# Patient Record
Sex: Female | Born: 1935
Health system: Southern US, Community
[De-identification: ages and names within clinical notes are randomized; demographics above are authoritative.]

## PROBLEM LIST (undated history)

## (undated) DIAGNOSIS — E039 Hypothyroidism, unspecified: Secondary | ICD-10-CM

## (undated) DIAGNOSIS — M1990 Unspecified osteoarthritis, unspecified site: Secondary | ICD-10-CM

## (undated) DIAGNOSIS — Z9889 Other specified postprocedural states: Secondary | ICD-10-CM

## (undated) DIAGNOSIS — I4891 Unspecified atrial fibrillation: Secondary | ICD-10-CM

## (undated) DIAGNOSIS — Z87442 Personal history of urinary calculi: Secondary | ICD-10-CM

## (undated) DIAGNOSIS — R112 Nausea with vomiting, unspecified: Secondary | ICD-10-CM

## (undated) DIAGNOSIS — I219 Acute myocardial infarction, unspecified: Secondary | ICD-10-CM

## (undated) HISTORY — PX: EYE SURGERY: SHX253

## (undated) HISTORY — PX: CATARACT EXTRACTION, BILATERAL: SHX1313

## (undated) HISTORY — PX: CHOLECYSTECTOMY: SHX55

## (undated) HISTORY — PX: TONSILLECTOMY: SUR1361

---

## 1982-10-26 HISTORY — PX: BREAST EXCISIONAL BIOPSY: SUR124

## 2005-04-02 ENCOUNTER — Ambulatory Visit: Payer: Self-pay | Admitting: Family Medicine

## 2005-12-02 ENCOUNTER — Ambulatory Visit: Payer: Self-pay | Admitting: Ophthalmology

## 2006-01-06 ENCOUNTER — Ambulatory Visit: Payer: Self-pay | Admitting: Ophthalmology

## 2006-07-19 ENCOUNTER — Ambulatory Visit: Payer: Self-pay | Admitting: Family Medicine

## 2007-09-05 ENCOUNTER — Ambulatory Visit: Payer: Self-pay | Admitting: Family Medicine

## 2008-09-14 ENCOUNTER — Ambulatory Visit: Payer: Self-pay

## 2008-09-17 ENCOUNTER — Ambulatory Visit: Payer: Self-pay | Admitting: Family Medicine

## 2008-10-26 HISTORY — PX: ROTATOR CUFF REPAIR: SHX139

## 2008-11-09 ENCOUNTER — Ambulatory Visit: Payer: Self-pay | Admitting: General Practice

## 2008-11-16 ENCOUNTER — Ambulatory Visit: Payer: Self-pay | Admitting: General Practice

## 2009-08-13 ENCOUNTER — Ambulatory Visit: Payer: Self-pay | Admitting: Ophthalmology

## 2009-08-26 ENCOUNTER — Ambulatory Visit: Payer: Self-pay | Admitting: Internal Medicine

## 2009-09-10 ENCOUNTER — Ambulatory Visit: Payer: Self-pay | Admitting: Internal Medicine

## 2009-09-23 ENCOUNTER — Ambulatory Visit: Payer: Self-pay | Admitting: Family Medicine

## 2009-09-25 ENCOUNTER — Ambulatory Visit: Payer: Self-pay | Admitting: Internal Medicine

## 2010-03-11 ENCOUNTER — Ambulatory Visit: Payer: Self-pay | Admitting: Family Medicine

## 2010-10-09 ENCOUNTER — Ambulatory Visit: Payer: Self-pay | Admitting: Family Medicine

## 2011-10-29 ENCOUNTER — Ambulatory Visit: Payer: Self-pay | Admitting: Family Medicine

## 2011-10-30 ENCOUNTER — Ambulatory Visit: Payer: Self-pay | Admitting: Family Medicine

## 2012-11-09 ENCOUNTER — Ambulatory Visit: Payer: Self-pay | Admitting: Family Medicine

## 2012-12-12 ENCOUNTER — Ambulatory Visit: Payer: Self-pay | Admitting: General Practice

## 2012-12-12 ENCOUNTER — Other Ambulatory Visit: Payer: Self-pay | Admitting: Anesthesiology

## 2012-12-12 LAB — CBC
HCT: 36 % (ref 35.0–47.0)
MCV: 90 fL (ref 80–100)
RBC: 3.98 10*6/uL (ref 3.80–5.20)
RDW: 13 % (ref 11.5–14.5)
WBC: 5.6 10*3/uL (ref 3.6–11.0)

## 2012-12-12 LAB — BASIC METABOLIC PANEL
Creatinine: 0.5 mg/dL — ABNORMAL LOW (ref 0.60–1.30)
EGFR (Non-African Amer.): >60
Glucose: 90 mg/dL (ref 65–99)

## 2012-12-12 LAB — SEDIMENTATION RATE: Erythrocyte Sed Rate: 16 mm/hr (ref 0–30)

## 2012-12-12 LAB — URINALYSIS, COMPLETE
Bacteria: NONE SEEN
Bilirubin,UR: NEGATIVE
Glucose,UR: NEGATIVE mg/dL (ref 0–75)
Ketone: NEGATIVE
Protein: NEGATIVE
Squamous Epithelial: <1
WBC UR: 2 /HPF (ref 0–5)

## 2012-12-12 LAB — APTT: Activated PTT: 32.3 secs (ref 23.6–35.9)

## 2012-12-12 LAB — PROTIME-INR: Prothrombin Time: 13.3 secs (ref 11.5–14.7)

## 2012-12-14 LAB — URINE CULTURE

## 2012-12-24 HISTORY — PX: JOINT REPLACEMENT: SHX530

## 2012-12-28 ENCOUNTER — Inpatient Hospital Stay: Payer: Self-pay | Admitting: General Practice

## 2012-12-29 LAB — BASIC METABOLIC PANEL
BUN: 10 mg/dL (ref 7–18)
Co2: 29 mmol/L (ref 21–32)
Creatinine: 0.67 mg/dL (ref 0.60–1.30)
EGFR (African American): >60
EGFR (Non-African Amer.): >60
Glucose: 115 mg/dL — ABNORMAL HIGH (ref 65–99)
Osmolality: 281 (ref 275–301)
Potassium: 3.9 mmol/L (ref 3.5–5.1)

## 2012-12-30 LAB — BASIC METABOLIC PANEL
Calcium, Total: 7.9 mg/dL — ABNORMAL LOW (ref 8.5–10.1)
Chloride: 105 mmol/L (ref 98–107)
Co2: 22 mmol/L (ref 21–32)
EGFR (African American): >60
Glucose: 84 mg/dL (ref 65–99)
Potassium: 3.1 mmol/L — ABNORMAL LOW (ref 3.5–5.1)

## 2012-12-30 LAB — PLATELET COUNT: Platelet: 106 10*3/uL — ABNORMAL LOW (ref 150–440)

## 2012-12-31 LAB — BASIC METABOLIC PANEL
BUN: 7 mg/dL (ref 7–18)
EGFR (African American): >60
EGFR (Non-African Amer.): >60
Glucose: 97 mg/dL (ref 65–99)
Osmolality: 275 (ref 275–301)
Potassium: 3.9 mmol/L (ref 3.5–5.1)
Sodium: 139 mmol/L (ref 136–145)

## 2013-01-02 ENCOUNTER — Encounter: Payer: Self-pay | Admitting: Internal Medicine

## 2013-11-10 ENCOUNTER — Ambulatory Visit: Payer: Self-pay | Admitting: Family Medicine

## 2014-03-06 DIAGNOSIS — E785 Hyperlipidemia, unspecified: Secondary | ICD-10-CM | POA: Insufficient documentation

## 2014-03-06 DIAGNOSIS — G2581 Restless legs syndrome: Secondary | ICD-10-CM | POA: Insufficient documentation

## 2014-11-19 ENCOUNTER — Ambulatory Visit: Payer: Self-pay | Admitting: Family Medicine

## 2014-12-11 ENCOUNTER — Emergency Department: Payer: Self-pay | Admitting: Emergency Medicine

## 2015-02-15 NOTE — Op Note (Signed)
PATIENT NAME:  Maria Trujillo, Maria Trujillo MR#:  [id number] DATE OF BIRTH:  1934/05/01  DATE OF PROCEDURE:  12/28/2012  PREOPERATIVE DIAGNOSIS: Degenerative arthrosis of the left knee.   POSTOPERATIVE DIAGNOSIS: Degenerative arthrosis of the left knee.   PROCEDURE PERFORMED: Left total knee arthroplasty using computer-assisted navigation.   SURGEON: Laurice Record. Hooten, MD.   ASSISTANTVance Peper, PA.   ANESTHESIA: Femoral nerve block and spinal.   ESTIMATED BLOOD LOSS: 100 mL.   FLUIDS REPLACED: 2200 mL of crystalloid.   DRAINS: Two medium drains to reinfusion system.   TOURNIQUET TIME: 97 minutes.   SOFT TISSUE RELEASES: Anterior cruciate ligament, posterior cruciate ligament, deep medial collateral ligament, patellofemoral ligament.   IMPLANTS UTILIZED: DePuy PFC Sigma size 4N (narrow) posterior stabilized femoral component (cemented), size 3 MBT tibial component (cemented), 35 mm 3-peg oval dome patella (cemented), and a 10 mm stabilized rotating platform polyethylene insert.   INDICATIONS FOR SURGERY: The patient is a 81 year old female who has been seen for complaints of progressive left knee pain. X-rays demonstrated severe degenerative changes in tricompartmental fashion with relative varus deformity. After discussion of the risks and benefits of surgical intervention, the patient expressed her understanding of the risks and benefits and agreed with plans for surgical intervention.   PROCEDURE IN DETAIL: The patient was brought into the Operating Room and after adequate femoral nerve block and spinal anesthesia was achieved, a tourniquet was placed on the patient's upper left thigh. The patient's left knee and leg were cleaned and prepped with alcohol and DuraPrep and draped in the usual sterile fashion. A "timeout" was performed as per usual protocol. The left lower extremity was exsanguinated using an Esmarch and the tourniquet was inflated to 300 mmHg. An anterior longitudinal incision was made  followed by a standard mid vastus approach. A moderate effusion was evacuated. The deep fibers of the medial collateral ligament were elevated in a subperiosteal fashion off the medial flare of the tibia so as to maintain a continuous soft tissue sleeve. The patella was subluxed laterally and the patellofemoral ligament was incised. Inspection of the knee demonstrated severe degenerative changes with full-thickness loss of articular cartilage, most notably to the medial compartment. Prominent osteophytes were debrided using a rongeur. Anterior and posterior cruciate ligaments were excised. Two 4.0 mm Schanz pins were inserted into the femur and into the tibia for attachment of the array of trackers used for computer-assisted navigation. Hip center was identified using a circumduction technique. Distal landmarks were mapped using the computer. The distal femur and proximal tibia were mapped using the computer. Distal femoral cutting guide was positioned using computer-assisted navigation so as to achieve a 5 degree distal valgus cut. Cut was performed and verified using the computer. The distal femur was sized and it was felt that a size 4N (narrow) femoral component was appropriate. A size 4 cutting guide was positioned and anterior cut was performed and verified using the computer. This was followed by completion of the posterior and chamfer cuts. Femoral cutting guide for central box was then positioned and the central box cut was performed.   Attention was then directed to the proximal tibia. Medial and lateral menisci were excised. The extramedullary tibial cutting guide was positioned using computer-assisted navigation so as to achieve 0 degree varus-valgus alignment and 0 degree posterior slope. Cut was performed and verified using the computer. The proximal tibia was sized and it was felt that a size 3 tibial tray was appropriate. Tibial and femoral trials were inserted  followed by insertion of a 10 mm  polyethylene insert. Excellent medial and lateral soft tissue balancing was appreciated both in extension and in flexion. Finally, the patella was cut and prepared so as to accommodate a 35 mm 3-peg oval dome patella. Patellar trial was placed and the knee was placed through a range of motion with excellent patellar tracking appreciated.   The femoral trial was removed after removal of posterior osteophytes. Central post hole for the tibial component was reamed followed by insertion of a keel punch. Tibial trial was then removed. Cut surfaces of bone were irrigated with copious amounts of normal saline with antibiotic solution using pulsatile lavage and then suctioned dry. Polymethylmethacrylate cement was prepared in the usual fashion using a vacuum mixer. Cement was applied to the cut surface of the proximal tibia as well as along the undersurface of a size 3 MBT tibial component. The tibial component was positioned and impacted into place. Excess cement was removed using [employer] fast streamer. Cement was then applied to the cut surface of the femur as well as on the posterior flanges of a size 4N (narrow) posterior stabilized femoral component. Femoral component was positioned and impacted into place. Excess cement was removed using [employer] fast streamer. A 10 mm polyethylene trial was inserted and the knee was brought into full extension with steady axial compression applied. Finally, cement was applied to the backside of a 35 mm 3- peg oval dome patella and the patellar component was positioned and patellar clamp applied. Excess cement was removed using [employer] fast streamer.   After adequate curing of cement, the tourniquet was deflated after a total tourniquet time of 97 minutes. Hemostasis was achieved using electrocautery. The knee was irrigated with copious amounts of normal saline with antibiotic solution using pulsatile lavage and then suctioned dry. The knee was inspected for any residual cement debris. Then, 30 mL  of 0.25% Marcaine with epinephrine was injected along the posterior capsule. A 10 mm stabilized rotating platform polyethylene insert was inserted and the knee was placed through a range of motion. Excellent patellar tracking was appreciated. Excellent medial and lateral soft tissue balance was appreciated. Two medium drains were placed in the wound bed and brought out through a separate stab incision to be attached to a reinfusion system. The medial parapatellar portion of the incision was reapproximated using interrupted sutures of #1 Vicryl. The subcutaneous tissue was reapproximated in layers using first #0 Vicryl followed by 2-0 Vicryl. The skin was closed with skin staples. A sterile dressing was applied.   The patient tolerated the procedure well. She was transported to the recovery room in stable condition.  ____________________________ Laurice Record. Holley Bouche., MD [email or web address] D: 12/28/2012 16:27:00 ET T: 12/28/2012 19:11:11 ET JOB#: [id number]  cc: Jeneen Rinks P. Holley Bouche., MD, <Dictator> Laurice Record Holley Bouche MD ELECTRONICALLY SIGNED 01/03/2013 18:22

## 2015-02-15 NOTE — Discharge Summary (Signed)
PATIENT NAME:  Maria Trujillo, Maria Trujillo MR#:  [id number] DATE OF BIRTH:  Feb 11, 1934  Dictated for Skip Estimable, MD  DATE OF ADMISSION:  12/28/2012 DATE OF DISCHARGE:  01/02/2013  ADMITTING DIAGNOSIS: Degenerative arthrosis of the left knee.   DISCHARGE DIAGNOSIS: Degenerative arthrosis of the left knee.   HISTORY: The patient is a very pleasant 81 year old female who has been followed at the [facility] for progression of left knee pain. The patient reported a relatively long history of progressive left knee and leg pain. She did not recall any specific trauma or aggravating event. She states the pain had become worse over the course of the last year. Her pain was noted to be aggravated with weightbearing activities, specifically with walking on uneven ground. The patient states that she saw some temporary improvement with her symptoms with the use of the a 6-day steroid Dosepak, but the pain returned after completion of the steroids. She had not seen any significant improvement in her condition despite nonsteroidal anti-inflammatory medications. The patient also reports swelling of the knee, as well as some near giving-way. She was having difficulty with ascending and descending stairs as well as getting up from a sitting position. At the time of surgery, she was not using any ambulatory aids. The patient states that the pain had increased to the point that it was significantly interfering with her activities of daily living. X-rays taken in [facility] showed narrowing in the medial cartilage space, with associated varus alignment. She was noted to have subchondral sclerosis, as well as osteophyte formation. After discussion of the risks and benefits of surgical intervention the patient expressed her understanding of the risks and benefits and agreed for plans for surgical intervention.   HOSPITAL COURSE:   PROCEDURE: Left total knee arthroplasty using computer-assisted navigation.    ANESTHESIA: Femoral nerve block, with spinal.   Soft tissue release: Anterior cruciate ligament, posterior cruciate ligament, deep medial collateral ligaments, as well as the patellofemoral ligament.   IMPLANTS UTILIZED: DePuy PFC Sigma size 4 narrow, posterior-stabilized femoral component (cemented), size 3 MBT tibial component (cemented), 35-mm 3-pegged oval dome patella (cemented), and a 10-mm  stabilized rotating platform polyethylene insert.   The patient tolerated the procedure very well. She had no complications. She was then taken to the PACU where she was stabilized, and then transferred to the orthopedic floor. The patient began receiving anticoagulation therapy of Lovenox 30 mg subQ q. 12 hours per anesthesia and pharmacy protocol. She was fitted with TED stockings bilaterally. These were allowed to be removed 1 hour per 8 hour shift. The left one was applied on day two following removal of the Hemovac and dressing change. The patient was also fitted with the AVI compression foot pumps to the bilateral feet, with pressure set at 80 mmHg. Her calves have been nontender. There has been no evidence of any DVTs. Negative Homan sign. Her heels were elevated off the bed using rolled towels.   The patient has denied any chest pain or any shortness of breath. Vital signs have been stable. She has been afebrile. Hemodynamically she was stable. No transfusions were given other than the Autovac transfusions given the first six hours postoperatively.   Physical therapy was initiated on day one for gait training and transfers. She has done extremely well. She is being discharged to a skilled nursing facility. Occupational therapy was also initiated on day one for ADLs and assistive devices.   The patient's IV, Foley and Hemovac were discontinued on  day 2. The wound was free of any drainage or signs of infection. Polar Care was reapplied to the surgical leg, maintaining a temperature of 40 to 50 degrees  Fahrenheit.   DRUG ALLERGIES: ALPHAGAN, AUGMENTIN, CELEBREX, PROPINE, SULFA DRUGS, TIMOPTIC, TRUSOPT, SHELLFISH.   MEDICATIONS:  Lumigan 0.01% optic solution, 1 drop, both eyes at bedtime, vitamin D 200 units, one tablet daily, Refresh optic drops 1 drop both eyes daily, vitamin D3 1000 units daily, Senokot-S  1 tablet twice daily, Synthroid 0.1 mg daily, multivitamin capsule one per day, omega-3 fatty acids 1 gram capsule daily, potassium chloride ER 40 mEq twice daily,  Zantac 150 mg twice daily, vitamin B complex with iron and intrinsic factor, 1 capsule twice daily with meals, Lovenox 30 mg subQ q. 12 hours for 14 days, then discontinue and begin taking one 81 mg enteric-coated aspirin, Tylenol ES 500 to 1000 mg q. 4  hours p.r.n. for pain and temperatures greater than 100.4, Norco 5/325, 1 to 2 tablets q. 4 to 6 hours as needed, for pain, Mylanta DS  30 mL q. 6 h., Dulcolax suppositories 10 mg rectally daily, as needed,  Zyrtec 10 mg daily,  Enema soapsuds if no results with Milk of Magnesia or Dulcolax.   PAST MEDICAL HISTORY: Hyperthyroidism, glaucoma, cataracts bilateral eyes, osteoporosis, anemia, fibrocystic breast disease.     ____________________________ Vance Peper, PA [email or web address] D: 01/02/2013 07:28:00 ET T: 01/02/2013 08:41:03 ET JOB#: [id number]  cc: Vance Peper, PA, <Dictator> JON WOLFE PA ELECTRONICALLY SIGNED 01/10/2013 20:11

## 2015-12-31 DIAGNOSIS — Z96652 Presence of left artificial knee joint: Secondary | ICD-10-CM | POA: Diagnosis not present

## 2016-02-14 DIAGNOSIS — H401132 Primary open-angle glaucoma, bilateral, moderate stage: Secondary | ICD-10-CM | POA: Diagnosis not present

## 2016-03-13 DIAGNOSIS — H401132 Primary open-angle glaucoma, bilateral, moderate stage: Secondary | ICD-10-CM | POA: Diagnosis not present

## 2016-04-17 ENCOUNTER — Other Ambulatory Visit: Payer: Self-pay | Admitting: Family Medicine

## 2016-04-17 DIAGNOSIS — G2581 Restless legs syndrome: Secondary | ICD-10-CM | POA: Diagnosis not present

## 2016-04-17 DIAGNOSIS — K219 Gastro-esophageal reflux disease without esophagitis: Secondary | ICD-10-CM | POA: Diagnosis not present

## 2016-04-17 DIAGNOSIS — Z1231 Encounter for screening mammogram for malignant neoplasm of breast: Secondary | ICD-10-CM

## 2016-04-17 DIAGNOSIS — E039 Hypothyroidism, unspecified: Secondary | ICD-10-CM | POA: Diagnosis not present

## 2016-04-17 DIAGNOSIS — J301 Allergic rhinitis due to pollen: Secondary | ICD-10-CM | POA: Diagnosis not present

## 2016-04-17 DIAGNOSIS — E7800 Pure hypercholesterolemia, unspecified: Secondary | ICD-10-CM | POA: Diagnosis not present

## 2016-05-11 ENCOUNTER — Ambulatory Visit: Payer: Self-pay

## 2016-05-26 ENCOUNTER — Ambulatory Visit
Admission: RE | Admit: 2016-05-26 | Discharge: 2016-05-26 | Disposition: A | Discharge status: Home / Self Care | Payer: PPO | Source: Ambulatory Visit | Attending: Family Medicine | Admitting: Family Medicine

## 2016-05-26 DIAGNOSIS — R928 Other abnormal and inconclusive findings on diagnostic imaging of breast: Secondary | ICD-10-CM | POA: Diagnosis not present

## 2016-05-26 DIAGNOSIS — Z1231 Encounter for screening mammogram for malignant neoplasm of breast: Secondary | ICD-10-CM | POA: Diagnosis not present

## 2016-05-29 ENCOUNTER — Other Ambulatory Visit: Payer: Self-pay | Admitting: Family Medicine

## 2016-05-29 DIAGNOSIS — N6320 Unspecified lump in the left breast, unspecified quadrant: Secondary | ICD-10-CM

## 2016-06-02 ENCOUNTER — Ambulatory Visit
Admission: RE | Admit: 2016-06-02 | Discharge: 2016-06-02 | Disposition: A | Discharge status: Home / Self Care | Payer: PPO | Source: Ambulatory Visit | Attending: Family Medicine | Admitting: Family Medicine

## 2016-06-02 DIAGNOSIS — N6320 Unspecified lump in the left breast, unspecified quadrant: Secondary | ICD-10-CM

## 2016-06-02 DIAGNOSIS — N6002 Solitary cyst of left breast: Secondary | ICD-10-CM | POA: Insufficient documentation

## 2016-06-02 DIAGNOSIS — R928 Other abnormal and inconclusive findings on diagnostic imaging of breast: Secondary | ICD-10-CM | POA: Diagnosis not present

## 2016-06-02 DIAGNOSIS — N63 Unspecified lump in breast: Secondary | ICD-10-CM | POA: Diagnosis not present

## 2016-06-24 DIAGNOSIS — Z08 Encounter for follow-up examination after completed treatment for malignant neoplasm: Secondary | ICD-10-CM | POA: Diagnosis not present

## 2016-06-24 DIAGNOSIS — L821 Other seborrheic keratosis: Secondary | ICD-10-CM | POA: Diagnosis not present

## 2016-06-24 DIAGNOSIS — Z85828 Personal history of other malignant neoplasm of skin: Secondary | ICD-10-CM | POA: Diagnosis not present

## 2016-07-14 DIAGNOSIS — H353131 Nonexudative age-related macular degeneration, bilateral, early dry stage: Secondary | ICD-10-CM | POA: Diagnosis not present

## 2016-07-17 DIAGNOSIS — Z23 Encounter for immunization: Secondary | ICD-10-CM | POA: Diagnosis not present

## 2016-10-27 DIAGNOSIS — G2581 Restless legs syndrome: Secondary | ICD-10-CM | POA: Diagnosis not present

## 2016-10-27 DIAGNOSIS — K219 Gastro-esophageal reflux disease without esophagitis: Secondary | ICD-10-CM | POA: Diagnosis not present

## 2016-10-27 DIAGNOSIS — E039 Hypothyroidism, unspecified: Secondary | ICD-10-CM | POA: Diagnosis not present

## 2016-10-27 DIAGNOSIS — E7800 Pure hypercholesterolemia, unspecified: Secondary | ICD-10-CM | POA: Diagnosis not present

## 2016-10-27 DIAGNOSIS — J301 Allergic rhinitis due to pollen: Secondary | ICD-10-CM | POA: Diagnosis not present

## 2016-10-27 DIAGNOSIS — M1711 Unilateral primary osteoarthritis, right knee: Secondary | ICD-10-CM | POA: Diagnosis not present

## 2017-01-05 DIAGNOSIS — H401132 Primary open-angle glaucoma, bilateral, moderate stage: Secondary | ICD-10-CM | POA: Diagnosis not present

## 2017-01-07 DIAGNOSIS — Z96652 Presence of left artificial knee joint: Secondary | ICD-10-CM | POA: Diagnosis not present

## 2017-01-07 DIAGNOSIS — M1711 Unilateral primary osteoarthritis, right knee: Secondary | ICD-10-CM | POA: Diagnosis not present

## 2017-01-12 DIAGNOSIS — H401132 Primary open-angle glaucoma, bilateral, moderate stage: Secondary | ICD-10-CM | POA: Diagnosis not present

## 2017-02-02 ENCOUNTER — Encounter
Admission: RE | Admit: 2017-02-02 | Discharge: 2017-02-02 | Disposition: A | Discharge status: Home / Self Care | Payer: PPO | Source: Ambulatory Visit | Attending: Orthopedic Surgery | Admitting: Orthopedic Surgery

## 2017-02-02 DIAGNOSIS — E039 Hypothyroidism, unspecified: Secondary | ICD-10-CM | POA: Diagnosis not present

## 2017-02-02 DIAGNOSIS — Z880 Allergy status to penicillin: Secondary | ICD-10-CM | POA: Insufficient documentation

## 2017-02-02 DIAGNOSIS — Z01810 Encounter for preprocedural cardiovascular examination: Secondary | ICD-10-CM | POA: Diagnosis not present

## 2017-02-02 DIAGNOSIS — R001 Bradycardia, unspecified: Secondary | ICD-10-CM | POA: Diagnosis not present

## 2017-02-02 DIAGNOSIS — Z882 Allergy status to sulfonamides status: Secondary | ICD-10-CM | POA: Insufficient documentation

## 2017-02-02 DIAGNOSIS — M1990 Unspecified osteoarthritis, unspecified site: Secondary | ICD-10-CM | POA: Diagnosis not present

## 2017-02-02 DIAGNOSIS — Z01812 Encounter for preprocedural laboratory examination: Secondary | ICD-10-CM | POA: Diagnosis not present

## 2017-02-02 HISTORY — DX: Hypothyroidism, unspecified: E03.9

## 2017-02-02 HISTORY — DX: Unspecified osteoarthritis, unspecified site: M19.90

## 2017-02-02 HISTORY — DX: Personal history of urinary calculi: Z87.442

## 2017-02-02 LAB — PROTIME-INR
INR: 1.06
Prothrombin Time: 13.8 seconds (ref 11.4–15.2)

## 2017-02-02 LAB — CBC
HCT: 38 % (ref 35.0–47.0)
Hemoglobin: 12.7 g/dL (ref 12.0–16.0)
MCH: 29.8 pg (ref 26.0–34.0)
MCHC: 33.4 g/dL (ref 32.0–36.0)
MCV: 89.4 fL (ref 80.0–100.0)
PLATELETS: 171 10*3/uL (ref 150–440)
RBC: 4.25 MIL/uL (ref 3.80–5.20)
RDW: 13 % (ref 11.5–14.5)
WBC: 5.3 10*3/uL (ref 3.6–11.0)

## 2017-02-02 LAB — COMPREHENSIVE METABOLIC PANEL
ALT: 19 U/L (ref 14–54)
ANION GAP: 5 (ref 5–15)
AST: 25 U/L (ref 15–41)
Albumin: 4.1 g/dL (ref 3.5–5.0)
Alkaline Phosphatase: 71 U/L (ref 38–126)
BILIRUBIN TOTAL: 0.7 mg/dL (ref 0.3–1.2)
BUN: 12 mg/dL (ref 6–20)
CHLORIDE: 106 mmol/L (ref 101–111)
CO2: 30 mmol/L (ref 22–32)
Calcium: 10 mg/dL (ref 8.9–10.3)
Creatinine, Ser: 0.51 mg/dL (ref 0.44–1.00)
GFR calc Af Amer: >60 mL/min (ref >60)
GFR calc non Af Amer: >60 mL/min (ref >60)
GLUCOSE: 90 mg/dL (ref 65–99)
POTASSIUM: 4 mmol/L (ref 3.5–5.1)
SODIUM: 141 mmol/L (ref 135–145)
TOTAL PROTEIN: 7.4 g/dL (ref 6.5–8.1)

## 2017-02-02 LAB — TYPE AND SCREEN
ABO/RH(D): O POS
ANTIBODY SCREEN: NEGATIVE

## 2017-02-02 LAB — URINALYSIS, ROUTINE W REFLEX MICROSCOPIC
BILIRUBIN URINE: NEGATIVE
Glucose, UA: NEGATIVE mg/dL
HGB URINE DIPSTICK: NEGATIVE
KETONES UR: NEGATIVE mg/dL
Leukocytes, UA: NEGATIVE
Nitrite: NEGATIVE
PH: 6 (ref 5.0–8.0)
Protein, ur: NEGATIVE mg/dL
SPECIFIC GRAVITY, URINE: 1.008 (ref 1.005–1.030)

## 2017-02-02 LAB — SEDIMENTATION RATE: SED RATE: 29 mm/h (ref 0–30)

## 2017-02-02 LAB — C-REACTIVE PROTEIN: CRP: <0.8 mg/dL (ref <1.0)

## 2017-02-02 LAB — SURGICAL PCR SCREEN
MRSA, PCR: NEGATIVE
Staphylococcus aureus: NEGATIVE

## 2017-02-02 LAB — APTT: APTT: 33 s (ref 24–36)

## 2017-02-02 NOTE — Patient Instructions (Signed)
Your procedure is scheduled on: February 15, 2017 [facility] Su procedimiento est programado para: Report to [place] a: To find out your arrival time please call  [phone] BETWEEN 1PM AND 3PM ON Friday February 12, 2017 Remember: Instructions that are not followed completely may result in serious medical risk, up to and including death, or upon the discretion of your surgeon and anesthesiologist your surgery may need to be rescheduled.  Recuerde: Las instrucciones que no se siguen completamente Heritage manager en un riesgo de salud grave, incluyendo hasta la [place] o a discrecin de su cirujano y Environmental health practitioner, su ciruga se puede posponer.   ___X_ 1. Do not eat food or drink liquids after midnight. No gum chewing or hard candies.  No coma alimentos ni tome lquidos despus de la medianoche.  No mastique chicle ni caramelos  duros.     __X__ 2. No alcohol for 24 hours before or after surgery.    No tome alcohol durante las 24 horas antes ni despus de la [place].   __X__ 3. Bring all medications with you on the day of surgery if instructed.  BRING ANY NEW MEDICATIONS    Lleve todos los medicamentos con usted el da de su ciruga si se le ha indicado as.   _X___ 4. Notify your doctor if there is any change in your medical condition (cold, fever,                             infections).    Informe a su mdico si hay algn cambio en su condicin mdica (resfriado, fiebre, infecciones).   Do not wear jewelry, make-up, hairpins, clips or nail polish.  No use joyas, maquillajes, pinzas/ganchos para el cabello ni esmalte de uas.  Do not wear lotions, powders, or perfumes. You may NOT wear deodorant.  No use lociones, polvos o perfumes.  Puede usar desodorante.    Do not shave 48 hours prior to surgery. Men may shave face and neck.  No se afeite 48 horas antes de la [place].  Los hombres pueden [employer] cara y el cuello.   Do not bring valuables  to the hospital.   No lleve objetos [facility] is not responsible for any belongings or valuables.   no se hace responsable de ningn tipo de pertenencias u objetos de Geographical information systems officer.               Contacts, dentures or bridgework may not be worn into surgery.  Los lentes de [place], las dentaduras postizas o puentes no se pueden usar en la [place].  Leave your suitcase in the car. After surgery it may be brought to your room.  Deje su maleta en el auto.  Despus de la ciruga podr traerla a su habitacin.  For patients admitted to the hospital, discharge time is determined by your treatment team.  Para los pacientes que sean ingresados al hospital, el tiempo en el cual se le dar de alta es determinado por su                equipo de [place].   Patients discharged the day of surgery will not be allowed to drive home. A los pacientes que se les da de alta el mismo da de la ciruga no se les permitir conducir a Holiday representative.   Please read over the following fact sheets that you were  given: Por favor lea las siguientes hojas de informacin que le dieron:   MRSA EDUCATION AND CHG INSTRUCTIONS   _X___ Take these medicines the morning of surgery with A SIP OF WATER:          [employer] estas medicinas la maana de la ciruga con UN SORBO DE AGUA:  1. LEVOTHYROXINE  2. CLARITIN  3.   4.       5.  6.  ____ Fleet Enema (as directed)          Enema de Fleet (segn lo indicado)    _X___ Use CHG Soap as directed          Utilice el jabn de CHG segn lo indicado  ____ Use inhalers on the day of surgery          Use los inhaladores el da de la ciruga  ____ Stop metformin 2 days prior to surgery          Deje de tomar el metformin 2 das antes de la ciruga    ____ Take 1/2 of usual insulin dose the night before surgery and none on the morning of surgery           Tome la mitad de la dosis habitual de insulina la noche antes de la [place] y no tome nada en la maana  de la             ciruga  __X__ Stop Coumadin/Plavix/aspirin on February 10, 2017          Palomas de tomar el Coumadin/Plavix/aspirina el da:  __X__ Stop Anti-inflammatories on February 10, 2017 ALEVE, ADVIL, MOTRIN, IBUPROFEN, GOODY POWDER USE  TYLENOL ONLY           Deje de tomar antiinflamatorios el da:   __X__ Stop supplements until after surgery  February 10, 2017 - FISH OIL          Deje de tomar suplementos hasta despus de la ciruga  ____ Bring C-Pap to the hospital          [facility]

## 2017-02-03 LAB — URINE CULTURE
Culture: <10000 — AB
Special Requests: NORMAL

## 2017-02-12 NOTE — Pre-Procedure Instructions (Signed)
Faxed request to Dr. Clydell Hakim office for H&P. DOS 02/15/17.

## 2017-02-14 MED ORDER — TRANEXAMIC ACID 1000 MG/10ML IV SOLN
1000.0000 mg | INTRAVENOUS | Status: DC
  Filled 2017-02-14: qty 10

## 2017-02-14 MED ORDER — CLINDAMYCIN PHOSPHATE 900 MG/50ML IV SOLN: 900.0000 mg | INTRAVENOUS | Status: DC

## 2017-02-15 ENCOUNTER — Inpatient Hospital Stay: Payer: PPO | Admitting: Anesthesiology

## 2017-02-15 ENCOUNTER — Inpatient Hospital Stay
Admission: RE | Admit: 2017-02-15 | Discharge: 2017-02-17 | DRG: 470 | Disposition: A | Discharge status: Skilled Nursing Facility | Payer: PPO | Source: Ambulatory Visit | Attending: Orthopedic Surgery | Admitting: Orthopedic Surgery

## 2017-02-15 ENCOUNTER — Encounter: Payer: Self-pay | Admitting: Orthopedic Surgery

## 2017-02-15 ENCOUNTER — Inpatient Hospital Stay: Payer: PPO

## 2017-02-15 ENCOUNTER — Encounter
Admission: RE | Disposition: A | Discharge status: Skilled Nursing Facility | Payer: Self-pay | Source: Ambulatory Visit | Attending: Orthopedic Surgery

## 2017-02-15 DIAGNOSIS — E785 Hyperlipidemia, unspecified: Secondary | ICD-10-CM | POA: Diagnosis not present

## 2017-02-15 DIAGNOSIS — I10 Essential (primary) hypertension: Secondary | ICD-10-CM | POA: Insufficient documentation

## 2017-02-15 DIAGNOSIS — M6281 Muscle weakness (generalized): Secondary | ICD-10-CM | POA: Diagnosis not present

## 2017-02-15 DIAGNOSIS — Z87442 Personal history of urinary calculi: Secondary | ICD-10-CM

## 2017-02-15 DIAGNOSIS — H409 Unspecified glaucoma: Secondary | ICD-10-CM | POA: Diagnosis not present

## 2017-02-15 DIAGNOSIS — M25561 Pain in right knee: Secondary | ICD-10-CM | POA: Diagnosis not present

## 2017-02-15 DIAGNOSIS — M1711 Unilateral primary osteoarthritis, right knee: Principal | ICD-10-CM | POA: Diagnosis present

## 2017-02-15 DIAGNOSIS — Z7401 Bed confinement status: Secondary | ICD-10-CM | POA: Diagnosis not present

## 2017-02-15 DIAGNOSIS — R262 Difficulty in walking, not elsewhere classified: Secondary | ICD-10-CM | POA: Diagnosis not present

## 2017-02-15 DIAGNOSIS — E039 Hypothyroidism, unspecified: Secondary | ICD-10-CM | POA: Diagnosis present

## 2017-02-15 DIAGNOSIS — K219 Gastro-esophageal reflux disease without esophagitis: Secondary | ICD-10-CM | POA: Diagnosis present

## 2017-02-15 DIAGNOSIS — Z96651 Presence of right artificial knee joint: Secondary | ICD-10-CM

## 2017-02-15 DIAGNOSIS — Z96659 Presence of unspecified artificial knee joint: Secondary | ICD-10-CM

## 2017-02-15 DIAGNOSIS — J309 Allergic rhinitis, unspecified: Secondary | ICD-10-CM | POA: Diagnosis not present

## 2017-02-15 DIAGNOSIS — Z471 Aftercare following joint replacement surgery: Secondary | ICD-10-CM | POA: Diagnosis not present

## 2017-02-15 HISTORY — PX: KNEE ARTHROPLASTY: SHX992

## 2017-02-15 LAB — ABO/RH: ABO/RH(D): O POS

## 2017-02-15 SURGERY — ARTHROPLASTY, KNEE, TOTAL, USING IMAGELESS COMPUTER-ASSISTED NAVIGATION
Anesthesia: Spinal | Site: Knee | Laterality: Right | Wound class: Clean

## 2017-02-15 MED ORDER — PHENYLEPHRINE HCL 10 MG PO TABS
10.0000 mg | ORAL_TABLET | ORAL | Status: DC | PRN
  Filled 2017-02-15: qty 1

## 2017-02-15 MED ORDER — OMEGA-3-ACID ETHYL ESTERS 1 G PO CAPS
1000.0000 mg | ORAL_CAPSULE | Freq: Every day | ORAL | Status: DC
  Administered 2017-02-16: 1000 mg via ORAL
  Filled 2017-02-15 (×2): qty 1

## 2017-02-15 MED ORDER — BUPIVACAINE HCL (PF) 0.25 % IJ SOLN
INTRAMUSCULAR | Status: DC | PRN
  Administered 2017-02-15: 60 mL

## 2017-02-15 MED ORDER — PANTOPRAZOLE SODIUM 40 MG PO TBEC
40.0000 mg | DELAYED_RELEASE_TABLET | Freq: Two times a day (BID) | ORAL | Status: DC
  Administered 2017-02-15 – 2017-02-17 (×4): 40 mg via ORAL
  Filled 2017-02-15 (×4): qty 1

## 2017-02-15 MED ORDER — LIDOCAINE HCL (PF) 2 % IJ SOLN
INTRAMUSCULAR | Status: AC
  Filled 2017-02-15: qty 2

## 2017-02-15 MED ORDER — ACETAMINOPHEN 325 MG PO TABS
650.0000 mg | ORAL_TABLET | Freq: Four times a day (QID) | ORAL | Status: DC | PRN
  Administered 2017-02-17: 650 mg via ORAL
  Filled 2017-02-15: qty 2

## 2017-02-15 MED ORDER — BUPIVACAINE HCL (PF) 0.5 % IJ SOLN
INTRAMUSCULAR | Status: DC | PRN
  Administered 2017-02-15: 2 mL

## 2017-02-15 MED ORDER — ONDANSETRON HCL 4 MG/2ML IJ SOLN
INTRAMUSCULAR | Status: AC
  Filled 2017-02-15: qty 2

## 2017-02-15 MED ORDER — LEVOTHYROXINE SODIUM 100 MCG PO TABS
100.0000 ug | ORAL_TABLET | Freq: Every day | ORAL | Status: DC
  Administered 2017-02-16 – 2017-02-17 (×2): 100 ug via ORAL
  Filled 2017-02-15 (×2): qty 1

## 2017-02-15 MED ORDER — ACETAMINOPHEN 650 MG RE SUPP: 650.0000 mg | Freq: Four times a day (QID) | RECTAL | Status: DC | PRN

## 2017-02-15 MED ORDER — LORATADINE 10 MG PO TABS
10.0000 mg | ORAL_TABLET | Freq: Every day | ORAL | Status: DC
  Administered 2017-02-16 – 2017-02-17 (×2): 10 mg via ORAL
  Filled 2017-02-15 (×2): qty 1

## 2017-02-15 MED ORDER — PHENOL 1.4 % MT LIQD
1.0000 | OROMUCOSAL | Status: DC | PRN
  Filled 2017-02-15: qty 177

## 2017-02-15 MED ORDER — BISACODYL 10 MG RE SUPP: 10.0000 mg | Freq: Every day | RECTAL | Status: DC | PRN

## 2017-02-15 MED ORDER — BUPIVACAINE HCL (PF) 0.25 % IJ SOLN
INTRAMUSCULAR | Status: AC
  Filled 2017-02-15: qty 30

## 2017-02-15 MED ORDER — BUPIVACAINE LIPOSOME 1.3 % IJ SUSP
INTRAMUSCULAR | Status: AC
  Filled 2017-02-15: qty 20

## 2017-02-15 MED ORDER — CLINDAMYCIN PHOSPHATE 600 MG/50ML IV SOLN
600.0000 mg | Freq: Four times a day (QID) | INTRAVENOUS | Status: AC
  Administered 2017-02-15 – 2017-02-16 (×4): 600 mg via INTRAVENOUS
  Filled 2017-02-15 (×4): qty 50

## 2017-02-15 MED ORDER — SODIUM CHLORIDE 0.9 % IV SOLN
INTRAVENOUS | Status: DC | PRN
  Administered 2017-02-15: 60 mL

## 2017-02-15 MED ORDER — PROPOFOL 500 MG/50ML IV EMUL
INTRAVENOUS | Status: AC
  Filled 2017-02-15: qty 50

## 2017-02-15 MED ORDER — SENNOSIDES-DOCUSATE SODIUM 8.6-50 MG PO TABS
1.0000 | ORAL_TABLET | Freq: Two times a day (BID) | ORAL | Status: DC
  Administered 2017-02-15 – 2017-02-17 (×4): 1 via ORAL
  Filled 2017-02-15 (×4): qty 1

## 2017-02-15 MED ORDER — FAMOTIDINE 20 MG PO TABS
20.0000 mg | ORAL_TABLET | Freq: Once | ORAL | Status: AC
  Administered 2017-02-15: 20 mg via ORAL

## 2017-02-15 MED ORDER — ENOXAPARIN SODIUM 30 MG/0.3ML ~~LOC~~ SOLN
30.0000 mg | Freq: Two times a day (BID) | SUBCUTANEOUS | Status: DC
  Administered 2017-02-16 – 2017-02-17 (×3): 30 mg via SUBCUTANEOUS
  Filled 2017-02-15 (×3): qty 0.3

## 2017-02-15 MED ORDER — ONDANSETRON HCL 4 MG PO TABS: 4.0000 mg | ORAL_TABLET | Freq: Four times a day (QID) | ORAL | Status: DC | PRN

## 2017-02-15 MED ORDER — TRAMADOL HCL 50 MG PO TABS
50.0000 mg | ORAL_TABLET | ORAL | Status: DC | PRN
  Administered 2017-02-16 – 2017-02-17 (×6): 50 mg via ORAL
  Filled 2017-02-15 (×6): qty 1

## 2017-02-15 MED ORDER — MORPHINE SULFATE (PF) 2 MG/ML IV SOLN: 2.0000 mg | INTRAVENOUS | Status: DC | PRN

## 2017-02-15 MED ORDER — MAGNESIUM HYDROXIDE 400 MG/5ML PO SUSP
30.0000 mL | Freq: Every day | ORAL | Status: DC | PRN
  Administered 2017-02-16: 30 mL via ORAL
  Filled 2017-02-15: qty 30

## 2017-02-15 MED ORDER — TRANEXAMIC ACID 1000 MG/10ML IV SOLN
INTRAVENOUS | Status: DC | PRN
  Administered 2017-02-15: 1000 mg via INTRAVENOUS

## 2017-02-15 MED ORDER — NEOMYCIN-POLYMYXIN B GU 40-200000 IR SOLN
Status: DC | PRN
  Administered 2017-02-15: 14 mL

## 2017-02-15 MED ORDER — OCUVITE-LUTEIN PO CAPS
2.0000 | ORAL_CAPSULE | Freq: Two times a day (BID) | ORAL | Status: DC
  Administered 2017-02-15 – 2017-02-17 (×4): 2 via ORAL
  Filled 2017-02-15 (×5): qty 2

## 2017-02-15 MED ORDER — LATANOPROST 0.005 % OP SOLN
1.0000 [drp] | Freq: Every day | OPHTHALMIC | Status: DC
  Administered 2017-02-15 – 2017-02-16 (×2): 1 [drp] via OPHTHALMIC
  Filled 2017-02-15: qty 2.5

## 2017-02-15 MED ORDER — TETRACAINE HCL 1 % IJ SOLN
INTRAMUSCULAR | Status: DC | PRN
  Administered 2017-02-15: 10 mg via INTRASPINAL

## 2017-02-15 MED ORDER — MENTHOL 3 MG MT LOZG
1.0000 | LOZENGE | OROMUCOSAL | Status: DC | PRN
  Filled 2017-02-15: qty 9

## 2017-02-15 MED ORDER — CHLORHEXIDINE GLUCONATE 4 % EX LIQD: 60.0000 mL | Freq: Once | CUTANEOUS | Status: DC

## 2017-02-15 MED ORDER — NEOMYCIN-POLYMYXIN B GU 40-200000 IR SOLN
Status: AC
  Filled 2017-02-15: qty 20

## 2017-02-15 MED ORDER — ACETAMINOPHEN 10 MG/ML IV SOLN
INTRAVENOUS | Status: DC | PRN
  Administered 2017-02-15: 1000 mg via INTRAVENOUS

## 2017-02-15 MED ORDER — FENTANYL CITRATE (PF) 100 MCG/2ML IJ SOLN: 25.0000 ug | INTRAMUSCULAR | Status: DC | PRN

## 2017-02-15 MED ORDER — SODIUM CHLORIDE 0.9 % IJ SOLN
INTRAMUSCULAR | Status: AC
  Administered 2017-02-15: 17:00:00
  Filled 2017-02-15: qty 20

## 2017-02-15 MED ORDER — OXYCODONE HCL 5 MG PO TABS
5.0000 mg | ORAL_TABLET | ORAL | Status: DC | PRN
  Administered 2017-02-15: 5 mg via ORAL
  Filled 2017-02-15: qty 1

## 2017-02-15 MED ORDER — PHENYLEPHRINE HCL 10 MG/ML IJ SOLN
INTRAMUSCULAR | Status: DC | PRN
  Administered 2017-02-15: 30 ug/min via INTRAVENOUS

## 2017-02-15 MED ORDER — PROPOFOL 10 MG/ML IV BOLUS
INTRAVENOUS | Status: DC | PRN
  Administered 2017-02-15: 8 mg via INTRAVENOUS
  Administered 2017-02-15: 8.3 mg via INTRAVENOUS
  Administered 2017-02-15 (×3): 8 mg via INTRAVENOUS
  Administered 2017-02-15 (×2): 20 mg via INTRAVENOUS

## 2017-02-15 MED ORDER — FERROUS SULFATE 325 (65 FE) MG PO TABS
325.0000 mg | ORAL_TABLET | Freq: Two times a day (BID) | ORAL | Status: DC
  Administered 2017-02-16 – 2017-02-17 (×3): 325 mg via ORAL
  Filled 2017-02-15 (×3): qty 1

## 2017-02-15 MED ORDER — FENTANYL CITRATE (PF) 100 MCG/2ML IJ SOLN
INTRAMUSCULAR | Status: DC | PRN
  Administered 2017-02-15: 100 ug via INTRAVENOUS

## 2017-02-15 MED ORDER — CLINDAMYCIN PHOSPHATE 900 MG/50ML IV SOLN
INTRAVENOUS | Status: AC
  Filled 2017-02-15: qty 50

## 2017-02-15 MED ORDER — PROMETHAZINE HCL 25 MG/ML IJ SOLN
INTRAMUSCULAR | Status: AC
  Administered 2017-02-15: 6.25 mg via INTRAVENOUS
  Filled 2017-02-15: qty 1

## 2017-02-15 MED ORDER — DIPHENHYDRAMINE HCL 12.5 MG/5ML PO ELIX
12.5000 mg | ORAL_SOLUTION | ORAL | Status: DC | PRN
Start: 2017-02-15 — End: 2017-02-17

## 2017-02-15 MED ORDER — FAMOTIDINE 20 MG PO TABS
ORAL_TABLET | ORAL | Status: AC
  Administered 2017-02-15: 20 mg via ORAL
  Filled 2017-02-15: qty 1

## 2017-02-15 MED ORDER — ALUM & MAG HYDROXIDE-SIMETH 200-200-20 MG/5ML PO SUSP: 30.0000 mL | ORAL | Status: DC | PRN

## 2017-02-15 MED ORDER — ACETAMINOPHEN 10 MG/ML IV SOLN
1000.0000 mg | Freq: Four times a day (QID) | INTRAVENOUS | Status: AC
  Administered 2017-02-15 – 2017-02-16 (×4): 1000 mg via INTRAVENOUS
  Filled 2017-02-15 (×4): qty 100

## 2017-02-15 MED ORDER — TRANEXAMIC ACID 1000 MG/10ML IV SOLN
1000.0000 mg | Freq: Once | INTRAVENOUS | Status: AC
  Administered 2017-02-15: 1000 mg via INTRAVENOUS
  Filled 2017-02-15: qty 10

## 2017-02-15 MED ORDER — VITAMIN D 1000 UNITS PO TABS
1000.0000 [IU] | ORAL_TABLET | Freq: Every day | ORAL | Status: DC
  Administered 2017-02-16 – 2017-02-17 (×2): 1000 [IU] via ORAL
  Filled 2017-02-15 (×2): qty 1

## 2017-02-15 MED ORDER — ACETAMINOPHEN 10 MG/ML IV SOLN
INTRAVENOUS | Status: AC
  Filled 2017-02-15: qty 100

## 2017-02-15 MED ORDER — LACTATED RINGERS IV SOLN
INTRAVENOUS | Status: DC
  Administered 2017-02-15 (×2): via INTRAVENOUS

## 2017-02-15 MED ORDER — SODIUM CHLORIDE 0.9 % IV BOLUS (SEPSIS)
250.0000 mL | Freq: Once | INTRAVENOUS | Status: AC
  Administered 2017-02-15: 250 mL via INTRAVENOUS

## 2017-02-15 MED ORDER — FLEET ENEMA 7-19 GM/118ML RE ENEM: 1.0000 | ENEMA | Freq: Once | RECTAL | Status: DC | PRN

## 2017-02-15 MED ORDER — POLYVINYL ALCOHOL 1.4 % OP SOLN
1.0000 [drp] | Freq: Two times a day (BID) | OPHTHALMIC | Status: DC | PRN
  Filled 2017-02-15: qty 15

## 2017-02-15 MED ORDER — FENTANYL CITRATE (PF) 100 MCG/2ML IJ SOLN
INTRAMUSCULAR | Status: AC
  Filled 2017-02-15: qty 2

## 2017-02-15 MED ORDER — SODIUM CHLORIDE 0.9 % IV SOLN
INTRAVENOUS | Status: DC
  Administered 2017-02-16: 03:00:00 via INTRAVENOUS

## 2017-02-15 MED ORDER — ADULT MULTIVITAMIN W/MINERALS CH
1.0000 | ORAL_TABLET | Freq: Every day | ORAL | Status: DC
  Administered 2017-02-16 – 2017-02-17 (×2): 1 via ORAL
  Filled 2017-02-15 (×2): qty 1

## 2017-02-15 MED ORDER — CALCIUM CARBONATE-VITAMIN D 500-200 MG-UNIT PO TABS
1.0000 | ORAL_TABLET | Freq: Every day | ORAL | Status: DC
  Administered 2017-02-16 – 2017-02-17 (×2): 1 via ORAL
  Filled 2017-02-15 (×2): qty 1

## 2017-02-15 MED ORDER — CLINDAMYCIN PHOSPHATE 900 MG/50ML IV SOLN
INTRAVENOUS | Status: DC | PRN
  Administered 2017-02-15: 900 mg via INTRAVENOUS

## 2017-02-15 MED ORDER — KETOTIFEN FUMARATE 0.025 % OP SOLN
1.0000 [drp] | Freq: Two times a day (BID) | OPHTHALMIC | Status: DC | PRN
  Filled 2017-02-15: qty 5

## 2017-02-15 MED ORDER — ONDANSETRON HCL 4 MG/2ML IJ SOLN: 4.0000 mg | Freq: Four times a day (QID) | INTRAMUSCULAR | Status: DC | PRN

## 2017-02-15 MED ORDER — ONDANSETRON HCL 4 MG/2ML IJ SOLN
4.0000 mg | Freq: Once | INTRAMUSCULAR | Status: AC | PRN
  Administered 2017-02-15: 4 mg via INTRAVENOUS

## 2017-02-15 MED ORDER — SALINE SPRAY 0.65 % NA SOLN
1.0000 | NASAL | Status: DC | PRN
  Filled 2017-02-15: qty 44

## 2017-02-15 MED ORDER — PROPOFOL 500 MG/50ML IV EMUL
INTRAVENOUS | Status: DC | PRN
  Administered 2017-02-15: 75 ug/kg/min via INTRAVENOUS

## 2017-02-15 MED ORDER — METOCLOPRAMIDE HCL 10 MG PO TABS
10.0000 mg | ORAL_TABLET | Freq: Three times a day (TID) | ORAL | Status: DC
  Administered 2017-02-15 – 2017-02-17 (×6): 10 mg via ORAL
  Filled 2017-02-15 (×6): qty 1

## 2017-02-15 MED ORDER — PROMETHAZINE HCL 25 MG/ML IJ SOLN
6.2500 mg | Freq: Once | INTRAMUSCULAR | Status: AC
Start: 2017-02-15 — End: 2017-02-15
  Administered 2017-02-15: 6.25 mg via INTRAVENOUS

## 2017-02-15 MED ORDER — SODIUM CHLORIDE 0.9 % IJ SOLN
INTRAMUSCULAR | Status: AC
  Filled 2017-02-15: qty 50

## 2017-02-15 SURGICAL SUPPLY — 70 items
BATTERY INSTRU NAVIGATION (MISCELLANEOUS) ×12 IMPLANT
BLADE CLIPPER SURG (BLADE) ×3 IMPLANT
BLADE SAW 1 (BLADE) ×3 IMPLANT
BLADE SAW 1/2 (BLADE) ×3 IMPLANT
BLADE SAW 70X12.5 (BLADE) IMPLANT
BONE CEMENT GENTAMICIN (Cement) ×6 IMPLANT
CANISTER SUCT 1200ML W/VALVE (MISCELLANEOUS) ×3 IMPLANT
CANISTER SUCT 3000ML (MISCELLANEOUS) ×6 IMPLANT
CAP KNEE TOTAL 3 SIGMA ×3 IMPLANT
CATH TRAY METER 16FR LF (MISCELLANEOUS) ×3 IMPLANT
CEMENT BONE GENTAMICIN 40 (Cement) ×2 IMPLANT
CHLORAPREP W/TINT 26ML (MISCELLANEOUS) ×6 IMPLANT
COOLER POLAR GLACIER W/PUMP (MISCELLANEOUS) ×3 IMPLANT
CUFF TOURN 24 STER (MISCELLANEOUS) IMPLANT
CUFF TOURN 30 STER DUAL PORT (MISCELLANEOUS) ×3 IMPLANT
DECANTER SPIKE VIAL GLASS SM (MISCELLANEOUS) ×9 IMPLANT
DRAPE INCISE 23X17 IOBAN STRL (DRAPES) ×2
DRAPE INCISE IOBAN 23X17 STRL (DRAPES) ×1 IMPLANT
DRAPE SHEET LG 3/4 BI-LAMINATE (DRAPES) ×3 IMPLANT
DRSG DERMACEA 8X12 NADH (GAUZE/BANDAGES/DRESSINGS) ×3 IMPLANT
DRSG OPSITE POSTOP 4X14 (GAUZE/BANDAGES/DRESSINGS) ×3 IMPLANT
DRSG TEGADERM 4X4.75 (GAUZE/BANDAGES/DRESSINGS) ×3 IMPLANT
DURAPREP 26ML APPLICATOR (WOUND CARE) IMPLANT
ELECT CAUTERY BLADE 6.4 (BLADE) ×3 IMPLANT
ELECT REM PT RETURN 9FT ADLT (ELECTROSURGICAL) ×3
ELECTRODE REM PT RTRN 9FT ADLT (ELECTROSURGICAL) ×1 IMPLANT
EX-PIN ORTHOLOCK NAV 4X150 (PIN) ×6 IMPLANT
GLOVE BIOGEL M STRL SZ7.5 (GLOVE) ×12 IMPLANT
GLOVE BIOGEL PI IND STRL 9 (GLOVE) ×1 IMPLANT
GLOVE BIOGEL PI INDICATOR 9 (GLOVE) ×2
GLOVE INDICATOR 8.0 STRL GRN (GLOVE) ×9 IMPLANT
GLOVE SURG SYN 9.0  PF PI (GLOVE) ×6
GLOVE SURG SYN 9.0 PF PI (GLOVE) ×3 IMPLANT
GOWN STRL REUS W/ TWL LRG LVL3 (GOWN DISPOSABLE) ×3 IMPLANT
GOWN STRL REUS W/TWL 2XL LVL3 (GOWN DISPOSABLE) ×3 IMPLANT
GOWN STRL REUS W/TWL LRG LVL3 (GOWN DISPOSABLE) ×6
HEMOVAC 400CC 10FR (MISCELLANEOUS) ×3 IMPLANT
HOLDER FOLEY CATH W/STRAP (MISCELLANEOUS) ×3 IMPLANT
HOOD PEEL AWAY FLYTE STAYCOOL (MISCELLANEOUS) ×6 IMPLANT
KIT RM TURNOVER STRD PROC AR (KITS) ×3 IMPLANT
KNIFE SCULPS 14X20 (INSTRUMENTS) ×3 IMPLANT
LABEL OR SOLS (LABEL) ×3 IMPLANT
NDL SAFETY 18GX1.5 (NEEDLE) ×3 IMPLANT
NEEDLE SPNL 20GX3.5 QUINCKE YW (NEEDLE) ×3 IMPLANT
NS IRRIG 500ML POUR BTL (IV SOLUTION) ×3 IMPLANT
PACK TOTAL KNEE (MISCELLANEOUS) ×3 IMPLANT
PAD WRAPON POLAR KNEE (MISCELLANEOUS) ×1 IMPLANT
PIN DRILL QUICK PACK ×3 IMPLANT
PIN FIXATION 1/8DIA X 3INL (PIN) ×3 IMPLANT
PULSAVAC PLUS IRRIG FAN TIP (DISPOSABLE) ×3
SCRUB PCMX 4 OZ (MISCELLANEOUS) ×3 IMPLANT
SOL .9 NS 3000ML IRR  AL (IV SOLUTION) ×2
SOL .9 NS 3000ML IRR UROMATIC (IV SOLUTION) ×1 IMPLANT
SOL PREP PVP 2OZ (MISCELLANEOUS)
SOLUTION PREP PVP 2OZ (MISCELLANEOUS) IMPLANT
SPONGE DRAIN TRACH 4X4 STRL 2S (GAUZE/BANDAGES/DRESSINGS) ×3 IMPLANT
SPONGE LAP 18X18 5 PK (GAUZE/BANDAGES/DRESSINGS) ×3 IMPLANT
STAPLER SKIN PROX 35W (STAPLE) ×3 IMPLANT
STRAP TIBIA SHORT (MISCELLANEOUS) ×2 IMPLANT
SUCTION FRAZIER HANDLE 10FR (MISCELLANEOUS) ×2
SUCTION TUBE FRAZIER 10FR DISP (MISCELLANEOUS) ×1 IMPLANT
SUT VIC AB 0 CT1 36 (SUTURE) ×3 IMPLANT
SUT VIC AB 1 CT1 36 (SUTURE) ×6 IMPLANT
SUT VIC AB 2-0 CT2 27 (SUTURE) ×6 IMPLANT
SYR 20CC LL (SYRINGE) ×3 IMPLANT
SYR 30ML LL (SYRINGE) ×6 IMPLANT
TIP FAN IRRIG PULSAVAC PLUS (DISPOSABLE) ×1 IMPLANT
TOWEL OR 17X26 4PK STRL BLUE (TOWEL DISPOSABLE) ×3 IMPLANT
TOWER CARTRIDGE SMART MIX (DISPOSABLE) ×3 IMPLANT
WRAPON POLAR PAD KNEE (MISCELLANEOUS) ×3

## 2017-02-15 NOTE — Transfer of Care (Signed)
Immediate Anesthesia Transfer of Care Note  Patient: Maria Trujillo  Procedure(s) Performed: Procedure(s): COMPUTER ASSISTED TOTAL KNEE ARTHROPLASTY (Right)  Patient Location: PACU  Anesthesia Type:Spinal  Level of Consciousness: awake, alert  and oriented  Airway & Oxygen Therapy: Patient Spontanous Breathing and Patient connected to nasal cannula oxygen  Post-op Assessment: Report given to RN and Post -op Vital signs reviewed and stable  Post vital signs: Reviewed and stable  Last Vitals:  Vitals:   02/15/17 0926  BP: (!) 165/65  Pulse: 70  Resp: 20  Temp: 37 C    Last Pain:  Vitals:   02/15/17 0926  TempSrc: Tympanic  PainSc: 0-No pain         Complications: No apparent anesthesia complications

## 2017-02-15 NOTE — Anesthesia Preprocedure Evaluation (Addendum)
Anesthesia Evaluation  Patient identified by MRN, date of birth, ID band Patient awake    Reviewed: Allergy & Precautions, NPO status , Patient's Chart, lab work & pertinent test results, reviewed documented beta blocker date and time   Airway Mallampati: II  TM Distance: >3 FB Neck ROM: limited    Dental  (+) Chipped   Pulmonary           Cardiovascular hypertension,      Neuro/Psych    GI/Hepatic GERD  Controlled,  Endo/Other  Hypothyroidism   Renal/GU      Musculoskeletal  (+) Arthritis ,   Abdominal   Peds  Hematology   Anesthesia Other Findings Past Medical History: No date: Arthritis No date: History of kidney stones No date: Hypothyroidism  BMI    Body Mass Index:  31.41 kg/m  Severe overbite.     Reproductive/Obstetrics                           Anesthesia Physical Anesthesia Plan  ASA: III  Anesthesia Plan: Spinal   Post-op Pain Management:    Induction:   Airway Management Planned:   Additional Equipment:   Intra-op Plan:   Post-operative Plan:   Informed Consent: I have reviewed the patients History and Physical, chart, labs and discussed the procedure including the risks, benefits and alternatives for the proposed anesthesia with the patient or authorized representative who has indicated his/her understanding and acceptance.     Plan Discussed with: CRNA  Anesthesia Plan Comments:        Anesthesia Quick Evaluation

## 2017-02-15 NOTE — Anesthesia Procedure Notes (Signed)
Spinal  Patient location during procedure: OR Start time: 02/15/2017 11:35 AM End time: 02/15/2017 11:39 AM Staffing Anesthesiologist: Katy Fitch K Performed: anesthesiologist  Preanesthetic Checklist Completed: patient identified, site marked, surgical consent, pre-op evaluation, timeout performed, IV checked, risks and benefits discussed and monitors and equipment checked Spinal Block Patient position: sitting Prep: ChloraPrep Patient monitoring: heart rate, continuous pulse ox, blood pressure and cardiac monitor Approach: midline Location: L4-5 Injection technique: single-shot Needle Needle type: Whitacre and Introducer  Needle gauge: 24 G Needle length: 9 cm Assessment Sensory level: T10 Additional Notes Negative paresthesia. Negative blood return. Positive free-flowing CSF. Expiration date of kit checked and confirmed. Patient tolerated procedure well, without complications.

## 2017-02-15 NOTE — Op Note (Signed)
OPERATIVE NOTE  DATE OF SURGERY:  02/15/2017  PATIENT NAME:  Maria Trujillo   DOB: 24-Apr-1934  MRN: [id number]  PRE-OPERATIVE DIAGNOSIS: Degenerative arthrosis of the right knee, primary  POST-OPERATIVE DIAGNOSIS:  Same  PROCEDURE:  Right total knee arthroplasty using computer-assisted navigation  SURGEON:  Marciano Sequin. M.D.  ASSISTANT:  Vance Peper, PA (present and scrubbed throughout the case, critical for assistance with exposure, retraction, instrumentation, and closure)  ANESTHESIA: spinal  ESTIMATED BLOOD LOSS: 100 mL  FLUIDS REPLACED: 1500 mL of crystalloid  TOURNIQUET TIME: 119 minutes  DRAINS: 2 medium drains to a reinfusion system  SOFT TISSUE RELEASES: Anterior cruciate ligament, posterior cruciate ligament, deep medial collateral ligament, patellofemoral ligament  IMPLANTS UTILIZED: DePuy PFC Sigma size 4N posterior stabilized femoral component (cemented), size 3 MBT tibial component (cemented), 35 mm 3 peg oval dome patella (cemented), and a 10 mm stabilized rotating platform polyethylene insert.  INDICATIONS FOR SURGERY: Maria Trujillo is a 83 y.o. year old female with a long history of progressive knee pain. X-rays demonstrated severe degenerative changes in tricompartmental fashion. The patient had not seen any significant improvement despite conservative nonsurgical intervention. After discussion of the risks and benefits of surgical intervention, the patient expressed understanding of the risks benefits and agree with plans for total knee arthroplasty.   The risks, benefits, and alternatives were discussed at length including but not limited to the risks of infection, bleeding, nerve injury, stiffness, blood clots, the need for revision surgery, cardiopulmonary complications, among others, and they were willing to proceed.  PROCEDURE IN DETAIL: The patient was brought into the operating room and, after adequate spinal anesthesia was achieved, a tourniquet was  placed on the patient's upper thigh. The patient's knee and leg were cleaned and prepped with alcohol and ChloraPrep and draped in the usual sterile fashion. A "timeout" was performed as per usual protocol. The lower extremity was exsanguinated using an Esmarch, and the tourniquet was inflated to 300 mmHg. An anterior longitudinal incision was made followed by a standard mid vastus approach. The deep fibers of the medial collateral ligament were elevated in a subperiosteal fashion off of the medial flare of the tibia so as to maintain a continuous soft tissue sleeve. The patella was subluxed laterally and the patellofemoral ligament was incised. Inspection of the knee demonstrated severe degenerative changes with full-thickness loss of articular cartilage. Osteophytes were debrided using a rongeur. Anterior and posterior cruciate ligaments were excised. Two 4.0 mm Schanz pins were inserted in the femur and into the tibia for attachment of the array of trackers used for computer-assisted navigation. Hip center was identified using a circumduction technique. Distal landmarks were mapped using the computer. The distal femur and proximal tibia were mapped using the computer. The distal femoral cutting guide was positioned using computer-assisted navigation so as to achieve a 5 distal valgus cut. The femur was sized and it was felt that a size 4N femoral component was appropriate. A size 4 femoral cutting guide was positioned and the anterior cut was performed and verified using the computer. This was followed by completion of the posterior and chamfer cuts. Femoral cutting guide for the central box was then positioned in the center box cut was performed.  Attention was then directed to the proximal tibia. Medial and lateral menisci were excised. The extramedullary tibial cutting guide was positioned using computer-assisted navigation so as to achieve a 0 varus-valgus alignment and 0 posterior slope. The cut was  performed and verified using  the computer. The proximal tibia was sized and it was felt that a size 3 tibial tray was appropriate. Tibial and femoral trials were inserted followed by insertion of a 10 mm polyethylene insert. This allowed for excellent mediolateral soft tissue balancing both in flexion and in full extension. Finally, the patella was cut and prepared so as to accommodate a 35 mm 3 peg oval dome patella. A patella trial was placed and the knee was placed through a range of motion with excellent patellar tracking appreciated. The femoral trial was removed after debridement of posterior osteophytes. The central post-hole for the tibial component was reamed followed by insertion of a keel punch. Tibial trials were then removed. Cut surfaces of bone were irrigated with copious amounts of normal saline with antibiotic solution using pulsatile lavage and then suctioned dry. Polymethylmethacrylate cement was prepared in the usual fashion using a vacuum mixer. Cement was applied to the cut surface of the proximal tibia as well as along the undersurface of a size 3 MBT tibial component. Tibial component was positioned and impacted into place. Excess cement was removed using [employer] fast streamer. Cement was then applied to the cut surfaces of the femur as well as along the posterior flanges of the size 4N femoral component. The femoral component was positioned and impacted into place. Excess cement was removed using [employer] fast streamer. A 10 mm polyethylene trial was inserted and the knee was brought into full extension with steady axial compression applied. Finally, cement was applied to the backside of a 35 mm 3 peg oval dome patella and the patellar component was positioned and patellar clamp applied. Excess cement was removed using [employer] fast streamer. After adequate curing of the cement, the tourniquet was deflated after a total tourniquet time of 119 minutes. Hemostasis was achieved using electrocautery. The knee was  irrigated with copious amounts of normal saline with antibiotic solution using pulsatile lavage and then suctioned dry. 20 mL of 1.3% Exparel and 60 mL of 0.25% Marcaine in 40 mL of normal saline was injected along the posterior capsule, medial and lateral gutters, and along the arthrotomy site. A 10 mm stabilized rotating platform polyethylene insert was inserted and the knee was placed through a range of motion with excellent mediolateral soft tissue balancing appreciated and excellent patellar tracking noted. 2 medium drains were placed in the wound bed and brought out through separate stab incisions to be attached to a reinfusion system. The medial parapatellar portion of the incision was reapproximated using interrupted sutures of #1 Vicryl. Subcutaneous tissue was approximated in layers using first #0 Vicryl followed #2-0 Vicryl. The skin was approximated with skin staples. A sterile dressing was applied.  The patient tolerated the procedure well and was transported to the recovery room in stable condition.    Tija Biss P. Maria Trujillo., M.D.

## 2017-02-15 NOTE — Progress Notes (Signed)
Spoke with Dr. Roland Rack. Pt with a blood pressure of 72/31. 29ml bolus

## 2017-02-15 NOTE — NC FL2 (Signed)
[place] LEVEL OF CARE SCREENING TOOL     IDENTIFICATION  Patient Name: Maria Trujillo Birthdate: Mar 27, 1934 Sex: female Admission Date (Current Location): 02/15/2017  [place] and [place] Number:  Engineering geologist and Address:  [facility], [address] [place], [place] [address]      Provider Number: [id number]  Attending Physician Name and Address:  Dereck Leep, MD  Relative Name and Phone Number:       Current Level of Care: Hospital Recommended Level of Care: [place] Prior Approval Number:    Date Approved/Denied:   PASRR Number:  [id number])  Discharge Plan: SNF    Current Diagnoses: Patient Active Problem List   Diagnosis Date Noted  . Esophageal reflux 02/15/2017  . HTN (hypertension) 02/15/2017  . S/P total knee arthroplasty 02/15/2017  . Hyperlipidemia, unspecified 03/06/2014  . Restless leg syndrome 03/06/2014    Orientation RESPIRATION BLADDER Height & Weight     Self, Time, Situation, Place  Normal Continent Weight: 183 lb (83 kg) Height:  5\' 4"  (162.6 cm)  BEHAVIORAL SYMPTOMS/MOOD NEUROLOGICAL BOWEL NUTRITION STATUS   (none)  (none) Continent Diet (Diet: Clear Liquid to be advanced. )  AMBULATORY STATUS COMMUNICATION OF NEEDS Skin   Extensive Assist Verbally Surgical wounds (Incision: Right Knee )                       Personal Care Assistance Level of Assistance  Bathing, Feeding, Dressing Bathing Assistance: Limited assistance Feeding assistance: Independent Dressing Assistance: Limited assistance     Functional Limitations Info  Sight, Hearing, Speech Sight Info: Adequate Hearing Info: Adequate Speech Info: Adequate    SPECIAL CARE FACTORS FREQUENCY  PT (By licensed PT), OT (By licensed OT)     PT Frequency:  (5) OT Frequency:  (5)            Contractures      Additional Factors Info  Code Status, Allergies Code Status Info:  (Full Code. ) Allergies  Info:  (Dorzolamide, Celecoxib, Penicillins, Shellfish Allergy, Sulfa Antibiotics, Amoxicillin, Amoxicillin-pot Clavulanate, Brimonidine, Cefuroxime Axetil, Dipivefrin, Timolol Maleate)           Current Medications (02/15/2017):  This is the current hospital active medication list Current Facility-Administered Medications  Medication Dose Route Frequency Provider Last Rate Last Dose  . 0.9 %  sodium chloride infusion   Intravenous Continuous Dereck Leep, MD      . acetaminophen (OFIRMEV) IV 1,000 mg  1,000 mg Intravenous Q6H Dereck Leep, MD      . acetaminophen (TYLENOL) tablet 650 mg  650 mg Oral Q6H PRN Dereck Leep, MD       Or  . acetaminophen (TYLENOL) suppository 650 mg  650 mg Rectal Q6H PRN Dereck Leep, MD      . alum & mag hydroxide-simeth (MAALOX/MYLANTA) 200-200-20 MG/5ML suspension 30 mL  30 mL Oral Q4H PRN Dereck Leep, MD      . bisacodyl (DULCOLAX) suppository 10 mg  10 mg Rectal Daily PRN Dereck Leep, MD      . calcium-vitamin D (OSCAL WITH D) 500-200 MG-UNIT per tablet 1 tablet  1 tablet Oral Daily Dereck Leep, MD      . cholecalciferol (VITAMIN D) tablet 1,000 Units  1,000 Units Oral Daily Dereck Leep, MD      . clindamycin (CLEOCIN) IVPB 600 mg  600 mg Intravenous Q6H Dereck Leep, MD      .  diphenhydrAMINE (BENADRYL) 12.5 MG/5ML elixir 12.5-25 mg  12.5-25 mg Oral Q4H PRN Dereck Leep, MD      . Derrill Memo ON 02/16/2017] enoxaparin (LOVENOX) injection 30 mg  30 mg Subcutaneous Q12H Dereck Leep, MD      . ferrous sulfate tablet 325 mg  325 mg Oral BID WC Dereck Leep, MD      . ketotifen (ZADITOR) 0.025 % ophthalmic solution 1 drop  1 drop Both Eyes Q12H PRN Dereck Leep, MD      . latanoprost (XALATAN) 0.005 % ophthalmic solution 1 drop  1 drop Both Eyes QHS Dereck Leep, MD      . Derrill Memo ON 02/16/2017] levothyroxine (SYNTHROID, LEVOTHROID) tablet 100 mcg  100 mcg Oral QAC breakfast Dereck Leep, MD      . Derrill Memo ON 02/16/2017] loratadine  (CLARITIN) tablet 10 mg  10 mg Oral Daily Dereck Leep, MD      . magnesium hydroxide (MILK OF MAGNESIA) suspension 30 mL  30 mL Oral Daily PRN Dereck Leep, MD      . menthol-cetylpyridinium (CEPACOL) lozenge 3 mg  1 lozenge Oral PRN Dereck Leep, MD       Or  . phenol (CHLORASEPTIC) mouth spray 1 spray  1 spray Mouth/Throat PRN Dereck Leep, MD      . metoCLOPramide (REGLAN) tablet 10 mg  10 mg Oral TID AC & HS Dereck Leep, MD      . morphine 2 MG/ML injection 2 mg  2 mg Intravenous Q2H PRN Dereck Leep, MD      . multivitamin with minerals tablet 1 tablet  1 tablet Oral Daily Dereck Leep, MD      . multivitamin-lutein (OCUVITE-LUTEIN) capsule 2 capsule  2 capsule Oral BID Dereck Leep, MD      . omega-3 acid ethyl esters (LOVAZA) capsule 1,000 mg  1,000 mg Oral Daily Dereck Leep, MD      . ondansetron (ZOFRAN) 4 MG/2ML injection           . ondansetron (ZOFRAN) tablet 4 mg  4 mg Oral Q6H PRN Dereck Leep, MD       Or  . ondansetron (ZOFRAN) injection 4 mg  4 mg Intravenous Q6H PRN Dereck Leep, MD      . oxyCODONE (Oxy IR/ROXICODONE) immediate release tablet 5-10 mg  5-10 mg Oral Q4H PRN Dereck Leep, MD      . pantoprazole (PROTONIX) EC tablet 40 mg  40 mg Oral BID Dereck Leep, MD      . phenylephrine (SUDAFED PE) tablet 10 mg  10 mg Oral Q4H PRN Dereck Leep, MD      . polyvinyl alcohol (LIQUIFILM TEARS) 1.4 % ophthalmic solution 1 drop  1 drop Both Eyes BID PRN Dereck Leep, MD      . senna-docusate (Senokot-S) tablet 1 tablet  1 tablet Oral BID Dereck Leep, MD      . sodium chloride (OCEAN) 0.65 % nasal spray 1 spray  1 spray Each Nare PRN Dereck Leep, MD      . sodium chloride 0.9 % injection           . sodium phosphate (FLEET) 7-19 GM/118ML enema 1 enema  1 enema Rectal Once PRN Dereck Leep, MD      . traMADol Veatrice Bourbon) tablet 50-100 mg  50-100 mg Oral Q4H PRN Dereck Leep, MD  Discharge Medications: Please see discharge summary  for a list of discharge medications.  Relevant Imaging Results:  Relevant Lab Results:   Additional Information  (SSN: [id number])  Sample, Veronia Beets, LCSW

## 2017-02-15 NOTE — H&P (Signed)
The patient has been re-examined, and the chart reviewed, and there have been no interval changes to the documented history and physical.    The risks, benefits, and alternatives have been discussed at length. The patient expressed understanding of the risks benefits and agreed with plans for surgical intervention.  James P. Hooten, Jr. M.D.    

## 2017-02-15 NOTE — Anesthesia Post-op Follow-up Note (Cosign Needed)
Anesthesia QCDR form completed.        

## 2017-02-16 ENCOUNTER — Encounter
Admission: RE | Admit: 2017-02-16 | Discharge: 2017-02-16 | Disposition: A | Discharge status: Home / Self Care | Payer: PPO | Source: Ambulatory Visit | Attending: Internal Medicine | Admitting: Internal Medicine

## 2017-02-16 ENCOUNTER — Encounter: Payer: Self-pay | Admitting: Orthopedic Surgery

## 2017-02-16 LAB — CBC
HEMATOCRIT: 29 % — AB (ref 35.0–47.0)
HEMOGLOBIN: 10 g/dL — AB (ref 12.0–16.0)
MCH: 31 pg (ref 26.0–34.0)
MCHC: 34.5 g/dL (ref 32.0–36.0)
MCV: 89.8 fL (ref 80.0–100.0)
Platelets: 121 10*3/uL — ABNORMAL LOW (ref 150–440)
RBC: 3.23 MIL/uL — AB (ref 3.80–5.20)
RDW: 13 % (ref 11.5–14.5)
WBC: 7.1 10*3/uL (ref 3.6–11.0)

## 2017-02-16 LAB — BASIC METABOLIC PANEL
ANION GAP: 6 (ref 5–15)
BUN: 14 mg/dL (ref 6–20)
CO2: 25 mmol/L (ref 22–32)
Calcium: 8.2 mg/dL — ABNORMAL LOW (ref 8.9–10.3)
Chloride: 107 mmol/L (ref 101–111)
Creatinine, Ser: 0.6 mg/dL (ref 0.44–1.00)
GFR calc Af Amer: >60 mL/min (ref >60)
GLUCOSE: 124 mg/dL — AB (ref 65–99)
POTASSIUM: 3.4 mmol/L — AB (ref 3.5–5.1)
Sodium: 138 mmol/L (ref 135–145)

## 2017-02-16 MED ORDER — POTASSIUM CHLORIDE CRYS ER 20 MEQ PO TBCR
20.0000 meq | EXTENDED_RELEASE_TABLET | Freq: Three times a day (TID) | ORAL | Status: AC
  Administered 2017-02-16 (×3): 20 meq via ORAL
  Filled 2017-02-16 (×3): qty 1

## 2017-02-16 NOTE — Evaluation (Signed)
Physical Therapy Evaluation Patient Details Name: Maria Trujillo MRN: [id number] DOB: November 11, 1933 Today's Date: 02/16/2017   History of Present Illness  83 y/o female s/p R TKA 4/23, she had R knee replaced 4 years ago.  Clinical Impression  Pt showed great effort t/o the PT exam and though she had some pain and fatigue with activities she continued to work hard with PT.  She was able to do ~15 minutes of exercises apart from the exam including AROM SLRs after some warm up.  She had expected post-op pain, but was only ever >5/10 during PROM knee flexion (achieved 67 deg). Pt pleasant and motivated t/o the session.     Follow Up Recommendations SNF    Equipment Recommendations       Recommendations for Other Services       Precautions / Restrictions Precautions Precautions: Fall Restrictions RLE Weight Bearing: Weight bearing as tolerated      Mobility  Bed Mobility Overal bed mobility: Needs Assistance Bed Mobility: Supine to Sit     Supine to sit: Min guard     General bed mobility comments: Pt needed hand rails to pull up to sitting, but ultimately did well getting to EOB  Transfers Overall transfer level: Needs assistance Equipment used: Rolling walker (2 wheeled) Transfers: Sit to/from Stand Sit to Stand: Min assist         General transfer comment: Pt needed cuing for set-up, positioning and sequencing - she did not need heavy assist to get weight forward, she showed good relative confidence using walker to maintain balance.  Ambulation/Gait Ambulation/Gait assistance: Min guard;Min assist Ambulation Distance (Feet): 30 Feet Assistive device: Rolling walker (2 wheeled)       General Gait Details: Pt only minimally hesitant to use R LE initially but with cuing and encouragement she showed increased confidence, speed and generally did quite well for first time WBing/walking post surgery  Stairs            Wheelchair Mobility    Modified Rankin  (Stroke Patients Only)       Balance Overall balance assessment: Modified Independent                                           Pertinent Vitals/Pain Pain Assessment: 0-10 Pain Score: 3  (5/10 during ambulation) Pain Location: R knee    Home Living Family/patient expects to be discharged to:: Skilled nursing facility Living Arrangements: Alone               Additional Comments: Pt's sister-in-law is going to stay with her for 1 week post STR.    Prior Function Level of Independence: Independent               Hand Dominance        Extremity/Trunk Assessment   Upper Extremity Assessment Upper Extremity Assessment: Defer to OT evaluation    Lower Extremity Assessment Lower Extremity Assessment: RLE deficits/detail RLE Deficits / Details: Pt with expected post-op LE weakness, but was able to do 10 SLRs, and do most exercises with at least some manual resitance       Communication   Communication: No difficulties  Cognition Arousal/Alertness: Awake/alert Behavior During Therapy: WFL for tasks assessed/performed Overall Cognitive Status: Within Functional Limits for tasks assessed  General Comments      Exercises Total Joint Exercises Ankle Circles/Pumps: AROM;10 reps Quad Sets: Strengthening;10 reps Gluteal Sets: Strengthening;10 reps Short Arc Quad: AROM;10 reps Heel Slides: AAROM;10 reps Hip ABduction/ADduction: Strengthening;10 reps Straight Leg Raises: AROM;10 reps Knee Flexion: PROM;5 reps Goniometric ROM: 2-67   Assessment/Plan    PT Assessment Patient needs continued PT services  PT Problem List Decreased strength;Decreased range of motion;Decreased activity tolerance;Decreased mobility;Decreased balance;Decreased safety awareness;Decreased knowledge of use of DME;Pain       PT Treatment Interventions DME instruction;Gait training;Stair training;Functional  mobility training;Therapeutic activities;Therapeutic exercise;Balance training;Neuromuscular re-education;Cognitive remediation;Patient/family education    PT Goals (Current goals can be found in the Care Plan section)  Acute Rehab PT Goals Patient Stated Goal: "Get stronger at rehab so I can go home" PT Goal Formulation: With patient Time For Goal Achievement: 03/02/17 Potential to Achieve Goals: Good    Frequency BID   Barriers to discharge        Co-evaluation               End of Session   Activity Tolerance: Patient tolerated treatment well Patient left: with chair alarm set;with call bell/phone within reach Nurse Communication: Mobility status PT Visit Diagnosis: Muscle weakness (generalized) (M62.81);Difficulty in walking, not elsewhere classified (R26.2)    Time: 1219-7588 PT Time Calculation (min) (ACUTE ONLY): 33 min   Charges:   PT Evaluation $PT Eval Low Complexity: 1 Procedure PT Treatments $Therapeutic Exercise: 8-22 mins   PT G Codes:        Kreg Shropshire, DPT 02/16/2017, 11:25 AM

## 2017-02-16 NOTE — Progress Notes (Signed)
Pt tolerated bedside dangling.

## 2017-02-16 NOTE — Clinical Social Work Note (Signed)
Clinical Social Work Assessment  Patient Details  Name: Maria Trujillo MRN: [id number] Date of Birth: May 01, 1934  Date of referral:  02/16/17               Reason for consult:  Facility Placement, Discharge Planning                Permission sought to share information with:  Chartered certified accountant granted to share information::  Yes, Verbal Permission Granted  Name::      Maria Trujillo::   Maria Trujillo   Relationship::     Contact Information:     Housing/Transportation Living arrangements for the past 2 months:  Maria Trujillo of Information:  Patient Patient Interpreter Needed:  None Criminal Activity/Legal Involvement Pertinent to Current Situation/Hospitalization:  No - Comment as needed Significant Relationships:  Adult Children Lives with:  Self Do you feel safe going back to the place where you live?  Yes Need for family participation in patient care:  Yes (Comment)  Care giving concerns:  Patient lives at home alone in [name].    Social Worker assessment / plan:  Social work Theatre manager received verbal consult that PT is recommending SNF. Social work Theatre manager met with patient alone at bedside. Patient was sitting up in the bedside chair and was alert and oriented x4. Social work Theatre manager introduced self and explained role of social work department. Per patient, she lives at home in [name]. Patient has two children, Maria Trujillo and Maria Trujillo that live in the area. Daughter Maria Trujillo is patient's HPOA. Social work Theatre manager explained that PT is recommending SNF and presented bed offers. Patient chose Maria Trujillo. Maria Trujillo, admissions coordinator at [name] is aware of above. [employer] case manager is also aware of above and is starting authorization. Social work Theatre manager will continue to assist and follow as needed.   FL2 completed and faxed out.    Employment status:  Unemployed Nurse, adult PT  Recommendations:  [place] / Referral to community resources:  [name]  Patient/Family's Response to care:  Patient chose a bed at [name].   Patient/Family's Understanding of and Emotional Response to Diagnosis, Current Treatment, and Prognosis:  Patient was pleasant and thanked social work Theatre manager for visiting.   Emotional Assessment Appearance:  Appears stated age Attitude/Demeanor/Rapport:    Affect (typically observed):  Accepting, Adaptable, Appropriate Orientation:  Oriented to Self, Oriented to Place, Oriented to  Time, Oriented to Situation Alcohol / Substance use:  Not Applicable Psych involvement (Current and /or in the community):  No (Comment)  Discharge Needs  Concerns to be addressed:  Basic Needs Readmission within the last 30 days:  No Current discharge risk:  Dependent with Mobility Barriers to Discharge:  Continued Medical Work up   [employer], [place] Work 02/16/2017, 10:39 AM

## 2017-02-16 NOTE — Clinical Social Work Placement (Signed)
   CLINICAL SOCIAL WORK PLACEMENT  NOTE  Date:  02/16/2017  Patient Details  Name: SAHASRA BELUE MRN: [id number] Date of Birth: 07-27-1934  Clinical Social Work is seeking post-discharge placement for this patient at the [place] level of care (*CSW will initial, date and re-position this form in  chart as items are completed):  Yes   Patient/family provided with [place] Work Department's list of facilities offering this level of care within the geographic area requested by the patient (or if unable, by the patient's family).  Yes   Patient/family informed of their freedom to choose among providers that offer the needed level of care, that participate in Medicare, Medicaid or managed care program needed by the patient, have an available bed and are willing to accept the patient.  Yes   Patient/family informed of [facility]'s ownership interest in [facility] and [facility], as well as of the fact that they are under no obligation to receive care at these facilities.  PASRR submitted to EDS on       PASRR number received on       Existing PASRR number confirmed on 02/15/17     FL2 transmitted to all facilities in geographic area requested by pt/family on 02/15/17     FL2 transmitted to all facilities within larger geographic area on       Patient informed that his/her managed care company has contracts with or will negotiate with certain facilities, including the following:        Yes   Patient/family informed of bed offers received.  Patient chooses bed at  [facility])     Physician recommends and patient chooses bed at      Patient to be transferred to   on  .  Patient to be transferred to facility by       Patient family notified on   of transfer.  Name of family member notified:        PHYSICIAN       Additional Comment:    _______________________________________________ Danie Chandler, [place]  Work 02/16/2017, 10:45 AM

## 2017-02-16 NOTE — Progress Notes (Signed)
Physical Therapy Treatment Patient Details Name: Maria Trujillo MRN: [id number] DOB: Mar 22, 1934 Today's Date: 02/16/2017    History of Present Illness Pt. is an 83 y.o. female who was admitted to [facility] for an elective Right TKR. Pt. PMHX includes: Esophageal Reflux, HTN, Hyperlipidemia, Restless Leg Syndrome, Hypothyroidism, Kidney Stones, Athritis, Previous Left TKR.    PT Comments    Pt did well with this afternoon's PT session.  She showed great effort, did not have significant pain apart from PROM knee flexion.  She continued to show increased mobility and general strength/function with all tasks with good ambulation showing confidence and consistent speed/cadence.  Pt continued to be pleasant and motivated with PT session.   Follow Up Recommendations  SNF     Equipment Recommendations       Recommendations for Other Services       Precautions / Restrictions Precautions Precautions: Fall Restrictions Weight Bearing Restrictions: Yes RLE Weight Bearing: Weight bearing as tolerated    Mobility  Bed Mobility Overal bed mobility: Modified Independent Bed Mobility: Sit to Supine       Sit to supine: Min guard   General bed mobility comments: Pt was able to get b/l LEs back into bed with a lot of effort, but no direct assist  Transfers Overall transfer level: Modified independent Equipment used: Rolling walker (2 wheeled) Transfers: Sit to/from Stand Sit to Stand: Min guard         General transfer comment: Pt needed cuing for set up, sequencing and motivation, but was able to get up/down from sitting w/o direct assist  Ambulation/Gait Ambulation/Gait assistance: Min guard Ambulation Distance (Feet): 60 Feet Assistive device: Rolling walker (2 wheeled)       General Gait Details: Pt was able to maintain consistent forward momentum with walker and showed good confidence and no buckling with the effort.  She had some fatigue afterward, but ultimately did well and  showed good safety.    Stairs            Wheelchair Mobility    Modified Rankin (Stroke Patients Only)       Balance Overall balance assessment: Modified Independent                                          Cognition Arousal/Alertness: Awake/alert Behavior During Therapy: WFL for tasks assessed/performed Overall Cognitive Status: Within Functional Limits for tasks assessed                                        Exercises Total Joint Exercises Ankle Circles/Pumps: AROM;10 reps Quad Sets: Strengthening;10 reps Gluteal Sets: Strengthening;10 reps Short Arc Quad: AROM;10 reps Heel Slides: 5 reps;AAROM Hip ABduction/ADduction: Strengthening;10 reps Straight Leg Raises: AROM;10 reps Knee Flexion: PROM;5 reps    General Comments        Pertinent Vitals/Pain Pain Assessment: 0-10 Pain Score: 0-No pain (pain 2-3/10 with activity) Pain Location: Right Knee Pain Intervention(s): Limited activity within patient's tolerance;Monitored during session    Home Living Family/patient expects to be discharged to:: Skilled nursing facility Living Arrangements: Alone                  Prior Function Level of Independence: Independent          PT Goals (current goals can now  be found in the care plan section) Acute Rehab PT Goals Patient Stated Goal: To get stronger Progress towards PT goals: Progressing toward goals    Frequency    BID      PT Plan Current plan remains appropriate    Co-evaluation             End of Session Equipment Utilized During Treatment: Gait belt Activity Tolerance: Patient tolerated treatment well Patient left: with bed alarm set;with call bell/phone within reach Nurse Communication: Mobility status PT Visit Diagnosis: Muscle weakness (generalized) (M62.81);Difficulty in walking, not elsewhere classified (R26.2)     Time: 9532-0233 PT Time Calculation (min) (ACUTE ONLY): 28  min  Charges:  $Gait Training: 8-22 mins $Therapeutic Exercise: 8-22 mins                    G Codes:       Kreg Shropshire, DPT 02/16/2017, 5:04 PM

## 2017-02-16 NOTE — Progress Notes (Addendum)
Health Team authorization has been received, auth # [id number]. Plan is for patient to D/C to [facility] when medically stable. [facility] admissions coordinator at [facility] is aware of above. Patient is aware of above. Patient is agreeable to a semi-private room at [facility]. Clinical Social Worker (CSW) will continue to follow and assist as needed.   [employer], LCSW [phone]

## 2017-02-16 NOTE — Anesthesia Postprocedure Evaluation (Signed)
Anesthesia Post Note  Patient: Maria Trujillo  Procedure(s) Performed: Procedure(s) (LRB): COMPUTER ASSISTED TOTAL KNEE ARTHROPLASTY (Right)  Patient location during evaluation: Nursing Unit Anesthesia Type: Spinal Level of consciousness: awake, awake and alert and oriented Pain management: pain level controlled Vital Signs Assessment: post-procedure vital signs reviewed and stable Respiratory status: spontaneous breathing, nonlabored ventilation and respiratory function stable Cardiovascular status: stable Anesthetic complications: no     Last Vitals:  Vitals:   02/16/17 0213 02/16/17 0417  BP: (!) 101/50 (!) 102/52  Pulse: 60 61  Resp: 18 18  Temp:  37 C    Last Pain:  Vitals:   02/16/17 0545  TempSrc:   PainSc: Asleep                 Lance Muss

## 2017-02-16 NOTE — Progress Notes (Signed)
   Subjective: 1 Day Post-Op Procedure(s) (LRB): COMPUTER ASSISTED TOTAL KNEE ARTHROPLASTY (Right) Patient reports pain as 2 on 0-10 scale.   Patient is well, and has had no acute complaints or problems We will start therapy today.  Plan is to go Rehab after hospital stay. no nausea and no vomiting Patient denies any chest pains or shortness of breath. Objective: Vital signs in last 24 hours: Temp:  [97.8 F (36.6 C)-98.6 F (37 C)] 98.6 F (37 C) (04/24 0417) Pulse Rate:  [55-70] 61 (04/24 0417) Resp:  [11-20] 18 (04/24 0417) BP: (72-165)/(39-68) 102/52 (04/24 0417) SpO2:  [96 %-100 %] 97 % (04/24 0417) Weight:  [83 kg (183 lb)] 83 kg (183 lb) (04/23 0926) Heels are non tender and elevated off the bed using rolled towels along with bone foam under the operative leg. Intake/Output from previous day: 04/23 0701 - 04/24 0700 In: 2765 [P.O.:480; I.V.:1725; IV Piggyback:560] Out: 2410 [Urine:2050; Drains:260; Blood:100] Intake/Output this shift: No intake/output data recorded.   Recent Labs  02/16/17 0454  HGB 10.0*    Recent Labs  02/16/17 0454  WBC 7.1  RBC 3.23*  HCT 29.0*  PLT 121*    Recent Labs  02/16/17 0454  NA 138  K 3.4*  CL 107  CO2 25  BUN 14  CREATININE 0.60  GLUCOSE 124*  CALCIUM 8.2*   No results for input(s): LABPT, INR in the last 72 hours.  EXAM General - Patient is Alert, Appropriate and Oriented Extremity - Neurologically intact Neurovascular intact Sensation intact distally Intact pulses distally Dorsiflexion/Plantar flexion intact No cellulitis present Dressing - dressing C/D/I Motor Function - intact, moving foot and toes well on exam.    Past Medical History:  Diagnosis Date  . Arthritis   . History of kidney stones   . Hypothyroidism     Assessment/Plan: 1 Day Post-Op Procedure(s) (LRB): COMPUTER ASSISTED TOTAL KNEE ARTHROPLASTY (Right) Active Problems:   S/P total knee arthroplasty  Estimated body mass index is  31.41 kg/m as calculated from the following:   Height as of this encounter: 5\' 4"  (1.626 m).   Weight as of this encounter: 83 kg (183 lb). Advance diet Up with therapy D/C IV fluids Plan for discharge tomorrow Discharge to SNF  Labs: Were reviewed. Potassium 3.4 will supplement with Klor-Con  DVT Prophylaxis - Lovenox, Foot Pumps and TED hose Weight-Bearing as tolerated to right leg D/C O2 and Pulse OX and try on Room [employer] tomorrow morning Begin working on bowel movement  Antaeus Karel R. [place] 02/16/2017, 7:25 AM

## 2017-02-16 NOTE — Discharge Summary (Signed)
Physician Discharge Summary  Patient ID: Maria Trujillo MRN: [id number] DOB/AGE: 1933-12-19 83 y.o.  Admit date: 02/15/2017 Discharge date: 02/17/2017  Admission Diagnoses:  PRIMARY OSTEOARTHRITIS OF RIGHT KNEE   Discharge Diagnoses: Patient Active Problem List   Diagnosis Date Noted  . Esophageal reflux 02/15/2017  . HTN (hypertension) 02/15/2017  . S/P total knee arthroplasty 02/15/2017  . Hyperlipidemia, unspecified 03/06/2014  . Restless leg syndrome 03/06/2014    Past Medical History:  Diagnosis Date  . Arthritis   . History of kidney stones   . Hypothyroidism      Transfusion: No transfusions during this admission   Consultants (if any):   Discharged Condition: Improved  Hospital Course: Maria Trujillo is an 83 y.o. female who was admitted 02/15/2017 with a diagnosis of degenerative arthrosis right knee and went to the operating room on 02/15/2017 and underwent the above named procedures.    Surgeries:Procedure(s): COMPUTER ASSISTED TOTAL KNEE ARTHROPLASTY on 02/15/2017  PRE-OPERATIVE DIAGNOSIS: Degenerative arthrosis of the right knee, primary  POST-OPERATIVE DIAGNOSIS:  Same  PROCEDURE:  Right total knee arthroplasty using computer-assisted navigation  SURGEON:  Marciano Sequin. M.D.  ASSISTANT:  Vance Peper, PA (present and scrubbed throughout the case, critical for assistance with exposure, retraction, instrumentation, and closure)  ANESTHESIA: spinal  ESTIMATED BLOOD LOSS: 100 mL  FLUIDS REPLACED: 1500 mL of crystalloid  TOURNIQUET TIME: 119 minutes  DRAINS: 2 medium drains to a reinfusion system  SOFT TISSUE RELEASES: Anterior cruciate ligament, posterior cruciate ligament, deep medial collateral ligament, patellofemoral ligament  IMPLANTS UTILIZED: DePuy PFC Sigma size 4N posterior stabilized femoral component (cemented), size 3 MBT tibial component (cemented), 35 mm 3 peg oval dome patella (cemented), and a 10 mm stabilized rotating  platform polyethylene insert.  INDICATIONS FOR SURGERY: Maria Trujillo is a 83 y.o. year old female with a long history of progressive knee pain. X-rays demonstrated severe degenerative changes in tricompartmental fashion. The patient had not seen any significant improvement despite conservative nonsurgical intervention. After discussion of the risks and benefits of surgical intervention, the patient expressed understanding of the risks benefits and agree with plans for total knee arthroplasty.   The risks, benefits, and alternatives were discussed at length including but not limited to the risks of infection, bleeding, nerve injury, stiffness, blood clots, the need for revision surgery, cardiopulmonary complications, among others, and they were willing to proceed.  Patient tolerated the surgery well. No complications .Patient was taken to PACU where she was stabilized and then transferred to the orthopedic floor.  Patient started on Lovenox 30 mg q 12 hrs. Foot pumps applied bilaterally at 80 mm hgb. Heels elevated off bed with rolled towels. No evidence of DVT. Calves non tender. Negative Homan. Physical therapy started on day #1 for gait training and transfer with OT starting on  day #1 for ADL and assisted devices. Patient has done well with therapy. Ambulated 60 feet upon being discharged.  Patient's IV and Foley were discontinued on day #1 with Hemovac being discontinued on day #2. Dressing change on day 2 prior to patient being discharged   She was given perioperative antibiotics:  Anti-infectives    Start     Dose/Rate Route Frequency Ordered Stop   02/15/17 1800  clindamycin (CLEOCIN) IVPB 600 mg     600 mg 100 mL/hr over 30 Minutes Intravenous Every 6 hours 02/15/17 1705 02/16/17 1759   02/15/17 0753  clindamycin (CLEOCIN) 900 MG/50ML IVPB    Comments:  Ronnell Freshwater: cabinet override  02/15/17 0753 02/15/17 1155   02/15/17 0600  clindamycin (CLEOCIN) IVPB 900 mg  Status:   Discontinued     900 mg 100 mL/hr over 30 Minutes Intravenous On call to O.R. 02/14/17 2159 02/15/17 9563    .  She was fitted with AV 1 compression foot pump devices, instructed on heel pumps, early ambulation, and TED stockings bilaterally for DVT prophylaxis.  She benefited maximally from the hospital stay and there were no complications.    Recent vital signs:  Vitals:   02/16/17 0213 02/16/17 0417  BP: (!) 101/50 (!) 102/52  Pulse: 60 61  Resp: 18 18  Temp:  98.6 F (37 C)    Recent laboratory studies:  Lab Results  Component Value Date   HGB 10.0 (L) 02/16/2017   HGB 12.7 02/02/2017   HGB 9.0 (L) 12/30/2012   Lab Results  Component Value Date   WBC 7.1 02/16/2017   PLT 121 (L) 02/16/2017   Lab Results  Component Value Date   INR 1.06 02/02/2017   Lab Results  Component Value Date   NA 138 02/16/2017   K 3.4 (L) 02/16/2017   CL 107 02/16/2017   CO2 25 02/16/2017   BUN 14 02/16/2017   CREATININE 0.60 02/16/2017   GLUCOSE 124 (H) 02/16/2017    Discharge Medications:   Allergies as of 02/17/2017      Reactions   Dorzolamide Shortness Of Breath   Celecoxib Hives   Penicillins Other (See Comments)   Redness (sunburn type rash with peeling) Has patient had a PCN reaction causing immediate rash, facial/tongue/throat swelling, SOB or lightheadedness with hypotension: No Has patient had a PCN reaction causing severe rash involving mucus membranes or skin necrosis: No Has patient had a PCN reaction that required hospitalization No Has patient had a PCN reaction occurring within the last 10 years: Yes If all of the above answers are "NO", then may proceed with Cephalosporin use.   Shellfish Allergy Hives, Itching   Sulfa Antibiotics Other (See Comments)   Redness in eyes   Amoxicillin Rash   Amoxicillin-pot Clavulanate Other (See Comments)   Redness and flushing   Brimonidine Other (See Comments)   blisters   Cefuroxime Axetil Other (See Comments)    redness   Dipivefrin Other (See Comments)   sleepiness   Timolol Maleate Other (See Comments)   Redness      Medication List    STOP taking these medications   aspirin EC 81 MG tablet   naproxen sodium 220 MG tablet Commonly known as:  ANAPROX     TAKE these medications   acetaminophen 650 MG CR tablet Commonly known as:  TYLENOL Take 650 mg by mouth daily as needed for pain.   acetaminophen 500 MG tablet Commonly known as:  TYLENOL Take 500 mg by mouth every 8 (eight) hours as needed for mild pain.   bimatoprost 0.03 % ophthalmic solution Commonly known as:  LUMIGAN Place 1 drop into both eyes at bedtime.   CALCIUM 600-D 600-400 MG-UNIT Tabs Generic drug:  Calcium Carbonate-Vitamin D3 Take 1 tablet by mouth daily.   cholecalciferol 1000 units tablet Commonly known as:  VITAMIN D Take 1,000 Units by mouth daily.   docusate sodium 100 MG capsule Commonly known as:  COLACE Take 100 mg by mouth daily as needed for mild constipation.   enoxaparin 30 MG/0.3ML injection Commonly known as:  LOVENOX Inject 0.3 mLs (30 mg total) into the skin every 12 (twelve) hours.   FISH  OIL OMEGA-3 1000 MG Caps Take 1,000 mg by mouth daily.   KP KETOTIFEN FUMARATE OP Apply 1 drop to eye every 12 (twelve) hours as needed.   levothyroxine 100 MCG tablet Commonly known as:  SYNTHROID, LEVOTHROID Take 100 mcg by mouth daily before breakfast.   loratadine 10 MG tablet Commonly known as:  CLARITIN Take 10 mg by mouth daily.   multivitamin with minerals Tabs tablet Take 1 tablet by mouth daily.   oxyCODONE 5 MG immediate release tablet Commonly known as:  Oxy IR/ROXICODONE Take 1-2 tablets (5-10 mg total) by mouth every 4 (four) hours as needed for severe pain or breakthrough pain.   phenylephrine 10 MG Tabs tablet Commonly known as:  SUDAFED PE Take 10 mg by mouth every 4 (four) hours as needed.   PRESERVISION AREDS 2 PO Take 2 tablets by mouth 2 (two) times daily.    sodium chloride 0.65 % Soln nasal spray Commonly known as:  OCEAN Place 1 spray into both nostrils as needed for congestion.   SOOTHE 0.6-0.6 % Soln Generic drug:  Propylene Glycol-Glycerin Apply 1 drop to eye 2 (two) times daily as needed (irritation).   traMADol 50 MG tablet Commonly known as:  ULTRAM Take 1-2 tablets (50-100 mg total) by mouth every 4 (four) hours as needed for moderate pain.            Durable Medical Equipment        Start     Ordered   02/15/17 1706  DME Walker rolling  Once    Question:  Patient needs a walker to treat with the following condition  Answer:  Total knee replacement status   02/15/17 1705   02/15/17 1706  DME Bedside commode  Once    Question:  Patient needs a bedside commode to treat with the following condition  Answer:  Total knee replacement status   02/15/17 1705      Diagnostic Studies: Dg Knee Right Port  Result Date: 02/15/2017 CLINICAL DATA:  Post RIGHT total knee replacement EXAM: PORTABLE RIGHT KNEE - 1-2 VIEW COMPARISON:  Portable exam 1533 hours without priors for comparison FINDINGS: Components of RIGHT knee prosthesis identified without fracture dislocation. Overlying skin clips and anterior surgical drains noted. No periprosthetic lucency. Expected postsurgical soft tissue changes. IMPRESSION: RIGHT knee prosthesis without acute complication. Electronically Signed   By: Lavonia Dana M.D.   On: 02/15/2017 16:17    Disposition:     Follow-up Information    Tamiyah Moulin R., PA On 03/02/2017.   Specialty:  Physician Assistant Why:  at 1:15pm Contact information: [place] [address] [phone]        Dereck Leep, MD On 03/30/2017.   Specialty:  Orthopedic Surgery Why:  at 1:30pm Contact information: [place] [address] [phone]            Signed: Watt Climes. 02/16/2017, 7:30 AM

## 2017-02-16 NOTE — Discharge Instructions (Signed)

## 2017-02-16 NOTE — Progress Notes (Signed)
Pt remaining alert and oriented. Minimal pain during the night. Pt would like to stick with Tramadol for pain control. Pt with a blood pressure of 72/39 at the beginning of the shift. Dr. Roland Rack notified of blood pressure and bolus was given. Blood pressure better after iv bolus. The pt was able to dangle at the bedside for several minutes and tolerated well. Surgical dressing dry and intact. Foley patent and draining urine. Iv infusing without difficulty. Pt able to sleep in between care.

## 2017-02-16 NOTE — Evaluation (Addendum)
Occupational Therapy Evaluation Patient Details Name: Maria Trujillo MRN: [id number] DOB: 05-12-1934 Today's Date: 02/16/2017    History of Present Illness Pt. is an 83 y.o. female who was admitted to [facility] for an elective Right TKR. Pt. PMHX includes: Esophageal Reflux, HTN, Hyperlipidemia, Restless Leg Syndrome, Hypothyroidism, Kidney Stones, Athritis, Previous Left TKR.   Clinical Impression   Pt. is a n 83 y.o. female who was admitted to [facility] for an elective right TKR. Pt. presents with weakness, 5/10 pain, and impaired functional mobility which hinder her ability to complete ADL, and IADL functioning. Pt. could benefit from skilled OT services for ADL training, A/E training, work simplification techniques, and pt. education about home modification, and DME in order to regain functioning and return to independent living. Pt. could benefit from SNF level of care upon discharge with follow-up OT services.    Follow Up Recommendations  SNF    Equipment Recommendations  3 in 1 bedside commode;Tub/shower seat    Recommendations for Other Services       Precautions / Restrictions Precautions Precautions: Fall Restrictions Weight Bearing Restrictions: Yes RLE Weight Bearing: Weight bearing as tolerated                                                    ADL either performed or assessed with clinical judgement   ADL Overall ADL's : Needs assistance/impaired Eating/Feeding: Set up   Grooming: Set up       Lower Body Bathing: Moderate assistance   Upper Body Dressing : Set up   Lower Body Dressing: Moderate assistance               Functional mobility during ADLs: Minimal assistance General ADL Comments: Pt. education was provided about A/E use for LE ADLs.     Vision         Perception     Praxis      Pertinent Vitals/Pain Pain Assessment: 0-10 Pain Score: 5  Pain Location: Right Knee Pain Intervention(s): Limited activity within  patient's tolerance;Monitored during session, nursing in to provide pt. with pain medicine.     Hand Dominance     Extremity/Trunk Assessment Upper Extremity Assessment Upper Extremity Assessment: Overall WFL for tasks assessed           Communication Communication Communication: No difficulties   Cognition Arousal/Alertness: Awake/alert Behavior During Therapy: WFL for tasks assessed/performed Overall Cognitive Status: Within Functional Limits for tasks assessed                                     General Comments       Exercises     Shoulder Instructions      Home Living Family/patient expects to be discharged to:: Skilled nursing facility Living Arrangements: Alone                                      Prior Functioning/Environment Level of Independence: Independent , Independent with IADLs.                OT Problem List: Decreased strength      OT Treatment/Interventions: Therapeutic exercise;Self-care/ADL training;Therapeutic activities;DME and/or AE instruction;Patient/family education  OT Goals(Current goals can be found in the care plan section) Acute Rehab OT Goals Patient Stated Goal: To get stronger OT Goal Formulation: With patient Potential to Achieve Goals: Good  OT Frequency: Min 1X/week   Barriers to D/C: Decreased caregiver support          Co-evaluation              End of Session Equipment Utilized During Treatment: Gait belt  Activity Tolerance: Patient tolerated treatment well Patient left: in bed;with call bell/phone within reach;with bed alarm set  OT Visit Diagnosis: Muscle weakness (generalized) (M62.81)                Time: 7092-9574 OT Time Calculation (min): 23 min Charges:  OT General Charges $OT Visit: 1 Procedure OT Evaluation $OT Eval Moderate Complexity: 1 Procedure G-Codes:    Harrel Carina, MS, OTR/L Harrel Carina, MS, OTR/L 02/16/2017, 4:37 PM

## 2017-02-17 DIAGNOSIS — H409 Unspecified glaucoma: Secondary | ICD-10-CM | POA: Diagnosis not present

## 2017-02-17 DIAGNOSIS — M25561 Pain in right knee: Secondary | ICD-10-CM | POA: Diagnosis not present

## 2017-02-17 DIAGNOSIS — Z96651 Presence of right artificial knee joint: Secondary | ICD-10-CM | POA: Diagnosis not present

## 2017-02-17 DIAGNOSIS — M6281 Muscle weakness (generalized): Secondary | ICD-10-CM | POA: Diagnosis not present

## 2017-02-17 DIAGNOSIS — R262 Difficulty in walking, not elsewhere classified: Secondary | ICD-10-CM | POA: Diagnosis not present

## 2017-02-17 DIAGNOSIS — Z471 Aftercare following joint replacement surgery: Secondary | ICD-10-CM | POA: Diagnosis not present

## 2017-02-17 DIAGNOSIS — Z7401 Bed confinement status: Secondary | ICD-10-CM | POA: Diagnosis not present

## 2017-02-17 DIAGNOSIS — E039 Hypothyroidism, unspecified: Secondary | ICD-10-CM | POA: Diagnosis not present

## 2017-02-17 DIAGNOSIS — J309 Allergic rhinitis, unspecified: Secondary | ICD-10-CM | POA: Diagnosis not present

## 2017-02-17 DIAGNOSIS — E785 Hyperlipidemia, unspecified: Secondary | ICD-10-CM | POA: Diagnosis not present

## 2017-02-17 LAB — CBC
HEMATOCRIT: 29.9 % — AB (ref 35.0–47.0)
Hemoglobin: 9.9 g/dL — ABNORMAL LOW (ref 12.0–16.0)
MCH: 29.8 pg (ref 26.0–34.0)
MCHC: 33.3 g/dL (ref 32.0–36.0)
MCV: 89.6 fL (ref 80.0–100.0)
PLATELETS: 123 10*3/uL — AB (ref 150–440)
RBC: 3.33 MIL/uL — ABNORMAL LOW (ref 3.80–5.20)
RDW: 13.2 % (ref 11.5–14.5)
WBC: 7.9 10*3/uL (ref 3.6–11.0)

## 2017-02-17 LAB — BASIC METABOLIC PANEL
ANION GAP: 2 — AB (ref 5–15)
BUN: 10 mg/dL (ref 6–20)
CO2: 27 mmol/L (ref 22–32)
CREATININE: 0.64 mg/dL (ref 0.44–1.00)
Calcium: 8.5 mg/dL — ABNORMAL LOW (ref 8.9–10.3)
Chloride: 106 mmol/L (ref 101–111)
GFR calc Af Amer: >60 mL/min (ref >60)
GFR calc non Af Amer: >60 mL/min (ref >60)
GLUCOSE: 108 mg/dL — AB (ref 65–99)
Potassium: 4.1 mmol/L (ref 3.5–5.1)
Sodium: 135 mmol/L (ref 135–145)

## 2017-02-17 MED ORDER — OXYCODONE HCL 5 MG PO TABS: 5.0000 mg | ORAL_TABLET | ORAL | 0 refills | Status: DC | PRN

## 2017-02-17 MED ORDER — TRAMADOL HCL 50 MG PO TABS: 50.0000 mg | ORAL_TABLET | ORAL | 0 refills | Status: DC | PRN

## 2017-02-17 MED ORDER — LACTULOSE 10 GM/15ML PO SOLN
10.0000 g | Freq: Two times a day (BID) | ORAL | Status: DC | PRN
  Administered 2017-02-17: 10 g via ORAL
  Filled 2017-02-17: qty 30

## 2017-02-17 MED ORDER — ENOXAPARIN SODIUM 30 MG/0.3ML ~~LOC~~ SOLN: 30.0000 mg | Freq: Two times a day (BID) | SUBCUTANEOUS | 0 refills | Status: DC

## 2017-02-17 NOTE — Progress Notes (Signed)
   Subjective: 2 Days Post-Op Procedure(s) (LRB): COMPUTER ASSISTED TOTAL KNEE ARTHROPLASTY (Right) Patient reports pain as 5 on 0-10 scale.   Patient is well, and has had no acute complaints or problems Plan is to go Rehab after hospital stay. no nausea and no vomiting Patient denies any chest pains or shortness of breath. Objective: Vital signs in last 24 hours: Temp:  [98 F (36.7 C)-98.8 F (37.1 C)] 98 F (36.7 C) (04/24 2305) Pulse Rate:  [56-63] 63 (04/24 2305) Resp:  [16-18] 18 (04/24 2305) BP: (112-137)/(44-54) 137/54 (04/24 2305) SpO2:  [94 %-98 %] 94 % (04/24 2305) well approximated incision Heels are non tender and elevated off the bed using rolled towels Intake/Output from previous day: 04/24 0701 - 04/25 0700 In: 1170 [P.O.:480; I.V.:560] Out: 50 [Drains:50] Intake/Output this shift: Total I/O In: -  Out: 50 [Drains:50]   Recent Labs  02/16/17 0454  HGB 10.0*    Recent Labs  02/16/17 0454  WBC 7.1  RBC 3.23*  HCT 29.0*  PLT 121*    Recent Labs  02/16/17 0454  NA 138  K 3.4*  CL 107  CO2 25  BUN 14  CREATININE 0.60  GLUCOSE 124*  CALCIUM 8.2*   No results for input(s): LABPT, INR in the last 72 hours.  EXAM General - Patient is Alert, Appropriate and Oriented Extremity - Neurologically intact Neurovascular intact Sensation intact distally Intact pulses distally Dorsiflexion/Plantar flexion intact No cellulitis present Compartment soft Dressing - dressing C/D/I Motor Function - intact, moving foot and toes well on exam.    Past Medical History:  Diagnosis Date  . Arthritis   . History of kidney stones   . Hypothyroidism     Assessment/Plan: 2 Days Post-Op Procedure(s) (LRB): COMPUTER ASSISTED TOTAL KNEE ARTHROPLASTY (Right) Active Problems:   S/P total knee arthroplasty  Estimated body mass index is 31.41 kg/m as calculated from the following:   Height as of this encounter: 5\' 4"  (1.626 m).   Weight as of this  encounter: 83 kg (183 lb). Up with therapy Discharge to SNF  Labs: Pending DVT Prophylaxis - Lovenox, Foot Pumps and TED hose Weight-Bearing as tolerated to right leg Hemovac discontinued on today's visit. Patient needs a bowel movement. Please change dressing prior to patient being discharged Patient may be discharged to rehabilitation once she has a bowel movement Please wash operative leg and apply TED stockings prior to discharge  Jaanai Salemi R. [place] 02/17/2017, 6:49 AM

## 2017-02-17 NOTE — Clinical Social Work Placement (Signed)
   CLINICAL SOCIAL WORK PLACEMENT  NOTE  Date:  02/17/2017  Patient Details  Name: Maria Trujillo MRN: [id number] Date of Birth: Apr 06, 1934  Clinical Social Work is seeking post-discharge placement for this patient at the [place] level of care (*CSW will initial, date and re-position this form in  chart as items are completed):  Yes   Patient/family provided with [place] Work Department's list of facilities offering this level of care within the geographic area requested by the patient (or if unable, by the patient's family).  Yes   Patient/family informed of their freedom to choose among providers that offer the needed level of care, that participate in Medicare, Medicaid or managed care program needed by the patient, have an available bed and are willing to accept the patient.  Yes   Patient/family informed of 's ownership interest in [facility] and [facility], as well as of the fact that they are under no obligation to receive care at these facilities.  PASRR submitted to EDS on       PASRR number received on       Existing PASRR number confirmed on 02/15/17     FL2 transmitted to all facilities in geographic area requested by pt/family on 02/15/17     FL2 transmitted to all facilities within larger geographic area on       Patient informed that his/her managed care company has contracts with or will negotiate with certain facilities, including the following:        Yes   Patient/family informed of bed offers received.  Patient chooses bed at  [facility])     Physician recommends and patient chooses bed at      Patient to be transferred to  [facility]) on 02/17/17.  Patient to be transferred to facility by  [facility] )     Patient family notified on 02/17/17 of transfer.  Name of family member notified:   (Patient's daughter Leana Roe is at bedside and aware of D/C today. )     PHYSICIAN        Additional Comment:    _______________________________________________ Beatriz Quintela, Veronia Beets, LCSW 02/17/2017, 10:43 AM

## 2017-02-17 NOTE — Plan of Care (Signed)
Problem: Skin Integrity: Goal: Risk for impaired skin integrity will decrease Outcome: Progressing Surgical dressing remaining dry and intact.  Problem: Activity: Goal: Risk for activity intolerance will decrease Outcome: Progressing Ambulating to bathroom with 1 person assist.  Problem: Nutrition: Goal: Adequate nutrition will be maintained Outcome: Progressing Eating and drinking without difficulty

## 2017-02-17 NOTE — Progress Notes (Signed)
Patient is medically stable for D/C to [facility] today. Per [facility] admissions coordinator at [facility] patient can come today to room 203-B. Health Team authorization has been received. RN will call report at [phone] and arrange EMS for transport after 12. Clinical Education officer, museum (CSW) sent D/C orders to [employer] via [employer]. Patient is aware of above. Patient's daughter Leana Roe is at bedside and aware of above. Please reconsult if future social work needs arise. CSW signing off.   [employer], LCSW [phone]

## 2017-02-17 NOTE — Progress Notes (Signed)
Patient is A&Ox4 with forgetfulness at times. Verbalizes needs. 1 assist to [facility]. Intaking fluids ok. Discharging to rehab this day.

## 2017-02-17 NOTE — Progress Notes (Signed)
Called report to [place], LPN at [facility]. Answered all questions. Belongings packed. EMS will be called at 12.00pm

## 2017-02-17 NOTE — Progress Notes (Signed)
Called EMS to set up transport for after 12.

## 2017-02-17 NOTE — Progress Notes (Signed)
Physical Therapy Treatment Patient Details Name: Maria Trujillo MRN: [id number] DOB: [birth year] Today's Date: 02/17/2017    History of Present Illness Pt. is an 83 y.o. female who was admitted to [facility] for an elective Right TKR. Pt. PMHX includes: Esophageal Reflux, HTN, Hyperlipidemia, Restless Leg Syndrome, Hypothyroidism, Kidney Stones, Athritis, Previous Left TKR.    PT Comments    Progressive increase in gait distance with improving tolerance for R LE WBing; min cuing for postural extension and increased cadence. Good effort with all activities; very motivated to progress/participate and regain independent as able.    Follow Up Recommendations  SNF     Equipment Recommendations       Recommendations for Other Services       Precautions / Restrictions Precautions Precautions: Fall Restrictions Weight Bearing Restrictions: Yes RLE Weight Bearing: Weight bearing as tolerated    Mobility  Bed Mobility Overal bed mobility: Needs Assistance Bed Mobility: Supine to Sit     Supine to sit: Min assist     General bed mobility comments: assist for R LE management over edge of bed  Transfers Overall transfer level: Needs assistance Equipment used: Rolling walker (2 wheeled) Transfers: Sit to/from Stand Sit to Stand: Min guard;Min assist         General transfer comment: maintains R LE anterior to BOS with movement transition; limited active use of surgical extremity, heavy use of UEs to assist with lift off  Ambulation/Gait Ambulation/Gait assistance: Min guard Ambulation Distance (Feet): 140 Feet Assistive device: Rolling walker (2 wheeled)       General Gait Details: step to progressing to step through gait pattern with increasing R LE stance time throughout distance.   Min cuing for postural extension and increased cadence.   Stairs            Wheelchair Mobility    Modified Rankin (Stroke Patients Only)       Balance Overall balance assessment:  Needs assistance Sitting-balance support: No upper extremity supported;Feet supported Sitting balance-Leahy Scale: Good     Standing balance support: Bilateral upper extremity supported Standing balance-Leahy Scale: Fair                              Cognition Arousal/Alertness: Awake/alert Behavior During Therapy: WFL for tasks assessed/performed Overall Cognitive Status: Within Functional Limits for tasks assessed                                        Exercises Total Joint Exercises Goniometric ROM: 3-81 degrees, act assist in short-sitting Other Exercises Other Exercises: Seated LE therex, 1x10, AROM for muscular strength/endurance with functional activities:  ankle pumps, LAQs, marching, repeated flex/ext on towel. Other Exercises: Sit/stand with RW, cga/min assist-heavy use of bilat UEs, limited use of R LE with movement transition Other Exercises: Toilet transfer, ambulatory with RW, cga/close sup; sit/stand from [facility] with RW, cga; standing balance for hand hygiene, close sup.    General Comments        Pertinent Vitals/Pain Pain Assessment: 0-10 Pain Score: 5  Pain Location: Right Knee Pain Descriptors / Indicators: Aching;Grimacing Pain Intervention(s): Limited activity within patient's tolerance;Monitored during session;Repositioned;RN gave pain meds during session    Home Living                      Prior Function  PT Goals (current goals can now be found in the care plan section) Acute Rehab PT Goals Patient Stated Goal: To get stronger PT Goal Formulation: With patient Time For Goal Achievement: 03/02/17 Potential to Achieve Goals: Good Progress towards PT goals: Progressing toward goals    Frequency    BID      PT Plan Current plan remains appropriate    Co-evaluation             End of Session Equipment Utilized During Treatment: Gait belt Activity Tolerance: Patient tolerated treatment  well Patient left: in chair;with call bell/phone within reach;with chair alarm set;with family/visitor present Nurse Communication: Mobility status PT Visit Diagnosis: Muscle weakness (generalized) (M62.81);Difficulty in walking, not elsewhere classified (R26.2)     Time: 6770-3403 PT Time Calculation (min) (ACUTE ONLY): 36 min  Charges:  $Gait Training: 8-22 mins $Therapeutic Activity: 8-22 mins                    G Codes:       Anona Giovannini H. Owens Shark, PT, DPT, NCS 02/17/17, 10:01 AM [phone]

## 2017-02-18 ENCOUNTER — Encounter: Payer: Self-pay | Admitting: Orthopedic Surgery

## 2017-02-18 DIAGNOSIS — M159 Polyosteoarthritis, unspecified: Secondary | ICD-10-CM | POA: Insufficient documentation

## 2017-02-18 DIAGNOSIS — M8949 Other hypertrophic osteoarthropathy, multiple sites: Secondary | ICD-10-CM | POA: Insufficient documentation

## 2017-02-18 DIAGNOSIS — I10 Essential (primary) hypertension: Secondary | ICD-10-CM | POA: Diagnosis not present

## 2017-02-18 DIAGNOSIS — E039 Hypothyroidism, unspecified: Secondary | ICD-10-CM | POA: Diagnosis not present

## 2017-02-18 DIAGNOSIS — M150 Primary generalized (osteo)arthritis: Secondary | ICD-10-CM | POA: Diagnosis not present

## 2017-02-23 ENCOUNTER — Encounter
Admission: RE | Admit: 2017-02-23 | Discharge: 2017-02-23 | Disposition: A | Discharge status: Home / Self Care | Payer: PPO | Source: Ambulatory Visit | Attending: Internal Medicine | Admitting: Internal Medicine

## 2017-02-23 DIAGNOSIS — Z471 Aftercare following joint replacement surgery: Secondary | ICD-10-CM | POA: Diagnosis not present

## 2017-02-23 DIAGNOSIS — J309 Allergic rhinitis, unspecified: Secondary | ICD-10-CM | POA: Diagnosis not present

## 2017-02-23 DIAGNOSIS — M6281 Muscle weakness (generalized): Secondary | ICD-10-CM | POA: Diagnosis not present

## 2017-02-23 DIAGNOSIS — E039 Hypothyroidism, unspecified: Secondary | ICD-10-CM | POA: Diagnosis not present

## 2017-02-23 DIAGNOSIS — H409 Unspecified glaucoma: Secondary | ICD-10-CM | POA: Diagnosis not present

## 2017-02-23 DIAGNOSIS — E785 Hyperlipidemia, unspecified: Secondary | ICD-10-CM | POA: Diagnosis not present

## 2017-02-23 DIAGNOSIS — R262 Difficulty in walking, not elsewhere classified: Secondary | ICD-10-CM | POA: Diagnosis not present

## 2017-02-23 DIAGNOSIS — Z96651 Presence of right artificial knee joint: Secondary | ICD-10-CM | POA: Diagnosis not present

## 2017-03-03 DIAGNOSIS — M25661 Stiffness of right knee, not elsewhere classified: Secondary | ICD-10-CM | POA: Diagnosis not present

## 2017-03-03 DIAGNOSIS — M25561 Pain in right knee: Secondary | ICD-10-CM | POA: Diagnosis not present

## 2017-03-03 DIAGNOSIS — Z96651 Presence of right artificial knee joint: Secondary | ICD-10-CM | POA: Diagnosis not present

## 2017-03-03 DIAGNOSIS — M6281 Muscle weakness (generalized): Secondary | ICD-10-CM | POA: Diagnosis not present

## 2017-03-03 DIAGNOSIS — Z96652 Presence of left artificial knee joint: Secondary | ICD-10-CM | POA: Diagnosis not present

## 2017-03-05 DIAGNOSIS — M6281 Muscle weakness (generalized): Secondary | ICD-10-CM | POA: Diagnosis not present

## 2017-03-05 DIAGNOSIS — Z96651 Presence of right artificial knee joint: Secondary | ICD-10-CM | POA: Diagnosis not present

## 2017-03-05 DIAGNOSIS — M25661 Stiffness of right knee, not elsewhere classified: Secondary | ICD-10-CM | POA: Diagnosis not present

## 2017-03-05 DIAGNOSIS — M25561 Pain in right knee: Secondary | ICD-10-CM | POA: Diagnosis not present

## 2017-03-08 DIAGNOSIS — M6281 Muscle weakness (generalized): Secondary | ICD-10-CM | POA: Diagnosis not present

## 2017-03-08 DIAGNOSIS — M25561 Pain in right knee: Secondary | ICD-10-CM | POA: Diagnosis not present

## 2017-03-08 DIAGNOSIS — M25661 Stiffness of right knee, not elsewhere classified: Secondary | ICD-10-CM | POA: Diagnosis not present

## 2017-03-08 DIAGNOSIS — Z96651 Presence of right artificial knee joint: Secondary | ICD-10-CM | POA: Diagnosis not present

## 2017-03-10 DIAGNOSIS — Z96651 Presence of right artificial knee joint: Secondary | ICD-10-CM | POA: Diagnosis not present

## 2017-03-15 DIAGNOSIS — Z96651 Presence of right artificial knee joint: Secondary | ICD-10-CM | POA: Diagnosis not present

## 2017-03-17 DIAGNOSIS — M25661 Stiffness of right knee, not elsewhere classified: Secondary | ICD-10-CM | POA: Diagnosis not present

## 2017-03-17 DIAGNOSIS — M6281 Muscle weakness (generalized): Secondary | ICD-10-CM | POA: Diagnosis not present

## 2017-03-17 DIAGNOSIS — Z96651 Presence of right artificial knee joint: Secondary | ICD-10-CM | POA: Diagnosis not present

## 2017-03-17 DIAGNOSIS — M25561 Pain in right knee: Secondary | ICD-10-CM | POA: Diagnosis not present

## 2017-03-23 DIAGNOSIS — Z96651 Presence of right artificial knee joint: Secondary | ICD-10-CM | POA: Diagnosis not present

## 2017-03-25 DIAGNOSIS — Z96651 Presence of right artificial knee joint: Secondary | ICD-10-CM | POA: Diagnosis not present

## 2017-03-29 DIAGNOSIS — M6281 Muscle weakness (generalized): Secondary | ICD-10-CM | POA: Diagnosis not present

## 2017-03-29 DIAGNOSIS — M25561 Pain in right knee: Secondary | ICD-10-CM | POA: Diagnosis not present

## 2017-03-29 DIAGNOSIS — Z96651 Presence of right artificial knee joint: Secondary | ICD-10-CM | POA: Diagnosis not present

## 2017-03-29 DIAGNOSIS — M25661 Stiffness of right knee, not elsewhere classified: Secondary | ICD-10-CM | POA: Diagnosis not present

## 2017-03-30 DIAGNOSIS — Z96651 Presence of right artificial knee joint: Secondary | ICD-10-CM | POA: Diagnosis not present

## 2017-04-26 DIAGNOSIS — K219 Gastro-esophageal reflux disease without esophagitis: Secondary | ICD-10-CM | POA: Diagnosis not present

## 2017-04-26 DIAGNOSIS — E7800 Pure hypercholesterolemia, unspecified: Secondary | ICD-10-CM | POA: Diagnosis not present

## 2017-04-26 DIAGNOSIS — E039 Hypothyroidism, unspecified: Secondary | ICD-10-CM | POA: Diagnosis not present

## 2017-04-26 DIAGNOSIS — G2581 Restless legs syndrome: Secondary | ICD-10-CM | POA: Diagnosis not present

## 2017-04-26 DIAGNOSIS — J301 Allergic rhinitis due to pollen: Secondary | ICD-10-CM | POA: Diagnosis not present

## 2017-04-26 DIAGNOSIS — W57XXXA Bitten or stung by nonvenomous insect and other nonvenomous arthropods, initial encounter: Secondary | ICD-10-CM | POA: Diagnosis not present

## 2017-04-26 DIAGNOSIS — S80862A Insect bite (nonvenomous), left lower leg, initial encounter: Secondary | ICD-10-CM | POA: Diagnosis not present

## 2017-05-24 DIAGNOSIS — J0110 Acute frontal sinusitis, unspecified: Secondary | ICD-10-CM | POA: Diagnosis not present

## 2017-05-28 ENCOUNTER — Other Ambulatory Visit: Payer: Self-pay | Admitting: Family Medicine

## 2017-05-28 DIAGNOSIS — Z1231 Encounter for screening mammogram for malignant neoplasm of breast: Secondary | ICD-10-CM

## 2017-06-10 ENCOUNTER — Ambulatory Visit
Admission: RE | Admit: 2017-06-10 | Discharge: 2017-06-10 | Disposition: A | Discharge status: Home / Self Care | Payer: PPO | Source: Ambulatory Visit | Attending: Family Medicine | Admitting: Family Medicine

## 2017-06-10 DIAGNOSIS — Z1231 Encounter for screening mammogram for malignant neoplasm of breast: Secondary | ICD-10-CM | POA: Insufficient documentation

## 2017-06-24 DIAGNOSIS — L708 Other acne: Secondary | ICD-10-CM | POA: Diagnosis not present

## 2017-06-24 DIAGNOSIS — D225 Melanocytic nevi of trunk: Secondary | ICD-10-CM | POA: Diagnosis not present

## 2017-06-24 DIAGNOSIS — Z85828 Personal history of other malignant neoplasm of skin: Secondary | ICD-10-CM | POA: Diagnosis not present

## 2017-06-24 DIAGNOSIS — L821 Other seborrheic keratosis: Secondary | ICD-10-CM | POA: Diagnosis not present

## 2017-07-12 DIAGNOSIS — H353132 Nonexudative age-related macular degeneration, bilateral, intermediate dry stage: Secondary | ICD-10-CM | POA: Diagnosis not present

## 2017-07-16 DIAGNOSIS — Z23 Encounter for immunization: Secondary | ICD-10-CM | POA: Diagnosis not present

## 2017-08-23 DIAGNOSIS — H401132 Primary open-angle glaucoma, bilateral, moderate stage: Secondary | ICD-10-CM | POA: Diagnosis not present

## 2017-08-31 IMAGING — MG DIGITAL SCREENING BILATERAL MAMMOGRAM WITH CAD
4 series · 4 of 4 positions shown · non-contrast
Comparison: Previous exam(s).

CLINICAL DATA: Screening.

EXAM:
DIGITAL SCREENING BILATERAL MAMMOGRAM WITH CAD

[R CC]
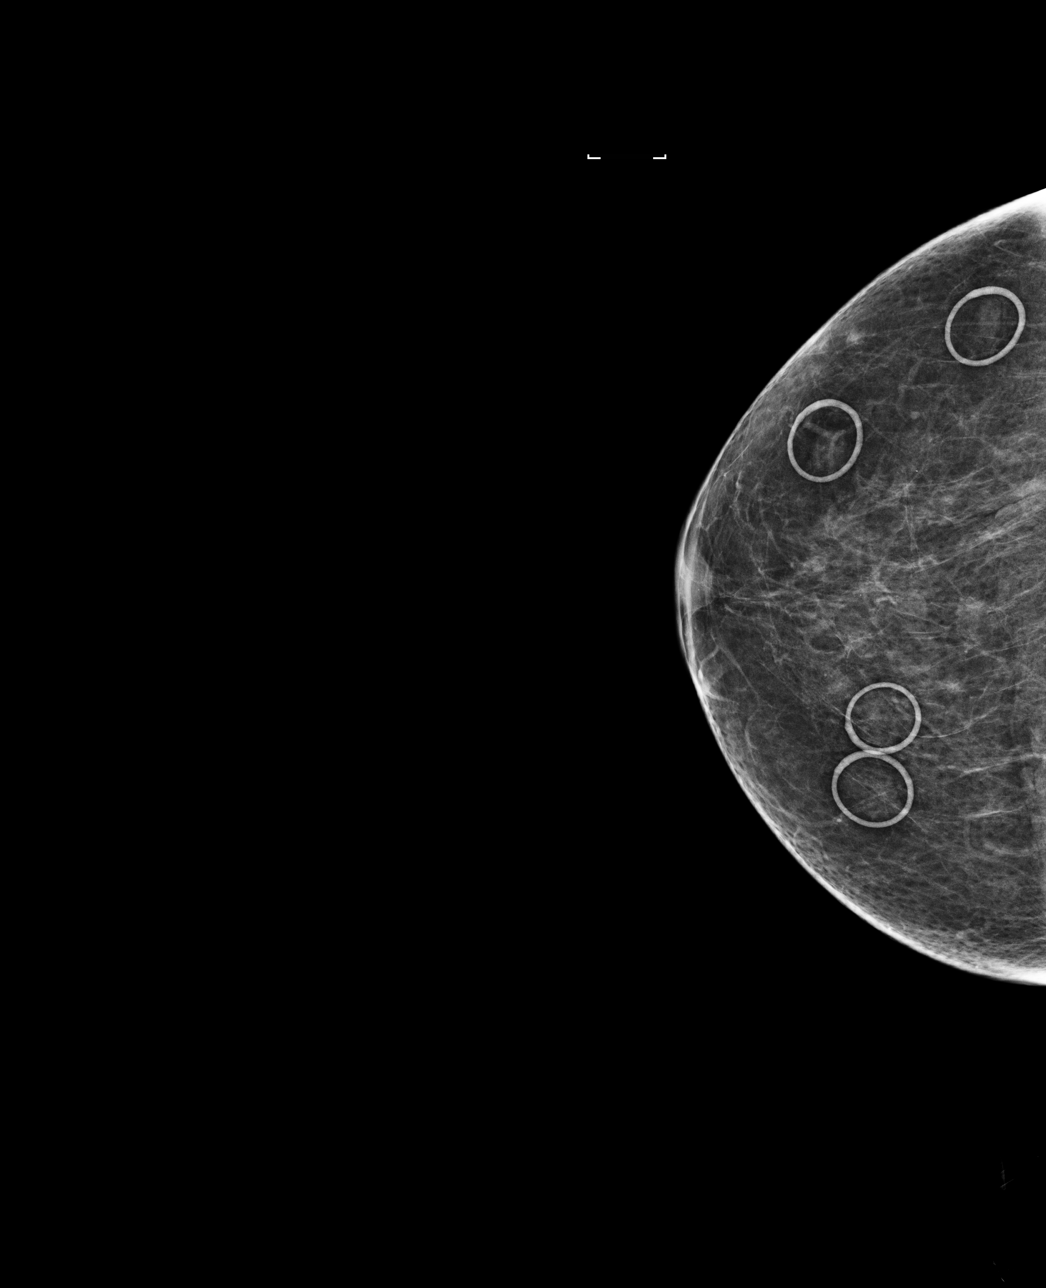

[L MLO]
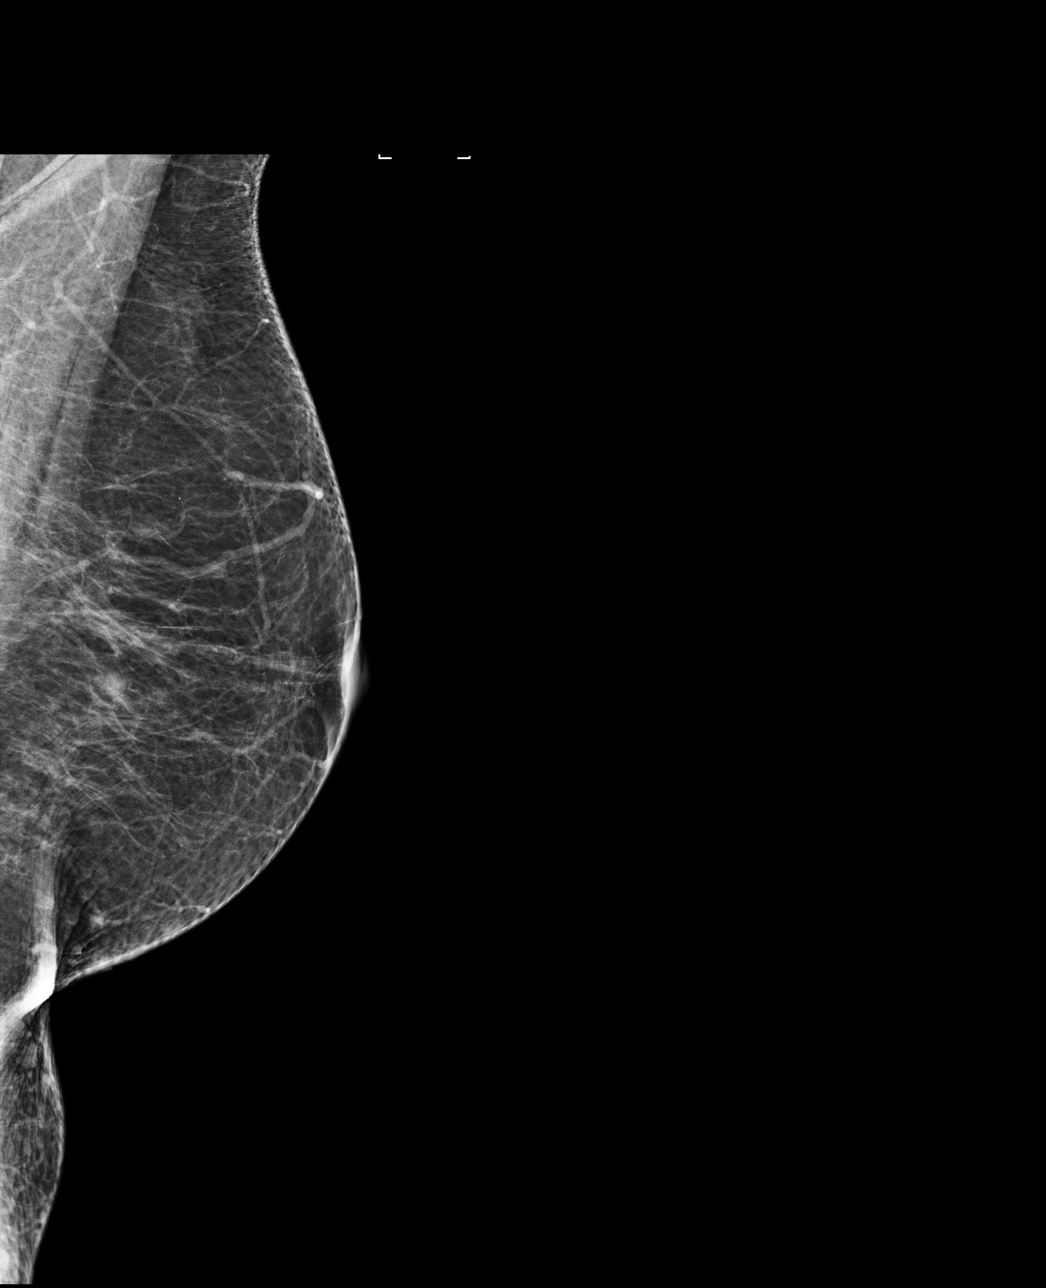

[L CC]
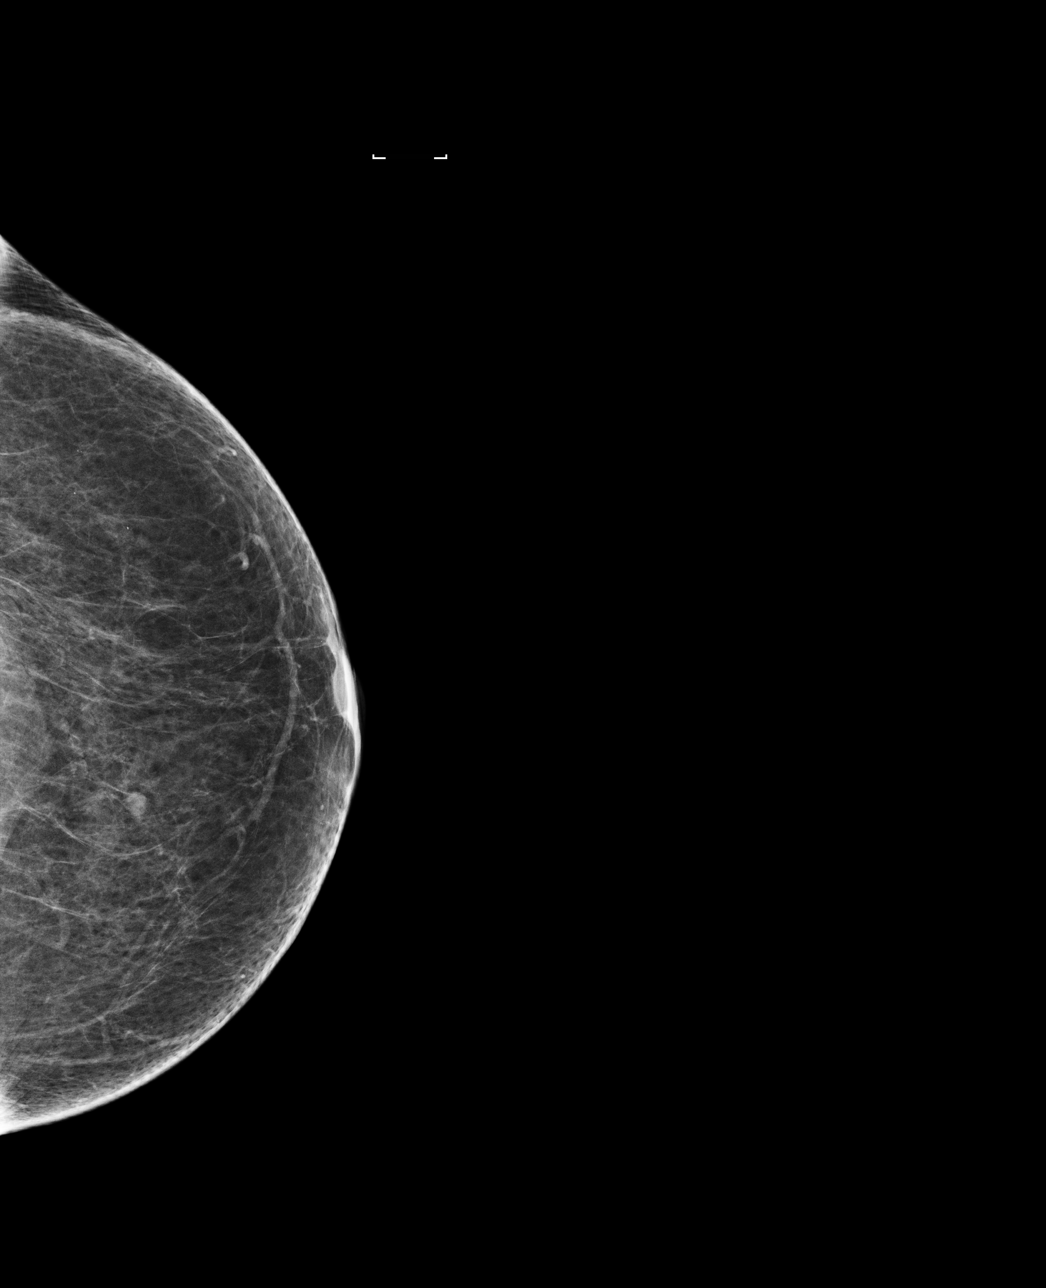

[R MLO]
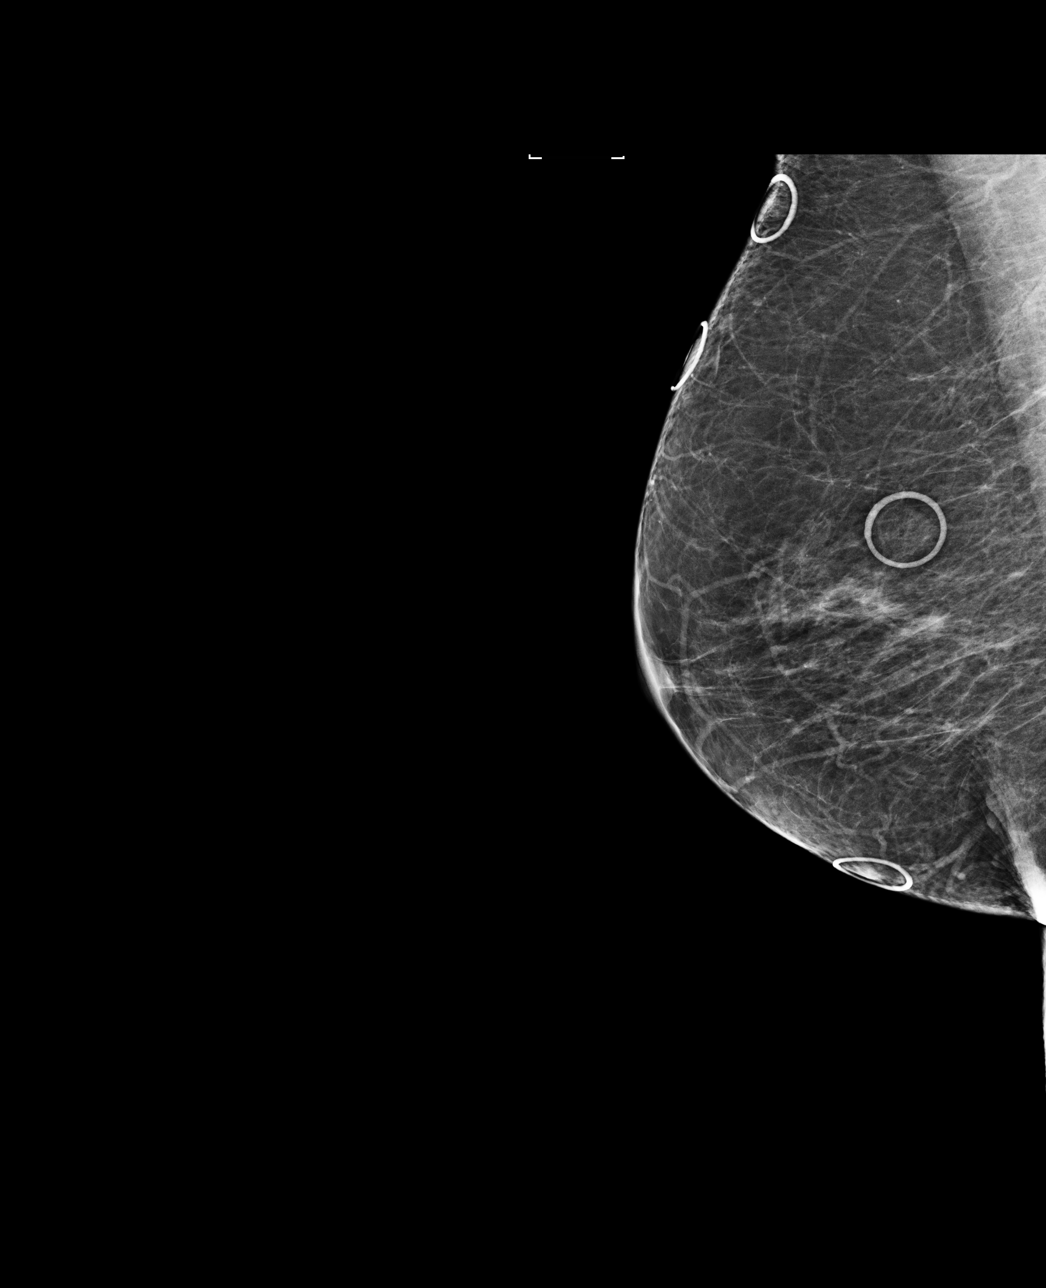

[4 of 4 positions shown; findings below may reference images not displayed]

ACR Breast Density Category b: There are scattered areas of
fibroglandular density.
FINDINGS: In the left breast, a possible mass warrants further evaluation. In
the right breast, no findings suspicious for malignancy.

Images were processed with CAD.
IMPRESSION: Further evaluation is suggested for possible mass in the left
breast.

RECOMMENDATION:
Diagnostic mammogram and possibly ultrasound of the left breast.
(Code:5M-3-PPT)

The patient will be contacted regarding the findings, and additional
imaging will be scheduled.

BI-RADS CATEGORY  0: Incomplete. Need additional imaging evaluation
and/or prior mammograms for comparison.

## 2017-09-23 DIAGNOSIS — H401132 Primary open-angle glaucoma, bilateral, moderate stage: Secondary | ICD-10-CM | POA: Diagnosis not present

## 2017-11-12 DIAGNOSIS — G2581 Restless legs syndrome: Secondary | ICD-10-CM | POA: Diagnosis not present

## 2017-11-12 DIAGNOSIS — E039 Hypothyroidism, unspecified: Secondary | ICD-10-CM | POA: Diagnosis not present

## 2017-11-12 DIAGNOSIS — K219 Gastro-esophageal reflux disease without esophagitis: Secondary | ICD-10-CM | POA: Diagnosis not present

## 2017-11-12 DIAGNOSIS — J301 Allergic rhinitis due to pollen: Secondary | ICD-10-CM | POA: Diagnosis not present

## 2017-11-12 DIAGNOSIS — Z0000 Encounter for general adult medical examination without abnormal findings: Secondary | ICD-10-CM | POA: Diagnosis not present

## 2017-11-12 DIAGNOSIS — E7800 Pure hypercholesterolemia, unspecified: Secondary | ICD-10-CM | POA: Diagnosis not present

## 2017-12-27 DIAGNOSIS — H401132 Primary open-angle glaucoma, bilateral, moderate stage: Secondary | ICD-10-CM | POA: Diagnosis not present

## 2018-01-03 DIAGNOSIS — H401133 Primary open-angle glaucoma, bilateral, severe stage: Secondary | ICD-10-CM | POA: Diagnosis not present

## 2018-02-03 DIAGNOSIS — Z96653 Presence of artificial knee joint, bilateral: Secondary | ICD-10-CM | POA: Diagnosis not present

## 2018-02-17 DIAGNOSIS — H401133 Primary open-angle glaucoma, bilateral, severe stage: Secondary | ICD-10-CM | POA: Diagnosis not present

## 2018-04-13 ENCOUNTER — Encounter: Payer: Self-pay | Admitting: *Deleted

## 2018-04-13 ENCOUNTER — Other Ambulatory Visit: Payer: Self-pay

## 2018-04-13 NOTE — Discharge Instructions (Signed)
General Anesthesia, Adult, Care After °These instructions provide you with information about caring for yourself after your procedure. Your health care provider may also give you more specific instructions. Your treatment has been planned according to current medical practices, but problems sometimes occur. Call your health care provider if you have any problems or questions after your procedure. °What can I expect after the procedure? °After the procedure, it is common to have: °· Vomiting. °· A sore throat. °· Mental slowness. ° °It is common to feel: °· Nauseous. °· Cold or shivery. °· Sleepy. °· Tired. °· Sore or achy, even in parts of your body where you did not have surgery. ° °Follow these instructions at home: °For at least 24 hours after the procedure: °· Do not: °? Participate in activities where you could fall or become injured. °? Drive. °? Use heavy machinery. °? Drink alcohol. °? Take sleeping pills or medicines that cause drowsiness. °? Make important decisions or sign legal documents. °? Take care of children on your own. °· Rest. °Eating and drinking °· If you vomit, drink water, juice, or soup when you can drink without vomiting. °· Drink enough fluid to keep your urine clear or pale yellow. °· Make sure you have little or no nausea before eating solid foods. °· Follow the diet recommended by your health care provider. °General instructions °· Have a responsible adult stay with you until you are awake and alert. °· Return to your normal activities as told by your health care provider. Ask your health care provider what activities are safe for you. °· Take over-the-counter and prescription medicines only as told by your health care provider. °· If you smoke, do not smoke without supervision. °· Keep all follow-up visits as told by your health care provider. This is important. °Contact a health care provider if: °· You continue to have nausea or vomiting at home, and medicines are not helpful. °· You  cannot drink fluids or start eating again. °· You cannot urinate after 8-12 hours. °· You develop a skin rash. °· You have fever. °· You have increasing redness at the site of your procedure. °Get help right away if: °· You have difficulty breathing. °· You have chest pain. °· You have unexpected bleeding. °· You feel that you are having a life-threatening or urgent problem. °This information is not intended to replace advice given to you by your health care provider. Make sure you discuss any questions you have with your health care provider. °Document Released: 01/18/2001 Document Revised: 03/16/2016 Document Reviewed: 09/26/2015 °Elsevier Interactive Patient Education © 2018 [employer] ° °

## 2018-04-20 ENCOUNTER — Ambulatory Visit: Payer: PPO | Admitting: Anesthesiology

## 2018-04-20 ENCOUNTER — Encounter
Admission: RE | Disposition: A | Discharge status: Home / Self Care | Payer: Self-pay | Source: Ambulatory Visit | Attending: Ophthalmology

## 2018-04-20 ENCOUNTER — Ambulatory Visit
Admission: RE | Admit: 2018-04-20 | Discharge: 2018-04-20 | Disposition: A | Discharge status: Home / Self Care | Payer: PPO | Source: Ambulatory Visit | Attending: Ophthalmology | Admitting: Ophthalmology

## 2018-04-20 DIAGNOSIS — H401133 Primary open-angle glaucoma, bilateral, severe stage: Secondary | ICD-10-CM | POA: Diagnosis not present

## 2018-04-20 DIAGNOSIS — Z79899 Other long term (current) drug therapy: Secondary | ICD-10-CM | POA: Insufficient documentation

## 2018-04-20 DIAGNOSIS — Z7982 Long term (current) use of aspirin: Secondary | ICD-10-CM | POA: Insufficient documentation

## 2018-04-20 DIAGNOSIS — E785 Hyperlipidemia, unspecified: Secondary | ICD-10-CM | POA: Diagnosis not present

## 2018-04-20 DIAGNOSIS — E039 Hypothyroidism, unspecified: Secondary | ICD-10-CM | POA: Insufficient documentation

## 2018-04-20 DIAGNOSIS — K219 Gastro-esophageal reflux disease without esophagitis: Secondary | ICD-10-CM | POA: Insufficient documentation

## 2018-04-20 DIAGNOSIS — H401113 Primary open-angle glaucoma, right eye, severe stage: Secondary | ICD-10-CM | POA: Diagnosis not present

## 2018-04-20 DIAGNOSIS — I10 Essential (primary) hypertension: Secondary | ICD-10-CM | POA: Insufficient documentation

## 2018-04-20 DIAGNOSIS — G2581 Restless legs syndrome: Secondary | ICD-10-CM | POA: Diagnosis not present

## 2018-04-20 HISTORY — PX: PHOTOCOAGULATION WITH LASER: SHX6027

## 2018-04-20 HISTORY — DX: Nausea with vomiting, unspecified: R11.2

## 2018-04-20 HISTORY — DX: Other specified postprocedural states: Z98.890

## 2018-04-20 SURGERY — PHOTOCOAGULATION, EYE, USING LASER
Anesthesia: Monitor Anesthesia Care | Site: Eye | Laterality: Right | Wound class: "Clean "

## 2018-04-20 MED ORDER — CYCLOPENTOLATE HCL 2 % OP SOLN
OPHTHALMIC | Status: DC | PRN
  Administered 2018-04-20: 1 [drp] via OPHTHALMIC

## 2018-04-20 MED ORDER — ONDANSETRON HCL 4 MG/2ML IJ SOLN
INTRAMUSCULAR | Status: DC | PRN
  Administered 2018-04-20: 4 mg via INTRAVENOUS

## 2018-04-20 MED ORDER — LACTATED RINGERS IV SOLN: 10.0000 mL/h | INTRAVENOUS | Status: DC

## 2018-04-20 MED ORDER — LIDOCAINE HCL 2 % IJ SOLN
INTRAMUSCULAR | Status: DC | PRN
  Administered 2018-04-20: 3.5 mL via OPHTHALMIC

## 2018-04-20 MED ORDER — MIDAZOLAM HCL 2 MG/2ML IJ SOLN
INTRAMUSCULAR | Status: DC | PRN
  Administered 2018-04-20: 0.5 mg via INTRAVENOUS

## 2018-04-20 MED ORDER — NEOMYCIN-POLYMYXIN-DEXAMETH 3.5-10000-0.1 OP OINT
TOPICAL_OINTMENT | OPHTHALMIC | Status: DC | PRN
  Administered 2018-04-20: 1 via OPHTHALMIC

## 2018-04-20 MED ORDER — ONDANSETRON HCL 4 MG/2ML IJ SOLN
4.0000 mg | Freq: Once | INTRAMUSCULAR | Status: DC | PRN
Start: 2018-04-20 — End: 2018-04-20

## 2018-04-20 MED ORDER — ALFENTANIL 500 MCG/ML IJ INJ
INJECTION | INTRAVENOUS | Status: DC | PRN
  Administered 2018-04-20: 200 ug via INTRAVENOUS
  Administered 2018-04-20: 500 ug via INTRAVENOUS

## 2018-04-20 SURGICAL SUPPLY — 10 items
BANDAGE EYE OVAL (MISCELLANEOUS) ×6 IMPLANT
DEVICE MICRO PULS P3 SGL USE (Laser) ×3 IMPLANT
GAUZE SPONGE 4X4 12PLY STRL (GAUZE/BANDAGES/DRESSINGS) ×3 IMPLANT
NDL FILTER BLUNT 18X1 1/2 (NEEDLE) ×1 IMPLANT
NDL RETROBULBAR .5 NSTRL (NEEDLE) ×3 IMPLANT
NEEDLE FILTER BLUNT 18X 1/2SAF (NEEDLE) ×2
NEEDLE FILTER BLUNT 18X1 1/2 (NEEDLE) ×1 IMPLANT
PACK EYE AFTER SURG (MISCELLANEOUS) ×2 IMPLANT
SYR 5ML LL (SYRINGE) ×3 IMPLANT
WATER STERILE IRR 250ML POUR (IV SOLUTION) ×3 IMPLANT

## 2018-04-20 NOTE — Anesthesia Preprocedure Evaluation (Addendum)
Anesthesia Evaluation  Patient identified by MRN, date of birth, ID band Patient awake    Reviewed: Allergy & Precautions, NPO status , Patient's Chart, lab work & pertinent test results  History of Anesthesia Complications (+) PONV  Airway Mallampati: II  TM Distance: >3 FB     Dental   Pulmonary neg pulmonary ROS, neg recent URI,    breath sounds clear to auscultation       Cardiovascular hypertension,  Rhythm:Regular Rate:Normal  HLD   Neuro/Psych Restless leg syndrome    GI/Hepatic GERD  ,  Endo/Other  Hypothyroidism BMI 31  Renal/GU      Musculoskeletal  (+) Arthritis ,   Abdominal   Peds  Hematology   Anesthesia Other Findings   Reproductive/Obstetrics                            Anesthesia Physical Anesthesia Plan  ASA: II  Anesthesia Plan: MAC   Post-op Pain Management:    Induction: Intravenous  PONV Risk Score and Plan:   Airway Management Planned: Nasal Cannula  Additional Equipment:   Intra-op Plan:   Post-operative Plan:   Informed Consent: I have reviewed the patients History and Physical, chart, labs and discussed the procedure including the risks, benefits and alternatives for the proposed anesthesia with the patient or authorized representative who has indicated his/her understanding and acceptance.     Plan Discussed with: CRNA  Anesthesia Plan Comments:         Anesthesia Quick Evaluation

## 2018-04-20 NOTE — H&P (Signed)
The History and Physical notes are on paper, have been signed, and are to be scanned. The patient remains stable and unchanged from the H&P.   Previous H&P reviewed, patient examined, and there are no changes.  Maria Trujillo 04/20/2018 9:40 AM

## 2018-04-20 NOTE — Anesthesia Postprocedure Evaluation (Signed)
Anesthesia Post Note  Patient: Maria Trujillo  Procedure(s) Performed: TRANSCLERAL DIODE CYCLOPHOTOCOAGULATION  RIGHT per Hope block (Right Eye)  Patient location during evaluation: PACU Anesthesia Type: MAC Level of consciousness: awake and alert Pain management: pain level controlled Vital Signs Assessment: post-procedure vital signs reviewed and stable Respiratory status: spontaneous breathing, nonlabored ventilation, respiratory function stable and patient connected to nasal cannula oxygen Cardiovascular status: stable and blood pressure returned to baseline Postop Assessment: no apparent nausea or vomiting Anesthetic complications: no    Veda Canning

## 2018-04-20 NOTE — Op Note (Signed)
DATE OF SURGERY: 04/20/2018  PREOPERATIVE DIAGNOSES: Severe stage primary open angle glaucoma, right eye.      H40.11x3  POSTOPERATIVE DIAGNOSES: Same  PROCEDURES PERFORMED: Transscleral diode cyclophotocoagulation, right eye  SURGEON: Mali Meekah Math, M.D.  ANESTHESIA: Retrobulbar block of Xylocaine and Bupivacaine and Hyaluronidase  COMPLICATIONS: None.  INDICATIONS FOR PROCEDURE: Maria Trujillo is a 84 y.o. year-old female with uncontrolled primary open angle glaucoma. The risks and benefits of glaucoma surgery were discussed with the patient, and she consented for a diode laser surgery.  PROCEDURE IN DETAIL: The eye for surgery was verified during the time-out procedure in the operating room. A retrobulbar block of lidocaine, Marcaine, and hyaluronidase was done for anesthesia. A micropulse probe was applied to each hemilimbus with the following settings: 2079mW, 31.3% duty cycle, 120 seconds. Cyclogyl 25 drops and Maxitrol ointment were applied. The eye was pressure patched closed. The patient tolerated the procedure well and was transferred to the Post-operative Care Unit in stable condition.

## 2018-04-20 NOTE — Anesthesia Procedure Notes (Signed)
Procedure Name: MAC Performed by: Sharnette Kitamura, CRNA Pre-anesthesia Checklist: Patient identified, Emergency Drugs available, Suction available, Timeout performed and Patient being monitored Patient Re-evaluated:Patient Re-evaluated prior to induction Oxygen Delivery Method: Nasal cannula Placement Confirmation: positive ETCO2       

## 2018-04-20 NOTE — Transfer of Care (Signed)
Immediate Anesthesia Transfer of Care Note  Patient: Maria Trujillo  Procedure(s) Performed: TRANSCLERAL DIODE CYCLOPHOTOCOAGULATION  RIGHT per Hope block (Right Eye)  Patient Location: PACU  Anesthesia Type: MAC  Level of Consciousness: awake, alert  and patient cooperative  Airway and Oxygen Therapy: Patient Spontanous Breathing and Patient connected to supplemental oxygen  Post-op Assessment: Post-op Vital signs reviewed, Patient's Cardiovascular Status Stable, Respiratory Function Stable, Patent Airway and No signs of Nausea or vomiting  Post-op Vital Signs: Reviewed and stable  Complications: No apparent anesthesia complications

## 2018-04-21 ENCOUNTER — Encounter: Payer: Self-pay | Admitting: Ophthalmology

## 2018-04-27 DIAGNOSIS — H31401 Unspecified choroidal detachment, right eye: Secondary | ICD-10-CM | POA: Diagnosis not present

## 2018-05-02 DIAGNOSIS — H31401 Unspecified choroidal detachment, right eye: Secondary | ICD-10-CM | POA: Diagnosis not present

## 2018-05-16 DIAGNOSIS — H31401 Unspecified choroidal detachment, right eye: Secondary | ICD-10-CM | POA: Diagnosis not present

## 2018-05-19 DIAGNOSIS — E039 Hypothyroidism, unspecified: Secondary | ICD-10-CM | POA: Diagnosis not present

## 2018-05-19 DIAGNOSIS — J301 Allergic rhinitis due to pollen: Secondary | ICD-10-CM | POA: Diagnosis not present

## 2018-05-19 DIAGNOSIS — K219 Gastro-esophageal reflux disease without esophagitis: Secondary | ICD-10-CM | POA: Diagnosis not present

## 2018-05-19 DIAGNOSIS — G2581 Restless legs syndrome: Secondary | ICD-10-CM | POA: Diagnosis not present

## 2018-05-19 DIAGNOSIS — E7800 Pure hypercholesterolemia, unspecified: Secondary | ICD-10-CM | POA: Diagnosis not present

## 2018-06-13 DIAGNOSIS — H31401 Unspecified choroidal detachment, right eye: Secondary | ICD-10-CM | POA: Diagnosis not present

## 2018-06-23 DIAGNOSIS — Z85828 Personal history of other malignant neoplasm of skin: Secondary | ICD-10-CM | POA: Diagnosis not present

## 2018-06-23 DIAGNOSIS — X32XXXA Exposure to sunlight, initial encounter: Secondary | ICD-10-CM | POA: Diagnosis not present

## 2018-06-23 DIAGNOSIS — Z08 Encounter for follow-up examination after completed treatment for malignant neoplasm: Secondary | ICD-10-CM | POA: Diagnosis not present

## 2018-06-23 DIAGNOSIS — L821 Other seborrheic keratosis: Secondary | ICD-10-CM | POA: Diagnosis not present

## 2018-06-23 DIAGNOSIS — L570 Actinic keratosis: Secondary | ICD-10-CM | POA: Diagnosis not present

## 2018-06-23 DIAGNOSIS — L304 Erythema intertrigo: Secondary | ICD-10-CM | POA: Diagnosis not present

## 2018-07-19 DIAGNOSIS — H4311 Vitreous hemorrhage, right eye: Secondary | ICD-10-CM | POA: Diagnosis not present

## 2018-07-27 DIAGNOSIS — E039 Hypothyroidism, unspecified: Secondary | ICD-10-CM | POA: Diagnosis not present

## 2018-07-27 DIAGNOSIS — Z96653 Presence of artificial knee joint, bilateral: Secondary | ICD-10-CM | POA: Diagnosis not present

## 2018-07-27 DIAGNOSIS — Z888 Allergy status to other drugs, medicaments and biological substances status: Secondary | ICD-10-CM | POA: Diagnosis not present

## 2018-07-27 DIAGNOSIS — Z880 Allergy status to penicillin: Secondary | ICD-10-CM | POA: Diagnosis not present

## 2018-07-27 DIAGNOSIS — H409 Unspecified glaucoma: Secondary | ICD-10-CM | POA: Diagnosis not present

## 2018-07-27 DIAGNOSIS — Z882 Allergy status to sulfonamides status: Secondary | ICD-10-CM | POA: Diagnosis not present

## 2018-07-27 DIAGNOSIS — Z885 Allergy status to narcotic agent status: Secondary | ICD-10-CM | POA: Diagnosis not present

## 2018-07-27 DIAGNOSIS — H4311 Vitreous hemorrhage, right eye: Secondary | ICD-10-CM | POA: Diagnosis not present

## 2018-07-27 DIAGNOSIS — Z79899 Other long term (current) drug therapy: Secondary | ICD-10-CM | POA: Diagnosis not present

## 2018-07-28 DIAGNOSIS — H4311 Vitreous hemorrhage, right eye: Secondary | ICD-10-CM | POA: Diagnosis not present

## 2018-08-01 ENCOUNTER — Other Ambulatory Visit: Payer: Self-pay | Admitting: Family Medicine

## 2018-08-01 DIAGNOSIS — Z1231 Encounter for screening mammogram for malignant neoplasm of breast: Secondary | ICD-10-CM

## 2018-08-05 DIAGNOSIS — Z23 Encounter for immunization: Secondary | ICD-10-CM | POA: Diagnosis not present

## 2018-08-19 ENCOUNTER — Ambulatory Visit
Admission: RE | Admit: 2018-08-19 | Discharge: 2018-08-19 | Disposition: A | Discharge status: Home / Self Care | Payer: PPO | Source: Ambulatory Visit | Attending: Family Medicine | Admitting: Family Medicine

## 2018-08-19 DIAGNOSIS — Z1231 Encounter for screening mammogram for malignant neoplasm of breast: Secondary | ICD-10-CM | POA: Diagnosis not present

## 2018-08-30 DIAGNOSIS — M25511 Pain in right shoulder: Secondary | ICD-10-CM | POA: Diagnosis not present

## 2018-08-30 DIAGNOSIS — M19011 Primary osteoarthritis, right shoulder: Secondary | ICD-10-CM | POA: Diagnosis not present

## 2018-09-30 DIAGNOSIS — H401133 Primary open-angle glaucoma, bilateral, severe stage: Secondary | ICD-10-CM | POA: Diagnosis not present

## 2018-09-30 DIAGNOSIS — H3530 Unspecified macular degeneration: Secondary | ICD-10-CM | POA: Diagnosis not present

## 2018-09-30 DIAGNOSIS — Z961 Presence of intraocular lens: Secondary | ICD-10-CM | POA: Diagnosis not present

## 2018-11-01 DIAGNOSIS — Z961 Presence of intraocular lens: Secondary | ICD-10-CM | POA: Diagnosis not present

## 2018-11-01 DIAGNOSIS — H3530 Unspecified macular degeneration: Secondary | ICD-10-CM | POA: Diagnosis not present

## 2018-11-01 DIAGNOSIS — H401133 Primary open-angle glaucoma, bilateral, severe stage: Secondary | ICD-10-CM | POA: Insufficient documentation

## 2018-11-03 DIAGNOSIS — Z880 Allergy status to penicillin: Secondary | ICD-10-CM | POA: Diagnosis not present

## 2018-11-03 DIAGNOSIS — Z885 Allergy status to narcotic agent status: Secondary | ICD-10-CM | POA: Diagnosis not present

## 2018-11-03 DIAGNOSIS — Z9841 Cataract extraction status, right eye: Secondary | ICD-10-CM | POA: Diagnosis not present

## 2018-11-03 DIAGNOSIS — Z96659 Presence of unspecified artificial knee joint: Secondary | ICD-10-CM | POA: Diagnosis not present

## 2018-11-03 DIAGNOSIS — Z888 Allergy status to other drugs, medicaments and biological substances status: Secondary | ICD-10-CM | POA: Diagnosis not present

## 2018-11-03 DIAGNOSIS — H401133 Primary open-angle glaucoma, bilateral, severe stage: Secondary | ICD-10-CM | POA: Diagnosis not present

## 2018-11-03 DIAGNOSIS — E039 Hypothyroidism, unspecified: Secondary | ICD-10-CM | POA: Diagnosis not present

## 2018-11-03 DIAGNOSIS — H401113 Primary open-angle glaucoma, right eye, severe stage: Secondary | ICD-10-CM | POA: Diagnosis not present

## 2018-11-03 DIAGNOSIS — Z9842 Cataract extraction status, left eye: Secondary | ICD-10-CM | POA: Diagnosis not present

## 2018-11-03 DIAGNOSIS — Z79899 Other long term (current) drug therapy: Secondary | ICD-10-CM | POA: Diagnosis not present

## 2018-11-24 DIAGNOSIS — M25511 Pain in right shoulder: Secondary | ICD-10-CM | POA: Diagnosis not present

## 2018-11-24 DIAGNOSIS — E7800 Pure hypercholesterolemia, unspecified: Secondary | ICD-10-CM | POA: Diagnosis not present

## 2018-11-24 DIAGNOSIS — K219 Gastro-esophageal reflux disease without esophagitis: Secondary | ICD-10-CM | POA: Diagnosis not present

## 2018-11-24 DIAGNOSIS — E039 Hypothyroidism, unspecified: Secondary | ICD-10-CM | POA: Diagnosis not present

## 2018-11-24 DIAGNOSIS — J301 Allergic rhinitis due to pollen: Secondary | ICD-10-CM | POA: Diagnosis not present

## 2018-11-24 DIAGNOSIS — G2581 Restless legs syndrome: Secondary | ICD-10-CM | POA: Diagnosis not present

## 2018-12-05 DIAGNOSIS — M75121 Complete rotator cuff tear or rupture of right shoulder, not specified as traumatic: Secondary | ICD-10-CM | POA: Diagnosis not present

## 2018-12-05 DIAGNOSIS — M12811 Other specific arthropathies, not elsewhere classified, right shoulder: Secondary | ICD-10-CM | POA: Diagnosis not present

## 2019-02-09 DIAGNOSIS — Z96653 Presence of artificial knee joint, bilateral: Secondary | ICD-10-CM | POA: Diagnosis not present

## 2019-02-09 DIAGNOSIS — M170 Bilateral primary osteoarthritis of knee: Secondary | ICD-10-CM | POA: Diagnosis not present

## 2019-02-09 DIAGNOSIS — M7631 Iliotibial band syndrome, right leg: Secondary | ICD-10-CM | POA: Diagnosis not present

## 2019-02-20 DIAGNOSIS — Z961 Presence of intraocular lens: Secondary | ICD-10-CM | POA: Diagnosis not present

## 2019-02-20 DIAGNOSIS — H401133 Primary open-angle glaucoma, bilateral, severe stage: Secondary | ICD-10-CM | POA: Diagnosis not present

## 2019-02-20 DIAGNOSIS — H3530 Unspecified macular degeneration: Secondary | ICD-10-CM | POA: Diagnosis not present

## 2019-03-27 DIAGNOSIS — R5381 Other malaise: Secondary | ICD-10-CM | POA: Diagnosis not present

## 2019-03-27 DIAGNOSIS — J302 Other seasonal allergic rhinitis: Secondary | ICD-10-CM | POA: Diagnosis not present

## 2019-05-24 DIAGNOSIS — H3530 Unspecified macular degeneration: Secondary | ICD-10-CM | POA: Diagnosis not present

## 2019-05-24 DIAGNOSIS — H401133 Primary open-angle glaucoma, bilateral, severe stage: Secondary | ICD-10-CM | POA: Diagnosis not present

## 2019-05-24 DIAGNOSIS — Z961 Presence of intraocular lens: Secondary | ICD-10-CM | POA: Diagnosis not present

## 2019-05-25 DIAGNOSIS — Z0000 Encounter for general adult medical examination without abnormal findings: Secondary | ICD-10-CM | POA: Diagnosis not present

## 2019-05-25 DIAGNOSIS — J301 Allergic rhinitis due to pollen: Secondary | ICD-10-CM | POA: Diagnosis not present

## 2019-05-25 DIAGNOSIS — E7800 Pure hypercholesterolemia, unspecified: Secondary | ICD-10-CM | POA: Diagnosis not present

## 2019-05-25 DIAGNOSIS — E039 Hypothyroidism, unspecified: Secondary | ICD-10-CM | POA: Diagnosis not present

## 2019-05-25 DIAGNOSIS — K219 Gastro-esophageal reflux disease without esophagitis: Secondary | ICD-10-CM | POA: Diagnosis not present

## 2019-05-25 DIAGNOSIS — G2581 Restless legs syndrome: Secondary | ICD-10-CM | POA: Diagnosis not present

## 2019-06-29 DIAGNOSIS — L821 Other seborrheic keratosis: Secondary | ICD-10-CM | POA: Diagnosis not present

## 2019-06-29 DIAGNOSIS — Z85828 Personal history of other malignant neoplasm of skin: Secondary | ICD-10-CM | POA: Diagnosis not present

## 2019-06-29 DIAGNOSIS — Z08 Encounter for follow-up examination after completed treatment for malignant neoplasm: Secondary | ICD-10-CM | POA: Diagnosis not present

## 2019-06-29 DIAGNOSIS — D485 Neoplasm of uncertain behavior of skin: Secondary | ICD-10-CM | POA: Diagnosis not present

## 2019-06-29 DIAGNOSIS — D1801 Hemangioma of skin and subcutaneous tissue: Secondary | ICD-10-CM | POA: Diagnosis not present

## 2019-07-14 DIAGNOSIS — Z23 Encounter for immunization: Secondary | ICD-10-CM | POA: Diagnosis not present

## 2019-08-14 ENCOUNTER — Other Ambulatory Visit: Payer: Self-pay | Admitting: Family Medicine

## 2019-08-14 DIAGNOSIS — Z1231 Encounter for screening mammogram for malignant neoplasm of breast: Secondary | ICD-10-CM

## 2019-09-27 DIAGNOSIS — H3530 Unspecified macular degeneration: Secondary | ICD-10-CM | POA: Diagnosis not present

## 2019-09-27 DIAGNOSIS — Z961 Presence of intraocular lens: Secondary | ICD-10-CM | POA: Diagnosis not present

## 2019-09-27 DIAGNOSIS — H401133 Primary open-angle glaucoma, bilateral, severe stage: Secondary | ICD-10-CM | POA: Diagnosis not present

## 2019-11-24 ENCOUNTER — Ambulatory Visit
Admission: RE | Admit: 2019-11-24 | Discharge: 2019-11-24 | Disposition: A | Discharge status: Home / Self Care | Payer: PPO | Source: Ambulatory Visit | Attending: Family Medicine | Admitting: Family Medicine

## 2019-11-24 DIAGNOSIS — Z1231 Encounter for screening mammogram for malignant neoplasm of breast: Secondary | ICD-10-CM

## 2019-11-30 DIAGNOSIS — Z0000 Encounter for general adult medical examination without abnormal findings: Secondary | ICD-10-CM | POA: Diagnosis not present

## 2019-11-30 DIAGNOSIS — E7800 Pure hypercholesterolemia, unspecified: Secondary | ICD-10-CM | POA: Diagnosis not present

## 2019-11-30 DIAGNOSIS — K219 Gastro-esophageal reflux disease without esophagitis: Secondary | ICD-10-CM | POA: Diagnosis not present

## 2019-11-30 DIAGNOSIS — E039 Hypothyroidism, unspecified: Secondary | ICD-10-CM | POA: Diagnosis not present

## 2019-11-30 DIAGNOSIS — J301 Allergic rhinitis due to pollen: Secondary | ICD-10-CM | POA: Diagnosis not present

## 2019-11-30 DIAGNOSIS — G2581 Restless legs syndrome: Secondary | ICD-10-CM | POA: Diagnosis not present

## 2020-01-31 DIAGNOSIS — H401133 Primary open-angle glaucoma, bilateral, severe stage: Secondary | ICD-10-CM | POA: Diagnosis not present

## 2020-01-31 DIAGNOSIS — Z961 Presence of intraocular lens: Secondary | ICD-10-CM | POA: Diagnosis not present

## 2020-01-31 DIAGNOSIS — H3530 Unspecified macular degeneration: Secondary | ICD-10-CM | POA: Diagnosis not present

## 2020-02-13 DIAGNOSIS — Z96653 Presence of artificial knee joint, bilateral: Secondary | ICD-10-CM | POA: Diagnosis not present

## 2020-02-13 DIAGNOSIS — M170 Bilateral primary osteoarthritis of knee: Secondary | ICD-10-CM | POA: Diagnosis not present

## 2020-05-27 DIAGNOSIS — H401133 Primary open-angle glaucoma, bilateral, severe stage: Secondary | ICD-10-CM | POA: Diagnosis not present

## 2020-05-27 DIAGNOSIS — Z961 Presence of intraocular lens: Secondary | ICD-10-CM | POA: Diagnosis not present

## 2020-05-27 DIAGNOSIS — H3530 Unspecified macular degeneration: Secondary | ICD-10-CM | POA: Diagnosis not present

## 2020-05-30 DIAGNOSIS — E039 Hypothyroidism, unspecified: Secondary | ICD-10-CM | POA: Diagnosis not present

## 2020-05-30 DIAGNOSIS — E7800 Pure hypercholesterolemia, unspecified: Secondary | ICD-10-CM | POA: Diagnosis not present

## 2020-05-30 DIAGNOSIS — K219 Gastro-esophageal reflux disease without esophagitis: Secondary | ICD-10-CM | POA: Diagnosis not present

## 2020-05-30 DIAGNOSIS — J301 Allergic rhinitis due to pollen: Secondary | ICD-10-CM | POA: Diagnosis not present

## 2020-05-30 DIAGNOSIS — G2581 Restless legs syndrome: Secondary | ICD-10-CM | POA: Diagnosis not present

## 2020-06-12 DIAGNOSIS — Z85828 Personal history of other malignant neoplasm of skin: Secondary | ICD-10-CM | POA: Diagnosis not present

## 2020-06-12 DIAGNOSIS — D485 Neoplasm of uncertain behavior of skin: Secondary | ICD-10-CM | POA: Diagnosis not present

## 2020-06-12 DIAGNOSIS — Z08 Encounter for follow-up examination after completed treatment for malignant neoplasm: Secondary | ICD-10-CM | POA: Diagnosis not present

## 2020-06-12 DIAGNOSIS — C44612 Basal cell carcinoma of skin of right upper limb, including shoulder: Secondary | ICD-10-CM | POA: Diagnosis not present

## 2020-06-12 DIAGNOSIS — L304 Erythema intertrigo: Secondary | ICD-10-CM | POA: Diagnosis not present

## 2020-06-20 DIAGNOSIS — C44612 Basal cell carcinoma of skin of right upper limb, including shoulder: Secondary | ICD-10-CM | POA: Diagnosis not present

## 2020-07-30 DIAGNOSIS — H401133 Primary open-angle glaucoma, bilateral, severe stage: Secondary | ICD-10-CM | POA: Diagnosis not present

## 2020-07-30 DIAGNOSIS — Z961 Presence of intraocular lens: Secondary | ICD-10-CM | POA: Diagnosis not present

## 2020-07-30 DIAGNOSIS — H3530 Unspecified macular degeneration: Secondary | ICD-10-CM | POA: Diagnosis not present

## 2020-08-06 DIAGNOSIS — Z23 Encounter for immunization: Secondary | ICD-10-CM | POA: Diagnosis not present

## 2020-11-03 ENCOUNTER — Other Ambulatory Visit: Payer: Self-pay

## 2020-11-03 ENCOUNTER — Encounter
Admission: EM | Disposition: A | Discharge status: Home / Self Care | Payer: Self-pay | Source: Home / Self Care | Attending: Hospitalist

## 2020-11-03 ENCOUNTER — Emergency Department: Payer: PPO

## 2020-11-03 ENCOUNTER — Inpatient Hospital Stay
Admission: EM | Admit: 2020-11-03 | Discharge: 2020-11-05 | DRG: 282 | Disposition: A | Discharge status: Home / Self Care | Payer: PPO | Attending: Hospitalist | Admitting: Hospitalist

## 2020-11-03 ENCOUNTER — Encounter: Payer: Self-pay | Admitting: Intensive Care

## 2020-11-03 ENCOUNTER — Inpatient Hospital Stay
Admit: 2020-11-03 | Discharge: 2020-11-03 | Disposition: A | Discharge status: Home / Self Care | Payer: PPO | Attending: Cardiology | Admitting: Cardiology

## 2020-11-03 DIAGNOSIS — Z880 Allergy status to penicillin: Secondary | ICD-10-CM

## 2020-11-03 DIAGNOSIS — E785 Hyperlipidemia, unspecified: Secondary | ICD-10-CM | POA: Diagnosis not present

## 2020-11-03 DIAGNOSIS — I214 Non-ST elevation (NSTEMI) myocardial infarction: Principal | ICD-10-CM | POA: Diagnosis present

## 2020-11-03 DIAGNOSIS — I081 Rheumatic disorders of both mitral and tricuspid valves: Secondary | ICD-10-CM | POA: Diagnosis not present

## 2020-11-03 DIAGNOSIS — Z881 Allergy status to other antibiotic agents status: Secondary | ICD-10-CM | POA: Diagnosis not present

## 2020-11-03 DIAGNOSIS — E039 Hypothyroidism, unspecified: Secondary | ICD-10-CM | POA: Diagnosis not present

## 2020-11-03 DIAGNOSIS — Z79899 Other long term (current) drug therapy: Secondary | ICD-10-CM | POA: Diagnosis not present

## 2020-11-03 DIAGNOSIS — M1990 Unspecified osteoarthritis, unspecified site: Secondary | ICD-10-CM | POA: Diagnosis not present

## 2020-11-03 DIAGNOSIS — Z7982 Long term (current) use of aspirin: Secondary | ICD-10-CM

## 2020-11-03 DIAGNOSIS — I219 Acute myocardial infarction, unspecified: Secondary | ICD-10-CM | POA: Diagnosis not present

## 2020-11-03 DIAGNOSIS — G2581 Restless legs syndrome: Secondary | ICD-10-CM | POA: Diagnosis not present

## 2020-11-03 DIAGNOSIS — Z888 Allergy status to other drugs, medicaments and biological substances status: Secondary | ICD-10-CM

## 2020-11-03 DIAGNOSIS — I517 Cardiomegaly: Secondary | ICD-10-CM | POA: Diagnosis not present

## 2020-11-03 DIAGNOSIS — K219 Gastro-esophageal reflux disease without esophagitis: Secondary | ICD-10-CM | POA: Diagnosis present

## 2020-11-03 DIAGNOSIS — Z79890 Hormone replacement therapy (postmenopausal): Secondary | ICD-10-CM

## 2020-11-03 DIAGNOSIS — Z91013 Allergy to seafood: Secondary | ICD-10-CM

## 2020-11-03 DIAGNOSIS — Z20822 Contact with and (suspected) exposure to covid-19: Secondary | ICD-10-CM | POA: Diagnosis present

## 2020-11-03 DIAGNOSIS — I10 Essential (primary) hypertension: Secondary | ICD-10-CM | POA: Diagnosis not present

## 2020-11-03 DIAGNOSIS — Z9049 Acquired absence of other specified parts of digestive tract: Secondary | ICD-10-CM | POA: Diagnosis not present

## 2020-11-03 DIAGNOSIS — I499 Cardiac arrhythmia, unspecified: Secondary | ICD-10-CM | POA: Diagnosis not present

## 2020-11-03 DIAGNOSIS — Z96653 Presence of artificial knee joint, bilateral: Secondary | ICD-10-CM | POA: Diagnosis not present

## 2020-11-03 DIAGNOSIS — I213 ST elevation (STEMI) myocardial infarction of unspecified site: Secondary | ICD-10-CM | POA: Diagnosis not present

## 2020-11-03 DIAGNOSIS — Z885 Allergy status to narcotic agent status: Secondary | ICD-10-CM

## 2020-11-03 DIAGNOSIS — R0789 Other chest pain: Secondary | ICD-10-CM | POA: Diagnosis not present

## 2020-11-03 DIAGNOSIS — R0902 Hypoxemia: Secondary | ICD-10-CM | POA: Diagnosis not present

## 2020-11-03 DIAGNOSIS — R079 Chest pain, unspecified: Secondary | ICD-10-CM | POA: Diagnosis not present

## 2020-11-03 LAB — CBC
HCT: 33.5 % — ABNORMAL LOW (ref 36.0–46.0)
Hemoglobin: 11 g/dL — ABNORMAL LOW (ref 12.0–15.0)
MCH: 30.3 pg (ref 26.0–34.0)
MCHC: 32.8 g/dL (ref 30.0–36.0)
MCV: 92.3 fL (ref 80.0–100.0)
Platelets: 162 10*3/uL (ref 150–400)
RBC: 3.63 MIL/uL — ABNORMAL LOW (ref 3.87–5.11)
RDW: 12.9 % (ref 11.5–15.5)
WBC: 8 10*3/uL (ref 4.0–10.5)
nRBC: 0 % (ref 0.0–0.2)

## 2020-11-03 LAB — TSH: TSH: 1.846 u[IU]/mL (ref 0.350–4.500)

## 2020-11-03 LAB — BASIC METABOLIC PANEL
Anion gap: 10 (ref 5–15)
BUN: 16 mg/dL (ref 8–23)
CO2: 25 mmol/L (ref 22–32)
Calcium: 9.6 mg/dL (ref 8.9–10.3)
Chloride: 106 mmol/L (ref 98–111)
Creatinine, Ser: 0.49 mg/dL (ref 0.44–1.00)
GFR, Estimated: >60 mL/min (ref >60)
Glucose, Bld: 103 mg/dL — ABNORMAL HIGH (ref 70–99)
Potassium: 4.1 mmol/L (ref 3.5–5.1)
Sodium: 141 mmol/L (ref 135–145)

## 2020-11-03 LAB — TROPONIN I (HIGH SENSITIVITY)
Troponin I (High Sensitivity): 99 ng/L — ABNORMAL HIGH (ref <18)
Troponin I (High Sensitivity): 2718 ng/L (ref <18)
Troponin I (High Sensitivity): 9473 ng/L (ref <18)

## 2020-11-03 LAB — HEPARIN LEVEL (UNFRACTIONATED): Heparin Unfractionated: 0.34 IU/mL (ref 0.30–0.70)

## 2020-11-03 LAB — PROTIME-INR
INR: 1 (ref 0.8–1.2)
Prothrombin Time: 12.7 seconds (ref 11.4–15.2)

## 2020-11-03 LAB — APTT: aPTT: 30 seconds (ref 24–36)

## 2020-11-03 SURGERY — CORONARY/GRAFT ACUTE MI REVASCULARIZATION
Anesthesia: Moderate Sedation

## 2020-11-03 MED ORDER — SODIUM CHLORIDE 0.9% FLUSH
3.0000 mL | Freq: Two times a day (BID) | INTRAVENOUS | Status: DC
  Administered 2020-11-03 – 2020-11-05 (×2): 3 mL via INTRAVENOUS

## 2020-11-03 MED ORDER — SODIUM CHLORIDE 0.9 % IV SOLN: INTRAVENOUS | Status: DC

## 2020-11-03 MED ORDER — NITROGLYCERIN 0.4 MG SL SUBL: 0.4000 mg | SUBLINGUAL_TABLET | SUBLINGUAL | Status: DC | PRN

## 2020-11-03 MED ORDER — ACETAMINOPHEN 325 MG PO TABS
650.0000 mg | ORAL_TABLET | ORAL | Status: DC | PRN
  Administered 2020-11-03 (×2): 650 mg via ORAL
  Filled 2020-11-03 (×2): qty 2

## 2020-11-03 MED ORDER — ATORVASTATIN CALCIUM 80 MG PO TABS
80.0000 mg | ORAL_TABLET | Freq: Every day | ORAL | Status: DC
  Administered 2020-11-03 – 2020-11-04 (×2): 80 mg via ORAL
  Filled 2020-11-03: qty 4
  Filled 2020-11-03: qty 1

## 2020-11-03 MED ORDER — ONDANSETRON HCL 4 MG/2ML IJ SOLN: 4.0000 mg | Freq: Four times a day (QID) | INTRAMUSCULAR | Status: DC | PRN

## 2020-11-03 MED ORDER — ALPRAZOLAM 0.25 MG PO TABS: 0.2500 mg | ORAL_TABLET | Freq: Two times a day (BID) | ORAL | Status: DC | PRN

## 2020-11-03 MED ORDER — NITROGLYCERIN 2 % TD OINT
1.0000 [in_us] | TOPICAL_OINTMENT | Freq: Three times a day (TID) | TRANSDERMAL | Status: DC
  Administered 2020-11-03 – 2020-11-04 (×3): 1 [in_us] via TOPICAL
  Filled 2020-11-03 (×3): qty 1

## 2020-11-03 MED ORDER — HEPARIN BOLUS VIA INFUSION
4000.0000 [IU] | Freq: Once | INTRAVENOUS | Status: AC
  Administered 2020-11-03: 4000 [IU] via INTRAVENOUS
  Filled 2020-11-03: qty 4000

## 2020-11-03 MED ORDER — HEPARIN (PORCINE) 25000 UT/250ML-% IV SOLN
900.0000 [IU]/h | INTRAVENOUS | Status: DC
  Administered 2020-11-03: 900 [IU]/h via INTRAVENOUS
  Filled 2020-11-03: qty 250

## 2020-11-03 MED ORDER — ASPIRIN 300 MG RE SUPP: 300.0000 mg | RECTAL | Status: AC

## 2020-11-03 MED ORDER — METOPROLOL TARTRATE 25 MG PO TABS
12.5000 mg | ORAL_TABLET | Freq: Two times a day (BID) | ORAL | Status: DC
  Administered 2020-11-03 – 2020-11-05 (×4): 12.5 mg via ORAL
  Filled 2020-11-03 (×4): qty 1

## 2020-11-03 MED ORDER — ACETAMINOPHEN 325 MG PO TABS: 650.0000 mg | ORAL_TABLET | Freq: Every day | ORAL | Status: DC | PRN

## 2020-11-03 MED ORDER — LEVOTHYROXINE SODIUM 100 MCG PO TABS
100.0000 ug | ORAL_TABLET | Freq: Every day | ORAL | Status: DC
  Administered 2020-11-04 – 2020-11-05 (×2): 100 ug via ORAL
  Filled 2020-11-03: qty 1
  Filled 2020-11-03: qty 2
  Filled 2020-11-03: qty 1

## 2020-11-03 MED ORDER — ASPIRIN 81 MG PO CHEW
324.0000 mg | CHEWABLE_TABLET | ORAL | Status: AC
  Filled 2020-11-03: qty 4

## 2020-11-03 NOTE — ED Notes (Signed)
paraschos at bedside

## 2020-11-03 NOTE — Progress Notes (Addendum)
   11/03/20 0948  Clinical Encounter Type  Visited With Patient and family together  Visit Type Initial  Referral From Nurse  Consult/Referral To Chaplain  Chaplain responded to a CODE STEMI pg. Chaplain stood by quietly while staff worked with Pt and while doctors explained what was going. Pt's daughter arrived at 10:07 and chaplain told them of her availability and left. Pt scheduled to go to cath lab tomorrow. Chaplain will try to follow up later.

## 2020-11-03 NOTE — ED Notes (Signed)
[name], MD at bedside. 

## 2020-11-03 NOTE — ED Triage Notes (Addendum)
Pt presents with left chest tightness that radiates to left arm. Ems administered 2 nitro sprays. Pain 2-3 out of 10. Reports "not feeling great" yesterday. A&O x4 in triage. Patient took 324 aspirin before EMS arrived

## 2020-11-03 NOTE — ED Provider Notes (Signed)
[facility] Emergency Department Provider Note  ____________________________________________   Event Date/Time   First MD Initiated Contact with Patient 11/03/20 [phone]     (approximate)  I have reviewed the triage vital signs and the nursing notes.   HISTORY  Chief Complaint Chest Pain    HPI Maria Trujillo is a 87 y.o. female with history of kidney stones here with chest pain.  Patient states that starting this morning, she developed aching, throbbing, substernal chest pressure.  She felt like she had a blood pressure cuff on her left arm with a squeezing sensation as well.  She had associated shortness of breath.  She states that symptoms persisted throughout the morning so she called EMS.  On EMS arrival, the patient was in moderate distress.  She had possible ST elevations on EKG and she was activated as a code STEMI.  Since then, she was given aspirin as well as nitroglycerin with improvement in her chest pain.  States her pain is now a 1 out of 10.  Dr. Sharen Counter shows present on arrival.  Denies any current shortness of breath, nausea, or vomiting.  No diaphoresis.  No known history of coronary disease.        Past Medical History:  Diagnosis Date  . Arthritis   . History of kidney stones   . Hypothyroidism   . PONV (postoperative nausea and vomiting)     Patient Active Problem List   Diagnosis Date Noted  . NSTEMI (non-ST elevated myocardial infarction) ([place]) 11/03/2020  . Esophageal reflux 02/15/2017  . HTN (hypertension) 02/15/2017  . S/P total knee arthroplasty 02/15/2017  . Hyperlipidemia, unspecified 03/06/2014  . Restless leg syndrome 03/06/2014    Past Surgical History:  Procedure Laterality Date  . BREAST EXCISIONAL BIOPSY Bilateral 1984   benign  . CATARACT EXTRACTION, BILATERAL    . CHOLECYSTECTOMY    . JOINT REPLACEMENT Left 12/2012   knee  . KNEE ARTHROPLASTY Right 02/15/2017   Procedure: COMPUTER ASSISTED TOTAL KNEE  ARTHROPLASTY;  Surgeon: Dereck Leep, MD;  Location: [facility] ORS;  Service: Orthopedics;  Laterality: Right;  . PHOTOCOAGULATION WITH LASER Right 04/20/2018   Procedure: TRANSCLERAL DIODE CYCLOPHOTOCOAGULATION  RIGHT per [place] block;  Surgeon: Leandrew Koyanagi, MD;  Location: [place];  Service: Ophthalmology;  Laterality: Right;  . ROTATOR CUFF REPAIR Left 2010  . TONSILLECTOMY      Prior to Admission medications   Medication Sig Start Date End Date Taking? Authorizing Provider  acetaminophen (TYLENOL) 650 MG CR tablet Take 650 mg by mouth daily as needed for pain.   Yes [provider]  aspirin 81 MG tablet Take 81 mg by mouth at bedtime.   Yes [provider]  bimatoprost (LUMIGAN) 0.03 % ophthalmic solution Place 1 drop into both eyes at bedtime.   Yes [provider]  Calcium Carb-Cholecalciferol (CALCIUM CARBONATE-VITAMIN D3) 600-400 MG-UNIT TABS Take 1 tablet by mouth daily.   Yes [provider]  cholecalciferol (VITAMIN D) 1000 units tablet Take 1,000 Units by mouth daily.   Yes [provider]  fexofenadine (ALLEGRA) 180 MG tablet Take 180 mg by mouth daily as needed for allergies or rhinitis.   Yes [provider]  levothyroxine (SYNTHROID, LEVOTHROID) 100 MCG tablet Take 100 mcg by mouth daily before breakfast.   Yes [provider]  Multiple Vitamin (MULTIVITAMIN WITH MINERALS) TABS tablet Take 1 tablet by mouth daily.   Yes [provider]  Multiple Vitamins-Minerals (PRESERVISION AREDS 2  PO) Take 2 tablets by mouth 2 (two) times daily.   Yes [provider]  Omega-3 Fatty Acids (FISH OIL OMEGA-3) 1000 MG CAPS Take 1,000 mg by mouth daily.   Yes [provider]  timolol (TIMOPTIC) 0.5 % ophthalmic solution Place 1 drop into both eyes daily. 10/28/20  Yes [provider]    Allergies Dorzolamide, Celecoxib, Penicillins, Oxycodone, Shellfish allergy, Sulfa antibiotics,  Amoxicillin, Amoxicillin-pot clavulanate, Brimonidine, Cefuroxime axetil, Dipivefrin, and Timolol maleate  Family History  Problem Relation Age of Onset  . Breast cancer Sister 66    Social History Social History   Tobacco Use  . Smoking status: Never Smoker  . Smokeless tobacco: Never Used  Vaping Use  . Vaping Use: Never used  Substance Use Topics  . Alcohol use: No  . Drug use: No    Review of Systems  Review of Systems  Constitutional: Negative for fatigue and fever.  HENT: Negative for congestion and sore throat.   Eyes: Negative for visual disturbance.  Respiratory: Positive for chest tightness. Negative for cough and shortness of breath.   Cardiovascular: Positive for chest pain.  Gastrointestinal: Negative for abdominal pain, diarrhea, nausea and vomiting.  Genitourinary: Negative for flank pain.  Musculoskeletal: Negative for back pain and neck pain.  Skin: Negative for rash and wound.  Neurological: Negative for weakness.  All other systems reviewed and are negative.    ____________________________________________  PHYSICAL EXAM:      VITAL SIGNS: ED Triage Vitals  Enc Vitals Group     BP 11/03/20 1003 (!) 148/77     Pulse Rate 11/03/20 1003 (!) 58     Resp 11/03/20 1003 14     Temp 11/03/20 1003 97.7 F (36.5 C)     Temp Source 11/03/20 1003 Oral     SpO2 11/03/20 1003 97 %     Weight 11/03/20 1001 185 lb (83.9 kg)     Height 11/03/20 1001 5\' 5"  (1.651 m)     Head Circumference --      Peak Flow --      Pain Score 11/03/20 1001 0     Pain Loc --      Pain Edu? --      Excl. in [place]? --      Physical Exam Vitals and nursing note reviewed.  Constitutional:      General: She is not in acute distress.    Appearance: She is well-developed.  HENT:     Head: Normocephalic and atraumatic.  Eyes:     Conjunctiva/sclera: Conjunctivae normal.  Cardiovascular:     Rate and Rhythm: Normal rate and regular rhythm.     Heart sounds: Normal heart  sounds. No murmur heard. No friction rub.  Pulmonary:     Effort: Pulmonary effort is normal. No respiratory distress.     Breath sounds: Normal breath sounds. No wheezing or rales.  Abdominal:     General: There is no distension.     Palpations: Abdomen is soft.     Tenderness: There is no abdominal tenderness.  Musculoskeletal:     Cervical back: Neck supple.  Skin:    General: Skin is warm.     Capillary Refill: Capillary refill takes less than 2 seconds.  Neurological:     Mental Status: She is alert and oriented to person, place, and time.     Motor: No abnormal muscle tone.       ____________________________________________   LABS (all labs ordered are listed, but  only abnormal results are displayed)  Labs Reviewed  BASIC METABOLIC PANEL - Abnormal; Notable for the following components:      Result Value   Glucose, Bld 103 (*)    All other components within normal limits  CBC - Abnormal; Notable for the following components:   RBC 3.63 (*)    Hemoglobin 11.0 (*)    HCT 33.5 (*)    All other components within normal limits  TROPONIN I (HIGH SENSITIVITY) - Abnormal; Notable for the following components:   Troponin I (High Sensitivity) 99 (*)    All other components within normal limits  SARS CORONAVIRUS 2 (TAT 6-24 HRS)  PROTIME-INR  APTT  HEPARIN LEVEL (UNFRACTIONATED)  TSH  TROPONIN I (HIGH SENSITIVITY)  TROPONIN I (HIGH SENSITIVITY)    ____________________________________________  EKG: Normal sinus rhythm, ventricular rate 61.  QRS 92, QTc 422.  ST elevation noted in lead III but no persistent or contiguous changes. ________________________________________  RADIOLOGY All imaging, including plain films, CT scans, and ultrasounds, independently reviewed by me, and interpretations confirmed via formal radiology reads.  ED MD interpretation:   CxR: Clear  Official radiology report(s): DG Chest 2 View  Result Date: 11/03/2020 CLINICAL DATA:  Chest pain  EXAM: CHEST - 2 VIEW COMPARISON:  None. FINDINGS: Borderline cardiomegaly. Coarse/interstitial lung markings bilaterally suggesting chronic interstitial lung disease. No confluent opacity to suggest consolidating pneumonia. No pleural effusion or pneumothorax is seen. Mild degenerative spondylosis of the kyphotic thoracic spine. No acute appearing osseous abnormality. Atherosclerosis noted at the aortic arch. IMPRESSION: 1. No active cardiopulmonary disease. No evidence of pneumonia or pulmonary edema. 2. Probable chronic interstitial lung disease. 3. Borderline cardiomegaly. 4. Aortic atherosclerosis. Electronically Signed   By: Franki Cabot M.D.   On: 11/03/2020 10:37    ____________________________________________  PROCEDURES   Procedure(s) performed (including Critical Care):  .Critical Care Performed by: Duffy Bruce, MD Authorized by: Duffy Bruce, MD   Critical care provider statement:    Critical care time (minutes):  35   Critical care time was exclusive of:  Separately billable procedures and treating other patients and teaching time   Critical care was necessary to treat or prevent imminent or life-threatening deterioration of the following conditions:  Cardiac failure, respiratory failure and circulatory failure   Critical care was time spent personally by me on the following activities:  Development of treatment plan with patient or surrogate, discussions with consultants, evaluation of patient's response to treatment, examination of patient, obtaining history from patient or surrogate, ordering and performing treatments and interventions, ordering and review of laboratory studies, ordering and review of radiographic studies, pulse oximetry, re-evaluation of patient's condition and review of old charts   I assumed direction of critical care for this patient from another provider in my specialty: no   .1-3 Lead EKG Interpretation Performed by: Duffy Bruce, MD Authorized  by: Duffy Bruce, MD     Interpretation: normal     ECG rate:  60-80   ECG rate assessment: normal     Rhythm: sinus rhythm     Ectopy: none     Conduction: normal   Comments:     Indication: NSTEMI    ____________________________________________  INITIAL IMPRESSION / MDM / ASSESSMENT AND PLAN / ED COURSE As part of my medical decision making, I reviewed the following data within the [place] notes reviewed and incorporated, Old chart reviewed, Notes from prior ED visits, and [place] Controlled Substance Database       *  Maria Trujillo was evaluated in Emergency Department on 11/03/2020 for the symptoms described in the history of present illness. She was evaluated in the context of the global COVID-19 pandemic, which necessitated consideration that the patient might be at risk for infection with the SARS-CoV-2 virus that causes COVID-19. Institutional protocols and algorithms that pertain to the evaluation of patients at risk for COVID-19 are in a state of rapid change based on information released by regulatory bodies including the CDC and federal and state organizations. These policies and algorithms were followed during the patient's care in the ED.  Some ED evaluations and interventions may be delayed as a result of limited staffing during the pandemic.*     Medical Decision Making: 87 year old female here with chest pain, shortness of breath.  The patient arrives via EMS as a code STEMI.  On arrival, her pain is improved and EKG, but does show some subtle ST elevation in lead III in particular with some possible reciprocal changes in 1 and aVL, shows no contiguous ST elevations.  Dr. Saralyn Pilar at bedside and would like to hold on STEMI activation at this time.  He recommends trending troponins and admission.  Patient does have an elevated troponin but has had no recurrence of chest pain or shortness of breath while here.  She has been given aspirin.  Will start her  on heparin and admit.  Pain is not concerning for dissection or PE.  She is afebrile without signs of infection.  ____________________________________________  FINAL CLINICAL IMPRESSION(S) / ED DIAGNOSES  Final diagnoses:  NSTEMI (non-ST elevated myocardial infarction) ([place])     MEDICATIONS GIVEN DURING THIS VISIT:  Medications  nitroGLYCERIN (NITROSTAT) SL tablet 0.4 mg (has no administration in time range)  heparin bolus via infusion 4,000 Units (4,000 Units Intravenous Bolus from Bag 11/03/20 1123)    Followed by  heparin ADULT infusion 100 units/mL (25000 units/249mL) (900 Units/hr Intravenous New Bag/Given 11/03/20 1128)  nitroGLYCERIN (NITROGLYN) 2 % ointment 1 inch (1 inch Topical Given 11/03/20 1138)  atorvastatin (LIPITOR) tablet 80 mg (has no administration in time range)  sodium chloride flush (NS) 0.9 % injection 3 mL (3 mLs Intravenous Given 11/03/20 1129)  levothyroxine (SYNTHROID) tablet 100 mcg (has no administration in time range)  aspirin chewable tablet 324 mg (has no administration in time range)    Or  aspirin suppository 300 mg (has no administration in time range)  acetaminophen (TYLENOL) tablet 650 mg (has no administration in time range)  ondansetron (ZOFRAN) injection 4 mg (has no administration in time range)  0.9 %  sodium chloride infusion (has no administration in time range)  metoprolol tartrate (LOPRESSOR) tablet 12.5 mg (has no administration in time range)  ALPRAZolam (XANAX) tablet 0.25 mg (has no administration in time range)     ED Discharge Orders    None       Note:  This document was prepared using Dragon voice recognition software and may include unintentional dictation errors.   Duffy Bruce, MD 11/03/20 [phone]

## 2020-11-03 NOTE — Progress Notes (Signed)
ANTICOAGULATION CONSULT NOTE - Initial Consult  Pharmacy Consult for Heparin Infusion  Indication: chest pain/ACS  Allergies  Allergen Reactions  . Dorzolamide Shortness Of Breath  . Celecoxib Hives  . Penicillins Other (See Comments)    Redness (sunburn type rash with peeling) Has patient had a PCN reaction causing immediate rash, facial/tongue/throat swelling, SOB or lightheadedness with hypotension: No Has patient had a PCN reaction causing severe rash involving mucus membranes or skin necrosis: No Has patient had a PCN reaction that required hospitalization No Has patient had a PCN reaction occurring within the last 10 years: Yes If all of the above answers are "NO", then may proceed with Cephalosporin use.   Marland Kitchen Oxycodone     BP dropped  . Shellfish Allergy Hives and Itching  . Sulfa Antibiotics Other (See Comments)    Redness in eyes  . Amoxicillin Rash  . Amoxicillin-Pot Clavulanate Other (See Comments)    Redness and flushing  . Brimonidine Other (See Comments)    blisters  . Cefuroxime Axetil Other (See Comments)    redness  . Dipivefrin Other (See Comments)    sleepiness  . Timolol Maleate Other (See Comments)    Redness    Patient Measurements: Height: 5\' 5"  (165.1 cm) Weight: 83.9 kg (185 lb) IBW/kg (Calculated) : 57 Heparin Dosing Weight: 75kg   Vital Signs: Temp: 97.7 F (36.5 C) (01/09 1003) Temp Source: Oral (01/09 1003) BP: 148/77 (01/09 1003) Pulse Rate: 58 (01/09 1003)  Labs: Recent Labs    11/03/20 1002  HGB 11.0*  HCT 33.5*  PLT 162  LABPROT 12.7  INR 1.0  CREATININE 0.49  TROPONINIHS 99*    Estimated Creatinine Clearance: 56 mL/min (by C-G formula based on SCr of 0.49 mg/dL).   Medical History: Past Medical History:  Diagnosis Date  . Arthritis   . History of kidney stones   . Hypothyroidism   . PONV (postoperative nausea and vomiting)     Assessment: 87 yo female admitted with chest pain relieved by nitro sprays. Per  Cardiology, nondiagnostic ECG, with borderline ST elevation in inferior leads. Pharmacy consulted for heparin infusion dosing and monitoring.   Troponin HS: 99  Goal of Therapy:  Heparin level 0.3-0.7 units/ml Monitor platelets by anticoagulation protocol: Yes   Plan:  Baseline labs ordered Give 4000 units bolus x 1 Start heparin infusion at 900 units/hr Check anti-Xa level in 8 hours and daily while on heparin Continue to monitor H&H and platelets  Pernell Dupre, PharmD, BCPS Clinical Pharmacist 11/03/2020 10:51 AM

## 2020-11-03 NOTE — H&P (Signed)
History and Physical    Maria Trujillo PFX:[id number] DOB: 11-02-1933 DOA: 11/03/2020  PCP: Sofie Hartigan, MD (Confirm with patient/family/NH records and if not entered, this has to be entered at [facility] point of entry) Patient coming from: home  I have personally briefly reviewed patient's old medical records in [facility]  Chief Complaint: chest pain  HPI: Maria Trujillo is a 87 y.o. female with medical history significant of hypothyroidism, arthritis  was in her usual state of health until this morning when she developed chest tightness.  She described substernal chest tightness rated 7 out of 10.  She called EMS, initial ECG revealed borderline ST ovation in inferior leads.  She was brought to [facility] ED where she received 2 nitro sprays with relief of chest pain.  She currently denies chest pain.  Repeat ECG revealed sinus rhythm with less than 1 mm ST elevation in leads II, III and aVF, not diagnostic for inferior STEMI   ED Course: T 97.7  122/52  HR 563 RR 16  BMI 31. EDP exam unremarkable. Cmet nl, CBC nl. EKG in 1 mm ST elevation inferior leads. Patient seen by Dr. Josefa Half, cardiology, who felt this was not acute STEMI but NSTEMI. He ordered IV heparin with bolus and plans for cardiac cath 11/04/20. He requested the [facility] admit the patient.  Review of Systems: As per HPI otherwise 10 point review of systems negative.    Past Medical History:  Diagnosis Date  . Arthritis   . History of kidney stones   . Hypothyroidism   . PONV (postoperative nausea and vomiting)     Past Surgical History:  Procedure Laterality Date  . BREAST EXCISIONAL BIOPSY Bilateral 1984   benign  . CATARACT EXTRACTION, BILATERAL    . CHOLECYSTECTOMY    . JOINT REPLACEMENT Left 12/2012   knee  . KNEE ARTHROPLASTY Right 02/15/2017   Procedure: COMPUTER ASSISTED TOTAL KNEE ARTHROPLASTY;  Surgeon: Dereck Leep, MD;  Location: [facility] ORS;  Service: Orthopedics;  Laterality: Right;  . PHOTOCOAGULATION WITH  LASER Right 04/20/2018   Procedure: TRANSCLERAL DIODE CYCLOPHOTOCOAGULATION  RIGHT per Hope block;  Surgeon: Leandrew Koyanagi, MD;  Location: [place];  Service: Ophthalmology;  Laterality: Right;  . ROTATOR CUFF REPAIR Left 2010  . TONSILLECTOMY      Soc Hx -  Married 30 years widowed in 2014. She has one daughter, 1 son, no grandchildren. Lives alone with dog and cat. Retired from Dealer work. Daughter is attentive and a Marine scientist.    reports that she has never smoked. She has never used smokeless tobacco. She reports that she does not drink alcohol and does not use drugs.  Allergies  Allergen Reactions  . Dorzolamide Shortness Of Breath  . Celecoxib Hives  . Penicillins Other (See Comments)    Redness (sunburn type rash with peeling) Has patient had a PCN reaction causing immediate rash, facial/tongue/throat swelling, SOB or lightheadedness with hypotension: No Has patient had a PCN reaction causing severe rash involving mucus membranes or skin necrosis: No Has patient had a PCN reaction that required hospitalization No Has patient had a PCN reaction occurring within the last 10 years: Yes If all of the above answers are "NO", then may proceed with Cephalosporin use.   Marland Kitchen Oxycodone     BP dropped  . Shellfish Allergy Hives and Itching  . Sulfa Antibiotics Other (See Comments)    Redness in eyes  . Amoxicillin Rash  . Amoxicillin-Pot Clavulanate Other (  See Comments)    Redness and flushing  . Brimonidine Other (See Comments)    blisters  . Cefuroxime Axetil Other (See Comments)    redness  . Dipivefrin Other (See Comments)    sleepiness  . Timolol Maleate Other (See Comments)    Redness    Family History  Problem Relation Age of Onset  . Breast cancer Sister 43     Prior to Admission medications   Medication Sig Start Date End Date Taking? Authorizing Provider  acetaminophen (TYLENOL) 650 MG CR tablet Take 650 mg by mouth daily as needed for pain.    Yes [provider]  aspirin 81 MG tablet Take 81 mg by mouth at bedtime.   Yes [provider]  bimatoprost (LUMIGAN) 0.03 % ophthalmic solution Place 1 drop into both eyes at bedtime.   Yes [provider]  Calcium Carb-Cholecalciferol (CALCIUM CARBONATE-VITAMIN D3) 600-400 MG-UNIT TABS Take 1 tablet by mouth daily.   Yes [provider]  cholecalciferol (VITAMIN D) 1000 units tablet Take 1,000 Units by mouth daily.   Yes [provider]  fexofenadine (ALLEGRA) 180 MG tablet Take 180 mg by mouth daily as needed for allergies or rhinitis.   Yes [provider]  levothyroxine (SYNTHROID, LEVOTHROID) 100 MCG tablet Take 100 mcg by mouth daily before breakfast.   Yes [provider]  Multiple Vitamin (MULTIVITAMIN WITH MINERALS) TABS tablet Take 1 tablet by mouth daily.   Yes [provider]  Multiple Vitamins-Minerals (PRESERVISION AREDS 2 PO) Take 2 tablets by mouth 2 (two) times daily.   Yes [provider]  Omega-3 Fatty Acids (FISH OIL OMEGA-3) 1000 MG CAPS Take 1,000 mg by mouth daily.   Yes [provider]  timolol (TIMOPTIC) 0.5 % ophthalmic solution Place 1 drop into both eyes daily. 10/28/20  Yes [provider]    Physical Exam: Vitals:   11/03/20 1003 11/03/20 1100 11/03/20 1130 11/03/20 1200  BP: (!) 148/77 (!) 136/59 122/61 (!) 144/68  Pulse: (!) 58 (!) 55 63 61  Resp: 14 17 16 18   Temp: 97.7 F (36.5 C)     TempSrc: Oral     SpO2: 97% 99% 99% 100%  Weight:      Height:         Vitals:   11/03/20 1003 11/03/20 1100 11/03/20 1130 11/03/20 1200  BP: (!) 148/77 (!) 136/59 122/61 (!) 144/68  Pulse: (!) 58 (!) 55 63 61  Resp: 14 17 16 18   Temp: 97.7 F (36.5 C)     TempSrc: Oral     SpO2: 97% 99% 99% 100%  Weight:      Height:       General: WNWD woman in no distres Eyes: PERRL, lids and conjunctivae normal ENMT: Mucous membranes are moist. Posterior pharynx clear of any  exudate or lesions.Normal dentition.  Neck: normal, supple, no masses, no thyromegaly Respiratory: clear to auscultation bilaterally, no wheezing, no crackles. Normal respiratory effort. No accessory muscle use.  Cardiovascular: Regular rate and rhythm, no murmurs / rubs / gallops. No extremity edema. 2+ pedal pulses. No carotid bruits.  Abdomen: no tenderness, no masses palpated. No hepatosplenomegaly. Bowel sounds positive.  Musculoskeletal: no clubbing / cyanosis. Enlarged DIP and MCP joints both hands. No joint deformity lower extremities but has bilateral scarring from TKR. Good ROM, no contractures. Normal muscle tone.  Skin: no rashes, lesions, ulcers. No induration Neurologic: CN 2-12 grossly intact. Sensation intactS trength 5/5 in all 4.  Psychiatric:  Normal judgment and insight. Alert and oriented x 3. Normal mood.     Labs on Admission: I have personally reviewed following labs and imaging studies  CBC: Recent Labs  Lab 11/03/20 1002  WBC 8.0  HGB 11.0*  HCT 33.5*  MCV 92.3  PLT 735   Basic Metabolic Panel: Recent Labs  Lab 11/03/20 1002  NA 141  K 4.1  CL 106  CO2 25  GLUCOSE 103*  BUN 16  CREATININE 0.49  CALCIUM 9.6   GFR: Estimated Creatinine Clearance: 56 mL/min (by C-G formula based on SCr of 0.49 mg/dL). Liver Function Tests: No results for input(s): AST, ALT, ALKPHOS, BILITOT, PROT, ALBUMIN in the last 168 hours. No results for input(s): LIPASE, AMYLASE in the last 168 hours. No results for input(s): AMMONIA in the last 168 hours. Coagulation Profile: Recent Labs  Lab 11/03/20 1002  INR 1.0   Cardiac Enzymes: No results for input(s): CKTOTAL, CKMB, CKMBINDEX, TROPONINI in the last 168 hours. BNP (last 3 results) No results for input(s): PROBNP in the last 8760 hours. HbA1C: No results for input(s): HGBA1C in the last 72 hours. CBG: No results for input(s): GLUCAP in the last 168 hours. Lipid Profile: No results for input(s): CHOL, HDL,  LDLCALC, TRIG, CHOLHDL, LDLDIRECT in the last 72 hours. Thyroid Function Tests: No results for input(s): TSH, T4TOTAL, FREET4, T3FREE, THYROIDAB in the last 72 hours. Anemia Panel: No results for input(s): VITAMINB12, FOLATE, FERRITIN, TIBC, IRON, RETICCTPCT in the last 72 hours. Urine analysis:    Component Value Date/Time   COLORURINE YELLOW (A) 02/02/2017 0954   APPEARANCEUR CLEAR (A) 02/02/2017 0954   APPEARANCEUR Clear 12/12/2012 1557   LABSPEC 1.008 02/02/2017 0954   LABSPEC 1.013 12/12/2012 1557   PHURINE 6.0 02/02/2017 0954   GLUCOSEU NEGATIVE 02/02/2017 0954   GLUCOSEU Negative 12/12/2012 1557   HGBUR NEGATIVE 02/02/2017 0954   BILIRUBINUR NEGATIVE 02/02/2017 0954   BILIRUBINUR Negative 12/12/2012 [place] 02/02/2017 0954   PROTEINUR NEGATIVE 02/02/2017 0954   NITRITE NEGATIVE 02/02/2017 0954   LEUKOCYTESUR NEGATIVE 02/02/2017 0954   LEUKOCYTESUR Trace 12/12/2012 1557    Radiological Exams on Admission: DG Chest 2 View  Result Date: 11/03/2020 CLINICAL DATA:  Chest pain EXAM: CHEST - 2 VIEW COMPARISON:  None. FINDINGS: Borderline cardiomegaly. Coarse/interstitial lung markings bilaterally suggesting chronic interstitial lung disease. No confluent opacity to suggest consolidating pneumonia. No pleural effusion or pneumothorax is seen. Mild degenerative spondylosis of the kyphotic thoracic spine. No acute appearing osseous abnormality. Atherosclerosis noted at the aortic arch. IMPRESSION: 1. No active cardiopulmonary disease. No evidence of pneumonia or pulmonary edema. 2. Probable chronic interstitial lung disease. 3. Borderline cardiomegaly. 4. Aortic atherosclerosis. Electronically Signed   By: Franki Cabot M.D.   On: 11/03/2020 10:37    EKG: Independently reviewed. Sinus rthythm with 1 mm ST depression III, avf.  Assessment/Plan Active Problems:   NSTEMI (non-ST elevated myocardial infarction) (HCC)   HTN (hypertension)   Hyperlipidemia, unspecified    Restless leg syndrome    1. NSTEMI - patient with new onset chest pain w/ minimal ST elevation suggesting inferior injury. Seen by Dr. Josefa Half and on schedule for cath 11/04/20 Plan Admit cardiac progressive unit  Continue IV heparin  Increased ASA to 324 mg  Add BB - lopressor 12.5 mg bid  Statin therapy - crestor 80 mg daily  2. HTN - stable. On no meds as outpatinet Plan Monitor closely with addition of BB  3. Hypothyroidism - Plan TSH  Continue levothyroxine.   DVT prophylaxis: full dose heparin  Code Status: full code  Family Communication: daughter present during exam. Answered all questions.   Disposition Plan: HOme when stable Consults called: cardiology - Dr. Josefa Half  Admission status: inpatient - cardiac progressive.   Adella Hare MD [facility] Pager [phone]  If 7PM-7AM, please contact night-coverage [email or web address] Password [facility]  11/03/2020, 12:32 PM

## 2020-11-03 NOTE — Consult Note (Signed)
[facility] Cardiology  CARDIOLOGY CONSULT NOTE  Patient ID: Maria Trujillo MRN: [id number] DOB/AGE: 01-22-1934 87 y.o.  Admit date: 11/03/2020 Referring Physician Ellender Hose Primary Physician Adeline Primary Cardiologist  Reason for Consultation chest pain  HPI: 87 year old female referred for evaluation of chest pain.  Patient was in her usual state of health until this morning when she developed chest tightness.  She described substernal chest tightness rated 7 out of 10.  She called EMS, initial ECG revealed borderline ST ovation in inferior leads.  She was brought to [facility] ED where she received 2 nitro sprays with relief of chest pain.  She currently denies chest pain.  Repeat ECG revealed sinus rhythm with less than 1 mm ST elevation in leads II, III and aVF, not diagnostic for inferior STEMI.  Review of systems complete and found to be negative unless listed above     Past Medical History:  Diagnosis Date  . Arthritis   . History of kidney stones   . Hypothyroidism   . PONV (postoperative nausea and vomiting)     Past Surgical History:  Procedure Laterality Date  . BREAST EXCISIONAL BIOPSY Bilateral 1984   benign  . CATARACT EXTRACTION, BILATERAL    . CHOLECYSTECTOMY    . JOINT REPLACEMENT Left 12/2012   knee  . KNEE ARTHROPLASTY Right 02/15/2017   Procedure: COMPUTER ASSISTED TOTAL KNEE ARTHROPLASTY;  Surgeon: Dereck Leep, MD;  Location: [facility] ORS;  Service: Orthopedics;  Laterality: Right;  . PHOTOCOAGULATION WITH LASER Right 04/20/2018   Procedure: TRANSCLERAL DIODE CYCLOPHOTOCOAGULATION  RIGHT per Hope block;  Surgeon: Leandrew Koyanagi, MD;  Location: [place];  Service: Ophthalmology;  Laterality: Right;  . ROTATOR CUFF REPAIR Left 2010  . TONSILLECTOMY      (Not in a hospital admission)  Social History   Socioeconomic History  . Marital status: Widowed    Spouse name: Not on file  . Number of children: Not on file  . Years of education: Not on file  .  Highest education level: Not on file  Occupational History  . Not on file  Tobacco Use  . Smoking status: Never Smoker  . Smokeless tobacco: Never Used  Vaping Use  . Vaping Use: Never used  Substance and Sexual Activity  . Alcohol use: No  . Drug use: No  . Sexual activity: Not on file  Other Topics Concern  . Not on file  Social History Narrative  . Not on file   Social Determinants of Health   Financial Resource Strain: Not on file  Food Insecurity: Not on file  Transportation Needs: Not on file  Physical Activity: Not on file  Stress: Not on file  Social Connections: Not on file  Intimate Partner Violence: Not on file    Family History  Problem Relation Age of Onset  . Breast cancer Sister 63      Review of systems complete and found to be negative unless listed above      PHYSICAL EXAM  General: Well developed, well nourished, in no acute distress HEENT:  Normocephalic and atramatic Neck:  No JVD.  Lungs: Clear bilaterally to auscultation and percussion. Heart: HRRR . Normal S1 and S2 without gallops or murmurs.  Abdomen: Bowel sounds are positive, abdomen soft and non-tender  Msk:  Back normal, normal gait. Normal strength and tone for age. Extremities: No clubbing, cyanosis or edema.   Neuro: Alert and oriented X 3. Psych:  Good affect, responds appropriately  Labs:   Lab  Results  Component Value Date   WBC 8.0 11/03/2020   HGB 11.0 (L) 11/03/2020   HCT 33.5 (L) 11/03/2020   MCV 92.3 11/03/2020   PLT 162 11/03/2020    Recent Labs  Lab 11/03/20 1002  NA 141  K 4.1  CL 106  CO2 25  BUN 16  CREATININE 0.49  CALCIUM 9.6  GLUCOSE 103*   No results found for: CKTOTAL, CKMB, CKMBINDEX, TROPONINI No results found for: CHOL No results found for: HDL No results found for: LDLCALC No results found for: TRIG No results found for: CHOLHDL No results found for: LDLDIRECT    Radiology: DG Chest 2 View  Result Date: 11/03/2020 CLINICAL DATA:   Chest pain EXAM: CHEST - 2 VIEW COMPARISON:  None. FINDINGS: Borderline cardiomegaly. Coarse/interstitial lung markings bilaterally suggesting chronic interstitial lung disease. No confluent opacity to suggest consolidating pneumonia. No pleural effusion or pneumothorax is seen. Mild degenerative spondylosis of the kyphotic thoracic spine. No acute appearing osseous abnormality. Atherosclerosis noted at the aortic arch. IMPRESSION: 1. No active cardiopulmonary disease. No evidence of pneumonia or pulmonary edema. 2. Probable chronic interstitial lung disease. 3. Borderline cardiomegaly. 4. Aortic atherosclerosis. Electronically Signed   By: Franki Cabot M.D.   On: 11/03/2020 10:37    EKG: Sinus rhythm with nondiagnostic inferior ST abnormalities  ASSESSMENT AND PLAN:   1.  New onset chest pain, relieved after 2 nitro sprays, with nondiagnostic ECG, with borderline ST elevation in inferior leads 2.  Essential hypertension  Recommendations  1.  Cancel code STEMI 2.  Heparin drip if initial troponin elevated 3.  Start topical nitrates 4.  2D echocardiogram 5.  Start high intensity atorvastatin 6.  Cardiac catheterization in a.m.  The risk, benefits and alternatives of cardiac catheterization and possible PCI were explained to the patient and informed written consent was obtained.  Signed: Isaias Cowman MD,PhD, [facility] 11/03/2020, 10:57 AM

## 2020-11-03 NOTE — ED Notes (Signed)
Patient resting comfortably at this time.Will continue to monitor.

## 2020-11-03 NOTE — ED Notes (Signed)
Echo at bedside

## 2020-11-03 NOTE — Progress Notes (Signed)
ANTICOAGULATION CONSULT NOTE - Initial Consult  Pharmacy Consult for Heparin Infusion  Indication: chest pain/ACS  Allergies  Allergen Reactions  . Dorzolamide Shortness Of Breath  . Celecoxib Hives  . Penicillins Other (See Comments)    Redness (sunburn type rash with peeling) Has patient had a PCN reaction causing immediate rash, facial/tongue/throat swelling, SOB or lightheadedness with hypotension: No Has patient had a PCN reaction causing severe rash involving mucus membranes or skin necrosis: No Has patient had a PCN reaction that required hospitalization No Has patient had a PCN reaction occurring within the last 10 years: Yes If all of the above answers are "NO", then may proceed with Cephalosporin use.   Marland Kitchen Oxycodone     BP dropped  . Shellfish Allergy Hives and Itching  . Sulfa Antibiotics Other (See Comments)    Redness in eyes  . Amoxicillin Rash  . Amoxicillin-Pot Clavulanate Other (See Comments)    Redness and flushing  . Brimonidine Other (See Comments)    blisters  . Cefuroxime Axetil Other (See Comments)    redness  . Dipivefrin Other (See Comments)    sleepiness  . Timolol Maleate Other (See Comments)    Redness    Patient Measurements: Height: 5\' 5"  (165.1 cm) Weight: 83.9 kg (185 lb) IBW/kg (Calculated) : 57 Heparin Dosing Weight: 75kg   Vital Signs: BP: 130/60 (01/09 2200) Pulse Rate: 59 (01/09 2200)  Labs: Recent Labs    11/03/20 1002 11/03/20 1327 11/03/20 1428 11/03/20 1930  HGB 11.0*  --   --   --   HCT 33.5*  --   --   --   PLT 162  --   --   --   APTT 30  --   --   --   LABPROT 12.7  --   --   --   INR 1.0  --   --   --   HEPARINUNFRC  --   --   --  0.34  CREATININE 0.49  --   --   --   TROPONINIHS 99* 2,718* 9,473*  --     Estimated Creatinine Clearance: 56 mL/min (by C-G formula based on SCr of 0.49 mg/dL).   Medical History: Past Medical History:  Diagnosis Date  . Arthritis   . History of kidney stones   .  Hypothyroidism   . PONV (postoperative nausea and vomiting)     Assessment: 87 yo female admitted with chest pain relieved by nitro sprays. Per Cardiology, nondiagnostic ECG, with borderline ST elevation in inferior leads. Pharmacy consulted for heparin infusion dosing and monitoring.   Troponin HS: 99 INITIAL:  4000 units bolus x 1, Start heparin infusion at 900 units/hr 01/09 1930 HL 0.34, therapeutic x1  Goal of Therapy:  Heparin level 0.3-0.7 units/ml Monitor platelets by anticoagulation protocol: Yes   Plan:  Heparin therapeutic x1. Continue heparin at current rate Re-check anti-Xa level in 8 hours  and daily while on heparin Continue to monitor H&H and platelets  Dorothe Pea, PharmD, BCPS Clinical Pharmacist 11/03/2020 10:42 PM

## 2020-11-03 NOTE — ED Notes (Signed)
Date and time results received: 11/03/20 1418 (use smartphrase ".now" to insert current time)  Test: Troponin Critical Value: 2718  Name of Provider Notified: Norins, MD  Orders Received? Or Actions Taken?: Orders Received - See Orders for details

## 2020-11-03 NOTE — Progress Notes (Signed)
*  PRELIMINARY RESULTS* Echocardiogram 2D Echocardiogram has been performed.  Maria Trujillo 11/03/2020, 4:40 PM

## 2020-11-04 ENCOUNTER — Encounter: Payer: Self-pay | Admitting: Internal Medicine

## 2020-11-04 ENCOUNTER — Encounter
Admission: EM | Disposition: A | Discharge status: Home / Self Care | Payer: Self-pay | Source: Home / Self Care | Attending: Hospitalist

## 2020-11-04 HISTORY — PX: LEFT HEART CATH AND CORONARY ANGIOGRAPHY: CATH118249

## 2020-11-04 LAB — CBC
HCT: 33 % — ABNORMAL LOW (ref 36.0–46.0)
Hemoglobin: 11.1 g/dL — ABNORMAL LOW (ref 12.0–15.0)
MCH: 30.2 pg (ref 26.0–34.0)
MCHC: 33.6 g/dL (ref 30.0–36.0)
MCV: 89.7 fL (ref 80.0–100.0)
Platelets: 147 10*3/uL — ABNORMAL LOW (ref 150–400)
RBC: 3.68 MIL/uL — ABNORMAL LOW (ref 3.87–5.11)
RDW: 13 % (ref 11.5–15.5)
WBC: 6.6 10*3/uL (ref 4.0–10.5)
nRBC: 0 % (ref 0.0–0.2)

## 2020-11-04 LAB — BASIC METABOLIC PANEL
Anion gap: 12 (ref 5–15)
BUN: 15 mg/dL (ref 8–23)
CO2: 24 mmol/L (ref 22–32)
Calcium: 9.1 mg/dL (ref 8.9–10.3)
Chloride: 105 mmol/L (ref 98–111)
Creatinine, Ser: 0.5 mg/dL (ref 0.44–1.00)
GFR, Estimated: >60 mL/min (ref >60)
Glucose, Bld: 93 mg/dL (ref 70–99)
Potassium: 3.7 mmol/L (ref 3.5–5.1)
Sodium: 141 mmol/L (ref 135–145)

## 2020-11-04 LAB — ECHOCARDIOGRAM COMPLETE
AR max vel: 1.7 cm2
AV Peak grad: 10.8 mmHg
Ao pk vel: 1.64 m/s
Area-P 1/2: 2.3 cm2
Height: 65 in
S' Lateral: 3.8 cm
Weight: 2960 oz

## 2020-11-04 LAB — LIPID PANEL
Cholesterol: 150 mg/dL (ref 0–200)
HDL: 60 mg/dL (ref >40)
LDL Cholesterol: 81 mg/dL (ref 0–99)
Total CHOL/HDL Ratio: 2.5 RATIO
Triglycerides: 45 mg/dL (ref <150)
VLDL: 9 mg/dL (ref 0–40)

## 2020-11-04 LAB — HEPARIN LEVEL (UNFRACTIONATED): Heparin Unfractionated: 0.31 IU/mL (ref 0.30–0.70)

## 2020-11-04 LAB — SARS CORONAVIRUS 2 (TAT 6-24 HRS): SARS Coronavirus 2: NEGATIVE

## 2020-11-04 SURGERY — LEFT HEART CATH AND CORONARY ANGIOGRAPHY
Anesthesia: Moderate Sedation

## 2020-11-04 MED ORDER — ASPIRIN 81 MG PO CHEW
CHEWABLE_TABLET | ORAL | Status: AC
  Filled 2020-11-04: qty 1

## 2020-11-04 MED ORDER — HEPARIN (PORCINE) IN NACL 1000-0.9 UT/500ML-% IV SOLN
INTRAVENOUS | Status: DC | PRN
  Administered 2020-11-04: 500 mL

## 2020-11-04 MED ORDER — SODIUM CHLORIDE 0.9 % IV SOLN: 250.0000 mL | INTRAVENOUS | Status: DC | PRN

## 2020-11-04 MED ORDER — CLOPIDOGREL BISULFATE 75 MG PO TABS
75.0000 mg | ORAL_TABLET | Freq: Every day | ORAL | Status: DC
  Administered 2020-11-05: 75 mg via ORAL
  Filled 2020-11-04: qty 1

## 2020-11-04 MED ORDER — SODIUM CHLORIDE 0.9% FLUSH: 3.0000 mL | INTRAVENOUS | Status: DC | PRN

## 2020-11-04 MED ORDER — IOHEXOL 300 MG/ML  SOLN
INTRAMUSCULAR | Status: DC | PRN
  Administered 2020-11-04: 80 mL

## 2020-11-04 MED ORDER — FENTANYL CITRATE (PF) 100 MCG/2ML IJ SOLN
INTRAMUSCULAR | Status: AC
  Filled 2020-11-04: qty 2

## 2020-11-04 MED ORDER — ACETAMINOPHEN 325 MG PO TABS: 650.0000 mg | ORAL_TABLET | ORAL | Status: DC | PRN

## 2020-11-04 MED ORDER — MIDAZOLAM HCL 2 MG/2ML IJ SOLN
INTRAMUSCULAR | Status: AC
  Filled 2020-11-04: qty 2

## 2020-11-04 MED ORDER — LIDOCAINE HCL (PF) 1 % IJ SOLN
INTRAMUSCULAR | Status: AC
  Filled 2020-11-04: qty 30

## 2020-11-04 MED ORDER — HEPARIN (PORCINE) IN NACL 1000-0.9 UT/500ML-% IV SOLN
INTRAVENOUS | Status: AC
  Filled 2020-11-04: qty 1000

## 2020-11-04 MED ORDER — ISOSORBIDE MONONITRATE ER 30 MG PO TB24
30.0000 mg | ORAL_TABLET | Freq: Every day | ORAL | Status: DC
  Administered 2020-11-04 – 2020-11-05 (×2): 30 mg via ORAL
  Filled 2020-11-04 (×3): qty 1

## 2020-11-04 MED ORDER — ASPIRIN 81 MG PO CHEW
81.0000 mg | CHEWABLE_TABLET | ORAL | Status: AC
  Administered 2020-11-04: 81 mg via ORAL

## 2020-11-04 MED ORDER — ONDANSETRON HCL 4 MG/2ML IJ SOLN: 4.0000 mg | Freq: Four times a day (QID) | INTRAMUSCULAR | Status: DC | PRN

## 2020-11-04 MED ORDER — SODIUM CHLORIDE 0.9 % WEIGHT BASED INFUSION: 3.0000 mL/kg/h | INTRAVENOUS | Status: AC

## 2020-11-04 MED ORDER — SODIUM CHLORIDE 0.9 % WEIGHT BASED INFUSION
1.0000 mL/kg/h | INTRAVENOUS | Status: AC
  Administered 2020-11-04: 1 mL/kg/h via INTRAVENOUS

## 2020-11-04 MED ORDER — FENTANYL CITRATE (PF) 100 MCG/2ML IJ SOLN
INTRAMUSCULAR | Status: DC | PRN
  Administered 2020-11-04: 25 ug via INTRAVENOUS

## 2020-11-04 MED ORDER — MIDAZOLAM HCL 2 MG/2ML IJ SOLN
INTRAMUSCULAR | Status: DC | PRN
  Administered 2020-11-04: 0.5 mg via INTRAVENOUS

## 2020-11-04 MED ORDER — SODIUM CHLORIDE 0.9 % WEIGHT BASED INFUSION
1.0000 mL/kg/h | INTRAVENOUS | Status: DC
  Administered 2020-11-04: 1 mL/kg/h via INTRAVENOUS

## 2020-11-04 MED ORDER — LIDOCAINE HCL (PF) 1 % IJ SOLN
INTRAMUSCULAR | Status: DC | PRN
  Administered 2020-11-04: 20 mL

## 2020-11-04 MED ORDER — SODIUM CHLORIDE 0.9% FLUSH
3.0000 mL | Freq: Two times a day (BID) | INTRAVENOUS | Status: DC
  Administered 2020-11-05: 3 mL via INTRAVENOUS

## 2020-11-04 MED ORDER — HYDRALAZINE HCL 20 MG/ML IJ SOLN: 10.0000 mg | INTRAMUSCULAR | Status: AC | PRN

## 2020-11-04 SURGICAL SUPPLY — 9 items
CATH INFINITI 5FR ANG PIGTAIL (CATHETERS) ×2 IMPLANT
CATH INFINITI 5FR JL4 (CATHETERS) ×2 IMPLANT
CATH INFINITI JR4 5F (CATHETERS) ×2 IMPLANT
DEVICE CLOSURE MYNXGRIP 5F (Vascular Products) ×2 IMPLANT
KIT MANI 3VAL PERCEP (MISCELLANEOUS) ×2 IMPLANT
NEEDLE PERC 18GX7CM (NEEDLE) ×2 IMPLANT
PACK CARDIAC CATH (CUSTOM PROCEDURE TRAY) ×2 IMPLANT
SHEATH AVANTI 5FR X 11CM (SHEATH) ×2 IMPLANT
WIRE GUIDERIGHT .035X150 (WIRE) ×2 IMPLANT

## 2020-11-04 NOTE — Progress Notes (Signed)
Patient ambulated to the bathroom with just standby assistance.  No complaints of chest pain or shortness of breath with ambulation.

## 2020-11-04 NOTE — ED Notes (Signed)
Patient resting quietly at this time.

## 2020-11-04 NOTE — ED Notes (Signed)
Per RN Dawn pt to go to cath. Lea RN on way to get pt.

## 2020-11-04 NOTE — Progress Notes (Signed)
ANTICOAGULATION CONSULT NOTE - Initial Consult  Pharmacy Consult for Heparin Infusion  Indication: chest pain/ACS  Allergies  Allergen Reactions  . Dorzolamide Shortness Of Breath  . Celecoxib Hives  . Penicillins Other (See Comments)    Redness (sunburn type rash with peeling) Has patient had a PCN reaction causing immediate rash, facial/tongue/throat swelling, SOB or lightheadedness with hypotension: No Has patient had a PCN reaction causing severe rash involving mucus membranes or skin necrosis: No Has patient had a PCN reaction that required hospitalization No Has patient had a PCN reaction occurring within the last 10 years: Yes If all of the above answers are "NO", then may proceed with Cephalosporin use.   Marland Kitchen Oxycodone     BP dropped  . Shellfish Allergy Hives and Itching  . Sulfa Antibiotics Other (See Comments)    Redness in eyes  . Amoxicillin Rash  . Amoxicillin-Pot Clavulanate Other (See Comments)    Redness and flushing  . Brimonidine Other (See Comments)    blisters  . Cefuroxime Axetil Other (See Comments)    redness  . Dipivefrin Other (See Comments)    sleepiness  . Timolol Maleate Other (See Comments)    Redness    Patient Measurements: Height: 5\' 5"  (165.1 cm) Weight: 83.9 kg (185 lb) IBW/kg (Calculated) : 57 Heparin Dosing Weight: 75kg   Vital Signs: BP: 134/64 (01/10 0500) Pulse Rate: 63 (01/10 0500)  Labs: Recent Labs    11/03/20 1002 11/03/20 1327 11/03/20 1428 11/03/20 1930 11/04/20 0315  HGB 11.0*  --   --   --  11.1*  HCT 33.5*  --   --   --  33.0*  PLT 162  --   --   --  147*  APTT 30  --   --   --   --   LABPROT 12.7  --   --   --   --   INR 1.0  --   --   --   --   HEPARINUNFRC  --   --   --  0.34 0.31  CREATININE 0.49  --   --   --  0.50  TROPONINIHS 99* 2,718* 9,473*  --   --     Estimated Creatinine Clearance: 56 mL/min (by C-G formula based on SCr of 0.5 mg/dL).   Medical History: Past Medical History:  Diagnosis  Date  . Arthritis   . History of kidney stones   . Hypothyroidism   . PONV (postoperative nausea and vomiting)     Assessment: 87 yo female admitted with chest pain relieved by nitro sprays. Per Cardiology, nondiagnostic ECG, with borderline ST elevation in inferior leads. Pharmacy consulted for heparin infusion dosing and monitoring.   Troponin HS: 99 INITIAL:  4000 units bolus x 1, Start heparin infusion at 900 units/hr 01/09 1930 HL 0.34, therapeutic x1 01/10 0315 HL 0.31, therapeutic x2  Goal of Therapy:  Heparin level 0.3-0.7 units/ml Monitor platelets by anticoagulation protocol: Yes   Plan:  Heparin therapeutic x2. Continue heparin at current rate Re-check anti-Xa level daily while on heparin Continue to monitor H&H and platelets  Lonell Face, PharmD, BCPS Clinical Pharmacist 11/04/2020 5:29 AM

## 2020-11-04 NOTE — Progress Notes (Signed)
Patient tolerated meal tray without difficulty. Daughter to go home and return later this afternoon. Patient is without pain at this time and denies needs at this time. Will continue to monitor.

## 2020-11-04 NOTE — Progress Notes (Signed)
PROGRESS NOTE    Maria Trujillo  YCX:[id number] DOB: 1934-10-09 DOA: 11/03/2020 PCP: Sofie Hartigan, MD    Assessment & Plan:   Active Problems:   HTN (hypertension)   Hyperlipidemia, unspecified   Restless leg syndrome   NSTEMI (non-ST elevated myocardial infarction) ([facility])   Maria Trujillo is a 87 y.o. female with medical history significant of hypothyroidism, arthritis  was in her usual state of health until this morning when she developed chest tightness.    1. NSTEMI  - patient with new onset chest pain. Trop up to 9000.  Seen by Dr. Josefa Half.  Started on IV heparin. Plan    --Left heart cath today, found Occluded small caliber RPL 3, no stent. --cont metop (new) and statin (new) --cont ASA 81 --add plavix 75 mg daily --add Imdur  2. HTN - stable.  On no meds as outpatinet Plan      --cont metop and Imdur   3. Hypothyroidism - Plan    --Continue levothyroxine.    DVT prophylaxis: [id number] Code Status: Full code  Family Communication:  Status is: inpatient Dispo:   The patient is from: home Anticipated d/c is to: home Anticipated d/c date is: tomorrow Patient currently is medically ready to d/c.   Subjective and Interval History:  Pt went for heart cath today, not stent placed.  After the procedure, pt denied any chest pain or dyspnea.  Ate.     Objective: Vitals:   11/04/20 1547 11/04/20 2027 11/05/20 0337 11/05/20 0735  BP: (!) 125/59 (!) 109/57 (!) 115/52 133/61  Pulse: 69 64 61 64  Resp:  17 17 18   Temp: 98 F (36.7 C) 97.9 F (36.6 C) 97.8 F (36.6 C) 98 F (36.7 C)  TempSrc: Oral Oral Oral Oral  SpO2: 98% 95% 96% 94%  Weight:   84 kg   Height:       No intake or output data in the 24 hours ending 11/09/20 0032 Filed Weights   11/04/20 0752 11/04/20 1452 11/05/20 0337  Weight: 83.9 kg 84.1 kg 84 kg    Examination:   Constitutional: NAD, AAOx3 HEENT: conjunctivae and lids normal, EOMI CV: RRR.  No cyanosis.   RESP: normal  respiratory effort, on RA Extremities: No effusions, edema in BLE SKIN: warm, dry Neuro: II - XII grossly intact.   Psych: Normal mood and affect.  Appropriate judgement and reason   Data Reviewed: I have personally reviewed following labs and imaging studies  CBC: Recent Labs  Lab 11/03/20 1002 11/04/20 0315 11/05/20 0445  WBC 8.0 6.6 6.2  HGB 11.0* 11.1* 10.3*  HCT 33.5* 33.0* 31.0*  MCV 92.3 89.7 90.4  PLT 162 147* 588*   Basic Metabolic Panel: Recent Labs  Lab 11/03/20 1002 11/04/20 0315 11/05/20 0445  NA 141 141 142  K 4.1 3.7 3.4*  CL 106 105 107  CO2 25 24 24   GLUCOSE 103* 93 95  BUN 16 15 13   CREATININE 0.49 0.50 0.45  CALCIUM 9.6 9.1 8.7*  MG  --   --  1.6*   GFR: Estimated Creatinine Clearance: 56 mL/min (by C-G formula based on SCr of 0.45 mg/dL). Liver Function Tests: No results for input(s): AST, ALT, ALKPHOS, BILITOT, PROT, ALBUMIN in the last 168 hours. No results for input(s): LIPASE, AMYLASE in the last 168 hours. No results for input(s): AMMONIA in the last 168 hours. Coagulation Profile: Recent Labs  Lab 11/03/20 1002  INR 1.0   Cardiac Enzymes: No results  for input(s): CKTOTAL, CKMB, CKMBINDEX, TROPONINI in the last 168 hours. BNP (last 3 results) No results for input(s): PROBNP in the last 8760 hours. HbA1C: No results for input(s): HGBA1C in the last 72 hours. CBG: No results for input(s): GLUCAP in the last 168 hours. Lipid Profile: No results for input(s): CHOL, HDL, LDLCALC, TRIG, CHOLHDL, LDLDIRECT in the last 72 hours. Thyroid Function Tests: No results for input(s): TSH, T4TOTAL, FREET4, T3FREE, THYROIDAB in the last 72 hours. Anemia Panel: No results for input(s): VITAMINB12, FOLATE, FERRITIN, TIBC, IRON, RETICCTPCT in the last 72 hours. Sepsis Labs: No results for input(s): PROCALCITON, LATICACIDVEN in the last 168 hours.  Recent Results (from the past 240 hour(s))  SARS CORONAVIRUS 2 (TAT 6-24 HRS) Nasopharyngeal  Nasopharyngeal Swab     Status: None   Collection Time: 11/03/20 10:05 AM   Specimen: Nasopharyngeal Swab  Result Value Ref Range Status   SARS Coronavirus 2 NEGATIVE NEGATIVE Final    Comment: (NOTE) SARS-CoV-2 target nucleic acids are NOT DETECTED.  The SARS-CoV-2 RNA is generally detectable in upper and lower respiratory specimens during the acute phase of infection. Negative results do not preclude SARS-CoV-2 infection, do not rule out co-infections with other pathogens, and should not be used as the sole basis for treatment or other patient management decisions. Negative results must be combined with clinical observations, patient history, and epidemiological information. The expected result is Negative.  Fact Sheet for Patients: SugarRoll.be  Fact Sheet for Healthcare Providers: https://www.woods-mathews.com/  This test is not yet approved or cleared by the [place] FDA and  has been authorized for detection and/or diagnosis of SARS-CoV-2 by FDA under an Emergency Use Authorization (EUA). This EUA will remain  in effect (meaning this test can be used) for the duration of the COVID-19 declaration under Se ction 564(b)(1) of the Act, 21 U.S.C. section 360bbb-3(b)(1), unless the authorization is terminated or revoked sooner.  Performed at [facility], [place] [address] [place], [place] [address]       Radiology Studies: No results found.   Scheduled Meds: Continuous Infusions:   LOS: 2 days     Enzo Bi, MD [facility] If 7PM-7AM, please contact night-coverage 11/09/2020, 12:32 AM

## 2020-11-04 NOTE — ED Notes (Signed)
Pt taken to cath lab at this time by cath RN

## 2020-11-05 LAB — CBC
HCT: 31 % — ABNORMAL LOW (ref 36.0–46.0)
Hemoglobin: 10.3 g/dL — ABNORMAL LOW (ref 12.0–15.0)
MCH: 30 pg (ref 26.0–34.0)
MCHC: 33.2 g/dL (ref 30.0–36.0)
MCV: 90.4 fL (ref 80.0–100.0)
Platelets: 135 10*3/uL — ABNORMAL LOW (ref 150–400)
RBC: 3.43 MIL/uL — ABNORMAL LOW (ref 3.87–5.11)
RDW: 13.1 % (ref 11.5–15.5)
WBC: 6.2 10*3/uL (ref 4.0–10.5)
nRBC: 0 % (ref 0.0–0.2)

## 2020-11-05 LAB — BASIC METABOLIC PANEL
Anion gap: 11 (ref 5–15)
BUN: 13 mg/dL (ref 8–23)
CO2: 24 mmol/L (ref 22–32)
Calcium: 8.7 mg/dL — ABNORMAL LOW (ref 8.9–10.3)
Chloride: 107 mmol/L (ref 98–111)
Creatinine, Ser: 0.45 mg/dL (ref 0.44–1.00)
GFR, Estimated: >60 mL/min (ref >60)
Glucose, Bld: 95 mg/dL (ref 70–99)
Potassium: 3.4 mmol/L — ABNORMAL LOW (ref 3.5–5.1)
Sodium: 142 mmol/L (ref 135–145)

## 2020-11-05 LAB — MAGNESIUM: Magnesium: 1.6 mg/dL — ABNORMAL LOW (ref 1.7–2.4)

## 2020-11-05 MED ORDER — ATORVASTATIN CALCIUM 80 MG PO TABS: 80.0000 mg | ORAL_TABLET | Freq: Every day | ORAL | 2 refills | Status: DC

## 2020-11-05 MED ORDER — CLOPIDOGREL BISULFATE 75 MG PO TABS
75.0000 mg | ORAL_TABLET | Freq: Every day | ORAL | 2 refills | Status: AC
Start: 2020-11-06 — End: 2021-02-04

## 2020-11-05 MED ORDER — ISOSORBIDE MONONITRATE ER 30 MG PO TB24: 30.0000 mg | ORAL_TABLET | Freq: Every day | ORAL | 2 refills | Status: DC

## 2020-11-05 MED ORDER — LISINOPRIL 5 MG PO TABS: 5.0000 mg | ORAL_TABLET | Freq: Every day | ORAL | 2 refills | Status: DC

## 2020-11-05 MED ORDER — MAGNESIUM SULFATE 2 GM/50ML IV SOLN
2.0000 g | Freq: Once | INTRAVENOUS | Status: AC
  Administered 2020-11-05: 2 g via INTRAVENOUS
  Filled 2020-11-05: qty 50

## 2020-11-05 MED ORDER — METOPROLOL TARTRATE 25 MG PO TABS: 12.5000 mg | ORAL_TABLET | Freq: Two times a day (BID) | ORAL | 2 refills | Status: DC

## 2020-11-05 MED ORDER — POTASSIUM CHLORIDE CRYS ER 20 MEQ PO TBCR
40.0000 meq | EXTENDED_RELEASE_TABLET | Freq: Once | ORAL | Status: AC
  Administered 2020-11-05: 40 meq via ORAL
  Filled 2020-11-05: qty 2

## 2020-11-05 MED ORDER — LISINOPRIL 5 MG PO TABS
5.0000 mg | ORAL_TABLET | Freq: Every day | ORAL | Status: DC
  Administered 2020-11-05: 5 mg via ORAL
  Filled 2020-11-05: qty 1

## 2020-11-05 NOTE — Plan of Care (Signed)
All questions answered, discharge paper work reviewed with patient and daughter, pt ready for discharge home.

## 2020-11-05 NOTE — Discharge Summary (Signed)
Physician Discharge Summary   Maria Trujillo  female DOB: 1934-05-28  WEX:[id number]  PCP: Sofie Hartigan, MD  Admit date: 11/03/2020 Discharge date: 11/05/2020  Admitted From: home Disposition:  home CODE STATUS: Full code  Discharge Instructions    No wound care   Complete by: As directed        Hospital Course:  For full details, please see H&P, progress notes, consult notes and ancillary notes.  Briefly,  Maria Trujillo a 87 y.o.femalewith medical history significant ofhypothyroidism, arthritiswas in her usual state of health until this morning when she developed chest tightness.    1. NSTEMI patient with new onset chest pain. Trop up to 9000.  Seen by Dr. Josefa Half.  Started on IV heparin.  Left heart cath found Occluded small caliber RPL 3, no stent placed.  Pt was started on metop 12.5 mg BID, Lisinopril 5 mg daily, Imdur 30 mg daily, statin and plavix 75 mg daily.  Continued home ASA 81.  Pt will follow up with Dr. Saralyn Pilar in 1 week  2. HTN - stable.  On no meds as outpatinet.  Started on metop, Lisinopril and Imdur.  3. Hypothyroidism - Continued levothyroxine.    Discharge Diagnoses:  Active Problems:   HTN (hypertension)   Hyperlipidemia, unspecified   Restless leg syndrome   NSTEMI (non-ST elevated myocardial infarction) [facility])    Discharge Instructions:  Allergies as of 11/05/2020      Reactions   Dorzolamide Shortness Of Breath   Celecoxib Hives   Penicillins Other (See Comments)   Redness (sunburn type rash with peeling) Has patient had a PCN reaction causing immediate rash, facial/tongue/throat swelling, SOB or lightheadedness with hypotension: No Has patient had a PCN reaction causing severe rash involving mucus membranes or skin necrosis: No Has patient had a PCN reaction that required hospitalization No Has patient had a PCN reaction occurring within the last 10 years: Yes If all of the above answers are "NO", then may proceed with  Cephalosporin use.   Oxycodone    BP dropped   Shellfish Allergy Hives, Itching   Sulfa Antibiotics Other (See Comments)   Redness in eyes   Amoxicillin Rash   Amoxicillin-pot Clavulanate Other (See Comments)   Redness and flushing   Brimonidine Other (See Comments)   blisters   Cefuroxime Axetil Other (See Comments)   redness   Dipivefrin Other (See Comments)   sleepiness   Timolol Maleate Other (See Comments)   Redness      Medication List    TAKE these medications   acetaminophen 650 MG CR tablet Commonly known as: TYLENOL Take 650 mg by mouth daily as needed for pain.   aspirin 81 MG tablet Take 81 mg by mouth at bedtime.   atorvastatin 80 MG tablet Commonly known as: LIPITOR Take 1 tablet (80 mg total) by mouth at bedtime.   bimatoprost 0.03 % ophthalmic solution Commonly known as: LUMIGAN Place 1 drop into both eyes at bedtime.   Calcium Carbonate-Vitamin D3 600-400 MG-UNIT Tabs Take 1 tablet by mouth daily.   cholecalciferol 1000 units tablet Commonly known as: VITAMIN D Take 1,000 Units by mouth daily.   clopidogrel 75 MG tablet Commonly known as: PLAVIX Take 1 tablet (75 mg total) by mouth daily with breakfast. Start taking on: November 06, 2020   fexofenadine 180 MG tablet Commonly known as: ALLEGRA Take 180 mg by mouth daily as needed for allergies or rhinitis.   Fish Oil Omega-3 1000 MG Caps  Take 1,000 mg by mouth daily.   isosorbide mononitrate 30 MG 24 hr tablet Commonly known as: IMDUR Take 1 tablet (30 mg total) by mouth daily. Start taking on: November 06, 2020   levothyroxine 100 MCG tablet Commonly known as: SYNTHROID Take 100 mcg by mouth daily before breakfast.   lisinopril 5 MG tablet Commonly known as: ZESTRIL Take 1 tablet (5 mg total) by mouth daily. Start taking on: November 06, 2020   metoprolol tartrate 25 MG tablet Commonly known as: LOPRESSOR Take 0.5 tablets (12.5 mg total) by mouth 2 (two) times daily.    multivitamin with minerals Tabs tablet Take 1 tablet by mouth daily.   PRESERVISION AREDS 2 PO Take 2 tablets by mouth 2 (two) times daily.   timolol 0.5 % ophthalmic solution Commonly known as: TIMOPTIC Place 1 drop into both eyes daily.        Follow-up Information    [name], Alexander, MD. Schedule an appointment as soon as possible for a visit in 1 week(s).   Specialty: Cardiology Contact information: [facility] [place] [address] [phone]        Sofie Hartigan, MD. Schedule an appointment as soon as possible for a visit in 1 week(s).   Specialty: Family Medicine Contact information: [place] [address] [phone]               Allergies  Allergen Reactions  . Dorzolamide Shortness Of Breath  . Celecoxib Hives  . Penicillins Other (See Comments)    Redness (sunburn type rash with peeling) Has patient had a PCN reaction causing immediate rash, facial/tongue/throat swelling, SOB or lightheadedness with hypotension: No Has patient had a PCN reaction causing severe rash involving mucus membranes or skin necrosis: No Has patient had a PCN reaction that required hospitalization No Has patient had a PCN reaction occurring within the last 10 years: Yes If all of the above answers are "NO", then may proceed with Cephalosporin use.   Marland Kitchen Oxycodone     BP dropped  . Shellfish Allergy Hives and Itching  . Sulfa Antibiotics Other (See Comments)    Redness in eyes  . Amoxicillin Rash  . Amoxicillin-Pot Clavulanate Other (See Comments)    Redness and flushing  . Brimonidine Other (See Comments)    blisters  . Cefuroxime Axetil Other (See Comments)    redness  . Dipivefrin Other (See Comments)    sleepiness  . Timolol Maleate Other (See Comments)    Redness     The results of significant diagnostics from this hospitalization (including imaging,  microbiology, ancillary and laboratory) are listed below for reference.   Consultations:   Procedures/Studies: DG Chest 2 View  Result Date: 11/03/2020 CLINICAL DATA:  Chest pain EXAM: CHEST - 2 VIEW COMPARISON:  None. FINDINGS: Borderline cardiomegaly. Coarse/interstitial lung markings bilaterally suggesting chronic interstitial lung disease. No confluent opacity to suggest consolidating pneumonia. No pleural effusion or pneumothorax is seen. Mild degenerative spondylosis of the kyphotic thoracic spine. No acute appearing osseous abnormality. Atherosclerosis noted at the aortic arch. IMPRESSION: 1. No active cardiopulmonary disease. No evidence of pneumonia or pulmonary edema. 2. Probable chronic interstitial lung disease. 3. Borderline cardiomegaly. 4. Aortic atherosclerosis. Electronically Signed   By: Franki Cabot M.D.   On: 11/03/2020 10:37   CARDIAC CATHETERIZATION  Result Date: 11/04/2020  3rd RPL lesion is 100% stenosed.  1.  Non-ST elevation myocardial infarction 2.  Occluded small  caliber RPL 3 3.  Normal left ventricular function Recommendations 1.  Medical therapy 2.  Add Plavix 75 mg daily 3.  Aggressive risk factor modification 4.  May discharge home, after ambulation, either today after 5 PM or in a.m. 5.  Follow-up with me 1 week after discharge   ECHOCARDIOGRAM COMPLETE  Result Date: 11/04/2020    ECHOCARDIOGRAM REPORT   Patient Name:   Maria Trujillo Date of Exam: 11/03/2020 Medical Rec #:  [id number]     Height:       65.0 in Accession #:    [id number]    Weight:       185.0 lb Date of Birth:  12/21/1933      BSA:          1.914 m Patient Age:    4 years      BP:           144/58 mmHg Patient Gender: F             HR:           68 bpm. Exam Location:  [facility] Procedure: 2D Echo, Cardiac Doppler and Color Doppler Indications:     Acute myocardial infarction, unspecified I21.9  History:         Patient has no prior history of Echocardiogram examinations.                  Risk  Factors:Hypertension.  Sonographer:     Alyse Low Roar Referring Phys:  [place] Diagnosing Phys: Isaias Cowman MD IMPRESSIONS  1. Left ventricular ejection fraction, by estimation, is 55 to 60%. The left ventricle has normal function. The left ventricle has no regional wall motion abnormalities. Left ventricular diastolic parameters were normal.  2. Right ventricular systolic function is normal. The right ventricular size is normal.  3. The mitral valve is normal in structure. Mild to moderate mitral valve regurgitation. No evidence of mitral stenosis.  4. Tricuspid valve regurgitation is mild to moderate.  5. The aortic valve is normal in structure. Aortic valve regurgitation is not visualized. No aortic stenosis is present.  6. The inferior vena cava is normal in size with greater than 50% respiratory variability, suggesting right atrial pressure of 3 mmHg. FINDINGS  Left Ventricle: Left ventricular ejection fraction, by estimation, is 55 to 60%. The left ventricle has normal function. The left ventricle has no regional wall motion abnormalities. The left ventricular internal cavity size was normal in size. There is  no left ventricular hypertrophy. Left ventricular diastolic parameters were normal. Right Ventricle: The right ventricular size is normal. No increase in right ventricular wall thickness. Right ventricular systolic function is normal. Left Atrium: Left atrial size was normal in size. Right Atrium: Right atrial size was normal in size. Pericardium: There is no evidence of pericardial effusion. Mitral Valve: The mitral valve is normal in structure. Mild to moderate mitral valve regurgitation. No evidence of mitral valve stenosis. Tricuspid Valve: The tricuspid valve is normal in structure. Tricuspid valve regurgitation is mild to moderate. No evidence of tricuspid stenosis. Aortic Valve: The aortic valve is normal in structure. Aortic valve regurgitation is not visualized. No  aortic stenosis is present. Aortic valve peak gradient measures 10.8 mmHg. Pulmonic Valve: The pulmonic valve was normal in structure. Pulmonic valve regurgitation is not visualized. No evidence of pulmonic stenosis. Aorta: The aortic root is normal in size and structure. Venous: The inferior vena cava is normal in size with greater than 50% respiratory  variability, suggesting right atrial pressure of 3 mmHg. IAS/Shunts: No atrial level shunt detected by color flow Doppler.  LEFT VENTRICLE PLAX 2D LVIDd:         5.20 cm  Diastology LVIDs:         3.80 cm  LV e' medial:    7.07 cm/s LV PW:         1.10 cm  LV E/e' medial:  9.9 LV IVS:        1.00 cm  LV e' lateral:   11.00 cm/s LVOT diam:     2.00 cm  LV E/e' lateral: 6.4 LVOT Area:     3.14 cm  RIGHT VENTRICLE RV Mid diam:    3.40 cm RV S prime:     13.90 cm/s TAPSE (M-mode): 2.1 cm LEFT ATRIUM             Index       RIGHT ATRIUM           Index LA diam:        4.25 cm 2.22 cm/m  RA Area:     20.60 cm LA Vol (A2C):   69.3 ml 36.21 ml/m RA Volume:   53.60 ml  28.01 ml/m LA Vol (A4C):   76.4 ml 39.92 ml/m LA Biplane Vol: 73.9 ml 38.62 ml/m  AORTIC VALVE                PULMONIC VALVE AV Area (Vmax): 1.70 cm    PV Vmax:        0.87 m/s AV Vmax:        164.00 cm/s PV Peak grad:   3.0 mmHg AV Peak Grad:   10.8 mmHg   RVOT Peak grad: 1 mmHg LVOT Vmax:      88.70 cm/s  AORTA Ao Root diam: 3.10 cm MITRAL VALVE               TRICUSPID VALVE MV Area (PHT): 2.30 cm    TR Peak grad:   32.3 mmHg MV Decel Time: 330 msec    TR Vmax:        284.00 cm/s MV E velocity: 70.30 cm/s MV A velocity: 48.00 cm/s  SHUNTS MV E/A ratio:  1.46        Systemic Diam: 2.00 cm MV A Prime:    5.3 cm/s Isaias Cowman MD Electronically signed by Isaias Cowman MD Signature Date/Time: 11/04/2020/9:32:27 AM    Final       Labs: BNP (last 3 results) No results for input(s): BNP in the last 8760 hours. Basic Metabolic Panel: Recent Labs  Lab 11/03/20 1002 11/04/20 0315  11/05/20 0445  NA 141 141 142  K 4.1 3.7 3.4*  CL 106 105 107  CO2 25 24 24   GLUCOSE 103* 93 95  BUN 16 15 13   CREATININE 0.49 0.50 0.45  CALCIUM 9.6 9.1 8.7*  MG  --   --  1.6*   Liver Function Tests: No results for input(s): AST, ALT, ALKPHOS, BILITOT, PROT, ALBUMIN in the last 168 hours. No results for input(s): LIPASE, AMYLASE in the last 168 hours. No results for input(s): AMMONIA in the last 168 hours. CBC: Recent Labs  Lab 11/03/20 1002 11/04/20 0315 11/05/20 0445  WBC 8.0 6.6 6.2  HGB 11.0* 11.1* 10.3*  HCT 33.5* 33.0* 31.0*  MCV 92.3 89.7 90.4  PLT 162 147* 135*   Cardiac Enzymes: No results for input(s): CKTOTAL, CKMB, CKMBINDEX, TROPONINI in the last 168 hours. BNP: Invalid  input(s): POCBNP CBG: No results for input(s): GLUCAP in the last 168 hours. D-Dimer No results for input(s): DDIMER in the last 72 hours. Hgb A1c No results for input(s): HGBA1C in the last 72 hours. Lipid Profile Recent Labs    11/04/20 0315  CHOL 150  HDL 60  LDLCALC 81  TRIG 45  CHOLHDL 2.5   Thyroid function studies Recent Labs    11/03/20 1327  TSH 1.846   Anemia work up No results for input(s): VITAMINB12, FOLATE, FERRITIN, TIBC, IRON, RETICCTPCT in the last 72 hours. Urinalysis    Component Value Date/Time   COLORURINE YELLOW (A) 02/02/2017 0954   APPEARANCEUR CLEAR (A) 02/02/2017 0954   APPEARANCEUR Clear 12/12/2012 1557   LABSPEC 1.008 02/02/2017 0954   LABSPEC 1.013 12/12/2012 1557   PHURINE 6.0 02/02/2017 0954   GLUCOSEU NEGATIVE 02/02/2017 0954   GLUCOSEU Negative 12/12/2012 1557   HGBUR NEGATIVE 02/02/2017 0954   BILIRUBINUR NEGATIVE 02/02/2017 0954   BILIRUBINUR Negative 12/12/2012 1557   KETONESUR NEGATIVE 02/02/2017 0954   PROTEINUR NEGATIVE 02/02/2017 0954   NITRITE NEGATIVE 02/02/2017 0954   LEUKOCYTESUR NEGATIVE 02/02/2017 0954   LEUKOCYTESUR Trace 12/12/2012 1557   Sepsis Labs Invalid input(s): PROCALCITONIN,  WBC,   LACTICIDVEN Microbiology Recent Results (from the past 240 hour(s))  SARS CORONAVIRUS 2 (TAT 6-24 HRS) Nasopharyngeal Nasopharyngeal Swab     Status: None   Collection Time: 11/03/20 10:05 AM   Specimen: Nasopharyngeal Swab  Result Value Ref Range Status   SARS Coronavirus 2 NEGATIVE NEGATIVE Final    Comment: (NOTE) SARS-CoV-2 target nucleic acids are NOT DETECTED.  The SARS-CoV-2 RNA is generally detectable in upper and lower respiratory specimens during the acute phase of infection. Negative results do not preclude SARS-CoV-2 infection, do not rule out co-infections with other pathogens, and should not be used as the sole basis for treatment or other patient management decisions. Negative results must be combined with clinical observations, patient history, and epidemiological information. The expected result is Negative.  Fact Sheet for Patients: SugarRoll.be  Fact Sheet for Healthcare Providers: https://www.woods-mathews.com/  This test is not yet approved or cleared by the [place] FDA and  has been authorized for detection and/or diagnosis of SARS-CoV-2 by FDA under an Emergency Use Authorization (EUA). This EUA will remain  in effect (meaning this test can be used) for the duration of the COVID-19 declaration under Se ction 564(b)(1) of the Act, 21 U.S.C. section 360bbb-3(b)(1), unless the authorization is terminated or revoked sooner.  Performed at [facility], [place] [address], [place], [place] [address]      Total time spend on discharging this patient, including the last patient exam, discussing the hospital stay, instructions for ongoing care as it relates to all pertinent caregivers, as well as preparing the medical discharge records, prescriptions, and/or referrals as applicable, is 30 minutes.    Enzo Bi, MD  [facility] 11/05/2020, 9:21 AM

## 2020-11-05 NOTE — Progress Notes (Signed)
Mobility Specialist - Progress Note   11/05/20 1110  Mobility  Activity Ambulated in room  Level of Assistance Modified independent, requires aide device or extra time  Assistive Device Other (Comment) (pushed IV pole)  Distance Ambulated (ft) 40 ft  Mobility Response Tolerated well  Mobility performed by Mobility specialist  $Mobility charge 1 Mobility    Pre-mobility: 49 HR, 96% SpO2 Post-mobility: 55 HR, 98% SpO2   Pt laying in bed upon arrival. Pt agreed to session. Pt ambulated 40' total in room mod. Independently. Pt states she ambulates w/o AD at home, but utilizes a RW outside of home at baseline. Pt pushed her IV pole while ambulating during this session. No LOB noted. No c/o pain or SOB. Overall, pt tolerated session very well. Pt left sitting EOB w/ nurse present in room.     Cordarrel Stiefel Mobility Specialist  11/05/20, 11:13 AM

## 2020-11-05 NOTE — Progress Notes (Signed)
[facility]    SUBJECTIVE: The patient denies chest pain or shortness of breath. She has ambulated to the bathroom without difficulty.   Vitals:   11/04/20 1547 11/04/20 2027 11/05/20 0337 11/05/20 0735  BP: (!) 125/59 (!) 109/57 (!) 115/52 133/61  Pulse: 69 64 61 64  Resp:  17 17 18   Temp: 98 F (36.7 C) 97.9 F (36.6 C) 97.8 F (36.6 C) 98 F (36.7 C)  TempSrc: Oral Oral Oral Oral  SpO2: 98% 95% 96% 94%  Weight:   84 kg   Height:         Intake/Output Summary (Last 24 hours) at 11/05/2020 0819 Last data filed at 11/05/2020 0441 Gross per 24 hour  Intake 101.38 ml  Output 0 ml  Net 101.38 ml      PHYSICAL EXAM  General: Well developed, well nourished, in no acute distress, sitting up in bed, appears comfortable HEENT:  Normocephalic and atramatic Neck:  No JVD.  Lungs: Clear bilaterally to auscultation, normal effort of breathing on RA. Heart: HRRR . Normal S1 and S2 without gallops or murmurs.  Abdomen: nondistended, soft Extremities: No clubbing, cyanosis or edema.   Right groin: gauze with tegaderm in place without surrounding erythema, edema or drainage Neuro: Alert and oriented X 3. Psych:  Good affect, responds appropriately   LABS: Basic Metabolic Panel: Recent Labs    11/04/20 0315 11/05/20 0445  NA 141 142  K 3.7 3.4*  CL 105 107  CO2 24 24  GLUCOSE 93 95  BUN 15 13  CREATININE 0.50 0.45  CALCIUM 9.1 8.7*  MG  --  1.6*   Liver Function Tests: No results for input(s): AST, ALT, ALKPHOS, BILITOT, PROT, ALBUMIN in the last 72 hours. No results for input(s): LIPASE, AMYLASE in the last 72 hours. CBC: Recent Labs    11/04/20 0315 11/05/20 0445  WBC 6.6 6.2  HGB 11.1* 10.3*  HCT 33.0* 31.0*  MCV 89.7 90.4  PLT 147* 135*   Cardiac Enzymes: No results for input(s): CKTOTAL, CKMB, CKMBINDEX, TROPONINI in the last 72 hours. BNP: Invalid input(s): POCBNP D-Dimer: No results for input(s): DDIMER in the last 72 hours. Hemoglobin  A1C: No results for input(s): HGBA1C in the last 72 hours. Fasting Lipid Panel: Recent Labs    11/04/20 0315  CHOL 150  HDL 60  LDLCALC 81  TRIG 45  CHOLHDL 2.5   Thyroid Function Tests: Recent Labs    11/03/20 1327  TSH 1.846   Anemia Panel: No results for input(s): VITAMINB12, FOLATE, FERRITIN, TIBC, IRON, RETICCTPCT in the last 72 hours.  DG Chest 2 View  Result Date: 11/03/2020 CLINICAL DATA:  Chest pain EXAM: CHEST - 2 VIEW COMPARISON:  None. FINDINGS: Borderline cardiomegaly. Coarse/interstitial lung markings bilaterally suggesting chronic interstitial lung disease. No confluent opacity to suggest consolidating pneumonia. No pleural effusion or pneumothorax is seen. Mild degenerative spondylosis of the kyphotic thoracic spine. No acute appearing osseous abnormality. Atherosclerosis noted at the aortic arch. IMPRESSION: 1. No active cardiopulmonary disease. No evidence of pneumonia or pulmonary edema. 2. Probable chronic interstitial lung disease. 3. Borderline cardiomegaly. 4. Aortic atherosclerosis. Electronically Signed   By: Franki Cabot M.D.   On: 11/03/2020 10:37   CARDIAC CATHETERIZATION  Result Date: 11/04/2020  3rd RPL lesion is 100% stenosed.  1.  Non-ST elevation myocardial infarction 2.  Occluded small caliber RPL 3 3.  Normal left ventricular function Recommendations 1.  Medical therapy 2.  Add Plavix 75 mg daily 3.  Aggressive  risk factor modification 4.  May discharge home, after ambulation, either today after 5 PM or in a.m. 5.  Follow-up with me 1 week after discharge   ECHOCARDIOGRAM COMPLETE  Result Date: 11/04/2020    ECHOCARDIOGRAM REPORT   Patient Name:   Maria Trujillo Date of Exam: 11/03/2020 Medical Rec #:  [id number]     Height:       65.0 in Accession #:    [id number]    Weight:       185.0 lb Date of Birth:  10-03-1934      BSA:          1.914 m Patient Age:    87 years      BP:           144/58 mmHg Patient Gender: F             HR:           68 bpm. Exam  Location:  [facility] Procedure: 2D Echo, Cardiac Doppler and Color Doppler Indications:     Acute myocardial infarction, unspecified I21.9  History:         Patient has no prior history of Echocardiogram examinations.                  Risk Factors:Hypertension.  Sonographer:     Alyse Low Roar Referring Phys:  [place] Diagnosing Phys: Isaias Cowman MD IMPRESSIONS  1. Left ventricular ejection fraction, by estimation, is 55 to 60%. The left ventricle has normal function. The left ventricle has no regional wall motion abnormalities. Left ventricular diastolic parameters were normal.  2. Right ventricular systolic function is normal. The right ventricular size is normal.  3. The mitral valve is normal in structure. Mild to moderate mitral valve regurgitation. No evidence of mitral stenosis.  4. Tricuspid valve regurgitation is mild to moderate.  5. The aortic valve is normal in structure. Aortic valve regurgitation is not visualized. No aortic stenosis is present.  6. The inferior vena cava is normal in size with greater than 50% respiratory variability, suggesting right atrial pressure of 3 mmHg. FINDINGS  Left Ventricle: Left ventricular ejection fraction, by estimation, is 55 to 60%. The left ventricle has normal function. The left ventricle has no regional wall motion abnormalities. The left ventricular internal cavity size was normal in size. There is  no left ventricular hypertrophy. Left ventricular diastolic parameters were normal. Right Ventricle: The right ventricular size is normal. No increase in right ventricular wall thickness. Right ventricular systolic function is normal. Left Atrium: Left atrial size was normal in size. Right Atrium: Right atrial size was normal in size. Pericardium: There is no evidence of pericardial effusion. Mitral Valve: The mitral valve is normal in structure. Mild to moderate mitral valve regurgitation. No evidence of mitral valve stenosis. Tricuspid Valve: The  tricuspid valve is normal in structure. Tricuspid valve regurgitation is mild to moderate. No evidence of tricuspid stenosis. Aortic Valve: The aortic valve is normal in structure. Aortic valve regurgitation is not visualized. No aortic stenosis is present. Aortic valve peak gradient measures 10.8 mmHg. Pulmonic Valve: The pulmonic valve was normal in structure. Pulmonic valve regurgitation is not visualized. No evidence of pulmonic stenosis. Aorta: The aortic root is normal in size and structure. Venous: The inferior vena cava is normal in size with greater than 50% respiratory variability, suggesting right atrial pressure of 3 mmHg. IAS/Shunts: No atrial level shunt detected by color flow Doppler.  LEFT VENTRICLE PLAX 2D LVIDd:  5.20 cm  Diastology LVIDs:         3.80 cm  LV e' medial:    7.07 cm/s LV PW:         1.10 cm  LV E/e' medial:  9.9 LV IVS:        1.00 cm  LV e' lateral:   11.00 cm/s LVOT diam:     2.00 cm  LV E/e' lateral: 6.4 LVOT Area:     3.14 cm  RIGHT VENTRICLE RV Mid diam:    3.40 cm RV S prime:     13.90 cm/s TAPSE (M-mode): 2.1 cm LEFT ATRIUM             Index       RIGHT ATRIUM           Index LA diam:        4.25 cm 2.22 cm/m  RA Area:     20.60 cm LA Vol (A2C):   69.3 ml 36.21 ml/m RA Volume:   53.60 ml  28.01 ml/m LA Vol (A4C):   76.4 ml 39.92 ml/m LA Biplane Vol: 73.9 ml 38.62 ml/m  AORTIC VALVE                PULMONIC VALVE AV Area (Vmax): 1.70 cm    PV Vmax:        0.87 m/s AV Vmax:        164.00 cm/s PV Peak grad:   3.0 mmHg AV Peak Grad:   10.8 mmHg   RVOT Peak grad: 1 mmHg LVOT Vmax:      88.70 cm/s  AORTA Ao Root diam: 3.10 cm MITRAL VALVE               TRICUSPID VALVE MV Area (PHT): 2.30 cm    TR Peak grad:   32.3 mmHg MV Decel Time: 330 msec    TR Vmax:        284.00 cm/s MV E velocity: 70.30 cm/s MV A velocity: 48.00 cm/s  SHUNTS MV E/A ratio:  1.46        Systemic Diam: 2.00 cm MV A Prime:    5.3 cm/s Isaias Cowman MD Electronically signed by Isaias Cowman MD Signature Date/Time: 11/04/2020/9:32:27 AM    Final      Echo LVEF 55-60%  TELEMETRY: sinus bradycardia, 53 bpm  ASSESSMENT AND PLAN:  Active Problems:   HTN (hypertension)   Hyperlipidemia, unspecified   Restless leg syndrome   NSTEMI (non-ST elevated myocardial infarction) ([place])     1. NSTEMI, status post cardiac catheterization, which revealed preserved left ventricular function with 100% RPL 3 occlusion, best treated medically. 2D echocardiogram reveals normal left ventricular function with LVEF 55-60% with mild to moderate mitral and tricuspid regurgitation. 2. Essential hypertension, well controlled this morning   Recommendations: 1. Continue aspirin 81 mg and Plavix 2. Continue high intensity statin 3. Continue metoprolol tartrate 12.5 mg BID and isosorbide mononitrate 30 mg daily 4. Add lisinopril 5 mg daily 5. Cardiac rehab 6. Recommend discharge today and follow-up with Dr. Saralyn Pilar in 1 week   Sharolyn Douglas 11/05/2020 8:19 AM   [facility] Discharge   Aspirin prescribed at discharge:  Yes  High Intensity Statin Prescribed? (Lipitor 40-80mg  or Crestor 20-40mg ): Yes  Beta Blocker Prescribed: Yes  ADP Receptor Inhibitor Prescribed? (i.e. Plavix etc.-Includes Medically Managed Patients): Yes  ACEI/ARB Prescribed? (If NO document contraindications)  Yes  Aldosterone Inhibitor Prescribed? No: not indicated  Was EF assessed during THIS hospitalization? Yes  (  YES = Measured in current episode of care or document plan to evaluate after discharge.)  Was EF < 40% ? No: 55-60%  Was Cardiac Rehab II ordered? (Includes Medically managed Patients): Yes, per hospitalist  Was Smoking Cessation Advice provided?  No: not indicated

## 2020-11-28 DIAGNOSIS — E7800 Pure hypercholesterolemia, unspecified: Secondary | ICD-10-CM | POA: Diagnosis not present

## 2020-11-28 DIAGNOSIS — I2510 Atherosclerotic heart disease of native coronary artery without angina pectoris: Secondary | ICD-10-CM | POA: Diagnosis not present

## 2020-11-28 DIAGNOSIS — I214 Non-ST elevation (NSTEMI) myocardial infarction: Secondary | ICD-10-CM | POA: Diagnosis not present

## 2020-11-28 DIAGNOSIS — I10 Essential (primary) hypertension: Secondary | ICD-10-CM | POA: Diagnosis not present

## 2020-11-28 DIAGNOSIS — Z79899 Other long term (current) drug therapy: Secondary | ICD-10-CM | POA: Diagnosis not present

## 2020-12-03 DIAGNOSIS — I2510 Atherosclerotic heart disease of native coronary artery without angina pectoris: Secondary | ICD-10-CM | POA: Diagnosis not present

## 2020-12-03 DIAGNOSIS — E7800 Pure hypercholesterolemia, unspecified: Secondary | ICD-10-CM | POA: Diagnosis not present

## 2020-12-03 DIAGNOSIS — E039 Hypothyroidism, unspecified: Secondary | ICD-10-CM | POA: Diagnosis not present

## 2020-12-03 DIAGNOSIS — G2581 Restless legs syndrome: Secondary | ICD-10-CM | POA: Diagnosis not present

## 2020-12-03 DIAGNOSIS — Z0000 Encounter for general adult medical examination without abnormal findings: Secondary | ICD-10-CM | POA: Diagnosis not present

## 2020-12-03 DIAGNOSIS — I10 Essential (primary) hypertension: Secondary | ICD-10-CM | POA: Diagnosis not present

## 2020-12-03 DIAGNOSIS — K219 Gastro-esophageal reflux disease without esophagitis: Secondary | ICD-10-CM | POA: Diagnosis not present

## 2020-12-03 DIAGNOSIS — J301 Allergic rhinitis due to pollen: Secondary | ICD-10-CM | POA: Diagnosis not present

## 2020-12-25 ENCOUNTER — Encounter: Payer: PPO | Attending: Cardiology | Admitting: *Deleted

## 2020-12-25 ENCOUNTER — Other Ambulatory Visit: Payer: Self-pay

## 2020-12-25 DIAGNOSIS — I214 Non-ST elevation (NSTEMI) myocardial infarction: Secondary | ICD-10-CM

## 2020-12-25 DIAGNOSIS — E039 Hypothyroidism, unspecified: Secondary | ICD-10-CM | POA: Insufficient documentation

## 2020-12-25 DIAGNOSIS — M75121 Complete rotator cuff tear or rupture of right shoulder, not specified as traumatic: Secondary | ICD-10-CM | POA: Insufficient documentation

## 2020-12-25 DIAGNOSIS — I241 Dressler's syndrome: Secondary | ICD-10-CM | POA: Insufficient documentation

## 2020-12-25 NOTE — Progress Notes (Signed)
Initial telephone orientation completed. Diagnosis can be found in [facility] 1/9. EP orientation scheduled for Thursday 3/10 at 2pm.

## 2020-12-27 DIAGNOSIS — R002 Palpitations: Secondary | ICD-10-CM | POA: Diagnosis not present

## 2020-12-27 DIAGNOSIS — I2510 Atherosclerotic heart disease of native coronary artery without angina pectoris: Secondary | ICD-10-CM | POA: Diagnosis not present

## 2020-12-27 DIAGNOSIS — I10 Essential (primary) hypertension: Secondary | ICD-10-CM | POA: Diagnosis not present

## 2020-12-27 DIAGNOSIS — I214 Non-ST elevation (NSTEMI) myocardial infarction: Secondary | ICD-10-CM | POA: Diagnosis not present

## 2020-12-31 DIAGNOSIS — H510 Palsy (spasm) of conjugate gaze: Secondary | ICD-10-CM | POA: Diagnosis not present

## 2020-12-31 DIAGNOSIS — H3530 Unspecified macular degeneration: Secondary | ICD-10-CM | POA: Diagnosis not present

## 2020-12-31 DIAGNOSIS — Z961 Presence of intraocular lens: Secondary | ICD-10-CM | POA: Diagnosis not present

## 2020-12-31 DIAGNOSIS — H401133 Primary open-angle glaucoma, bilateral, severe stage: Secondary | ICD-10-CM | POA: Diagnosis not present

## 2021-01-02 ENCOUNTER — Other Ambulatory Visit: Payer: Self-pay

## 2021-01-02 VITALS — Ht 63.25 in | Wt 188.1 lb

## 2021-01-02 DIAGNOSIS — I241 Dressler's syndrome: Secondary | ICD-10-CM | POA: Diagnosis not present

## 2021-01-02 DIAGNOSIS — I214 Non-ST elevation (NSTEMI) myocardial infarction: Secondary | ICD-10-CM

## 2021-01-02 NOTE — Patient Instructions (Signed)
Patient Instructions  Patient Details  Name: Maria Trujillo MRN: [id number] Date of Birth: 25-Dec-1933 Referring Provider:  Isaias Cowman, MD  Below are your personal goals for exercise, nutrition, and risk factors. Our goal is to help you stay on track towards obtaining and maintaining these goals. We will be discussing your progress on these goals with you throughout the program.  Initial Exercise Prescription:  Initial Exercise Prescription - 01/02/21 1500      Date of Initial Exercise RX and Referring Provider   Date 01/02/21    Referring Provider [name]      Treadmill   MPH 1    Grade 0    Minutes 15    METs 1.77      Recumbant Bike   Level 1    RPM 60    Minutes 15    METs 1      NuStep   Level 1    SPM 80    Minutes 15    METs 1      REL-XR   Level 1    Speed 50    Minutes 15    METs 1      Prescription Details   Frequency (times per week) 2    Duration Progress to 30 minutes of continuous aerobic without signs/symptoms of physical distress      Intensity   THRR 40-80% of Max Heartrate 87-119    Ratings of Perceived Exertion 11-13    Perceived Dyspnea 0-4      Resistance Training   Training Prescription Yes    Weight 2 lb    Reps 10-15           Exercise Goals: Frequency: Be able to perform aerobic exercise two to three times per week in program working toward 2-5 days per week of home exercise.  Intensity: Work with a perceived exertion of 11 (fairly light) - 15 (hard) while following your exercise prescription.  We will make changes to your prescription with you as you progress through the program.   Duration: Be able to do 30 to 45 minutes of continuous aerobic exercise in addition to a 5 minute warm-up and a 5 minute cool-down routine.   Nutrition Goals: Your personal nutrition goals will be established when you do your nutrition analysis with the dietician.  The following are general nutrition guidelines to follow: Cholesterol <  200mg /day Sodium < 1500mg /day Fiber: Women over 50 yrs - 21 grams per day  Personal Goals:  Personal Goals and Risk Factors at Admission - 01/02/21 1549      Core Components/Risk Factors/Patient Goals on Admission    Weight Management Yes    Intervention Weight Management: Develop a combined nutrition and exercise program designed to reach desired caloric intake, while maintaining appropriate intake of nutrient and fiber, sodium and fats, and appropriate energy expenditure required for the weight goal.;Weight Management: Provide education and appropriate resources to help participant work on and attain dietary goals.    Admit Weight 188 lb 1.6 oz (85.3 kg)    Goal Weight: Short Term 185 lb (83.9 kg)    Goal Weight: Long Term 185 lb (83.9 kg)    Expected Outcomes Weight Maintenance: Understanding of the daily nutrition guidelines, which includes 25-35% calories from fat, 7% or less cal from saturated fats, less than 200mg  cholesterol, less than 1.5gm of sodium, & 5 or more servings of fruits and vegetables daily;Understanding recommendations for meals to include 15-35% energy as protein, 25-35% energy from fat,  35-60% energy from carbohydrates, less than 200mg  of dietary cholesterol, 20-35 gm of total fiber daily;Understanding of distribution of calorie intake throughout the day with the consumption of 4-5 meals/snacks    Hypertension Yes    Intervention Provide education on lifestyle modifcations including regular physical activity/exercise, weight management, moderate sodium restriction and increased consumption of fresh fruit, vegetables, and low fat dairy, alcohol moderation, and smoking cessation.;Monitor prescription use compliance.    Expected Outcomes Short Term: Continued assessment and intervention until BP is < 140/58mm HG in hypertensive participants. < 130/5mm HG in hypertensive participants with diabetes, heart failure or chronic kidney disease.;Long Term: Maintenance of blood pressure  at goal levels.    Lipids Yes    Intervention Provide education and support for participant on nutrition & aerobic/resistive exercise along with prescribed medications to achieve LDL 70mg , HDL >40mg .           Tobacco Use Initial Evaluation: Social History   Tobacco Use  Smoking Status Never Smoker  Smokeless Tobacco Never Used    Exercise Goals and Review:  Exercise Goals    Row Name 01/02/21 1548             Exercise Goals   Increase Physical Activity Yes       Intervention Provide advice, education, support and counseling about physical activity/exercise needs.;Develop an individualized exercise prescription for aerobic and resistive training based on initial evaluation findings, risk stratification, comorbidities and participant's personal goals.       Expected Outcomes Short Term: Attend rehab on a regular basis to increase amount of physical activity.;Long Term: Add in home exercise to make exercise part of routine and to increase amount of physical activity.;Long Term: Exercising regularly at least 3-5 days a week.       Increase Strength and Stamina Yes       Intervention Provide advice, education, support and counseling about physical activity/exercise needs.;Develop an individualized exercise prescription for aerobic and resistive training based on initial evaluation findings, risk stratification, comorbidities and participant's personal goals.       Expected Outcomes Short Term: Increase workloads from initial exercise prescription for resistance, speed, and METs.;Long Term: Improve cardiorespiratory fitness, muscular endurance and strength as measured by increased METs and functional capacity (6MWT);Short Term: Perform resistance training exercises routinely during rehab and add in resistance training at home       Able to understand and use rate of perceived exertion (RPE) scale Yes       Intervention Provide education and explanation on how to use RPE scale        Expected Outcomes Short Term: Able to use RPE daily in rehab to express subjective intensity level;Long Term:  Able to use RPE to guide intensity level when exercising independently       Able to understand and use Dyspnea scale Yes       Intervention Provide education and explanation on how to use Dyspnea scale       Expected Outcomes Short Term: Able to use Dyspnea scale daily in rehab to express subjective sense of shortness of breath during exertion;Long Term: Able to use Dyspnea scale to guide intensity level when exercising independently       Knowledge and understanding of Target Heart Rate Range (THRR) Yes       Intervention Provide education and explanation of THRR including how the numbers were predicted and where they are located for reference       Expected Outcomes Short Term: Able to state/look up  THRR;Short Term: Able to use daily as guideline for intensity in rehab;Long Term: Able to use THRR to govern intensity when exercising independently       Able to check pulse independently Yes       Intervention Provide education and demonstration on how to check pulse in carotid and radial arteries.;Review the importance of being able to check your own pulse for safety during independent exercise       Expected Outcomes Short Term: Able to explain why pulse checking is important during independent exercise;Long Term: Able to check pulse independently and accurately       Understanding of Exercise Prescription Yes       Intervention Provide education, explanation, and written materials on patient's individual exercise prescription       Expected Outcomes Short Term: Able to explain program exercise prescription;Long Term: Able to explain home exercise prescription to exercise independently              Copy of goals given to participant.

## 2021-01-02 NOTE — Progress Notes (Signed)
Cardiac Individual Treatment Plan  Patient Details  Name: Maria Trujillo MRN: [id number] Date of Birth: [birth year] Referring Provider:   Flowsheet Row Cardiac Rehab from 01/02/2021 in [facility] Cardiac and Pulmonary Rehab  Referring Provider [name]      Initial Encounter Date:  Flowsheet Row Cardiac Rehab from 01/02/2021 in [facility] Cardiac and Pulmonary Rehab  Date 01/02/21      Visit Diagnosis: NSTEMI (non-ST elevated myocardial infarction) [facility])  Patient's Home Medications on Admission:  Current Outpatient Medications:  .  acetaminophen (TYLENOL) 650 MG CR tablet, Take 650 mg by mouth daily as needed for pain., Disp: , Rfl:  .  aspirin 81 MG tablet, Take 81 mg by mouth at bedtime., Disp: , Rfl:  .  atorvastatin (LIPITOR) 80 MG tablet, Take 1 tablet (80 mg total) by mouth at bedtime., Disp: 30 tablet, Rfl: 2 .  bimatoprost (LUMIGAN) 0.03 % ophthalmic solution, Place 1 drop into both eyes at bedtime., Disp: , Rfl:  .  Calcium Carb-Cholecalciferol (CALCIUM CARBONATE-VITAMIN D3) 600-400 MG-UNIT TABS, Take 1 tablet by mouth daily., Disp: , Rfl:  .  cholecalciferol (VITAMIN D) 1000 units tablet, Take 1,000 Units by mouth daily., Disp: , Rfl:  .  clopidogrel (PLAVIX) 75 MG tablet, Take 1 tablet (75 mg total) by mouth daily with breakfast., Disp: 30 tablet, Rfl: 2 .  fexofenadine (ALLEGRA) 180 MG tablet, Take 180 mg by mouth daily as needed for allergies or rhinitis., Disp: , Rfl:  .  isosorbide mononitrate (IMDUR) 30 MG 24 hr tablet, Take 1 tablet (30 mg total) by mouth daily., Disp: 30 tablet, Rfl: 2 .  levothyroxine (SYNTHROID, LEVOTHROID) 100 MCG tablet, Take 100 mcg by mouth daily before breakfast., Disp: , Rfl:  .  lisinopril (ZESTRIL) 5 MG tablet, Take 1 tablet (5 mg total) by mouth daily., Disp: 30 tablet, Rfl: 2 .  metoprolol tartrate (LOPRESSOR) 25 MG tablet, Take 0.5 tablets (12.5 mg total) by mouth 2 (two) times daily., Disp: 30 tablet, Rfl: 2 .  Multiple Vitamin (MULTIVITAMIN WITH  MINERALS) TABS tablet, Take 1 tablet by mouth daily., Disp: , Rfl:  .  Multiple Vitamins-Minerals (PRESERVISION AREDS 2 PO), Take 2 tablets by mouth 2 (two) times daily., Disp: , Rfl:  .  Omega-3 Fatty Acids (FISH OIL OMEGA-3) 1000 MG CAPS, Take 1,000 mg by mouth daily., Disp: , Rfl:  .  timolol (TIMOPTIC) 0.5 % ophthalmic solution, Place 1 drop into both eyes daily., Disp: , Rfl:   Past Medical History: Past Medical History:  Diagnosis Date  . Arthritis   . History of kidney stones   . Hypothyroidism   . PONV (postoperative nausea and vomiting)     Tobacco Use: Social History   Tobacco Use  Smoking Status Never Smoker  Smokeless Tobacco Never Used    Labs: Recent Review Scientist, physiological    Labs for ITP Cardiac and Pulmonary Rehab Latest Ref Rng & Units 11/04/2020   Cholestrol 0 - 200 mg/dL 150   LDLCALC 0 - 99 mg/dL 81   HDL >40 mg/dL 60   Trlycerides <150 mg/dL 45       Exercise Target Goals: Exercise Program Goal: Individual exercise prescription set using results from initial 6 min walk test and THRR while considering  patient's activity barriers and safety.   Exercise Prescription Goal: Initial exercise prescription builds to 30-45 minutes a day of aerobic activity, 2-3 days per week.  Home exercise guidelines will be given to patient during program as part of exercise prescription that  the participant will acknowledge.   Education: Aerobic Exercise: - Group verbal and visual presentation on the components of exercise prescription. Introduces F.I.T.T principle from ACSM for exercise prescriptions.  Reviews F.I.T.T. principles of aerobic exercise including progression. Written material given at graduation.   Education: Resistance Exercise: - Group verbal and visual presentation on the components of exercise prescription. Introduces F.I.T.T principle from ACSM for exercise prescriptions  Reviews F.I.T.T. principles of resistance exercise including progression. Written  material given at graduation.    Education: Exercise & Equipment Safety: - Individual verbal instruction and demonstration of equipment use and safety with use of the equipment. Flowsheet Row Cardiac Rehab from 01/02/2021 in [facility] Cardiac and Pulmonary Rehab  Date 01/02/21  Educator AS  Instruction Review Code 1- Verbalizes Understanding      Education: Exercise Physiology & General Exercise Guidelines: - Group verbal and written instruction with models to review the exercise physiology of the cardiovascular system and associated critical values. Provides general exercise guidelines with specific guidelines to those with heart or lung disease.    Education: Flexibility, Balance, Mind/Body Relaxation: - Group verbal and visual presentation with interactive activity on the components of exercise prescription. Introduces F.I.T.T principle from ACSM for exercise prescriptions. Reviews F.I.T.T. principles of flexibility and balance exercise training including progression. Also discusses the mind body connection.  Reviews various relaxation techniques to help reduce and manage stress (i.e. Deep breathing, progressive muscle relaxation, and visualization). Balance handout provided to take home. Written material given at graduation.   Activity Barriers & Risk Stratification:  Activity Barriers & Cardiac Risk Stratification - 12/25/20 1403      Activity Barriers & Cardiac Risk Stratification   Activity Barriers Left Knee Replacement;Right Knee Replacement;Other (comment);Assistive Device;Balance Concerns    Comments both shoulders are "bad"    Cardiac Risk Stratification Moderate           6 Minute Walk:  6 Minute Walk    Row Name 01/02/21 1545         6 Minute Walk   Phase Initial     Distance 700 feet     Walk Time 6 minutes     # of Rest Breaks 0     MPH 1.3     METS 0.73     RPE 11     Perceived Dyspnea  1     VO2 Peak 2.57     Resting HR 54 bpm     Resting BP 106/58      Resting Oxygen Saturation  99 %     Exercise Oxygen Saturation  during 6 min walk 98 %     Max Ex. HR 103 bpm     Max Ex. BP 128/60     2 Minute Post BP 104/58            Oxygen Initial Assessment:   Oxygen Re-Evaluation:   Oxygen Discharge (Final Oxygen Re-Evaluation):   Initial Exercise Prescription:  Initial Exercise Prescription - 01/02/21 1500      Date of Initial Exercise RX and Referring Provider   Date 01/02/21    Referring Provider [name]      Treadmill   MPH 1    Grade 0    Minutes 15    METs 1.77      Recumbant Bike   Level 1    RPM 60    Minutes 15    METs 1      NuStep   Level 1    SPM 80  Minutes 15    METs 1      REL-XR   Level 1    Speed 50    Minutes 15    METs 1      Prescription Details   Frequency (times per week) 2    Duration Progress to 30 minutes of continuous aerobic without signs/symptoms of physical distress      Intensity   THRR 40-80% of Max Heartrate 87-119    Ratings of Perceived Exertion 11-13    Perceived Dyspnea 0-4      Resistance Training   Training Prescription Yes    Weight 2 lb    Reps 10-15           Perform Capillary Blood Glucose checks as needed.  Exercise Prescription Changes:  Exercise Prescription Changes    Row Name 01/02/21 1500             Response to Exercise   Blood Pressure (Admit) 106/58       Blood Pressure (Exercise) 128/60       Blood Pressure (Exit) 104/58       Heart Rate (Admit) 54 bpm       Heart Rate (Exercise) 103 bpm       Heart Rate (Exit) 54 bpm       Oxygen Saturation (Admit) 98 %       Oxygen Saturation (Exercise) 99 %       Rating of Perceived Exertion (Exercise) 11       Perceived Dyspnea (Exercise) 1       Symptoms none              Exercise Comments:   Exercise Goals and Review:  Exercise Goals    Row Name 01/02/21 1548             Exercise Goals   Increase Physical Activity Yes       Intervention Provide advice, education, support and  counseling about physical activity/exercise needs.;Develop an individualized exercise prescription for aerobic and resistive training based on initial evaluation findings, risk stratification, comorbidities and participant's personal goals.       Expected Outcomes Short Term: Attend rehab on a regular basis to increase amount of physical activity.;Long Term: Add in home exercise to make exercise part of routine and to increase amount of physical activity.;Long Term: Exercising regularly at least 3-5 days a week.       Increase Strength and Stamina Yes       Intervention Provide advice, education, support and counseling about physical activity/exercise needs.;Develop an individualized exercise prescription for aerobic and resistive training based on initial evaluation findings, risk stratification, comorbidities and participant's personal goals.       Expected Outcomes Short Term: Increase workloads from initial exercise prescription for resistance, speed, and METs.;Long Term: Improve cardiorespiratory fitness, muscular endurance and strength as measured by increased METs and functional capacity (6MWT);Short Term: Perform resistance training exercises routinely during rehab and add in resistance training at home       Able to understand and use rate of perceived exertion (RPE) scale Yes       Intervention Provide education and explanation on how to use RPE scale       Expected Outcomes Short Term: Able to use RPE daily in rehab to express subjective intensity level;Long Term:  Able to use RPE to guide intensity level when exercising independently       Able to understand and use Dyspnea scale Yes  Intervention Provide education and explanation on how to use Dyspnea scale       Expected Outcomes Short Term: Able to use Dyspnea scale daily in rehab to express subjective sense of shortness of breath during exertion;Long Term: Able to use Dyspnea scale to guide intensity level when exercising independently        Knowledge and understanding of Target Heart Rate Range (THRR) Yes       Intervention Provide education and explanation of THRR including how the numbers were predicted and where they are located for reference       Expected Outcomes Short Term: Able to state/look up THRR;Short Term: Able to use daily as guideline for intensity in rehab;Long Term: Able to use THRR to govern intensity when exercising independently       Able to check pulse independently Yes       Intervention Provide education and demonstration on how to check pulse in carotid and radial arteries.;Review the importance of being able to check your own pulse for safety during independent exercise       Expected Outcomes Short Term: Able to explain why pulse checking is important during independent exercise;Long Term: Able to check pulse independently and accurately       Understanding of Exercise Prescription Yes       Intervention Provide education, explanation, and written materials on patient's individual exercise prescription       Expected Outcomes Short Term: Able to explain program exercise prescription;Long Term: Able to explain home exercise prescription to exercise independently              Exercise Goals Re-Evaluation :   Discharge Exercise Prescription (Final Exercise Prescription Changes):  Exercise Prescription Changes - 01/02/21 1500      Response to Exercise   Blood Pressure (Admit) 106/58    Blood Pressure (Exercise) 128/60    Blood Pressure (Exit) 104/58    Heart Rate (Admit) 54 bpm    Heart Rate (Exercise) 103 bpm    Heart Rate (Exit) 54 bpm    Oxygen Saturation (Admit) 98 %    Oxygen Saturation (Exercise) 99 %    Rating of Perceived Exertion (Exercise) 11    Perceived Dyspnea (Exercise) 1    Symptoms none           Nutrition:  Target Goals: Understanding of nutrition guidelines, daily intake of sodium 1500mg , cholesterol 200mg , calories 30% from fat and 7% or less from saturated fats,  daily to have 5 or more servings of fruits and vegetables.  Education: All About Nutrition: -Group instruction provided by verbal, written material, interactive activities, discussions, models, and posters to present general guidelines for heart healthy nutrition including fat, fiber, MyPlate, the role of sodium in heart healthy nutrition, utilization of the nutrition label, and utilization of this knowledge for meal planning. Follow up email sent as well. Written material given at graduation.   Biometrics:  Pre Biometrics - 01/02/21 1548      Pre Biometrics   Height 5' 3.25" (1.607 m)    Weight 188 lb 1.6 oz (85.3 kg)    BMI (Calculated) 33.04    Single Leg Stand 12.19 seconds            Nutrition Therapy Plan and Nutrition Goals:   Nutrition Assessments:  MEDIFICTS Score Key:  ?70 Need to make dietary changes   40-70 Heart Healthy Diet  ? 40 Therapeutic Level Cholesterol Diet  Flowsheet Row Cardiac Rehab from 01/02/2021 in [facility] Cardiac and Pulmonary  Rehab  Picture Your Plate Total Score on Admission 57     Picture Your Plate Scores:  <48 Unhealthy dietary pattern with much room for improvement.  41-50 Dietary pattern unlikely to meet recommendations for good health and room for improvement.  51-60 More healthful dietary pattern, with some room for improvement.   >60 Healthy dietary pattern, although there may be some specific behaviors that could be improved.    Nutrition Goals Re-Evaluation:   Nutrition Goals Discharge (Final Nutrition Goals Re-Evaluation):   Psychosocial: Target Goals: Acknowledge presence or absence of significant depression and/or stress, maximize coping skills, provide positive support system. Participant is able to verbalize types and ability to use techniques and skills needed for reducing stress and depression.   Education: Stress, Anxiety, and Depression - Group verbal and visual presentation to define topics covered.  Reviews how  body is impacted by stress, anxiety, and depression.  Also discusses healthy ways to reduce stress and to treat/manage anxiety and depression.  Written material given at graduation.   Education: Sleep Hygiene -Provides group verbal and written instruction about how sleep can affect your health.  Define sleep hygiene, discuss sleep cycles and impact of sleep habits. Review good sleep hygiene tips.    Initial Review & Psychosocial Screening:  Initial Psych Review & Screening - 12/25/20 1408      Initial Review   Current issues with Current Stress Concerns    Source of Stress Concerns Unable to perform yard/household activities      [place]? Yes   son     Barriers   Psychosocial barriers to participate in program There are no identifiable barriers or psychosocial needs.;The patient should benefit from training in stress management and relaxation.      Screening Interventions   Interventions Provide feedback about the scores to participant;Encouraged to exercise;To provide support and resources with identified psychosocial needs    Expected Outcomes Short Term goal: Utilizing psychosocial counselor, staff and physician to assist with identification of specific Stressors or current issues interfering with healing process. Setting desired goal for each stressor or current issue identified.;Long Term Goal: Stressors or current issues are controlled or eliminated.;Short Term goal: Identification and review with participant of any Quality of Life or Depression concerns found by scoring the questionnaire.;Long Term goal: The participant improves quality of Life and PHQ9 Scores as seen by post scores and/or verbalization of changes           Quality of Life Scores:   Quality of Life - 01/02/21 1553      Quality of Life   Select Quality of Life      Quality of Life Scores   Health/Function Pre 27.21 %    Socioeconomic Pre 30 %    Psych/Spiritual Pre 30 %     Family Pre 30 %    GLOBAL Pre 28.74 %          Scores of 19 and below usually indicate a poorer quality of life in these areas.  A difference of  2-3 points is a clinically meaningful difference.  A difference of 2-3 points in the total score of the Quality of Life Index has been associated with significant improvement in overall quality of life, self-image, physical symptoms, and general health in studies assessing change in quality of life.  PHQ-9: Recent Review Flowsheet Data    Depression screen [facility] 2/9 01/02/2021   Decreased Interest 0   Down, Depressed, Hopeless 0  PHQ - 2 Score 0   Altered sleeping 0   Tired, decreased energy 2   Change in appetite 0   Feeling bad or failure about yourself  0   Trouble concentrating 0   Moving slowly or fidgety/restless 0   Suicidal thoughts 0   PHQ-9 Score 2   Difficult doing work/chores Not difficult at all     Interpretation of Total Score  Total Score Depression Severity:  1-4 = Minimal depression, 5-9 = Mild depression, 10-14 = Moderate depression, 15-19 = Moderately severe depression, 20-27 = Severe depression   Psychosocial Evaluation and Intervention:  Psychosocial Evaluation - 12/25/20 1420      Psychosocial Evaluation & Interventions   Interventions Encouraged to exercise with the program and follow exercise prescription    Comments Ms. Minehart is feeling well post NSTEMI. She said each day is better than the last. Her biggest goal/stressor is yardwork. She wants to make sure she can mow the yard so she can help her son and she thoroughly enjoys yardwork. She wants to make sure she is strong enough to continue to do so. Her balance sometimes gets in the way so she is encouraged to work hard in [place] to strengthen her stamina. She does not report any issues with depression, anxiety, or sleep.    Expected Outcomes Short: attend cardiac rehab for education and exercise. Long: develop and maintain positive self care habits     Continue Psychosocial Services  Follow up required by staff           Psychosocial Re-Evaluation:   Psychosocial Discharge (Final Psychosocial Re-Evaluation):   Vocational Rehabilitation: Provide vocational rehab assistance to qualifying candidates.   Vocational Rehab Evaluation & Intervention:  Vocational Rehab - 12/25/20 1408      Initial Vocational Rehab Evaluation & Intervention   Assessment shows need for Vocational Rehabilitation No           Education: Education Goals: Education classes will be provided on a variety of topics geared toward better understanding of heart health and risk factor modification. Participant will state understanding/return demonstration of topics presented as noted by education test scores.  Learning Barriers/Preferences:  Learning Barriers/Preferences - 12/25/20 1408      Learning Barriers/Preferences   Learning Barriers None    Learning Preferences None           General Cardiac Education Topics:  AED/CPR: - Group verbal and written instruction with the use of models to demonstrate the basic use of the AED with the basic ABC's of resuscitation.   Anatomy and Cardiac Procedures: - Group verbal and visual presentation and models provide information about basic cardiac anatomy and function. Reviews the testing methods done to diagnose heart disease and the outcomes of the test results. Describes the treatment choices: Medical Management, Angioplasty, or Coronary Bypass Surgery for treating various heart conditions including Myocardial Infarction, Angina, Valve Disease, and Cardiac Arrhythmias.  Written material given at graduation.   Medication Safety: - Group verbal and visual instruction to review commonly prescribed medications for heart and lung disease. Reviews the medication, class of the drug, and side effects. Includes the steps to properly store meds and maintain the prescription regimen.  Written material given at  graduation.   Intimacy: - Group verbal instruction through game format to discuss how heart and lung disease can affect sexual intimacy. Written material given at graduation..   Know Your Numbers and Heart Failure: - Group verbal and visual instruction to discuss disease risk factors  for cardiac and pulmonary disease and treatment options.  Reviews associated critical values for Overweight/Obesity, Hypertension, Cholesterol, and Diabetes.  Discusses basics of heart failure: signs/symptoms and treatments.  Introduces Heart Failure Zone chart for action plan for heart failure.  Written material given at graduation.   Infection Prevention: - Provides verbal and written material to individual with discussion of infection control including proper hand washing and proper equipment cleaning during exercise session. Flowsheet Row Cardiac Rehab from 01/02/2021 in [facility] Cardiac and Pulmonary Rehab  Date 01/02/21  Educator AS  Instruction Review Code 1- Verbalizes Understanding      Falls Prevention: - Provides verbal and written material to individual with discussion of falls prevention and safety. Flowsheet Row Cardiac Rehab from 01/02/2021 in [facility] Cardiac and Pulmonary Rehab  Date 01/02/21  Educator AS  Instruction Review Code 1- Verbalizes Understanding      Other: -Provides group and verbal instruction on various topics (see comments)   Knowledge Questionnaire Score:  Knowledge Questionnaire Score - 01/02/21 1552      Knowledge Questionnaire Score   Pre Score 21/26 exercise nutrition           Core Components/Risk Factors/Patient Goals at Admission:  Personal Goals and Risk Factors at Admission - 01/02/21 1549      Core Components/Risk Factors/Patient Goals on Admission    Weight Management Yes    Intervention Weight Management: Develop a combined nutrition and exercise program designed to reach desired caloric intake, while maintaining appropriate intake of nutrient and fiber,  sodium and fats, and appropriate energy expenditure required for the weight goal.;Weight Management: Provide education and appropriate resources to help participant work on and attain dietary goals.    Admit Weight 188 lb 1.6 oz (85.3 kg)    Goal Weight: Short Term 185 lb (83.9 kg)    Goal Weight: Long Term 185 lb (83.9 kg)    Expected Outcomes Weight Maintenance: Understanding of the daily nutrition guidelines, which includes 25-35% calories from fat, 7% or less cal from saturated fats, less than 200mg  cholesterol, less than 1.5gm of sodium, & 5 or more servings of fruits and vegetables daily;Understanding recommendations for meals to include 15-35% energy as protein, 25-35% energy from fat, 35-60% energy from carbohydrates, less than 200mg  of dietary cholesterol, 20-35 gm of total fiber daily;Understanding of distribution of calorie intake throughout the day with the consumption of 4-5 meals/snacks    Hypertension Yes    Intervention Provide education on lifestyle modifcations including regular physical activity/exercise, weight management, moderate sodium restriction and increased consumption of fresh fruit, vegetables, and low fat dairy, alcohol moderation, and smoking cessation.;Monitor prescription use compliance.    Expected Outcomes Short Term: Continued assessment and intervention until BP is < 140/10mm HG in hypertensive participants. < 130/11mm HG in hypertensive participants with diabetes, heart failure or chronic kidney disease.;Long Term: Maintenance of blood pressure at goal levels.    Lipids Yes    Intervention Provide education and support for participant on nutrition & aerobic/resistive exercise along with prescribed medications to achieve LDL 70mg , HDL >40mg .           Education:Diabetes - Individual verbal and written instruction to review signs/symptoms of diabetes, desired ranges of glucose level fasting, after meals and with exercise. Acknowledge that pre and post exercise  glucose checks will be done for 3 sessions at entry of program.   Core Components/Risk Factors/Patient Goals Review:    Core Components/Risk Factors/Patient Goals at Discharge (Final Review):    ITP Comments:  ITP  Comments    Row Name 12/25/20 1403           ITP Comments Initial telephone orientation completed. Diagnosis can be found in [facility] 1/9. EP orientation scheduled for Thursday 3/10 at 2pm.              Comments: initial ITP

## 2021-01-03 ENCOUNTER — Other Ambulatory Visit: Payer: Self-pay | Admitting: Family Medicine

## 2021-01-03 DIAGNOSIS — Z1231 Encounter for screening mammogram for malignant neoplasm of breast: Secondary | ICD-10-CM

## 2021-01-07 ENCOUNTER — Other Ambulatory Visit: Payer: Self-pay

## 2021-01-07 DIAGNOSIS — I214 Non-ST elevation (NSTEMI) myocardial infarction: Secondary | ICD-10-CM

## 2021-01-07 DIAGNOSIS — I241 Dressler's syndrome: Secondary | ICD-10-CM | POA: Diagnosis not present

## 2021-01-07 NOTE — Progress Notes (Signed)
Daily Session Note  Patient Details  Name: Maria Trujillo MRN: [id number] Date of Birth: 06/11/1934 Referring Provider:   Flowsheet Row Cardiac Rehab from 01/02/2021 in [facility] Cardiac and Pulmonary Rehab  Referring Provider [name]      Encounter Date: 01/07/2021  Check In:  Session Check In - 01/07/21 0939      Check-In   Supervising physician immediately available to respond to emergencies See telemetry face sheet for immediately available ER MD    Location [facility]-Cardiac & Pulmonary Rehab    Staff Present Birdie Sons, MPA, Elveria Rising, BA, ACSM CEP, Exercise Physiologist;Kara Eliezer Bottom, MS Exercise Physiologist    Virtual Visit No    Medication changes reported     No    Fall or balance concerns reported    No    Warm-up and Cool-down Performed on first and last piece of equipment    Resistance Training Performed Yes    VAD Patient? No    PAD/SET Patient? No      Pain Assessment   Currently in Pain? No/denies              Social History   Tobacco Use  Smoking Status Never Smoker  Smokeless Tobacco Never Used    Goals Met:  Independence with exercise equipment Exercise tolerated well No report of cardiac concerns or symptoms Strength training completed today  Goals Unmet:  Not Applicable  Comments: First full day of exercise!  Patient was oriented to gym and equipment including functions, settings, policies, and procedures.  Patient's individual exercise prescription and treatment plan were reviewed.  All starting workloads were established based on the results of the 6 minute walk test done at initial orientation visit.  The plan for exercise progression was also introduced and progression will be customized based on patient's performance and goals.    Dr. Emily Filbert is Medical Director for [place] and [facility].

## 2021-01-08 ENCOUNTER — Other Ambulatory Visit: Payer: Self-pay

## 2021-01-08 DIAGNOSIS — I214 Non-ST elevation (NSTEMI) myocardial infarction: Secondary | ICD-10-CM

## 2021-01-08 NOTE — Progress Notes (Signed)
Completed initial RD evaluation 

## 2021-01-09 ENCOUNTER — Other Ambulatory Visit: Payer: Self-pay

## 2021-01-09 DIAGNOSIS — I241 Dressler's syndrome: Secondary | ICD-10-CM | POA: Diagnosis not present

## 2021-01-09 DIAGNOSIS — I214 Non-ST elevation (NSTEMI) myocardial infarction: Secondary | ICD-10-CM

## 2021-01-09 NOTE — Progress Notes (Signed)
Daily Session Note  Patient Details  Name: Maria Trujillo MRN: [id number] Date of Birth: [birth year] Referring Provider:   Flowsheet Row Cardiac Rehab from 01/02/2021 in [facility] Cardiac and Pulmonary Rehab  Referring Provider [name]      Encounter Date: 01/09/2021  Check In:  Session Check In - 01/09/21 0956      Check-In   Supervising physician immediately available to respond to emergencies See telemetry face sheet for immediately available ER MD    Location [facility]-Cardiac & Pulmonary Rehab    Staff Present Birdie Sons, MPA, RN;Melissa Caiola RDN, Rowe Pavy, BA, ACSM CEP, Exercise Physiologist    Virtual Visit No    Medication changes reported     No    Fall or balance concerns reported    No    Warm-up and Cool-down Performed on first and last piece of equipment    Resistance Training Performed Yes    VAD Patient? No    PAD/SET Patient? No      Pain Assessment   Currently in Pain? No/denies              Social History   Tobacco Use  Smoking Status Never Smoker  Smokeless Tobacco Never Used    Goals Met:  Independence with exercise equipment Exercise tolerated well No report of cardiac concerns or symptoms Strength training completed today  Goals Unmet:  Not Applicable  Comments: Pt able to follow exercise prescription today without complaint.  Will continue to monitor for progression.    Dr. Emily Filbert is Medical Director for [place] and [facility].

## 2021-01-14 ENCOUNTER — Other Ambulatory Visit: Payer: Self-pay

## 2021-01-14 DIAGNOSIS — I214 Non-ST elevation (NSTEMI) myocardial infarction: Secondary | ICD-10-CM

## 2021-01-14 DIAGNOSIS — I241 Dressler's syndrome: Secondary | ICD-10-CM | POA: Diagnosis not present

## 2021-01-14 NOTE — Progress Notes (Signed)
Daily Session Note  Patient Details  Name: Maria Trujillo MRN: [id number] Date of Birth: 1933/11/19 Referring Provider:   Flowsheet Row Cardiac Rehab from 01/02/2021 in [facility] Cardiac and Pulmonary Rehab  Referring Provider [name]      Encounter Date: 01/14/2021  Check In:  Session Check In - 01/14/21 1004      Check-In   Supervising physician immediately available to respond to emergencies See telemetry face sheet for immediately available ER MD    Location [facility]-Cardiac & Pulmonary Rehab    Staff Present Birdie Sons, MPA, Elveria Rising, BA, ACSM CEP, Exercise Physiologist;Kara Eliezer Bottom, MS Exercise Physiologist    Virtual Visit No    Medication changes reported     No    Fall or balance concerns reported    No    Warm-up and Cool-down Performed on first and last piece of equipment    Resistance Training Performed Yes    VAD Patient? No    PAD/SET Patient? No      Pain Assessment   Currently in Pain? No/denies              Social History   Tobacco Use  Smoking Status Never Smoker  Smokeless Tobacco Never Used    Goals Met:  Independence with exercise equipment Exercise tolerated well No report of cardiac concerns or symptoms Strength training completed today  Goals Unmet:  Not Applicable  Comments: Pt able to follow exercise prescription today without complaint.  Will continue to monitor for progression.    Dr. Emily Filbert is Medical Director for [place] and [facility].

## 2021-01-15 ENCOUNTER — Encounter: Payer: Self-pay | Admitting: *Deleted

## 2021-01-15 DIAGNOSIS — I214 Non-ST elevation (NSTEMI) myocardial infarction: Secondary | ICD-10-CM

## 2021-01-15 NOTE — Progress Notes (Signed)
Cardiac Individual Treatment Plan  Patient Details  Name: Maria Trujillo MRN: [id number] Date of Birth: [birth year] Referring Provider:   Flowsheet Row Cardiac Rehab from 01/02/2021 in [facility] Cardiac and Pulmonary Rehab  Referring Provider [name]      Initial Encounter Date:  Flowsheet Row Cardiac Rehab from 01/02/2021 in [facility] Cardiac and Pulmonary Rehab  Date 01/02/21      Visit Diagnosis: NSTEMI (non-ST elevated myocardial infarction) [facility])  Patient's Home Medications on Admission:  Current Outpatient Medications:  .  acetaminophen (TYLENOL) 650 MG CR tablet, Take 650 mg by mouth daily as needed for pain., Disp: , Rfl:  .  aspirin 81 MG tablet, Take 81 mg by mouth at bedtime., Disp: , Rfl:  .  atorvastatin (LIPITOR) 80 MG tablet, Take 1 tablet (80 mg total) by mouth at bedtime., Disp: 30 tablet, Rfl: 2 .  bimatoprost (LUMIGAN) 0.03 % ophthalmic solution, Place 1 drop into both eyes at bedtime., Disp: , Rfl:  .  Calcium Carb-Cholecalciferol (CALCIUM CARBONATE-VITAMIN D3) 600-400 MG-UNIT TABS, Take 1 tablet by mouth daily., Disp: , Rfl:  .  cholecalciferol (VITAMIN D) 1000 units tablet, Take 1,000 Units by mouth daily., Disp: , Rfl:  .  clopidogrel (PLAVIX) 75 MG tablet, Take 1 tablet (75 mg total) by mouth daily with breakfast., Disp: 30 tablet, Rfl: 2 .  fexofenadine (ALLEGRA) 180 MG tablet, Take 180 mg by mouth daily as needed for allergies or rhinitis., Disp: , Rfl:  .  isosorbide mononitrate (IMDUR) 30 MG 24 hr tablet, Take 1 tablet (30 mg total) by mouth daily., Disp: 30 tablet, Rfl: 2 .  levothyroxine (SYNTHROID, LEVOTHROID) 100 MCG tablet, Take 100 mcg by mouth daily before breakfast., Disp: , Rfl:  .  lisinopril (ZESTRIL) 5 MG tablet, Take 1 tablet (5 mg total) by mouth daily., Disp: 30 tablet, Rfl: 2 .  metoprolol tartrate (LOPRESSOR) 25 MG tablet, Take 0.5 tablets (12.5 mg total) by mouth 2 (two) times daily., Disp: 30 tablet, Rfl: 2 .  Multiple Vitamin (MULTIVITAMIN WITH  MINERALS) TABS tablet, Take 1 tablet by mouth daily., Disp: , Rfl:  .  Multiple Vitamins-Minerals (PRESERVISION AREDS 2 PO), Take 2 tablets by mouth 2 (two) times daily., Disp: , Rfl:  .  Omega-3 Fatty Acids (FISH OIL OMEGA-3) 1000 MG CAPS, Take 1,000 mg by mouth daily., Disp: , Rfl:  .  timolol (TIMOPTIC) 0.5 % ophthalmic solution, Place 1 drop into both eyes daily., Disp: , Rfl:   Past Medical History: Past Medical History:  Diagnosis Date  . Arthritis   . History of kidney stones   . Hypothyroidism   . PONV (postoperative nausea and vomiting)     Tobacco Use: Social History   Tobacco Use  Smoking Status Never Smoker  Smokeless Tobacco Never Used    Labs: Recent Review Scientist, physiological    Labs for ITP Cardiac and Pulmonary Rehab Latest Ref Rng & Units 11/04/2020   Cholestrol 0 - 200 mg/dL 150   LDLCALC 0 - 99 mg/dL 81   HDL >40 mg/dL 60   Trlycerides <150 mg/dL 45       Exercise Target Goals: Exercise Program Goal: Individual exercise prescription set using results from initial 6 min walk test and THRR while considering  patient's activity barriers and safety.   Exercise Prescription Goal: Initial exercise prescription builds to 30-45 minutes a day of aerobic activity, 2-3 days per week.  Home exercise guidelines will be given to patient during program as part of exercise prescription that  the participant will acknowledge.   Education: Aerobic Exercise: - Group verbal and visual presentation on the components of exercise prescription. Introduces F.I.T.T principle from ACSM for exercise prescriptions.  Reviews F.I.T.T. principles of aerobic exercise including progression. Written material given at graduation.   Education: Resistance Exercise: - Group verbal and visual presentation on the components of exercise prescription. Introduces F.I.T.T principle from ACSM for exercise prescriptions  Reviews F.I.T.T. principles of resistance exercise including progression. Written  material given at graduation.    Education: Exercise & Equipment Safety: - Individual verbal instruction and demonstration of equipment use and safety with use of the equipment. Flowsheet Row Cardiac Rehab from 01/09/2021 in [facility] Cardiac and Pulmonary Rehab  Date 01/02/21  Educator AS  Instruction Review Code 1- Verbalizes Understanding      Education: Exercise Physiology & General Exercise Guidelines: - Group verbal and written instruction with models to review the exercise physiology of the cardiovascular system and associated critical values. Provides general exercise guidelines with specific guidelines to those with heart or lung disease.    Education: Flexibility, Balance, Mind/Body Relaxation: - Group verbal and visual presentation with interactive activity on the components of exercise prescription. Introduces F.I.T.T principle from ACSM for exercise prescriptions. Reviews F.I.T.T. principles of flexibility and balance exercise training including progression. Also discusses the mind body connection.  Reviews various relaxation techniques to help reduce and manage stress (i.e. Deep breathing, progressive muscle relaxation, and visualization). Balance handout provided to take home. Written material given at graduation.   Activity Barriers & Risk Stratification:  Activity Barriers & Cardiac Risk Stratification - 12/25/20 1403      Activity Barriers & Cardiac Risk Stratification   Activity Barriers Left Knee Replacement;Right Knee Replacement;Other (comment);Assistive Device;Balance Concerns    Comments both shoulders are "bad"    Cardiac Risk Stratification Moderate           6 Minute Walk:  6 Minute Walk    Row Name 01/02/21 1545         6 Minute Walk   Phase Initial     Distance 700 feet     Walk Time 6 minutes     # of Rest Breaks 0     MPH 1.3     METS 0.73     RPE 11     Perceived Dyspnea  1     VO2 Peak 2.57     Resting HR 54 bpm     Resting BP 106/58      Resting Oxygen Saturation  99 %     Exercise Oxygen Saturation  during 6 min walk 98 %     Max Ex. HR 103 bpm     Max Ex. BP 128/60     2 Minute Post BP 104/58            Oxygen Initial Assessment:   Oxygen Re-Evaluation:   Oxygen Discharge (Final Oxygen Re-Evaluation):   Initial Exercise Prescription:  Initial Exercise Prescription - 01/02/21 1500      Date of Initial Exercise RX and Referring Provider   Date 01/02/21    Referring Provider [name]      Treadmill   MPH 1    Grade 0    Minutes 15    METs 1.77      Recumbant Bike   Level 1    RPM 60    Minutes 15    METs 1      NuStep   Level 1    SPM 80  Minutes 15    METs 1      REL-XR   Level 1    Speed 50    Minutes 15    METs 1      Prescription Details   Frequency (times per week) 2    Duration Progress to 30 minutes of continuous aerobic without signs/symptoms of physical distress      Intensity   THRR 40-80% of Max Heartrate 87-119    Ratings of Perceived Exertion 11-13    Perceived Dyspnea 0-4      Resistance Training   Training Prescription Yes    Weight 2 lb    Reps 10-15           Perform Capillary Blood Glucose checks as needed.  Exercise Prescription Changes:  Exercise Prescription Changes    Row Name 01/02/21 1500             Response to Exercise   Blood Pressure (Admit) 106/58       Blood Pressure (Exercise) 128/60       Blood Pressure (Exit) 104/58       Heart Rate (Admit) 54 bpm       Heart Rate (Exercise) 103 bpm       Heart Rate (Exit) 54 bpm       Oxygen Saturation (Admit) 98 %       Oxygen Saturation (Exercise) 99 %       Rating of Perceived Exertion (Exercise) 11       Perceived Dyspnea (Exercise) 1       Symptoms none              Exercise Comments:  Exercise Comments    Row Name 01/07/21 0940           Exercise Comments First full day of exercise!  Patient was oriented to gym and equipment including functions, settings, policies, and  procedures.  Patient's individual exercise prescription and treatment plan were reviewed.  All starting workloads were established based on the results of the 6 minute walk test done at initial orientation visit.  The plan for exercise progression was also introduced and progression will be customized based on patient's performance and goals.              Exercise Goals and Review:  Exercise Goals    Row Name 01/02/21 1548             Exercise Goals   Increase Physical Activity Yes       Intervention Provide advice, education, support and counseling about physical activity/exercise needs.;Develop an individualized exercise prescription for aerobic and resistive training based on initial evaluation findings, risk stratification, comorbidities and participant's personal goals.       Expected Outcomes Short Term: Attend rehab on a regular basis to increase amount of physical activity.;Long Term: Add in home exercise to make exercise part of routine and to increase amount of physical activity.;Long Term: Exercising regularly at least 3-5 days a week.       Increase Strength and Stamina Yes       Intervention Provide advice, education, support and counseling about physical activity/exercise needs.;Develop an individualized exercise prescription for aerobic and resistive training based on initial evaluation findings, risk stratification, comorbidities and participant's personal goals.       Expected Outcomes Short Term: Increase workloads from initial exercise prescription for resistance, speed, and METs.;Long Term: Improve cardiorespiratory fitness, muscular endurance and strength as measured by increased METs and functional capacity (6MWT);Short  Term: Perform resistance training exercises routinely during rehab and add in resistance training at home       Able to understand and use rate of perceived exertion (RPE) scale Yes       Intervention Provide education and explanation on how to use RPE  scale       Expected Outcomes Short Term: Able to use RPE daily in rehab to express subjective intensity level;Long Term:  Able to use RPE to guide intensity level when exercising independently       Able to understand and use Dyspnea scale Yes       Intervention Provide education and explanation on how to use Dyspnea scale       Expected Outcomes Short Term: Able to use Dyspnea scale daily in rehab to express subjective sense of shortness of breath during exertion;Long Term: Able to use Dyspnea scale to guide intensity level when exercising independently       Knowledge and understanding of Target Heart Rate Range (THRR) Yes       Intervention Provide education and explanation of THRR including how the numbers were predicted and where they are located for reference       Expected Outcomes Short Term: Able to state/look up THRR;Short Term: Able to use daily as guideline for intensity in rehab;Long Term: Able to use THRR to govern intensity when exercising independently       Able to check pulse independently Yes       Intervention Provide education and demonstration on how to check pulse in carotid and radial arteries.;Review the importance of being able to check your own pulse for safety during independent exercise       Expected Outcomes Short Term: Able to explain why pulse checking is important during independent exercise;Long Term: Able to check pulse independently and accurately       Understanding of Exercise Prescription Yes       Intervention Provide education, explanation, and written materials on patient's individual exercise prescription       Expected Outcomes Short Term: Able to explain program exercise prescription;Long Term: Able to explain home exercise prescription to exercise independently              Exercise Goals Re-Evaluation :  Exercise Goals Re-Evaluation    Row Name 01/07/21 0940             Exercise Goal Re-Evaluation   Exercise Goals Review Increase Physical  Activity;Able to understand and use rate of perceived exertion (RPE) scale;Knowledge and understanding of Target Heart Rate Range (THRR);Understanding of Exercise Prescription;Increase Strength and Stamina;Able to understand and use Dyspnea scale;Able to check pulse independently       Comments Reviewed RPE and dyspnea scales, THR and program prescription with pt today.  Pt voiced understanding and was given a copy of goals to take home.       Expected Outcomes Short: Use RPE daily to regulate intensity. Long: Follow program prescription in THR.              Discharge Exercise Prescription (Final Exercise Prescription Changes):  Exercise Prescription Changes - 01/02/21 1500      Response to Exercise   Blood Pressure (Admit) 106/58    Blood Pressure (Exercise) 128/60    Blood Pressure (Exit) 104/58    Heart Rate (Admit) 54 bpm    Heart Rate (Exercise) 103 bpm    Heart Rate (Exit) 54 bpm    Oxygen Saturation (Admit) 98 %    Oxygen  Saturation (Exercise) 99 %    Rating of Perceived Exertion (Exercise) 11    Perceived Dyspnea (Exercise) 1    Symptoms none           Nutrition:  Target Goals: Understanding of nutrition guidelines, daily intake of sodium 1500mg , cholesterol 200mg , calories 30% from fat and 7% or less from saturated fats, daily to have 5 or more servings of fruits and vegetables.  Education: All About Nutrition: -Group instruction provided by verbal, written material, interactive activities, discussions, models, and posters to present general guidelines for heart healthy nutrition including fat, fiber, MyPlate, the role of sodium in heart healthy nutrition, utilization of the nutrition label, and utilization of this knowledge for meal planning. Follow up email sent as well. Written material given at graduation.   Biometrics:  Pre Biometrics - 01/02/21 1548      Pre Biometrics   Height 5' 3.25" (1.607 m)    Weight 188 lb 1.6 oz (85.3 kg)    BMI (Calculated) 33.04     Single Leg Stand 12.19 seconds            Nutrition Therapy Plan and Nutrition Goals:  Nutrition Therapy & Goals - 01/08/21 1433      Nutrition Therapy   Diet Heart healthy, low Na    Drug/Food Interactions Statins/Certain Fruits    Protein (specify units) 65g    Fiber 25 grams    Whole Grain Foods 3 servings    Saturated Fats 12 max. grams    Fruits and Vegetables 5 servings/day    Sodium 1.5 grams      Personal Nutrition Goals   Nutrition Goal ST: try whole wheat bread LT: continue with heart healthy changes    Comments She reports making changes for years before her heart event due to her husband having high blood pressure. She reports not frying and not using much grease, using lean meats. B: bowl of cereal (honeynut cheerios or raisin bran or toasted oats - one time a week will have sausage and egg. Will have coffee (black), she will also have a small glass of orange juice. L: sandwich with [place] or ham with some lettuce and mayo and mustard (white wheat or honey wheat) or some peanut butter crackers with ritz crackers and activia yogurt and tea. S: cookie or hershey candy. She enjoys oranges, grapes, and bananas. D: chicken tenders or boneless ribeye porkchop on george foreman grill with salad. She also enjoys pinto beans and turnip greens. Discussed heart healthy eating.      Intervention Plan   Intervention Prescribe, educate and counsel regarding individualized specific dietary modifications aiming towards targeted core components such as weight, hypertension, lipid management, diabetes, heart failure and other comorbidities.;Nutrition handout(s) given to patient.    Expected Outcomes Short Term Goal: Understand basic principles of dietary content, such as calories, fat, sodium, cholesterol and nutrients.;Short Term Goal: A plan has been developed with personal nutrition goals set during dietitian appointment.;Long Term Goal: Adherence to prescribed nutrition plan.            Nutrition Assessments:  MEDIFICTS Score Key:  ?70 Need to make dietary changes   40-70 Heart Healthy Diet  ? 40 Therapeutic Level Cholesterol Diet  Flowsheet Row Cardiac Rehab from 01/02/2021 in [facility] Cardiac and Pulmonary Rehab  Picture Your Plate Total Score on Admission 57     Picture Your Plate Scores:  <30 Unhealthy dietary pattern with much room for improvement.  41-50 Dietary pattern unlikely to meet recommendations  for good health and room for improvement.  51-60 More healthful dietary pattern, with some room for improvement.   >60 Healthy dietary pattern, although there may be some specific behaviors that could be improved.    Nutrition Goals Re-Evaluation:   Nutrition Goals Discharge (Final Nutrition Goals Re-Evaluation):   Psychosocial: Target Goals: Acknowledge presence or absence of significant depression and/or stress, maximize coping skills, provide positive support system. Participant is able to verbalize types and ability to use techniques and skills needed for reducing stress and depression.   Education: Stress, Anxiety, and Depression - Group verbal and visual presentation to define topics covered.  Reviews how body is impacted by stress, anxiety, and depression.  Also discusses healthy ways to reduce stress and to treat/manage anxiety and depression.  Written material given at graduation.   Education: Sleep Hygiene -Provides group verbal and written instruction about how sleep can affect your health.  Define sleep hygiene, discuss sleep cycles and impact of sleep habits. Review good sleep hygiene tips.    Initial Review & Psychosocial Screening:  Initial Psych Review & Screening - 12/25/20 1408      Initial Review   Current issues with Current Stress Concerns    Source of Stress Concerns Unable to perform yard/household activities      [place]? Yes   son     Barriers   Psychosocial barriers to participate in  program There are no identifiable barriers or psychosocial needs.;The patient should benefit from training in stress management and relaxation.      Screening Interventions   Interventions Provide feedback about the scores to participant;Encouraged to exercise;To provide support and resources with identified psychosocial needs    Expected Outcomes Short Term goal: Utilizing psychosocial counselor, staff and physician to assist with identification of specific Stressors or current issues interfering with healing process. Setting desired goal for each stressor or current issue identified.;Long Term Goal: Stressors or current issues are controlled or eliminated.;Short Term goal: Identification and review with participant of any Quality of Life or Depression concerns found by scoring the questionnaire.;Long Term goal: The participant improves quality of Life and PHQ9 Scores as seen by post scores and/or verbalization of changes           Quality of Life Scores:   Quality of Life - 01/02/21 1553      Quality of Life   Select Quality of Life      Quality of Life Scores   Health/Function Pre 27.21 %    Socioeconomic Pre 30 %    Psych/Spiritual Pre 30 %    Family Pre 30 %    GLOBAL Pre 28.74 %          Scores of 19 and below usually indicate a poorer quality of life in these areas.  A difference of  2-3 points is a clinically meaningful difference.  A difference of 2-3 points in the total score of the Quality of Life Index has been associated with significant improvement in overall quality of life, self-image, physical symptoms, and general health in studies assessing change in quality of life.  PHQ-9: Recent Review Flowsheet Data    Depression screen [facility] 2/9 01/02/2021   Decreased Interest 0   Down, Depressed, Hopeless 0   PHQ - 2 Score 0   Altered sleeping 0   Tired, decreased energy 2   Change in appetite 0   Feeling bad or failure about yourself  0   Trouble concentrating 0  Moving  slowly or fidgety/restless 0   Suicidal thoughts 0   PHQ-9 Score 2   Difficult doing work/chores Not difficult at all     Interpretation of Total Score  Total Score Depression Severity:  1-4 = Minimal depression, 5-9 = Mild depression, 10-14 = Moderate depression, 15-19 = Moderately severe depression, 20-27 = Severe depression   Psychosocial Evaluation and Intervention:  Psychosocial Evaluation - 12/25/20 1420      Psychosocial Evaluation & Interventions   Interventions Encouraged to exercise with the program and follow exercise prescription    Comments Ms. Vanpelt is feeling well post NSTEMI. She said each day is better than the last. Her biggest goal/stressor is yardwork. She wants to make sure she can mow the yard so she can help her son and she thoroughly enjoys yardwork. She wants to make sure she is strong enough to continue to do so. Her balance sometimes gets in the way so she is encouraged to work hard in [place] to strengthen her stamina. She does not report any issues with depression, anxiety, or sleep.    Expected Outcomes Short: attend cardiac rehab for education and exercise. Long: develop and maintain positive self care habits    Continue Psychosocial Services  Follow up required by staff           Psychosocial Re-Evaluation:   Psychosocial Discharge (Final Psychosocial Re-Evaluation):   Vocational Rehabilitation: Provide vocational rehab assistance to qualifying candidates.   Vocational Rehab Evaluation & Intervention:  Vocational Rehab - 12/25/20 1408      Initial Vocational Rehab Evaluation & Intervention   Assessment shows need for Vocational Rehabilitation No           Education: Education Goals: Education classes will be provided on a variety of topics geared toward better understanding of heart health and risk factor modification. Participant will state understanding/return demonstration of topics presented as noted by education test  scores.  Learning Barriers/Preferences:  Learning Barriers/Preferences - 12/25/20 1408      Learning Barriers/Preferences   Learning Barriers None    Learning Preferences None           General Cardiac Education Topics:  AED/CPR: - Group verbal and written instruction with the use of models to demonstrate the basic use of the AED with the basic ABC's of resuscitation.   Anatomy and Cardiac Procedures: - Group verbal and visual presentation and models provide information about basic cardiac anatomy and function. Reviews the testing methods done to diagnose heart disease and the outcomes of the test results. Describes the treatment choices: Medical Management, Angioplasty, or Coronary Bypass Surgery for treating various heart conditions including Myocardial Infarction, Angina, Valve Disease, and Cardiac Arrhythmias.  Written material given at graduation.   Medication Safety: - Group verbal and visual instruction to review commonly prescribed medications for heart and lung disease. Reviews the medication, class of the drug, and side effects. Includes the steps to properly store meds and maintain the prescription regimen.  Written material given at graduation. Flowsheet Row Cardiac Rehab from 01/09/2021 in [facility] Cardiac and Pulmonary Rehab  Date 01/09/21  Educator [facility]  Instruction Review Code 1- Verbalizes Understanding      Intimacy: - Group verbal instruction through game format to discuss how heart and lung disease can affect sexual intimacy. Written material given at graduation..   Know Your Numbers and Heart Failure: - Group verbal and visual instruction to discuss disease risk factors for cardiac and pulmonary disease and treatment options.  Reviews associated  critical values for Overweight/Obesity, Hypertension, Cholesterol, and Diabetes.  Discusses basics of heart failure: signs/symptoms and treatments.  Introduces Heart Failure Zone chart for action plan for heart failure.   Written material given at graduation.   Infection Prevention: - Provides verbal and written material to individual with discussion of infection control including proper hand washing and proper equipment cleaning during exercise session. Flowsheet Row Cardiac Rehab from 01/09/2021 in [facility] Cardiac and Pulmonary Rehab  Date 01/02/21  Educator AS  Instruction Review Code 1- Verbalizes Understanding      Falls Prevention: - Provides verbal and written material to individual with discussion of falls prevention and safety. Flowsheet Row Cardiac Rehab from 01/09/2021 in [facility] Cardiac and Pulmonary Rehab  Date 01/02/21  Educator AS  Instruction Review Code 1- Verbalizes Understanding      Other: -Provides group and verbal instruction on various topics (see comments)   Knowledge Questionnaire Score:  Knowledge Questionnaire Score - 01/02/21 1552      Knowledge Questionnaire Score   Pre Score 21/26 exercise nutrition           Core Components/Risk Factors/Patient Goals at Admission:  Personal Goals and Risk Factors at Admission - 01/02/21 1549      Core Components/Risk Factors/Patient Goals on Admission    Weight Management Yes    Intervention Weight Management: Develop a combined nutrition and exercise program designed to reach desired caloric intake, while maintaining appropriate intake of nutrient and fiber, sodium and fats, and appropriate energy expenditure required for the weight goal.;Weight Management: Provide education and appropriate resources to help participant work on and attain dietary goals.    Admit Weight 188 lb 1.6 oz (85.3 kg)    Goal Weight: Short Term 185 lb (83.9 kg)    Goal Weight: Long Term 185 lb (83.9 kg)    Expected Outcomes Weight Maintenance: Understanding of the daily nutrition guidelines, which includes 25-35% calories from fat, 7% or less cal from saturated fats, less than 200mg  cholesterol, less than 1.5gm of sodium, & 5 or more servings of fruits and  vegetables daily;Understanding recommendations for meals to include 15-35% energy as protein, 25-35% energy from fat, 35-60% energy from carbohydrates, less than 200mg  of dietary cholesterol, 20-35 gm of total fiber daily;Understanding of distribution of calorie intake throughout the day with the consumption of 4-5 meals/snacks    Hypertension Yes    Intervention Provide education on lifestyle modifcations including regular physical activity/exercise, weight management, moderate sodium restriction and increased consumption of fresh fruit, vegetables, and low fat dairy, alcohol moderation, and smoking cessation.;Monitor prescription use compliance.    Expected Outcomes Short Term: Continued assessment and intervention until BP is < 140/54mm HG in hypertensive participants. < 130/41mm HG in hypertensive participants with diabetes, heart failure or chronic kidney disease.;Long Term: Maintenance of blood pressure at goal levels.    Lipids Yes    Intervention Provide education and support for participant on nutrition & aerobic/resistive exercise along with prescribed medications to achieve LDL 70mg , HDL >40mg .           Education:Diabetes - Individual verbal and written instruction to review signs/symptoms of diabetes, desired ranges of glucose level fasting, after meals and with exercise. Acknowledge that pre and post exercise glucose checks will be done for 3 sessions at entry of program.   Core Components/Risk Factors/Patient Goals Review:    Core Components/Risk Factors/Patient Goals at Discharge (Final Review):    ITP Comments:  ITP Comments    Row Name 12/25/20 1403 01/02/21 1559 01/07/21  3785 01/08/21 1501 01/15/21 1123   ITP Comments Initial telephone orientation completed. Diagnosis can be found in [facility] 1/9. EP orientation scheduled for Thursday 3/10 at 2pm. Completed 6MWT and gym orientation. Initial ITP created and sent for review to Dr. Emily Filbert, Medical Director. First full day of  exercise!  Patient was oriented to gym and equipment including functions, settings, policies, and procedures.  Patient's individual exercise prescription and treatment plan were reviewed.  All starting workloads were established based on the results of the 6 minute walk test done at initial orientation visit.  The plan for exercise progression was also introduced and progression will be customized based on patient's performance and goals. Completed initial RD evaluation 30 Day review completed. Medical Director ITP review done, changes made as directed, and signed approval by Medical Director.          Comments:

## 2021-01-16 ENCOUNTER — Other Ambulatory Visit: Payer: Self-pay

## 2021-01-16 DIAGNOSIS — I241 Dressler's syndrome: Secondary | ICD-10-CM | POA: Diagnosis not present

## 2021-01-16 DIAGNOSIS — I214 Non-ST elevation (NSTEMI) myocardial infarction: Secondary | ICD-10-CM

## 2021-01-16 NOTE — Progress Notes (Signed)
Daily Session Note  Patient Details  Name: Maria Trujillo MRN: [id number] Date of Birth: 1934-08-19 Referring Provider:   Flowsheet Row Cardiac Rehab from 01/02/2021 in [facility] Cardiac and Pulmonary Rehab  Referring Provider [name]      Encounter Date: 01/16/2021  Check In:  Session Check In - 01/16/21 0943      Check-In   Supervising physician immediately available to respond to emergencies See telemetry face sheet for immediately available ER MD    Location [facility]-Cardiac & Pulmonary Rehab    Staff Present Birdie Sons, MPA, RN;Melissa Caiola RDN, Rowe Pavy, BA, ACSM CEP, Exercise Physiologist    Virtual Visit No    Medication changes reported     No    Fall or balance concerns reported    No    Warm-up and Cool-down Performed on first and last piece of equipment    Resistance Training Performed Yes    VAD Patient? No    PAD/SET Patient? No      Pain Assessment   Currently in Pain? No/denies              Social History   Tobacco Use  Smoking Status Never Smoker  Smokeless Tobacco Never Used    Goals Met:  Independence with exercise equipment Exercise tolerated well No report of cardiac concerns or symptoms Strength training completed today  Goals Unmet:  Not Applicable  Comments: Pt able to follow exercise prescription today without complaint.  Will continue to monitor for progression.    Dr. Emily Filbert is Medical Director for [place] and [facility].

## 2021-01-21 ENCOUNTER — Other Ambulatory Visit: Payer: Self-pay

## 2021-01-21 DIAGNOSIS — I241 Dressler's syndrome: Secondary | ICD-10-CM | POA: Diagnosis not present

## 2021-01-21 DIAGNOSIS — I214 Non-ST elevation (NSTEMI) myocardial infarction: Secondary | ICD-10-CM

## 2021-01-21 NOTE — Progress Notes (Signed)
Daily Session Note  Patient Details  Name: Maria Trujillo MRN: [id number] Date of Birth: [birth year] Referring Provider:   Flowsheet Row Cardiac Rehab from 01/02/2021 in [facility] Cardiac and Pulmonary Rehab  Referring Provider [name]      Encounter Date: 01/21/2021  Check In:  Session Check In - 01/21/21 [place]      Check-In   Supervising physician immediately available to respond to emergencies See telemetry face sheet for immediately available ER MD    Location [facility]-Cardiac & Pulmonary Rehab    Staff Present Maria Trujillo, MPA, Maria Trujillo, BA, ACSM CEP, Exercise Physiologist;Maria Trujillo RCP,RRT,BSRT    Virtual Visit No    Medication changes reported     No    Fall or balance concerns reported    No    Warm-up and Cool-down Performed on first and last piece of equipment    Resistance Training Performed Yes    VAD Patient? No    PAD/SET Patient? No      Pain Assessment   Currently in Pain? No/denies              Social History   Tobacco Use  Smoking Status Never Smoker  Smokeless Tobacco Never Used    Goals Met:  Independence with exercise equipment Exercise tolerated well No report of cardiac concerns or symptoms Strength training completed today  Goals Unmet:  Not Applicable  Comments: Pt able to follow exercise prescription today without complaint.  Will continue to monitor for progression.    Dr. Emily Filbert is Medical Director for [place] and [facility].

## 2021-01-23 ENCOUNTER — Other Ambulatory Visit: Payer: Self-pay

## 2021-01-23 DIAGNOSIS — I2510 Atherosclerotic heart disease of native coronary artery without angina pectoris: Secondary | ICD-10-CM | POA: Diagnosis not present

## 2021-01-23 DIAGNOSIS — I214 Non-ST elevation (NSTEMI) myocardial infarction: Secondary | ICD-10-CM

## 2021-01-23 DIAGNOSIS — I10 Essential (primary) hypertension: Secondary | ICD-10-CM | POA: Diagnosis not present

## 2021-01-23 DIAGNOSIS — E7800 Pure hypercholesterolemia, unspecified: Secondary | ICD-10-CM | POA: Diagnosis not present

## 2021-01-23 DIAGNOSIS — I241 Dressler's syndrome: Secondary | ICD-10-CM | POA: Diagnosis not present

## 2021-01-23 DIAGNOSIS — R002 Palpitations: Secondary | ICD-10-CM | POA: Diagnosis not present

## 2021-01-23 DIAGNOSIS — I491 Atrial premature depolarization: Secondary | ICD-10-CM | POA: Diagnosis not present

## 2021-01-23 NOTE — Progress Notes (Signed)
Daily Session Note  Patient Details  Name: Maria Trujillo MRN: [id number] Date of Birth: 01/12/1934 Referring Provider:   Flowsheet Row Cardiac Rehab from 01/02/2021 in [facility] Cardiac and Pulmonary Rehab  Referring Provider [name]      Encounter Date: 01/23/2021  Check In:  Session Check In - 01/23/21 1026      Check-In   Supervising physician immediately available to respond to emergencies See telemetry face sheet for immediately available ER MD    Location [facility]-Cardiac & Pulmonary Rehab    Staff Present Kelly Bollinger, MPA, RN;Colleen Bailey, RN, BSN;Amanda Sommer, BA, ACSM CEP, Exercise Physiologist    Virtual Visit No    Medication changes reported     No    Fall or balance concerns reported    No    Warm-up and Cool-down Performed on first and last piece of equipment    Resistance Training Performed Yes    VAD Patient? No    PAD/SET Patient? No      Pain Assessment   Currently in Pain? No/denies              Social History   Tobacco Use  Smoking Status Never Smoker  Smokeless Tobacco Never Used    Goals Met:  Independence with exercise equipment Exercise tolerated well No report of cardiac concerns or symptoms Strength training completed today  Goals Unmet:  Not Applicable  Comments: Pt able to follow exercise prescription today without complaint.  Will continue to monitor for progression.    Dr. Mark Miller is Medical Director for [facility] and [facility]. 

## 2021-01-28 ENCOUNTER — Encounter: Payer: PPO | Attending: Cardiology

## 2021-01-28 ENCOUNTER — Other Ambulatory Visit: Payer: Self-pay

## 2021-01-28 DIAGNOSIS — I214 Non-ST elevation (NSTEMI) myocardial infarction: Secondary | ICD-10-CM | POA: Diagnosis not present

## 2021-01-28 NOTE — Progress Notes (Signed)
Daily Session Note  Patient Details  Name: CRISSY MCCREADIE MRN: [id number] Date of Birth: April 05, 1934 Referring Provider:   Flowsheet Row Cardiac Rehab from 01/02/2021 in [facility] Cardiac and Pulmonary Rehab  Referring Provider [name]      Encounter Date: 01/28/2021  Check In:  Session Check In - 01/28/21 1009      Check-In   Supervising physician immediately available to respond to emergencies See telemetry face sheet for immediately available ER MD    Location [facility]-Cardiac & Pulmonary Rehab    Staff Present Birdie Sons, MPA, Elveria Rising, BA, ACSM CEP, Exercise Physiologist;Kara Eliezer Bottom, MS Exercise Physiologist;Joseph Tessie Fass RCP,RRT,BSRT    Virtual Visit No    Medication changes reported     No    Fall or balance concerns reported    No    Warm-up and Cool-down Performed on first and last piece of equipment    Resistance Training Performed Yes    VAD Patient? No    PAD/SET Patient? No      Pain Assessment   Currently in Pain? No/denies              Social History   Tobacco Use  Smoking Status Never Smoker  Smokeless Tobacco Never Used    Goals Met:  Independence with exercise equipment Exercise tolerated well No report of cardiac concerns or symptoms Strength training completed today  Goals Unmet:  Not Applicable  Comments: Pt able to follow exercise prescription today without complaint.  Will continue to monitor for progression.    Dr. Emily Filbert is Medical Director for [place] and [facility].

## 2021-01-29 ENCOUNTER — Ambulatory Visit
Admission: RE | Admit: 2021-01-29 | Discharge: 2021-01-29 | Disposition: A | Discharge status: Home / Self Care | Payer: PPO | Source: Ambulatory Visit | Attending: Family Medicine | Admitting: Family Medicine

## 2021-01-29 ENCOUNTER — Other Ambulatory Visit: Payer: Self-pay

## 2021-01-29 DIAGNOSIS — Z1231 Encounter for screening mammogram for malignant neoplasm of breast: Secondary | ICD-10-CM | POA: Diagnosis not present

## 2021-01-30 ENCOUNTER — Other Ambulatory Visit: Payer: Self-pay

## 2021-01-30 DIAGNOSIS — I214 Non-ST elevation (NSTEMI) myocardial infarction: Secondary | ICD-10-CM

## 2021-01-30 NOTE — Progress Notes (Signed)
Daily Session Note  Patient Details  Name: HAEVEN NICKLE MRN: [id number] Date of Birth: 1933/10/29 Referring Provider:   Flowsheet Row Cardiac Rehab from 01/02/2021 in [facility] Cardiac and Pulmonary Rehab  Referring Provider [name]      Encounter Date: 01/30/2021  Check In:  Session Check In - 01/30/21 0956      Check-In   Supervising physician immediately available to respond to emergencies See telemetry face sheet for immediately available ER MD    Location [facility]-Cardiac & Pulmonary Rehab    Staff Present Hope Budds RDN, Luther Redo, MPA, Elveria Rising, BA, ACSM CEP, Exercise Physiologist    Virtual Visit No    Medication changes reported     No    Fall or balance concerns reported    No    Warm-up and Cool-down Performed on first and last piece of equipment    Resistance Training Performed Yes    VAD Patient? No    PAD/SET Patient? No      Pain Assessment   Currently in Pain? No/denies              Social History   Tobacco Use  Smoking Status Never Smoker  Smokeless Tobacco Never Used    Goals Met:  Independence with exercise equipment Exercise tolerated well No report of cardiac concerns or symptoms Strength training completed today  Goals Unmet:  Not Applicable  Comments: Pt able to follow exercise prescription today without complaint.  Will continue to monitor for progression.    Dr. Emily Filbert is Medical Director for [place] and [facility].

## 2021-02-04 ENCOUNTER — Other Ambulatory Visit: Payer: Self-pay

## 2021-02-04 DIAGNOSIS — I214 Non-ST elevation (NSTEMI) myocardial infarction: Secondary | ICD-10-CM

## 2021-02-04 NOTE — Progress Notes (Signed)
Daily Session Note  Patient Details  Name: Maria Trujillo MRN: [id number] Date of Birth: 01-28-1934 Referring Provider:   Flowsheet Row Cardiac Rehab from 01/02/2021 in [facility] Cardiac and Pulmonary Rehab  Referring Provider [name]      Encounter Date: 02/04/2021  Check In:  Session Check In - 02/04/21 0944      Check-In   Supervising physician immediately available to respond to emergencies See telemetry face sheet for immediately available ER MD    Location [facility]-Cardiac & Pulmonary Rehab    Staff Present Birdie Sons, MPA, Elveria Rising, BA, ACSM CEP, Exercise Physiologist;Kara Eliezer Bottom, MS Exercise Physiologist    Virtual Visit No    Medication changes reported     No    Fall or balance concerns reported    No    Warm-up and Cool-down Performed on first and last piece of equipment    Resistance Training Performed Yes    VAD Patient? No    PAD/SET Patient? No      Pain Assessment   Currently in Pain? No/denies              Social History   Tobacco Use  Smoking Status Never Smoker  Smokeless Tobacco Never Used    Goals Met:  Independence with exercise equipment Exercise tolerated well No report of cardiac concerns or symptoms Strength training completed today  Goals Unmet:  Not Applicable  Comments: Pt able to follow exercise prescription today without complaint.  Will continue to monitor for progression.    Dr. Emily Filbert is Medical Director for [place] and [facility].

## 2021-02-11 DIAGNOSIS — Z96653 Presence of artificial knee joint, bilateral: Secondary | ICD-10-CM | POA: Diagnosis not present

## 2021-02-12 ENCOUNTER — Encounter: Payer: Self-pay | Admitting: *Deleted

## 2021-02-12 DIAGNOSIS — I214 Non-ST elevation (NSTEMI) myocardial infarction: Secondary | ICD-10-CM

## 2021-02-12 NOTE — Progress Notes (Signed)
Cardiac Individual Treatment Plan  Patient Details  Name: Maria Trujillo MRN: [id number] Date of Birth: Mar 09, 1934 Referring Provider:   Flowsheet Row Cardiac Rehab from 01/02/2021 in [facility] Cardiac and Pulmonary Rehab  Referring Provider [name]      Initial Encounter Date:  Flowsheet Row Cardiac Rehab from 01/02/2021 in [facility] Cardiac and Pulmonary Rehab  Date 01/02/21      Visit Diagnosis: NSTEMI (non-ST elevated myocardial infarction) [facility])  Patient's Home Medications on Admission:  Current Outpatient Medications:  .  acetaminophen (TYLENOL) 650 MG CR tablet, Take 650 mg by mouth daily as needed for pain., Disp: , Rfl:  .  aspirin 81 MG tablet, Take 81 mg by mouth at bedtime., Disp: , Rfl:  .  atorvastatin (LIPITOR) 80 MG tablet, Take 1 tablet (80 mg total) by mouth at bedtime., Disp: 30 tablet, Rfl: 2 .  bimatoprost (LUMIGAN) 0.03 % ophthalmic solution, Place 1 drop into both eyes at bedtime., Disp: , Rfl:  .  Calcium Carb-Cholecalciferol (CALCIUM CARBONATE-VITAMIN D3) 600-400 MG-UNIT TABS, Take 1 tablet by mouth daily., Disp: , Rfl:  .  cholecalciferol (VITAMIN D) 1000 units tablet, Take 1,000 Units by mouth daily., Disp: , Rfl:  .  fexofenadine (ALLEGRA) 180 MG tablet, Take 180 mg by mouth daily as needed for allergies or rhinitis., Disp: , Rfl:  .  isosorbide mononitrate (IMDUR) 30 MG 24 hr tablet, Take 1 tablet (30 mg total) by mouth daily., Disp: 30 tablet, Rfl: 2 .  levothyroxine (SYNTHROID, LEVOTHROID) 100 MCG tablet, Take 100 mcg by mouth daily before breakfast., Disp: , Rfl:  .  lisinopril (ZESTRIL) 5 MG tablet, Take 1 tablet (5 mg total) by mouth daily., Disp: 30 tablet, Rfl: 2 .  metoprolol tartrate (LOPRESSOR) 25 MG tablet, Take 0.5 tablets (12.5 mg total) by mouth 2 (two) times daily., Disp: 30 tablet, Rfl: 2 .  Multiple Vitamin (MULTIVITAMIN WITH MINERALS) TABS tablet, Take 1 tablet by mouth daily., Disp: , Rfl:  .  Multiple Vitamins-Minerals (PRESERVISION AREDS 2 PO),  Take 2 tablets by mouth 2 (two) times daily., Disp: , Rfl:  .  Omega-3 Fatty Acids (FISH OIL OMEGA-3) 1000 MG CAPS, Take 1,000 mg by mouth daily., Disp: , Rfl:  .  timolol (TIMOPTIC) 0.5 % ophthalmic solution, Place 1 drop into both eyes daily., Disp: , Rfl:   Past Medical History: Past Medical History:  Diagnosis Date  . Arthritis   . History of kidney stones   . Hypothyroidism   . PONV (postoperative nausea and vomiting)     Tobacco Use: Social History   Tobacco Use  Smoking Status Never Smoker  Smokeless Tobacco Never Used    Labs: Recent Review Scientist, physiological    Labs for ITP Cardiac and Pulmonary Rehab Latest Ref Rng & Units 11/04/2020   Cholestrol 0 - 200 mg/dL 150   LDLCALC 0 - 99 mg/dL 81   HDL >40 mg/dL 60   Trlycerides <150 mg/dL 45       Exercise Target Goals: Exercise Program Goal: Individual exercise prescription set using results from initial 6 min walk test and THRR while considering  patient's activity barriers and safety.   Exercise Prescription Goal: Initial exercise prescription builds to 30-45 minutes a day of aerobic activity, 2-3 days per week.  Home exercise guidelines will be given to patient during program as part of exercise prescription that the participant will acknowledge.   Education: Aerobic Exercise: - Group verbal and visual presentation on the components of exercise prescription. Introduces F.I.T.T  principle from ACSM for exercise prescriptions.  Reviews F.I.T.T. principles of aerobic exercise including progression. Written material given at graduation.   Education: Resistance Exercise: - Group verbal and visual presentation on the components of exercise prescription. Introduces F.I.T.T principle from ACSM for exercise prescriptions  Reviews F.I.T.T. principles of resistance exercise including progression. Written material given at graduation.    Education: Exercise & Equipment Safety: - Individual verbal instruction and demonstration  of equipment use and safety with use of the equipment. Flowsheet Row Cardiac Rehab from 01/30/2021 in [facility] Cardiac and Pulmonary Rehab  Date 01/02/21  Educator AS  Instruction Review Code 1- Verbalizes Understanding      Education: Exercise Physiology & General Exercise Guidelines: - Group verbal and written instruction with models to review the exercise physiology of the cardiovascular system and associated critical values. Provides general exercise guidelines with specific guidelines to those with heart or lung disease.    Education: Flexibility, Balance, Mind/Body Relaxation: - Group verbal and visual presentation with interactive activity on the components of exercise prescription. Introduces F.I.T.T principle from ACSM for exercise prescriptions. Reviews F.I.T.T. principles of flexibility and balance exercise training including progression. Also discusses the mind body connection.  Reviews various relaxation techniques to help reduce and manage stress (i.e. Deep breathing, progressive muscle relaxation, and visualization). Balance handout provided to take home. Written material given at graduation.   Activity Barriers & Risk Stratification:  Activity Barriers & Cardiac Risk Stratification - 12/25/20 1403      Activity Barriers & Cardiac Risk Stratification   Activity Barriers Left Knee Replacement;Right Knee Replacement;Other (comment);Assistive Device;Balance Concerns    Comments both shoulders are "bad"    Cardiac Risk Stratification Moderate           6 Minute Walk:  6 Minute Walk    Row Name 01/02/21 1545         6 Minute Walk   Phase Initial     Distance 700 feet     Walk Time 6 minutes     # of Rest Breaks 0     MPH 1.3     METS 0.73     RPE 11     Perceived Dyspnea  1     VO2 Peak 2.57     Resting HR 54 bpm     Resting BP 106/58     Resting Oxygen Saturation  99 %     Exercise Oxygen Saturation  during 6 min walk 98 %     Max Ex. HR 103 bpm     Max Ex. BP  128/60     2 Minute Post BP 104/58            Oxygen Initial Assessment:   Oxygen Re-Evaluation:   Oxygen Discharge (Final Oxygen Re-Evaluation):   Initial Exercise Prescription:  Initial Exercise Prescription - 01/02/21 1500      Date of Initial Exercise RX and Referring Provider   Date 01/02/21    Referring Provider [name]      Treadmill   MPH 1    Grade 0    Minutes 15    METs 1.77      Recumbant Bike   Level 1    RPM 60    Minutes 15    METs 1      NuStep   Level 1    SPM 80    Minutes 15    METs 1      REL-XR   Level 1  Speed 50    Minutes 15    METs 1      Prescription Details   Frequency (times per week) 2    Duration Progress to 30 minutes of continuous aerobic without signs/symptoms of physical distress      Intensity   THRR 40-80% of Max Heartrate 87-119    Ratings of Perceived Exertion 11-13    Perceived Dyspnea 0-4      Resistance Training   Training Prescription Yes    Weight 2 lb    Reps 10-15           Perform Capillary Blood Glucose checks as needed.  Exercise Prescription Changes:  Exercise Prescription Changes    Row Name 01/02/21 1500 01/21/21 1200 02/03/21 1300         Response to Exercise   Blood Pressure (Admit) 106/58 132/54 142/72     Blood Pressure (Exercise) 128/60 122/60 130/70     Blood Pressure (Exit) 104/58 114/60 120/62     Heart Rate (Admit) 54 bpm 51 bpm 67 bpm     Heart Rate (Exercise) 103 bpm 76 bpm 69 bpm     Heart Rate (Exit) 54 bpm 72 bpm 52 bpm     Oxygen Saturation (Admit) 98 % -- --     Oxygen Saturation (Exercise) 99 % -- --     Rating of Perceived Exertion (Exercise) 11 12 13      Perceived Dyspnea (Exercise) 1 -- --     Symptoms none none none     Duration -- Continue with 30 min of aerobic exercise without signs/symptoms of physical distress. Continue with 30 min of aerobic exercise without signs/symptoms of physical distress.     Intensity -- THRR unchanged THRR unchanged            Progression   Progression -- Continue to progress workloads to maintain intensity without signs/symptoms of physical distress. Continue to progress workloads to maintain intensity without signs/symptoms of physical distress.     Average METs -- 2 2           Resistance Training   Training Prescription -- Yes Yes     Weight -- 2 lb 2 lb     Reps -- 10-15 10-15           Interval Training   Interval Training -- -- No           NuStep   Level -- 1 3     Minutes -- 30 30     METs -- 2 2.2  highest reported           Home Exercise Plan   Plans to continue exercise at -- -- Home (comment)  walking     Frequency -- -- Add 2 additional days to program exercise sessions.     Initial Home Exercises Provided -- -- 01/30/21            Exercise Comments:  Exercise Comments    Row Name 01/07/21 0940           Exercise Comments First full day of exercise!  Patient was oriented to gym and equipment including functions, settings, policies, and procedures.  Patient's individual exercise prescription and treatment plan were reviewed.  All starting workloads were established based on the results of the 6 minute walk test done at initial orientation visit.  The plan for exercise progression was also introduced and progression will be customized based on patient's performance and goals.  Exercise Goals and Review:  Exercise Goals    Row Name 01/02/21 1548             Exercise Goals   Increase Physical Activity Yes       Intervention Provide advice, education, support and counseling about physical activity/exercise needs.;Develop an individualized exercise prescription for aerobic and resistive training based on initial evaluation findings, risk stratification, comorbidities and participant's personal goals.       Expected Outcomes Short Term: Attend rehab on a regular basis to increase amount of physical activity.;Long Term: Add in home exercise to make exercise part of  routine and to increase amount of physical activity.;Long Term: Exercising regularly at least 3-5 days a week.       Increase Strength and Stamina Yes       Intervention Provide advice, education, support and counseling about physical activity/exercise needs.;Develop an individualized exercise prescription for aerobic and resistive training based on initial evaluation findings, risk stratification, comorbidities and participant's personal goals.       Expected Outcomes Short Term: Increase workloads from initial exercise prescription for resistance, speed, and METs.;Long Term: Improve cardiorespiratory fitness, muscular endurance and strength as measured by increased METs and functional capacity (6MWT);Short Term: Perform resistance training exercises routinely during rehab and add in resistance training at home       Able to understand and use rate of perceived exertion (RPE) scale Yes       Intervention Provide education and explanation on how to use RPE scale       Expected Outcomes Short Term: Able to use RPE daily in rehab to express subjective intensity level;Long Term:  Able to use RPE to guide intensity level when exercising independently       Able to understand and use Dyspnea scale Yes       Intervention Provide education and explanation on how to use Dyspnea scale       Expected Outcomes Short Term: Able to use Dyspnea scale daily in rehab to express subjective sense of shortness of breath during exertion;Long Term: Able to use Dyspnea scale to guide intensity level when exercising independently       Knowledge and understanding of Target Heart Rate Range (THRR) Yes       Intervention Provide education and explanation of THRR including how the numbers were predicted and where they are located for reference       Expected Outcomes Short Term: Able to state/look up THRR;Short Term: Able to use daily as guideline for intensity in rehab;Long Term: Able to use THRR to govern intensity when  exercising independently       Able to check pulse independently Yes       Intervention Provide education and demonstration on how to check pulse in carotid and radial arteries.;Review the importance of being able to check your own pulse for safety during independent exercise       Expected Outcomes Short Term: Able to explain why pulse checking is important during independent exercise;Long Term: Able to check pulse independently and accurately       Understanding of Exercise Prescription Yes       Intervention Provide education, explanation, and written materials on patient's individual exercise prescription       Expected Outcomes Short Term: Able to explain program exercise prescription;Long Term: Able to explain home exercise prescription to exercise independently              Exercise Goals Re-Evaluation :  Exercise Goals Re-Evaluation  [place] Name 01/07/21 0940 01/21/21 1257 01/23/21 1027 01/30/21 1146 02/03/21 1356     Exercise Goal Re-Evaluation   Exercise Goals Review Increase Physical Activity;Able to understand and use rate of perceived exertion (RPE) scale;Knowledge and understanding of Target Heart Rate Range (THRR);Understanding of Exercise Prescription;Increase Strength and Stamina;Able to understand and use Dyspnea scale;Able to check pulse independently Increase Physical Activity;Increase Strength and Stamina Increase Physical Activity;Increase Strength and Stamina Increase Physical Activity;Increase Strength and Stamina Increase Physical Activity;Increase Strength and Stamina   Comments Reviewed RPE and dyspnea scales, THR and program prescription with pt today.  Pt voiced understanding and was given a copy of goals to take home. Shenicka is tolerating exercise well.  She doesnt quite reach her THR range.  Staff will review THR. Harvest does some yardwork when weather is nice. She tries to walk around the house every hour. EP will review home exercise with her. Reviewed home exercise  with pt today.  Pt plans to walk/consider joining [facility] [place] for exercise.  Reviewed THR, pulse, RPE, sign and symptoms, pulse oximetery and when to call 911 or MD.  Also discussed weather considerations and indoor options.  Pt voiced understanding. Delesia is doing well in rehab.  She really likes the NuStep.  She has done level 3 one day and level 2 another day.  We will work with her to keep her consistent and monitor progress.   Expected Outcomes Short: Use RPE daily to regulate intensity. Long: Follow program prescription in THR. Short: work towards [place] range Long:  improve overall stamina Short: continue to attend rehab consistently Long:  improve overall stamina Short: exercise 1-2 days outside program sessions Long: improve stamina Short: Keep NuStep at level 3  Long: Continue to improve stamina          Discharge Exercise Prescription (Final Exercise Prescription Changes):  Exercise Prescription Changes - 02/03/21 1300      Response to Exercise   Blood Pressure (Admit) 142/72    Blood Pressure (Exercise) 130/70    Blood Pressure (Exit) 120/62    Heart Rate (Admit) 67 bpm    Heart Rate (Exercise) 69 bpm    Heart Rate (Exit) 52 bpm    Rating of Perceived Exertion (Exercise) 13    Symptoms none    Duration Continue with 30 min of aerobic exercise without signs/symptoms of physical distress.    Intensity THRR unchanged      Progression   Progression Continue to progress workloads to maintain intensity without signs/symptoms of physical distress.    Average METs 2      Resistance Training   Training Prescription Yes    Weight 2 lb    Reps 10-15      Interval Training   Interval Training No      NuStep   Level 3    Minutes 30    METs 2.2   highest reported     Home Exercise Plan   Plans to continue exercise at Home (comment)   walking   Frequency Add 2 additional days to program exercise sessions.    Initial Home Exercises Provided 01/30/21           Nutrition:  Target  Goals: Understanding of nutrition guidelines, daily intake of sodium 1500mg , cholesterol 200mg , calories 30% from fat and 7% or less from saturated fats, daily to have 5 or more servings of fruits and vegetables.  Education: All About Nutrition: -Group instruction provided by verbal, written material, interactive activities, discussions, models, and posters  to present general guidelines for heart healthy nutrition including fat, fiber, MyPlate, the role of sodium in heart healthy nutrition, utilization of the nutrition label, and utilization of this knowledge for meal planning. Follow up email sent as well. Written material given at graduation.   Biometrics:  Pre Biometrics - 01/02/21 1548      Pre Biometrics   Height 5' 3.25" (1.607 m)    Weight 188 lb 1.6 oz (85.3 kg)    BMI (Calculated) 33.04    Single Leg Stand 12.19 seconds            Nutrition Therapy Plan and Nutrition Goals:  Nutrition Therapy & Goals - 01/08/21 1433      Nutrition Therapy   Diet Heart healthy, low Na    Drug/Food Interactions Statins/Certain Fruits    Protein (specify units) 65g    Fiber 25 grams    Whole Grain Foods 3 servings    Saturated Fats 12 max. grams    Fruits and Vegetables 5 servings/day    Sodium 1.5 grams      Personal Nutrition Goals   Nutrition Goal ST: try whole wheat bread LT: continue with heart healthy changes    Comments She reports making changes for years before her heart event due to her husband having high blood pressure. She reports not frying and not using much grease, using lean meats. B: bowl of cereal (honeynut cheerios or raisin bran or toasted oats - one time a week will have sausage and egg. Will have coffee (black), she will also have a small glass of orange juice. L: sandwich with [place] or ham with some lettuce and mayo and mustard (white wheat or honey wheat) or some peanut butter crackers with ritz crackers and activia yogurt and tea. S: cookie or hershey candy. She  enjoys oranges, grapes, and bananas. D: chicken tenders or boneless ribeye porkchop on george foreman grill with salad. She also enjoys pinto beans and turnip greens. Discussed heart healthy eating.      Intervention Plan   Intervention Prescribe, educate and counsel regarding individualized specific dietary modifications aiming towards targeted core components such as weight, hypertension, lipid management, diabetes, heart failure and other comorbidities.;Nutrition handout(s) given to patient.    Expected Outcomes Short Term Goal: Understand basic principles of dietary content, such as calories, fat, sodium, cholesterol and nutrients.;Short Term Goal: A plan has been developed with personal nutrition goals set during dietitian appointment.;Long Term Goal: Adherence to prescribed nutrition plan.           Nutrition Assessments:  MEDIFICTS Score Key:  ?70 Need to make dietary changes   40-70 Heart Healthy Diet  ? 40 Therapeutic Level Cholesterol Diet  Flowsheet Row Cardiac Rehab from 01/02/2021 in [facility] Cardiac and Pulmonary Rehab  Picture Your Plate Total Score on Admission 57     Picture Your Plate Scores:  <04 Unhealthy dietary pattern with much room for improvement.  41-50 Dietary pattern unlikely to meet recommendations for good health and room for improvement.  51-60 More healthful dietary pattern, with some room for improvement.   >60 Healthy dietary pattern, although there may be some specific behaviors that could be improved.    Nutrition Goals Re-Evaluation:  Nutrition Goals Re-Evaluation    [facility] Name 01/23/21 1020             Goals   Current Weight 186 lb 11.2 oz (84.7 kg)       Nutrition Goal ST: add fruit to snack LT: continue with  heart healthy changes       Comment She has tried whole wheat bread and reports liking it more than she thought she would. She has candy for an afternoon snack and has been trying to add fruit - has tried oranges.       Expected  Outcome ST: add fruit to snack LT: continue with heart healthy changes              Nutrition Goals Discharge (Final Nutrition Goals Re-Evaluation):  Nutrition Goals Re-Evaluation - 01/23/21 1020      Goals   Current Weight 186 lb 11.2 oz (84.7 kg)    Nutrition Goal ST: add fruit to snack LT: continue with heart healthy changes    Comment She has tried whole wheat bread and reports liking it more than she thought she would. She has candy for an afternoon snack and has been trying to add fruit - has tried oranges.    Expected Outcome ST: add fruit to snack LT: continue with heart healthy changes           Psychosocial: Target Goals: Acknowledge presence or absence of significant depression and/or stress, maximize coping skills, provide positive support system. Participant is able to verbalize types and ability to use techniques and skills needed for reducing stress and depression.   Education: Stress, Anxiety, and Depression - Group verbal and visual presentation to define topics covered.  Reviews how body is impacted by stress, anxiety, and depression.  Also discusses healthy ways to reduce stress and to treat/manage anxiety and depression.  Written material given at graduation. Flowsheet Row Cardiac Rehab from 01/30/2021 in [facility] Cardiac and Pulmonary Rehab  Date 01/30/21  Educator [facility]  Instruction Review Code 1- [employer] Understanding      Education: Sleep Hygiene -Provides group verbal and written instruction about how sleep can affect your health.  Define sleep hygiene, discuss sleep cycles and impact of sleep habits. Review good sleep hygiene tips.    Initial Review & Psychosocial Screening:  Initial Psych Review & Screening - 12/25/20 1408      Initial Review   Current issues with Current Stress Concerns    Source of Stress Concerns Unable to perform yard/household activities      [place]? Yes   son     Barriers   Psychosocial barriers  to participate in program There are no identifiable barriers or psychosocial needs.;The patient should benefit from training in stress management and relaxation.      Screening Interventions   Interventions Provide feedback about the scores to participant;Encouraged to exercise;To provide support and resources with identified psychosocial needs    Expected Outcomes Short Term goal: Utilizing psychosocial counselor, staff and physician to assist with identification of specific Stressors or current issues interfering with healing process. Setting desired goal for each stressor or current issue identified.;Long Term Goal: Stressors or current issues are controlled or eliminated.;Short Term goal: Identification and review with participant of any Quality of Life or Depression concerns found by scoring the questionnaire.;Long Term goal: The participant improves quality of Life and PHQ9 Scores as seen by post scores and/or verbalization of changes           Quality of Life Scores:   Quality of Life - 01/02/21 1553      Quality of Life   Select Quality of Life      Quality of Life Scores   Health/Function Pre 27.21 %    Socioeconomic Pre 30 %  Psych/Spiritual Pre 30 %    Family Pre 30 %    GLOBAL Pre 28.74 %          Scores of 19 and below usually indicate a poorer quality of life in these areas.  A difference of  2-3 points is a clinically meaningful difference.  A difference of 2-3 points in the total score of the Quality of Life Index has been associated with significant improvement in overall quality of life, self-image, physical symptoms, and general health in studies assessing change in quality of life.  PHQ-9: Recent Review Flowsheet Data    Depression screen [facility] 2/9 01/02/2021   Decreased Interest 0   Down, Depressed, Hopeless 0   PHQ - 2 Score 0   Altered sleeping 0   Tired, decreased energy 2   Change in appetite 0   Feeling bad or failure about yourself  0   Trouble  concentrating 0   Moving slowly or fidgety/restless 0   Suicidal thoughts 0   PHQ-9 Score 2   Difficult doing work/chores Not difficult at all     Interpretation of Total Score  Total Score Depression Severity:  1-4 = Minimal depression, 5-9 = Mild depression, 10-14 = Moderate depression, 15-19 = Moderately severe depression, 20-27 = Severe depression   Psychosocial Evaluation and Intervention:  Psychosocial Evaluation - 12/25/20 1420      Psychosocial Evaluation & Interventions   Interventions Encouraged to exercise with the program and follow exercise prescription    Comments Ms. Cariker is feeling well post NSTEMI. She said each day is better than the last. Her biggest goal/stressor is yardwork. She wants to make sure she can mow the yard so she can help her son and she thoroughly enjoys yardwork. She wants to make sure she is strong enough to continue to do so. Her balance sometimes gets in the way so she is encouraged to work hard in [place] to strengthen her stamina. She does not report any issues with depression, anxiety, or sleep.    Expected Outcomes Short: attend cardiac rehab for education and exercise. Long: develop and maintain positive self care habits    Continue Psychosocial Services  Follow up required by staff           Psychosocial Re-Evaluation:  Psychosocial Re-Evaluation    [place] Name 01/23/21 1024             Psychosocial Re-Evaluation   Current issues with None Identified       Comments Pranathi reports she has been sleeping well - 1030pm-7am. She likes puzzles, read, gardening and yardwork. She has a good support sytem in her son and daughter as well as her friends. She is close to her niece who is close to her age.       Expected Outcomes ST: continue stress reducing activities LT: continue with positive attitude       Interventions Encouraged to attend Cardiac Rehabilitation for the exercise       Continue Psychosocial Services  Follow up required by  staff              Psychosocial Discharge (Final Psychosocial Re-Evaluation):  Psychosocial Re-Evaluation - 01/23/21 1024      Psychosocial Re-Evaluation   Current issues with None Identified    Comments Mikella reports she has been sleeping well - 1030pm-7am. She likes puzzles, read, gardening and yardwork. She has a good support sytem in her son and daughter as well as her friends. She is close  to her niece who is close to her age.    Expected Outcomes ST: continue stress reducing activities LT: continue with positive attitude    Interventions Encouraged to attend Cardiac Rehabilitation for the exercise    Continue Psychosocial Services  Follow up required by staff           Vocational Rehabilitation: Provide vocational rehab assistance to qualifying candidates.   Vocational Rehab Evaluation & Intervention:  Vocational Rehab - 12/25/20 1408      Initial Vocational Rehab Evaluation & Intervention   Assessment shows need for Vocational Rehabilitation No           Education: Education Goals: Education classes will be provided on a variety of topics geared toward better understanding of heart health and risk factor modification. Participant will state understanding/return demonstration of topics presented as noted by education test scores.  Learning Barriers/Preferences:  Learning Barriers/Preferences - 12/25/20 1408      Learning Barriers/Preferences   Learning Barriers None    Learning Preferences None           General Cardiac Education Topics:  AED/CPR: - Group verbal and written instruction with the use of models to demonstrate the basic use of the AED with the basic ABC's of resuscitation.   Anatomy and Cardiac Procedures: - Group verbal and visual presentation and models provide information about basic cardiac anatomy and function. Reviews the testing methods done to diagnose heart disease and the outcomes of the test results. Describes the treatment choices:  Medical Management, Angioplasty, or Coronary Bypass Surgery for treating various heart conditions including Myocardial Infarction, Angina, Valve Disease, and Cardiac Arrhythmias.  Written material given at graduation.   Medication Safety: - Group verbal and visual instruction to review commonly prescribed medications for heart and lung disease. Reviews the medication, class of the drug, and side effects. Includes the steps to properly store meds and maintain the prescription regimen.  Written material given at graduation. Flowsheet Row Cardiac Rehab from 01/30/2021 in [facility] Cardiac and Pulmonary Rehab  Date 01/09/21  Educator [facility]  Instruction Review Code 1- Verbalizes Understanding      Intimacy: - Group verbal instruction through game format to discuss how heart and lung disease can affect sexual intimacy. Written material given at graduation..   Know Your Numbers and Heart Failure: - Group verbal and visual instruction to discuss disease risk factors for cardiac and pulmonary disease and treatment options.  Reviews associated critical values for Overweight/Obesity, Hypertension, Cholesterol, and Diabetes.  Discusses basics of heart failure: signs/symptoms and treatments.  Introduces Heart Failure Zone chart for action plan for heart failure.  Written material given at graduation. Flowsheet Row Cardiac Rehab from 01/30/2021 in [facility] Cardiac and Pulmonary Rehab  Date 01/16/21  Educator SB  Instruction Review Code 1- Verbalizes Understanding      Infection Prevention: - Provides verbal and written material to individual with discussion of infection control including proper hand washing and proper equipment cleaning during exercise session. Flowsheet Row Cardiac Rehab from 01/30/2021 in [facility] Cardiac and Pulmonary Rehab  Date 01/02/21  Educator AS  Instruction Review Code 1- Verbalizes Understanding      Falls Prevention: - Provides verbal and written material to individual with discussion of  falls prevention and safety. Flowsheet Row Cardiac Rehab from 01/30/2021 in [facility] Cardiac and Pulmonary Rehab  Date 01/02/21  Educator AS  Instruction Review Code 1- Verbalizes Understanding      Other: -Provides group and verbal instruction on various topics (see comments)  Knowledge Questionnaire Score:  Knowledge Questionnaire Score - 01/02/21 1552      Knowledge Questionnaire Score   Pre Score 21/26 exercise nutrition           Core Components/Risk Factors/Patient Goals at Admission:  Personal Goals and Risk Factors at Admission - 01/02/21 1549      Core Components/Risk Factors/Patient Goals on Admission    Weight Management Yes    Intervention Weight Management: Develop a combined nutrition and exercise program designed to reach desired caloric intake, while maintaining appropriate intake of nutrient and fiber, sodium and fats, and appropriate energy expenditure required for the weight goal.;Weight Management: Provide education and appropriate resources to help participant work on and attain dietary goals.    Admit Weight 188 lb 1.6 oz (85.3 kg)    Goal Weight: Short Term 185 lb (83.9 kg)    Goal Weight: Long Term 185 lb (83.9 kg)    Expected Outcomes Weight Maintenance: Understanding of the daily nutrition guidelines, which includes 25-35% calories from fat, 7% or less cal from saturated fats, less than 200mg  cholesterol, less than 1.5gm of sodium, & 5 or more servings of fruits and vegetables daily;Understanding recommendations for meals to include 15-35% energy as protein, 25-35% energy from fat, 35-60% energy from carbohydrates, less than 200mg  of dietary cholesterol, 20-35 gm of total fiber daily;Understanding of distribution of calorie intake throughout the day with the consumption of 4-5 meals/snacks    Hypertension Yes    Intervention Provide education on lifestyle modifcations including regular physical activity/exercise, weight management, moderate sodium restriction and  increased consumption of fresh fruit, vegetables, and low fat dairy, alcohol moderation, and smoking cessation.;Monitor prescription use compliance.    Expected Outcomes Short Term: Continued assessment and intervention until BP is < 140/61mm HG in hypertensive participants. < 130/36mm HG in hypertensive participants with diabetes, heart failure or chronic kidney disease.;Long Term: Maintenance of blood pressure at goal levels.    Lipids Yes    Intervention Provide education and support for participant on nutrition & aerobic/resistive exercise along with prescribed medications to achieve LDL 70mg , HDL >40mg .           Education:Diabetes - Individual verbal and written instruction to review signs/symptoms of diabetes, desired ranges of glucose level fasting, after meals and with exercise. Acknowledge that pre and post exercise glucose checks will be done for 3 sessions at entry of program.   Core Components/Risk Factors/Patient Goals Review:   Goals and Risk Factor Review    Row Name 01/23/21 1016             Core Components/Risk Factors/Patient Goals Review   Personal Goals Review Weight Management/Obesity;Hypertension;Lipids       Review Asees reports since starting her weight has decreased about 1 pound - this morning she weighed 184.6lbs. She has a cuff, but does not check it at home. This am it was 142/70. She is still taking her medications as directed. Her LDL was 81 within normal range, but on the higher side of normal in January. She reports she has been making heart healthy diet changes for years. She gets the results of her heart monitor today.       Expected Outcomes ST: on non-rehab days - check BP at home. LT: continue to monitor risk factos.              Core Components/Risk Factors/Patient Goals at Discharge (Final Review):   Goals and Risk Factor Review - 01/23/21 1016      Core  Components/Risk Factors/Patient Goals Review   Personal Goals Review Weight  Management/Obesity;Hypertension;Lipids    Review Lavanna reports since starting her weight has decreased about 1 pound - this morning she weighed 184.6lbs. She has a cuff, but does not check it at home. This am it was 142/70. She is still taking her medications as directed. Her LDL was 81 within normal range, but on the higher side of normal in January. She reports she has been making heart healthy diet changes for years. She gets the results of her heart monitor today.    Expected Outcomes ST: on non-rehab days - check BP at home. LT: continue to monitor risk factos.           ITP Comments:  ITP Comments    Row Name 12/25/20 1403 01/02/21 1559 01/07/21 0940 01/08/21 1501 01/15/21 1123   ITP Comments Initial telephone orientation completed. Diagnosis can be found in [facility] 1/9. EP orientation scheduled for Thursday 3/10 at 2pm. Completed 6MWT and gym orientation. Initial ITP created and sent for review to Dr. Emily Filbert, Medical Director. First full day of exercise!  Patient was oriented to gym and equipment including functions, settings, policies, and procedures.  Patient's individual exercise prescription and treatment plan were reviewed.  All starting workloads were established based on the results of the 6 minute walk test done at initial orientation visit.  The plan for exercise progression was also introduced and progression will be customized based on patient's performance and goals. Completed initial RD evaluation 30 Day review completed. Medical Director ITP review done, changes made as directed, and signed approval by Medical Director.   [place] Name 02/12/21 0637           ITP Comments 30 Day review completed. Medical Director ITP review done, changes made as directed, and signed approval by Medical Director.              Comments:

## 2021-02-13 ENCOUNTER — Other Ambulatory Visit: Payer: Self-pay

## 2021-02-13 DIAGNOSIS — I214 Non-ST elevation (NSTEMI) myocardial infarction: Secondary | ICD-10-CM | POA: Diagnosis not present

## 2021-02-13 NOTE — Progress Notes (Signed)
Daily Session Note  Patient Details  Name: Maria Trujillo MRN: [id number] Date of Birth: 1934-06-30 Referring Provider:   Flowsheet Row Cardiac Rehab from 01/02/2021 in [facility] Cardiac and Pulmonary Rehab  Referring Provider [name]      Encounter Date: 02/13/2021  Check In:  Session Check In - 02/13/21 0945      Check-In   Supervising physician immediately available to respond to emergencies See telemetry face sheet for immediately available ER MD    Location [facility]-Cardiac & Pulmonary Rehab    Staff Present Birdie Sons, MPA, RN;Melissa Caiola RDN, Rowe Pavy, BA, ACSM CEP, Exercise Physiologist    Virtual Visit No    Medication changes reported     No    Fall or balance concerns reported    No    Warm-up and Cool-down Performed on first and last piece of equipment    Resistance Training Performed Yes    VAD Patient? No    PAD/SET Patient? No      Pain Assessment   Currently in Pain? No/denies              Social History   Tobacco Use  Smoking Status Never Smoker  Smokeless Tobacco Never Used    Goals Met:  Independence with exercise equipment Exercise tolerated well No report of cardiac concerns or symptoms Strength training completed today  Goals Unmet:  Not Applicable  Comments: Pt able to follow exercise prescription today without complaint.  Will continue to monitor for progression.    Dr. Emily Filbert is Medical Director for [place] and [facility].

## 2021-02-18 ENCOUNTER — Other Ambulatory Visit: Payer: Self-pay

## 2021-02-18 DIAGNOSIS — I214 Non-ST elevation (NSTEMI) myocardial infarction: Secondary | ICD-10-CM

## 2021-02-18 NOTE — Progress Notes (Signed)
Daily Session Note  Patient Details  Name: BRYNNLY BONET MRN: [id number] Date of Birth: 1934-07-08 Referring Provider:   Flowsheet Row Cardiac Rehab from 01/02/2021 in [facility] Cardiac and Pulmonary Rehab  Referring Provider [name]      Encounter Date: 02/18/2021  Check In:  Session Check In - 02/18/21 0949      Check-In   Supervising physician immediately available to respond to emergencies See telemetry face sheet for immediately available ER MD    Location [facility]-Cardiac & Pulmonary Rehab    Staff Present Birdie Sons, MPA, Elveria Rising, BA, ACSM CEP, Exercise Physiologist;Kara Eliezer Bottom, MS Exercise Physiologist    Virtual Visit No    Medication changes reported     No    Fall or balance concerns reported    No    Warm-up and Cool-down Performed on first and last piece of equipment    Resistance Training Performed Yes    VAD Patient? No    PAD/SET Patient? No      Pain Assessment   Currently in Pain? No/denies              Social History   Tobacco Use  Smoking Status Never Smoker  Smokeless Tobacco Never Used    Goals Met:  Independence with exercise equipment Exercise tolerated well No report of cardiac concerns or symptoms Strength training completed today  Goals Unmet:  Not Applicable  Comments: Pt able to follow exercise prescription today without complaint.  Will continue to monitor for progression.    Dr. Emily Filbert is Medical Director for [place] and [facility].

## 2021-02-20 ENCOUNTER — Other Ambulatory Visit: Payer: Self-pay

## 2021-02-20 DIAGNOSIS — I214 Non-ST elevation (NSTEMI) myocardial infarction: Secondary | ICD-10-CM | POA: Diagnosis not present

## 2021-02-20 NOTE — Progress Notes (Signed)
Daily Session Note  Patient Details  Name: ALYIA LACERTE MRN: [id number] Date of Birth: Feb 16, 1934 Referring Provider:   Flowsheet Row Cardiac Rehab from 01/02/2021 in [facility] Cardiac and Pulmonary Rehab  Referring Provider [name]      Encounter Date: 02/20/2021  Check In:  Session Check In - 02/20/21 [place]      Check-In   Supervising physician immediately available to respond to emergencies See telemetry face sheet for immediately available ER MD    Location [facility]-Cardiac & Pulmonary Rehab    Staff Present Birdie Sons, MPA, RN;Melissa Caiola RDN, Rowe Pavy, BA, ACSM CEP, Exercise Physiologist    Virtual Visit No    Medication changes reported     No    Fall or balance concerns reported    No    Warm-up and Cool-down Performed on first and last piece of equipment    Resistance Training Performed Yes    VAD Patient? No    PAD/SET Patient? No      Pain Assessment   Currently in Pain? No/denies              Social History   Tobacco Use  Smoking Status Never Smoker  Smokeless Tobacco Never Used    Goals Met:  Independence with exercise equipment Exercise tolerated well No report of cardiac concerns or symptoms Strength training completed today  Goals Unmet:  Not Applicable  Comments: Pt able to follow exercise prescription today without complaint.  Will continue to monitor for progression.    Dr. Emily Filbert is Medical Director for [place] and [facility].

## 2021-02-22 ENCOUNTER — Other Ambulatory Visit: Payer: Self-pay

## 2021-02-22 ENCOUNTER — Emergency Department: Payer: PPO

## 2021-02-22 ENCOUNTER — Emergency Department
Admission: EM | Admit: 2021-02-22 | Discharge: 2021-02-22 | Disposition: A | Discharge status: Home / Self Care | Payer: PPO | Attending: Emergency Medicine | Admitting: Emergency Medicine

## 2021-02-22 DIAGNOSIS — I517 Cardiomegaly: Secondary | ICD-10-CM | POA: Diagnosis not present

## 2021-02-22 DIAGNOSIS — I2510 Atherosclerotic heart disease of native coronary artery without angina pectoris: Secondary | ICD-10-CM | POA: Insufficient documentation

## 2021-02-22 DIAGNOSIS — R002 Palpitations: Secondary | ICD-10-CM | POA: Insufficient documentation

## 2021-02-22 DIAGNOSIS — Z79899 Other long term (current) drug therapy: Secondary | ICD-10-CM | POA: Insufficient documentation

## 2021-02-22 DIAGNOSIS — I10 Essential (primary) hypertension: Secondary | ICD-10-CM | POA: Diagnosis not present

## 2021-02-22 DIAGNOSIS — Z96653 Presence of artificial knee joint, bilateral: Secondary | ICD-10-CM | POA: Diagnosis not present

## 2021-02-22 DIAGNOSIS — E039 Hypothyroidism, unspecified: Secondary | ICD-10-CM | POA: Insufficient documentation

## 2021-02-22 DIAGNOSIS — Z7982 Long term (current) use of aspirin: Secondary | ICD-10-CM | POA: Insufficient documentation

## 2021-02-22 DIAGNOSIS — R079 Chest pain, unspecified: Secondary | ICD-10-CM | POA: Diagnosis not present

## 2021-02-22 HISTORY — DX: Acute myocardial infarction, unspecified: I21.9

## 2021-02-22 LAB — CBC WITH DIFFERENTIAL/PLATELET
Abs Immature Granulocytes: 0.02 10*3/uL (ref 0.00–0.07)
Basophils Absolute: 0 10*3/uL (ref 0.0–0.1)
Basophils Relative: 1 %
Eosinophils Absolute: 0.3 10*3/uL (ref 0.0–0.5)
Eosinophils Relative: 4 %
HCT: 35.6 % — ABNORMAL LOW (ref 36.0–46.0)
Hemoglobin: 11.5 g/dL — ABNORMAL LOW (ref 12.0–15.0)
Immature Granulocytes: 0 %
Lymphocytes Relative: 21 %
Lymphs Abs: 1.3 10*3/uL (ref 0.7–4.0)
MCH: 29.6 pg (ref 26.0–34.0)
MCHC: 32.3 g/dL (ref 30.0–36.0)
MCV: 91.8 fL (ref 80.0–100.0)
Monocytes Absolute: 0.7 10*3/uL (ref 0.1–1.0)
Monocytes Relative: 11 %
Neutro Abs: 3.9 10*3/uL (ref 1.7–7.7)
Neutrophils Relative %: 63 %
Platelets: 147 10*3/uL — ABNORMAL LOW (ref 150–400)
RBC: 3.88 MIL/uL (ref 3.87–5.11)
RDW: 13.3 % (ref 11.5–15.5)
WBC: 6.3 10*3/uL (ref 4.0–10.5)
nRBC: 0 % (ref 0.0–0.2)

## 2021-02-22 LAB — COMPREHENSIVE METABOLIC PANEL
ALT: 30 U/L (ref 0–44)
AST: 37 U/L (ref 15–41)
Albumin: 3.9 g/dL (ref 3.5–5.0)
Alkaline Phosphatase: 87 U/L (ref 38–126)
Anion gap: 7 (ref 5–15)
BUN: 19 mg/dL (ref 8–23)
CO2: 26 mmol/L (ref 22–32)
Calcium: 9.5 mg/dL (ref 8.9–10.3)
Chloride: 107 mmol/L (ref 98–111)
Creatinine, Ser: 0.76 mg/dL (ref 0.44–1.00)
GFR, Estimated: >60 mL/min (ref >60)
Glucose, Bld: 117 mg/dL — ABNORMAL HIGH (ref 70–99)
Potassium: 4.1 mmol/L (ref 3.5–5.1)
Sodium: 140 mmol/L (ref 135–145)
Total Bilirubin: 0.9 mg/dL (ref 0.3–1.2)
Total Protein: 6.7 g/dL (ref 6.5–8.1)

## 2021-02-22 LAB — MAGNESIUM: Magnesium: 1.8 mg/dL (ref 1.7–2.4)

## 2021-02-22 LAB — TROPONIN I (HIGH SENSITIVITY)
Troponin I (High Sensitivity): 23 ng/L — ABNORMAL HIGH (ref <18)
Troponin I (High Sensitivity): 24 ng/L — ABNORMAL HIGH (ref <18)

## 2021-02-22 LAB — TSH: TSH: 1.997 u[IU]/mL (ref 0.350–4.500)

## 2021-02-22 NOTE — ED Triage Notes (Addendum)
EMS brought in from home for intermittent palpitations since this morning around 0330 when she got up to go to the restroom. Denies chest pain/SOB. Hx MI. Denies recent illness/N/V/D. Takes ASA 81mg  daily.

## 2021-02-22 NOTE — ED Notes (Signed)
XR at bedside

## 2021-02-22 NOTE — ED Provider Notes (Signed)
[facility] Emergency Department Provider Note   ____________________________________________   Event Date/Time   First MD Initiated Contact with Patient 02/22/21 1936     (approximate)  I have reviewed the triage vital signs and the nursing notes.   HISTORY  Chief Complaint Palpitations    HPI EVENY Maria Trujillo is a 87 y.o. female with past medical history of hypertension, hyperlipidemia, CAD, and hypothyroidism who presents to the ED complaining of palpitations.  Patient reports since waking up this morning around 3:30 AM she has had intermittent feelings of her heart racing.  She states it seems to be better when she gets up and moves around.  She denies any associated chest pain or shortness of breath.  She has not having any symptoms currently, states they have improved since the time when she called the ambulance.  She is otherwise felt well recently with no fever, cough, nausea, vomiting, diarrhea, or dysuria.  She denies any history of arrhythmia but did have an MI back in January.        Past Medical History:  Diagnosis Date  . Arthritis   . History of kidney stones   . Hypothyroidism   . Myocardial infarct ([place])   . PONV (postoperative nausea and vomiting)     Patient Active Problem List   Diagnosis Date Noted  . Acquired hypothyroidism 12/25/2020  . Nontraumatic complete tear of right rotator cuff 12/25/2020  . NSTEMI (non-ST elevated myocardial infarction) ([place]) 11/03/2020  . Primary open angle glaucoma (POAG) of both eyes, severe stage 11/01/2018  . Primary osteoarthritis involving multiple joints 02/18/2017  . Esophageal reflux 02/15/2017  . HTN (hypertension) 02/15/2017  . S/P total knee arthroplasty 02/15/2017  . Hyperlipidemia, unspecified 03/06/2014  . Restless leg syndrome 03/06/2014    Past Surgical History:  Procedure Laterality Date  . BREAST EXCISIONAL BIOPSY Bilateral 1984   benign  . CATARACT EXTRACTION, BILATERAL     . CHOLECYSTECTOMY    . JOINT REPLACEMENT Left 12/2012   knee  . KNEE ARTHROPLASTY Right 02/15/2017   Procedure: COMPUTER ASSISTED TOTAL KNEE ARTHROPLASTY;  Surgeon: Dereck Leep, MD;  Location: [facility] ORS;  Service: Orthopedics;  Laterality: Right;  . LEFT HEART CATH AND CORONARY ANGIOGRAPHY N/A 11/04/2020   Procedure: LEFT HEART CATH AND CORONARY ANGIOGRAPHY;  Surgeon: Isaias Cowman, MD;  Location: [place] CV LAB;  Service: Cardiovascular;  Laterality: N/A;  . PHOTOCOAGULATION WITH LASER Right 04/20/2018   Procedure: TRANSCLERAL DIODE CYCLOPHOTOCOAGULATION  RIGHT per Maria Trujillo block;  Surgeon: Leandrew Koyanagi, MD;  Location: [place];  Service: Ophthalmology;  Laterality: Right;  . ROTATOR CUFF REPAIR Left 2010  . TONSILLECTOMY      Prior to Admission medications   Medication Sig Start Date End Date Taking? Authorizing Provider  acetaminophen (TYLENOL) 650 MG CR tablet Take 650 mg by mouth daily as needed for pain.    [provider]  aspirin 81 MG tablet Take 81 mg by mouth at bedtime.    [provider]  atorvastatin (LIPITOR) 80 MG tablet Take 1 tablet (80 mg total) by mouth at bedtime. 11/05/20 02/03/21  Enzo Bi, MD  bimatoprost (LUMIGAN) 0.03 % ophthalmic solution Place 1 drop into both eyes at bedtime.    [provider]  Calcium Carb-Cholecalciferol (CALCIUM CARBONATE-VITAMIN D3) 600-400 MG-UNIT TABS Take 1 tablet by mouth daily.    [provider]  cholecalciferol (VITAMIN D) 1000 units tablet Take 1,000 Units by mouth daily.    [provider]  fexofenadine (ALLEGRA) 180 MG tablet Take 180 mg by mouth daily as needed for allergies or rhinitis.    [provider]  isosorbide mononitrate (IMDUR) 30 MG 24 hr tablet Take 1 tablet (30 mg total) by mouth daily. 11/06/20 02/04/21  Enzo Bi, MD  levothyroxine (SYNTHROID, LEVOTHROID) 100 MCG tablet Take 100 mcg by mouth daily before breakfast.    [provider]  lisinopril (ZESTRIL) 5 MG tablet Take 1 tablet (5 mg total) by mouth daily. 11/06/20 02/04/21  Enzo Bi, MD  metoprolol tartrate (LOPRESSOR) 25 MG tablet Take 0.5 tablets (12.5 mg total) by mouth 2 (two) times daily. 11/05/20 02/03/21  Enzo Bi, MD  Multiple Vitamin (MULTIVITAMIN WITH MINERALS) TABS tablet Take 1 tablet by mouth daily.    [provider]  Multiple Vitamins-Minerals (PRESERVISION AREDS 2 PO) Take 2 tablets by mouth 2 (two) times daily.    [provider]  Omega-3 Fatty Acids (FISH OIL OMEGA-3) 1000 MG CAPS Take 1,000 mg by mouth daily.    [provider]  timolol (TIMOPTIC) 0.5 % ophthalmic solution Place 1 drop into both eyes daily. 10/28/20   [provider]    Allergies Dorzolamide, Celecoxib, Penicillins, Oxycodone, Shellfish allergy, Sulfa antibiotics, Amoxicillin, Amoxicillin-pot clavulanate, Brimonidine, Cefuroxime axetil, Dipivefrin, and Timolol maleate  Family History  Problem Relation Age of Onset  . Breast cancer Sister 71    Social History Social History   Tobacco Use  . Smoking status: Never Smoker  . Smokeless tobacco: Never Used  Vaping Use  . Vaping Use: Never used  Substance Use Topics  . Alcohol use: No  . Drug use: No    Review of Systems  Constitutional: No fever/chills Eyes: No visual changes. ENT: No sore throat. Cardiovascular: Denies chest pain.  Positive for palpitations. Respiratory: Denies shortness of breath. Gastrointestinal: No abdominal pain.  No nausea, no vomiting.  No diarrhea.  No constipation. Genitourinary: Negative for dysuria. Musculoskeletal: Negative for back pain. Skin: Negative for rash. Neurological: Negative for headaches, focal weakness or numbness.  ____________________________________________   PHYSICAL EXAM:  VITAL SIGNS: ED Triage Vitals  Enc Vitals Group     BP 02/22/21 1939 (!) 147/78     Pulse Rate 02/22/21 1939 68     Resp 02/22/21 1939 18     Temp 02/22/21  1942 97.9 F (36.6 C)     Temp Source 02/22/21 1942 Oral     SpO2 02/22/21 1938 98 %     Weight 02/22/21 1945 182 lb (82.6 kg)     Height 02/22/21 1945 5\' 3"  (1.6 m)     Head Circumference --      Peak Flow --      Pain Score 02/22/21 1939 0     Pain Loc --      Pain Edu? --      Excl. in [place]? --     Constitutional: Alert and oriented. Eyes: Conjunctivae are normal. Head: Atraumatic. Nose: No congestion/rhinnorhea. Mouth/Throat: Mucous membranes are moist. Neck: Normal ROM Cardiovascular: Normal rate, regular rhythm. Grossly normal heart sounds.  2+ radial pulses bilaterally. Respiratory: Normal respiratory effort.  No retractions. Lungs CTAB. Gastrointestinal: Soft and nontender. No distention. Genitourinary: deferred Musculoskeletal: No lower extremity tenderness nor edema. Neurologic:  Normal speech and language. No gross focal neurologic deficits are appreciated. Skin:  Skin is warm, dry and intact. No rash noted. Psychiatric: Mood and affect are normal. Speech and behavior are normal.  ____________________________________________   LABS (all labs ordered are listed,  but only abnormal results are displayed)  Labs Reviewed  CBC WITH DIFFERENTIAL/PLATELET - Abnormal; Notable for the following components:      Result Value   Hemoglobin 11.5 (*)    HCT 35.6 (*)    Platelets 147 (*)    All other components within normal limits  COMPREHENSIVE METABOLIC PANEL - Abnormal; Notable for the following components:   Glucose, Bld 117 (*)    All other components within normal limits  TROPONIN I (HIGH SENSITIVITY) - Abnormal; Notable for the following components:   Troponin I (High Sensitivity) 23 (*)    All other components within normal limits  TROPONIN I (HIGH SENSITIVITY) - Abnormal; Notable for the following components:   Troponin I (High Sensitivity) 24 (*)    All other components within normal limits  MAGNESIUM  TSH    ____________________________________________  EKG  ED ECG REPORT I, Blake Divine, the attending physician, personally viewed and interpreted this ECG.   Date: 02/22/2021  EKG Time: 19:41  Rate: 57  Rhythm: normal sinus rhythm  Axis: LAD  Intervals:none  ST&T Change: None   PROCEDURES  Procedure(s) performed (including Critical Care):  Procedures   ____________________________________________   INITIAL IMPRESSION / ASSESSMENT AND PLAN / ED COURSE       87 year old female with past medical history of hypertension, hyperlipidemia, CAD, and hypothyroidism who presents to the ED with intermittent palpitations since this morning with no associated chest pain or shortness of breath.  Vital signs are stable on arrival, EKG shows normal sinus rhythm with no ischemic changes.  We will observe on cardiac monitor and check labs including troponin, electrolytes, and thyroid function.  No arrhythmias noted on cardiac monitor.  Labs are reassuring with no significant electrolyte abnormality.  2 sets of troponin are stable and patient has remained asymptomatic here in the ED.  She is appropriate for discharge home and close cardiology follow-up, counseled to return to the ED for any new or worsening symptoms.  Patient and daughter agree with plan.      ____________________________________________   FINAL CLINICAL IMPRESSION(S) / ED DIAGNOSES  Final diagnoses:  Palpitations     ED Discharge Orders    None       Note:  This document was prepared using Dragon voice recognition software and may include unintentional dictation errors.   Blake Divine, MD 02/22/21 2239

## 2021-02-25 DIAGNOSIS — I499 Cardiac arrhythmia, unspecified: Secondary | ICD-10-CM | POA: Diagnosis not present

## 2021-02-25 DIAGNOSIS — I491 Atrial premature depolarization: Secondary | ICD-10-CM | POA: Diagnosis not present

## 2021-02-25 DIAGNOSIS — R002 Palpitations: Secondary | ICD-10-CM | POA: Diagnosis not present

## 2021-02-25 DIAGNOSIS — R001 Bradycardia, unspecified: Secondary | ICD-10-CM | POA: Diagnosis not present

## 2021-02-27 ENCOUNTER — Encounter: Payer: PPO | Attending: Cardiology

## 2021-02-27 ENCOUNTER — Other Ambulatory Visit: Payer: Self-pay

## 2021-02-27 DIAGNOSIS — I214 Non-ST elevation (NSTEMI) myocardial infarction: Secondary | ICD-10-CM | POA: Diagnosis not present

## 2021-02-27 NOTE — Progress Notes (Signed)
Daily Session Note  Patient Details  Name: Maria Trujillo MRN: [id number] Date of Birth: 1934/10/23 Referring Provider:   Flowsheet Row Cardiac Rehab from 01/02/2021 in [facility] Cardiac and Pulmonary Rehab  Referring Provider [name]      Encounter Date: 02/27/2021  Check In:  Session Check In - 02/27/21 1015      Check-In   Supervising physician immediately available to respond to emergencies See telemetry face sheet for immediately available ER MD    Location [facility]-Cardiac & Pulmonary Rehab    Staff Present Birdie Sons, MPA, RN;Melissa Caiola RDN, LDN;Jessica Luan Pulling, MA, RCEP, CCRP, CCET    Virtual Visit No    Medication changes reported     No    Fall or balance concerns reported    No    Warm-up and Cool-down Performed on first and last piece of equipment    Resistance Training Performed Yes    VAD Patient? No    PAD/SET Patient? No      Pain Assessment   Currently in Pain? No/denies              Social History   Tobacco Use  Smoking Status Never Smoker  Smokeless Tobacco Never Used    Goals Met:  Independence with exercise equipment Exercise tolerated well No report of cardiac concerns or symptoms Strength training completed today  Goals Unmet:  Not Applicable  Comments: Pt able to follow exercise prescription today without complaint.  Will continue to monitor for progression.    Dr. Emily Filbert is Medical Director for [place] and [facility].

## 2021-03-04 ENCOUNTER — Other Ambulatory Visit: Payer: Self-pay

## 2021-03-04 DIAGNOSIS — I214 Non-ST elevation (NSTEMI) myocardial infarction: Secondary | ICD-10-CM | POA: Diagnosis not present

## 2021-03-04 NOTE — Progress Notes (Signed)
Daily Session Note  Patient Details  Name: Maria Trujillo MRN: [id number] Date of Birth: 09-05-1934 Referring Provider:   Flowsheet Row Cardiac Rehab from 01/02/2021 in [facility] Cardiac and Pulmonary Rehab  Referring Provider [name]      Encounter Date: 03/04/2021  Check In:  Session Check In - 03/04/21 1003      Check-In   Supervising physician immediately available to respond to emergencies See telemetry face sheet for immediately available ER MD    Location [facility]-Cardiac & Pulmonary Rehab    Staff Present Birdie Sons, MPA, Elveria Rising, BA, ACSM CEP, Exercise Physiologist;Kara Eliezer Bottom, MS Exercise Physiologist    Virtual Visit No    Medication changes reported     No    Fall or balance concerns reported    No    Warm-up and Cool-down Performed on first and last piece of equipment    Resistance Training Performed Yes    VAD Patient? No    PAD/SET Patient? No      Pain Assessment   Currently in Pain? No/denies              Social History   Tobacco Use  Smoking Status Never Smoker  Smokeless Tobacco Never Used    Goals Met:  Independence with exercise equipment Exercise tolerated well No report of cardiac concerns or symptoms Strength training completed today  Goals Unmet:  Not Applicable  Comments: Pt able to follow exercise prescription today without complaint.  Will continue to monitor for progression.     Dr. Emily Filbert is Medical Director for [place] and [facility].

## 2021-03-11 ENCOUNTER — Other Ambulatory Visit: Payer: Self-pay

## 2021-03-11 DIAGNOSIS — I214 Non-ST elevation (NSTEMI) myocardial infarction: Secondary | ICD-10-CM

## 2021-03-11 NOTE — Progress Notes (Signed)
Daily Session Note  Patient Details  Name: Maria Trujillo MRN: [id number] Date of Birth: 07/08/1934 Referring Provider:   Flowsheet Row Cardiac Rehab from 01/02/2021 in [facility] Cardiac and Pulmonary Rehab  Referring Provider [name]      Encounter Date: 03/11/2021  Check In:  Session Check In - 03/11/21 0953      Check-In   Supervising physician immediately available to respond to emergencies See telemetry face sheet for immediately available ER MD    Location [facility]-Cardiac & Pulmonary Rehab    Staff Present Birdie Sons, MPA, RN;Melissa Caiola RDN, Rowe Pavy, BA, ACSM CEP, Exercise Physiologist;Joseph Tessie Fass RCP,RRT,BSRT    Virtual Visit No    Medication changes reported     No    Fall or balance concerns reported    No    Warm-up and Cool-down Performed on first and last piece of equipment    Resistance Training Performed Yes    VAD Patient? No    PAD/SET Patient? No      Pain Assessment   Currently in Pain? No/denies              Social History   Tobacco Use  Smoking Status Never Smoker  Smokeless Tobacco Never Used    Goals Met:  Independence with exercise equipment Exercise tolerated well No report of cardiac concerns or symptoms Strength training completed today  Goals Unmet:  Not Applicable  Comments: Pt able to follow exercise prescription today without complaint.  Will continue to monitor for progression.     Dr. Emily Filbert is Medical Director for [place] and [facility].

## 2021-03-12 ENCOUNTER — Encounter: Payer: Self-pay | Admitting: *Deleted

## 2021-03-12 DIAGNOSIS — I214 Non-ST elevation (NSTEMI) myocardial infarction: Secondary | ICD-10-CM

## 2021-03-12 NOTE — Progress Notes (Signed)
Cardiac Individual Treatment Plan  Patient Details  Name: Maria Trujillo MRN: [id number] Date of Birth: Mar 24, 1934 Referring Provider:   Flowsheet Row Cardiac Rehab from 01/02/2021 in [facility] Cardiac and Pulmonary Rehab  Referring Provider [name]      Initial Encounter Date:  Flowsheet Row Cardiac Rehab from 01/02/2021 in [facility] Cardiac and Pulmonary Rehab  Date 01/02/21      Visit Diagnosis: NSTEMI (non-ST elevated myocardial infarction) [facility])  Patient's Home Medications on Admission:  Current Outpatient Medications:  .  acetaminophen (TYLENOL) 650 MG CR tablet, Take 650 mg by mouth daily as needed for pain., Disp: , Rfl:  .  aspirin 81 MG tablet, Take 81 mg by mouth at bedtime., Disp: , Rfl:  .  atorvastatin (LIPITOR) 80 MG tablet, Take 1 tablet (80 mg total) by mouth at bedtime., Disp: 30 tablet, Rfl: 2 .  bimatoprost (LUMIGAN) 0.03 % ophthalmic solution, Place 1 drop into both eyes at bedtime., Disp: , Rfl:  .  Calcium Carb-Cholecalciferol (CALCIUM CARBONATE-VITAMIN D3) 600-400 MG-UNIT TABS, Take 1 tablet by mouth daily., Disp: , Rfl:  .  cholecalciferol (VITAMIN D) 1000 units tablet, Take 1,000 Units by mouth daily., Disp: , Rfl:  .  fexofenadine (ALLEGRA) 180 MG tablet, Take 180 mg by mouth daily as needed for allergies or rhinitis., Disp: , Rfl:  .  isosorbide mononitrate (IMDUR) 30 MG 24 hr tablet, Take 1 tablet (30 mg total) by mouth daily., Disp: 30 tablet, Rfl: 2 .  levothyroxine (SYNTHROID, LEVOTHROID) 100 MCG tablet, Take 100 mcg by mouth daily before breakfast., Disp: , Rfl:  .  lisinopril (ZESTRIL) 5 MG tablet, Take 1 tablet (5 mg total) by mouth daily., Disp: 30 tablet, Rfl: 2 .  metoprolol tartrate (LOPRESSOR) 25 MG tablet, Take 0.5 tablets (12.5 mg total) by mouth 2 (two) times daily., Disp: 30 tablet, Rfl: 2 .  Multiple Vitamin (MULTIVITAMIN WITH MINERALS) TABS tablet, Take 1 tablet by mouth daily., Disp: , Rfl:  .  Multiple Vitamins-Minerals (PRESERVISION AREDS 2 PO),  Take 2 tablets by mouth 2 (two) times daily., Disp: , Rfl:  .  Omega-3 Fatty Acids (FISH OIL OMEGA-3) 1000 MG CAPS, Take 1,000 mg by mouth daily., Disp: , Rfl:  .  timolol (TIMOPTIC) 0.5 % ophthalmic solution, Place 1 drop into both eyes daily., Disp: , Rfl:   Past Medical History: Past Medical History:  Diagnosis Date  . Arthritis   . History of kidney stones   . Hypothyroidism   . Myocardial infarct ([place])   . PONV (postoperative nausea and vomiting)     Tobacco Use: Social History   Tobacco Use  Smoking Status Never Smoker  Smokeless Tobacco Never Used    Labs: Recent Review Scientist, physiological    Labs for ITP Cardiac and Pulmonary Rehab Latest Ref Rng & Units 11/04/2020   Cholestrol 0 - 200 mg/dL 150   LDLCALC 0 - 99 mg/dL 81   HDL >40 mg/dL 60   Trlycerides <150 mg/dL 45       Exercise Target Goals: Exercise Program Goal: Individual exercise prescription set using results from initial 6 min walk test and THRR while considering  patient's activity barriers and safety.   Exercise Prescription Goal: Initial exercise prescription builds to 30-45 minutes a day of aerobic activity, 2-3 days per week.  Home exercise guidelines will be given to patient during program as part of exercise prescription that the participant will acknowledge.   Education: Aerobic Exercise: - Group verbal and visual presentation on the  components of exercise prescription. Introduces F.I.T.T principle from ACSM for exercise prescriptions.  Reviews F.I.T.T. principles of aerobic exercise including progression. Written material given at graduation.   Education: Resistance Exercise: - Group verbal and visual presentation on the components of exercise prescription. Introduces F.I.T.T principle from ACSM for exercise prescriptions  Reviews F.I.T.T. principles of resistance exercise including progression. Written material given at graduation. Flowsheet Row Cardiac Rehab from 02/27/2021 in [facility] Cardiac and  Pulmonary Rehab  Date 02/20/21  Educator [facility]  Instruction Review Code 1- Verbalizes Understanding       Education: Exercise & Equipment Safety: - Individual verbal instruction and demonstration of equipment use and safety with use of the equipment. Flowsheet Row Cardiac Rehab from 02/27/2021 in [facility] Cardiac and Pulmonary Rehab  Date 01/02/21  Educator AS  Instruction Review Code 1- Verbalizes Understanding      Education: Exercise Physiology & General Exercise Guidelines: - Group verbal and written instruction with models to review the exercise physiology of the cardiovascular system and associated critical values. Provides general exercise guidelines with specific guidelines to those with heart or lung disease.    Education: Flexibility, Balance, Mind/Body Relaxation: - Group verbal and visual presentation with interactive activity on the components of exercise prescription. Introduces F.I.T.T principle from ACSM for exercise prescriptions. Reviews F.I.T.T. principles of flexibility and balance exercise training including progression. Also discusses the mind body connection.  Reviews various relaxation techniques to help reduce and manage stress (i.e. Deep breathing, progressive muscle relaxation, and visualization). Balance handout provided to take home. Written material given at graduation. Flowsheet Row Cardiac Rehab from 02/27/2021 in [facility] Cardiac and Pulmonary Rehab  Date 02/27/21  Educator AS  Instruction Review Code 1- Verbalizes Understanding      Activity Barriers & Risk Stratification:  Activity Barriers & Cardiac Risk Stratification - 12/25/20 1403      Activity Barriers & Cardiac Risk Stratification   Activity Barriers Left Knee Replacement;Right Knee Replacement;Other (comment);Assistive Device;Balance Concerns    Comments both shoulders are "bad"    Cardiac Risk Stratification Moderate           6 Minute Walk:  6 Minute Walk    Row Name 01/02/21 1545         6  Minute Walk   Phase Initial     Distance 700 feet     Walk Time 6 minutes     # of Rest Breaks 0     MPH 1.3     METS 0.73     RPE 11     Perceived Dyspnea  1     VO2 Peak 2.57     Resting HR 54 bpm     Resting BP 106/58     Resting Oxygen Saturation  99 %     Exercise Oxygen Saturation  during 6 min walk 98 %     Max Ex. HR 103 bpm     Max Ex. BP 128/60     2 Minute Post BP 104/58            Oxygen Initial Assessment:   Oxygen Re-Evaluation:   Oxygen Discharge (Final Oxygen Re-Evaluation):   Initial Exercise Prescription:  Initial Exercise Prescription - 01/02/21 1500      Date of Initial Exercise RX and Referring Provider   Date 01/02/21    Referring Provider [name]      Treadmill   MPH 1    Grade 0    Minutes 15    METs 1.77  Recumbant Bike   Level 1    RPM 60    Minutes 15    METs 1      NuStep   Level 1    SPM 80    Minutes 15    METs 1      REL-XR   Level 1    Speed 50    Minutes 15    METs 1      Prescription Details   Frequency (times per week) 2    Duration Progress to 30 minutes of continuous aerobic without signs/symptoms of physical distress      Intensity   THRR 40-80% of Max Heartrate 87-119    Ratings of Perceived Exertion 11-13    Perceived Dyspnea 0-4      Resistance Training   Training Prescription Yes    Weight 2 lb    Reps 10-15           Perform Capillary Blood Glucose checks as needed.  Exercise Prescription Changes:  Exercise Prescription Changes    Row Name 01/02/21 1500 01/21/21 1200 02/03/21 1300 02/18/21 1200 03/04/21 1400     Response to Exercise   Blood Pressure (Admit) 106/58 132/54 142/72 130/62 148/70   Blood Pressure (Exercise) 128/60 122/60 130/70 138/70 124/68   Blood Pressure (Exit) 104/58 114/60 120/62 130/62 116/60   Heart Rate (Admit) 54 bpm 51 bpm 67 bpm 45 bpm 55 bpm   Heart Rate (Exercise) 103 bpm 76 bpm 69 bpm 72 bpm 82 bpm   Heart Rate (Exit) 54 bpm 72 bpm 52 bpm 56 bpm 60  bpm   Oxygen Saturation (Admit) 98 % -- -- -- --   Oxygen Saturation (Exercise) 99 % -- -- -- --   Rating of Perceived Exertion (Exercise) 11 12 13 13 13    Perceived Dyspnea (Exercise) 1 -- -- -- --   Symptoms none none none none none   Duration -- Continue with 30 min of aerobic exercise without signs/symptoms of physical distress. Continue with 30 min of aerobic exercise without signs/symptoms of physical distress. Continue with 30 min of aerobic exercise without signs/symptoms of physical distress. Continue with 30 min of aerobic exercise without signs/symptoms of physical distress.   Intensity -- THRR unchanged THRR unchanged THRR unchanged THRR unchanged     Progression   Progression -- Continue to progress workloads to maintain intensity without signs/symptoms of physical distress. Continue to progress workloads to maintain intensity without signs/symptoms of physical distress. Continue to progress workloads to maintain intensity without signs/symptoms of physical distress. Continue to progress workloads to maintain intensity without signs/symptoms of physical distress.   Average METs -- 2 2 1.8 2.1     Resistance Training   Training Prescription -- Yes Yes Yes Yes   Weight -- 2 lb 2 lb 2 lb 2 lb   Reps -- 10-15 10-15 10-15 10-15     Interval Training   Interval Training -- -- No No No     Treadmill   MPH -- -- -- -- 1.3   Grade -- -- -- -- 0   Minutes -- -- -- -- 15   METs -- -- -- -- 2     NuStep   Level -- 1 3 -- --   Minutes -- 30 30 -- --   METs -- 2 2.2  highest reported -- --     T5 Nustep   Level -- -- -- -- 1   Minutes -- -- -- -- 15  METs -- -- -- -- 1.9     Home Exercise Plan   Plans to continue exercise at -- -- Home (comment)  walking Home (comment)  walking Home (comment)  walking   Frequency -- -- Add 2 additional days to program exercise sessions. Add 2 additional days to program exercise sessions. Add 2 additional days to program exercise sessions.    Initial Home Exercises Provided -- -- 01/30/21 01/30/21 01/30/21          Exercise Comments:  Exercise Comments    Row Name 01/07/21 0940           Exercise Comments First full day of exercise!  Patient was oriented to gym and equipment including functions, settings, policies, and procedures.  Patient's individual exercise prescription and treatment plan were reviewed.  All starting workloads were established based on the results of the 6 minute walk test done at initial orientation visit.  The plan for exercise progression was also introduced and progression will be customized based on patient's performance and goals.              Exercise Goals and Review:  Exercise Goals    Row Name 01/02/21 1548             Exercise Goals   Increase Physical Activity Yes       Intervention Provide advice, education, support and counseling about physical activity/exercise needs.;Develop an individualized exercise prescription for aerobic and resistive training based on initial evaluation findings, risk stratification, comorbidities and participant's personal goals.       Expected Outcomes Short Term: Attend rehab on a regular basis to increase amount of physical activity.;Long Term: Add in home exercise to make exercise part of routine and to increase amount of physical activity.;Long Term: Exercising regularly at least 3-5 days a week.       Increase Strength and Stamina Yes       Intervention Provide advice, education, support and counseling about physical activity/exercise needs.;Develop an individualized exercise prescription for aerobic and resistive training based on initial evaluation findings, risk stratification, comorbidities and participant's personal goals.       Expected Outcomes Short Term: Increase workloads from initial exercise prescription for resistance, speed, and METs.;Long Term: Improve cardiorespiratory fitness, muscular endurance and strength as measured by increased METs  and functional capacity (6MWT);Short Term: Perform resistance training exercises routinely during rehab and add in resistance training at home       Able to understand and use rate of perceived exertion (RPE) scale Yes       Intervention Provide education and explanation on how to use RPE scale       Expected Outcomes Short Term: Able to use RPE daily in rehab to express subjective intensity level;Long Term:  Able to use RPE to guide intensity level when exercising independently       Able to understand and use Dyspnea scale Yes       Intervention Provide education and explanation on how to use Dyspnea scale       Expected Outcomes Short Term: Able to use Dyspnea scale daily in rehab to express subjective sense of shortness of breath during exertion;Long Term: Able to use Dyspnea scale to guide intensity level when exercising independently       Knowledge and understanding of Target Heart Rate Range (THRR) Yes       Intervention Provide education and explanation of THRR including how the numbers were predicted and where they are located for reference  Expected Outcomes Short Term: Able to state/look up THRR;Short Term: Able to use daily as guideline for intensity in rehab;Long Term: Able to use THRR to govern intensity when exercising independently       Able to check pulse independently Yes       Intervention Provide education and demonstration on how to check pulse in carotid and radial arteries.;Review the importance of being able to check your own pulse for safety during independent exercise       Expected Outcomes Short Term: Able to explain why pulse checking is important during independent exercise;Long Term: Able to check pulse independently and accurately       Understanding of Exercise Prescription Yes       Intervention Provide education, explanation, and written materials on patient's individual exercise prescription       Expected Outcomes Short Term: Able to explain program exercise  prescription;Long Term: Able to explain home exercise prescription to exercise independently              Exercise Goals Re-Evaluation :  Exercise Goals Re-Evaluation    Row Name 01/07/21 0940 01/21/21 1257 01/23/21 1027 01/30/21 1146 02/03/21 1356     Exercise Goal Re-Evaluation   Exercise Goals Review Increase Physical Activity;Able to understand and use rate of perceived exertion (RPE) scale;Knowledge and understanding of Target Heart Rate Range (THRR);Understanding of Exercise Prescription;Increase Strength and Stamina;Able to understand and use Dyspnea scale;Able to check pulse independently Increase Physical Activity;Increase Strength and Stamina Increase Physical Activity;Increase Strength and Stamina Increase Physical Activity;Increase Strength and Stamina Increase Physical Activity;Increase Strength and Stamina   Comments Reviewed RPE and dyspnea scales, THR and program prescription with pt today.  Pt voiced understanding and was given a copy of goals to take home. Juan is tolerating exercise well.  She doesnt quite reach her THR range.  Staff will review THR. Vesper does some yardwork when weather is nice. She tries to walk around the house every hour. EP will review home exercise with her. Reviewed home exercise with pt today.  Pt plans to walk/consider joining [facility] [place] for exercise.  Reviewed THR, pulse, RPE, sign and symptoms, pulse oximetery and when to call 911 or MD.  Also discussed weather considerations and indoor options.  Pt voiced understanding. Aerica is doing well in rehab.  She really likes the NuStep.  She has done level 3 one day and level 2 another day.  We will work with her to keep her consistent and monitor progress.   Expected Outcomes Short: Use RPE daily to regulate intensity. Long: Follow program prescription in THR. Short: work towards [place] range Long:  improve overall stamina Short: continue to attend rehab consistently Long:  improve overall stamina Short: exercise 1-2  days outside program sessions Long: improve stamina Short: Keep NuStep at level 3  Long: Continue to improve stamina   Row Name 02/18/21 1226 02/27/21 1005 03/04/21 1424         Exercise Goal Re-Evaluation   Exercise Goals Review Increase Physical Activity;Increase Strength and Stamina Increase Physical Activity;Increase Strength and Stamina Increase Physical Activity;Increase Strength and Stamina;Understanding of Exercise Prescription     Comments Wilba saw her ortho Dr and he was very pleased her leg strength had improved!  She feels she is getting stronger. Daana is walking outside HT classes.  She walks in her house and in her driveway.  We reviewed the importance of styaing hydrated. Amane is doing well in rehab. She is now able to go without breaks! We  will start to increase her workloads and monitor her progress.     Expected Outcomes Short:  continue to attend consistently Long:  build strength and stamina Short:  continue to exercise consistently Long:  maintain exercise on her own Short: Increase workloads Long: COnitnue to improve stamina            Discharge Exercise Prescription (Final Exercise Prescription Changes):  Exercise Prescription Changes - 03/04/21 1400      Response to Exercise   Blood Pressure (Admit) 148/70    Blood Pressure (Exercise) 124/68    Blood Pressure (Exit) 116/60    Heart Rate (Admit) 55 bpm    Heart Rate (Exercise) 82 bpm    Heart Rate (Exit) 60 bpm    Rating of Perceived Exertion (Exercise) 13    Symptoms none    Duration Continue with 30 min of aerobic exercise without signs/symptoms of physical distress.    Intensity THRR unchanged      Progression   Progression Continue to progress workloads to maintain intensity without signs/symptoms of physical distress.    Average METs 2.1      Resistance Training   Training Prescription Yes    Weight 2 lb    Reps 10-15      Interval Training   Interval Training No      Treadmill   MPH 1.3     Grade 0    Minutes 15    METs 2      T5 Nustep   Level 1    Minutes 15    METs 1.9      Home Exercise Plan   Plans to continue exercise at Home (comment)   walking   Frequency Add 2 additional days to program exercise sessions.    Initial Home Exercises Provided 01/30/21           Nutrition:  Target Goals: Understanding of nutrition guidelines, daily intake of sodium 1500mg , cholesterol 200mg , calories 30% from fat and 7% or less from saturated fats, daily to have 5 or more servings of fruits and vegetables.  Education: All About Nutrition: -Group instruction provided by verbal, written material, interactive activities, discussions, models, and posters to present general guidelines for heart healthy nutrition including fat, fiber, MyPlate, the role of sodium in heart healthy nutrition, utilization of the nutrition label, and utilization of this knowledge for meal planning. Follow up email sent as well. Written material given at graduation.   Biometrics:  Pre Biometrics - 01/02/21 1548      Pre Biometrics   Height 5' 3.25" (1.607 m)    Weight 188 lb 1.6 oz (85.3 kg)    BMI (Calculated) 33.04    Single Leg Stand 12.19 seconds            Nutrition Therapy Plan and Nutrition Goals:  Nutrition Therapy & Goals - 01/08/21 1433      Nutrition Therapy   Diet Heart healthy, low Na    Drug/Food Interactions Statins/Certain Fruits    Protein (specify units) 65g    Fiber 25 grams    Whole Grain Foods 3 servings    Saturated Fats 12 max. grams    Fruits and Vegetables 5 servings/day    Sodium 1.5 grams      Personal Nutrition Goals   Nutrition Goal ST: try whole wheat bread LT: continue with heart healthy changes    Comments She reports making changes for years before her heart event due to her husband having high blood pressure.  She reports not frying and not using much grease, using lean meats. B: bowl of cereal (honeynut cheerios or raisin bran or toasted oats - one time  a week will have sausage and egg. Will have coffee (black), she will also have a small glass of orange juice. L: sandwich with [place] or ham with some lettuce and mayo and mustard (white wheat or honey wheat) or some peanut butter crackers with ritz crackers and activia yogurt and tea. S: cookie or hershey candy. She enjoys oranges, grapes, and bananas. D: chicken tenders or boneless ribeye porkchop on george foreman grill with salad. She also enjoys pinto beans and turnip greens. Discussed heart healthy eating.      Intervention Plan   Intervention Prescribe, educate and counsel regarding individualized specific dietary modifications aiming towards targeted core components such as weight, hypertension, lipid management, diabetes, heart failure and other comorbidities.;Nutrition handout(s) given to patient.    Expected Outcomes Short Term Goal: Understand basic principles of dietary content, such as calories, fat, sodium, cholesterol and nutrients.;Short Term Goal: A plan has been developed with personal nutrition goals set during dietitian appointment.;Long Term Goal: Adherence to prescribed nutrition plan.           Nutrition Assessments:  MEDIFICTS Score Key:  ?70 Need to make dietary changes   40-70 Heart Healthy Diet  ? 40 Therapeutic Level Cholesterol Diet  Flowsheet Row Cardiac Rehab from 01/02/2021 in [facility] Cardiac and Pulmonary Rehab  Picture Your Plate Total Score on Admission 57     Picture Your Plate Scores:  [id number] Unhealthy dietary pattern with much room for improvement.  41-50 Dietary pattern unlikely to meet recommendations for good health and room for improvement.  51-60 More healthful dietary pattern, with some room for improvement.   >60 Healthy dietary pattern, although there may be some specific behaviors that could be improved.    Nutrition Goals Re-Evaluation:  Nutrition Goals Re-Evaluation    Chancellor Name 01/23/21 1020 02/27/21 1007           Goals   Current  Weight 186 lb 11.2 oz (84.7 kg) --      Nutrition Goal ST: add fruit to snack LT: continue with heart healthy changes --      Comment She has tried whole wheat bread and reports liking it more than she thought she would. She has candy for an afternoon snack and has been trying to add fruit - has tried oranges. Dejane has cut down on red meat.  She uses a [employer].  She tries to have salad.  She also is watching portion sizes.      Expected Outcome ST: add fruit to snack LT: continue with heart healthy changes ST/LT: maintain heart healthy eating             Nutrition Goals Discharge (Final Nutrition Goals Re-Evaluation):  Nutrition Goals Re-Evaluation - 02/27/21 1007      Goals   Comment Yvanna has cut down on red meat.  She uses a [employer].  She tries to have salad.  She also is watching portion sizes.    Expected Outcome ST/LT: maintain heart healthy eating           Psychosocial: Target Goals: Acknowledge presence or absence of significant depression and/or stress, maximize coping skills, provide positive support system. Participant is able to verbalize types and ability to use techniques and skills needed for reducing stress and depression.   Education: Stress, Anxiety, and Depression - Group verbal  and visual presentation to define topics covered.  Reviews how body is impacted by stress, anxiety, and depression.  Also discusses healthy ways to reduce stress and to treat/manage anxiety and depression.  Written material given at graduation. Flowsheet Row Cardiac Rehab from 02/27/2021 in [facility] Cardiac and Pulmonary Rehab  Date 01/30/21  Educator [facility]  Instruction Review Code 1- [employer] Understanding      Education: Sleep Hygiene -Provides group verbal and written instruction about how sleep can affect your health.  Define sleep hygiene, discuss sleep cycles and impact of sleep habits. Review good sleep hygiene tips.    Initial Review & Psychosocial  Screening:  Initial Psych Review & Screening - 12/25/20 1408      Initial Review   Current issues with Current Stress Concerns    Source of Stress Concerns Unable to perform yard/household activities      [place]? Yes   son     Barriers   Psychosocial barriers to participate in program There are no identifiable barriers or psychosocial needs.;The patient should benefit from training in stress management and relaxation.      Screening Interventions   Interventions Provide feedback about the scores to participant;Encouraged to exercise;To provide support and resources with identified psychosocial needs    Expected Outcomes Short Term goal: Utilizing psychosocial counselor, staff and physician to assist with identification of specific Stressors or current issues interfering with healing process. Setting desired goal for each stressor or current issue identified.;Long Term Goal: Stressors or current issues are controlled or eliminated.;Short Term goal: Identification and review with participant of any Quality of Life or Depression concerns found by scoring the questionnaire.;Long Term goal: The participant improves quality of Life and PHQ9 Scores as seen by post scores and/or verbalization of changes           Quality of Life Scores:   Quality of Life - 01/02/21 1553      Quality of Life   Select Quality of Life      Quality of Life Scores   Health/Function Pre 27.21 %    Socioeconomic Pre 30 %    Psych/Spiritual Pre 30 %    Family Pre 30 %    GLOBAL Pre 28.74 %          Scores of 19 and below usually indicate a poorer quality of life in these areas.  A difference of  2-3 points is a clinically meaningful difference.  A difference of 2-3 points in the total score of the Quality of Life Index has been associated with significant improvement in overall quality of life, self-image, physical symptoms, and general health in studies assessing change in quality  of life.  PHQ-9: Recent Review Flowsheet Data    Depression screen [facility] 2/9 01/02/2021   Decreased Interest 0   Down, Depressed, Hopeless 0   PHQ - 2 Score 0   Altered sleeping 0   Tired, decreased energy 2   Change in appetite 0   Feeling bad or failure about yourself  0   Trouble concentrating 0   Moving slowly or fidgety/restless 0   Suicidal thoughts 0   PHQ-9 Score 2   Difficult doing work/chores Not difficult at all     Interpretation of Total Score  Total Score Depression Severity:  1-4 = Minimal depression, 5-9 = Mild depression, 10-14 = Moderate depression, 15-19 = Moderately severe depression, 20-27 = Severe depression   Psychosocial Evaluation and Intervention:  Psychosocial Evaluation -  12/25/20 1420      Psychosocial Evaluation & Interventions   Interventions Encouraged to exercise with the program and follow exercise prescription    Comments Ms. Goley is feeling well post NSTEMI. She said each day is better than the last. Her biggest goal/stressor is yardwork. She wants to make sure she can mow the yard so she can help her son and she thoroughly enjoys yardwork. She wants to make sure she is strong enough to continue to do so. Her balance sometimes gets in the way so she is encouraged to work hard in [place] to strengthen her stamina. She does not report any issues with depression, anxiety, or sleep.    Expected Outcomes Short: attend cardiac rehab for education and exercise. Long: develop and maintain positive self care habits    Continue Psychosocial Services  Follow up required by staff           Psychosocial Re-Evaluation:  Psychosocial Re-Evaluation    [place] Name 01/23/21 1024             Psychosocial Re-Evaluation   Current issues with None Identified       Comments Robin reports she has been sleeping well - 1030pm-7am. She likes puzzles, read, gardening and yardwork. She has a good support sytem in her son and daughter as well as her friends. She  is close to her niece who is close to her age.       Expected Outcomes ST: continue stress reducing activities LT: continue with positive attitude       Interventions Encouraged to attend Cardiac Rehabilitation for the exercise       Continue Psychosocial Services  Follow up required by staff              Psychosocial Discharge (Final Psychosocial Re-Evaluation):  Psychosocial Re-Evaluation - 01/23/21 1024      Psychosocial Re-Evaluation   Current issues with None Identified    Comments Rolayne reports she has been sleeping well - 1030pm-7am. She likes puzzles, read, gardening and yardwork. She has a good support sytem in her son and daughter as well as her friends. She is close to her niece who is close to her age.    Expected Outcomes ST: continue stress reducing activities LT: continue with positive attitude    Interventions Encouraged to attend Cardiac Rehabilitation for the exercise    Continue Psychosocial Services  Follow up required by staff           Vocational Rehabilitation: Provide vocational rehab assistance to qualifying candidates.   Vocational Rehab Evaluation & Intervention:  Vocational Rehab - 12/25/20 1408      Initial Vocational Rehab Evaluation & Intervention   Assessment shows need for Vocational Rehabilitation No           Education: Education Goals: Education classes will be provided on a variety of topics geared toward better understanding of heart health and risk factor modification. Participant will state understanding/return demonstration of topics presented as noted by education test scores.  Learning Barriers/Preferences:  Learning Barriers/Preferences - 12/25/20 1408      Learning Barriers/Preferences   Learning Barriers None    Learning Preferences None           General Cardiac Education Topics:  AED/CPR: - Group verbal and written instruction with the use of models to demonstrate the basic use of the AED with the basic ABC's of  resuscitation.   Anatomy and Cardiac Procedures: - Group verbal and visual presentation and models  provide information about basic cardiac anatomy and function. Reviews the testing methods done to diagnose heart disease and the outcomes of the test results. Describes the treatment choices: Medical Management, Angioplasty, or Coronary Bypass Surgery for treating various heart conditions including Myocardial Infarction, Angina, Valve Disease, and Cardiac Arrhythmias.  Written material given at graduation. Flowsheet Row Cardiac Rehab from 02/27/2021 in [facility] Cardiac and Pulmonary Rehab  Date 02/20/21  Educator SB  Instruction Review Code 1- Verbalizes Understanding      Medication Safety: - Group verbal and visual instruction to review commonly prescribed medications for heart and lung disease. Reviews the medication, class of the drug, and side effects. Includes the steps to properly store meds and maintain the prescription regimen.  Written material given at graduation. Flowsheet Row Cardiac Rehab from 02/27/2021 in [facility] Cardiac and Pulmonary Rehab  Date 01/09/21  Educator [facility]  Instruction Review Code 1- Verbalizes Understanding      Intimacy: - Group verbal instruction through game format to discuss how heart and lung disease can affect sexual intimacy. Written material given at graduation..   Know Your Numbers and Heart Failure: - Group verbal and visual instruction to discuss disease risk factors for cardiac and pulmonary disease and treatment options.  Reviews associated critical values for Overweight/Obesity, Hypertension, Cholesterol, and Diabetes.  Discusses basics of heart failure: signs/symptoms and treatments.  Introduces Heart Failure Zone chart for action plan for heart failure.  Written material given at graduation. Flowsheet Row Cardiac Rehab from 02/27/2021 in [facility] Cardiac and Pulmonary Rehab  Date 01/16/21  Educator SB  Instruction Review Code 1- Verbalizes Understanding       Infection Prevention: - Provides verbal and written material to individual with discussion of infection control including proper hand washing and proper equipment cleaning during exercise session. Flowsheet Row Cardiac Rehab from 02/27/2021 in [facility] Cardiac and Pulmonary Rehab  Date 01/02/21  Educator AS  Instruction Review Code 1- Verbalizes Understanding      Falls Prevention: - Provides verbal and written material to individual with discussion of falls prevention and safety. Flowsheet Row Cardiac Rehab from 02/27/2021 in [facility] Cardiac and Pulmonary Rehab  Date 01/02/21  Educator AS  Instruction Review Code 1- Verbalizes Understanding      Other: -Provides group and verbal instruction on various topics (see comments)   Knowledge Questionnaire Score:  Knowledge Questionnaire Score - 01/02/21 1552      Knowledge Questionnaire Score   Pre Score 21/26 exercise nutrition           Core Components/Risk Factors/Patient Goals at Admission:  Personal Goals and Risk Factors at Admission - 01/02/21 1549      Core Components/Risk Factors/Patient Goals on Admission    Weight Management Yes    Intervention Weight Management: Develop a combined nutrition and exercise program designed to reach desired caloric intake, while maintaining appropriate intake of nutrient and fiber, sodium and fats, and appropriate energy expenditure required for the weight goal.;Weight Management: Provide education and appropriate resources to help participant work on and attain dietary goals.    Admit Weight 188 lb 1.6 oz (85.3 kg)    Goal Weight: Short Term 185 lb (83.9 kg)    Goal Weight: Long Term 185 lb (83.9 kg)    Expected Outcomes Weight Maintenance: Understanding of the daily nutrition guidelines, which includes 25-35% calories from fat, 7% or less cal from saturated fats, less than 200mg  cholesterol, less than 1.5gm of sodium, & 5 or more servings of fruits and vegetables daily;Understanding  recommendations  for meals to include 15-35% energy as protein, 25-35% energy from fat, 35-60% energy from carbohydrates, less than 200mg  of dietary cholesterol, 20-35 gm of total fiber daily;Understanding of distribution of calorie intake throughout the day with the consumption of 4-5 meals/snacks    Hypertension Yes    Intervention Provide education on lifestyle modifcations including regular physical activity/exercise, weight management, moderate sodium restriction and increased consumption of fresh fruit, vegetables, and low fat dairy, alcohol moderation, and smoking cessation.;Monitor prescription use compliance.    Expected Outcomes Short Term: Continued assessment and intervention until BP is < 140/1mm HG in hypertensive participants. < 130/27mm HG in hypertensive participants with diabetes, heart failure or chronic kidney disease.;Long Term: Maintenance of blood pressure at goal levels.    Lipids Yes    Intervention Provide education and support for participant on nutrition & aerobic/resistive exercise along with prescribed medications to achieve LDL 70mg , HDL >40mg .           Education:Diabetes - Individual verbal and written instruction to review signs/symptoms of diabetes, desired ranges of glucose level fasting, after meals and with exercise. Acknowledge that pre and post exercise glucose checks will be done for 3 sessions at entry of program.   Core Components/Risk Factors/Patient Goals Review:   Goals and Risk Factor Review    Row Name 01/23/21 1016 02/27/21 1000           Core Components/Risk Factors/Patient Goals Review   Personal Goals Review Weight Management/Obesity;Hypertension;Lipids Weight Management/Obesity;Hypertension      Review Raja reports since starting her weight has decreased about 1 pound - this morning she weighed 184.6lbs. She has a cuff, but does not check it at home. This am it was 142/70. She is still taking her medications as directed. Her LDL was 81  within normal range, but on the higher side of normal in January. She reports she has been making heart healthy diet changes for years. She gets the results of her heart monitor today. Shelsy said her Dr said heart monitor report was ok last time and no changes in medication.  She has a 30 day monitor on now.  She went to ER 4/30 for palpitations.  She had been working outside and was possibly dehydrated.  She reports no symptoms during exercise.  She is checking her BP on days not at [facility].      Expected Outcomes ST: on non-rehab days - check BP at home. LT: continue to monitor risk factos. Short:  follow up with Dr on monitor Long: continue to manage risk factors             Core Components/Risk Factors/Patient Goals at Discharge (Final Review):   Goals and Risk Factor Review - 02/27/21 1000      Core Components/Risk Factors/Patient Goals Review   Personal Goals Review Weight Management/Obesity;Hypertension    Review Alisa said her Dr said heart monitor report was ok last time and no changes in medication.  She has a 30 day monitor on now.  She went to ER 4/30 for palpitations.  She had been working outside and was possibly dehydrated.  She reports no symptoms during exercise.  She is checking her BP on days not at [facility].    Expected Outcomes Short:  follow up with Dr on monitor Long: continue to manage risk factors           ITP Comments:  ITP Comments    Row Name 12/25/20 1403 01/02/21 1559 01/07/21 0940 01/08/21 1501 01/15/21 1123  ITP Comments Initial telephone orientation completed. Diagnosis can be found in [facility] 1/9. EP orientation scheduled for Thursday 3/10 at 2pm. Completed 6MWT and gym orientation. Initial ITP created and sent for review to Dr. Emily Filbert, Medical Director. First full day of exercise!  Patient was oriented to gym and equipment including functions, settings, policies, and procedures.  Patient's individual exercise prescription and treatment plan were reviewed.  All  starting workloads were established based on the results of the 6 minute walk test done at initial orientation visit.  The plan for exercise progression was also introduced and progression will be customized based on patient's performance and goals. Completed initial RD evaluation 30 Day review completed. Medical Director ITP review done, changes made as directed, and signed approval by Medical Director.   [place] Name 02/12/21 [phone] 03/12/21 0956         ITP Comments 30 Day review completed. Medical Director ITP review done, changes made as directed, and signed approval by Medical Director. 30 Day review completed. Medical Director ITP review done, changes made as directed, and signed approval by Medical Director.  2 visit in [place]             Comments:

## 2021-03-13 ENCOUNTER — Other Ambulatory Visit: Payer: Self-pay

## 2021-03-13 DIAGNOSIS — I214 Non-ST elevation (NSTEMI) myocardial infarction: Secondary | ICD-10-CM | POA: Diagnosis not present

## 2021-03-13 NOTE — Progress Notes (Signed)
Daily Session Note  Patient Details  Name: Maria Trujillo MRN: [id number] Date of Birth: 1934/05/28 Referring Provider:   Flowsheet Row Cardiac Rehab from 01/02/2021 in [facility] Cardiac and Pulmonary Rehab  Referring Provider [name]      Encounter Date: 03/13/2021  Check In:  Session Check In - 03/13/21 0956      Check-In   Supervising physician immediately available to respond to emergencies See telemetry face sheet for immediately available ER MD    Location [facility]-Cardiac & Pulmonary Rehab    Staff Present Birdie Sons, MPA, RN;Melissa Caiola RDN, Rowe Pavy, BA, ACSM CEP, Exercise Physiologist    Virtual Visit No    Medication changes reported     No    Fall or balance concerns reported    No    Warm-up and Cool-down Performed on first and last piece of equipment    Resistance Training Performed Yes    VAD Patient? No    PAD/SET Patient? No      Pain Assessment   Currently in Pain? No/denies              Social History   Tobacco Use  Smoking Status Never Smoker  Smokeless Tobacco Never Used    Goals Met:  Independence with exercise equipment Exercise tolerated well No report of cardiac concerns or symptoms Strength training completed today  Goals Unmet:  Not Applicable  Comments: Pt able to follow exercise prescription today without complaint.  Will continue to monitor for progression.    Dr. Emily Filbert is Medical Director for [place] Pulmonary Rehabilitation.

## 2021-03-18 ENCOUNTER — Other Ambulatory Visit: Payer: Self-pay

## 2021-03-18 DIAGNOSIS — I214 Non-ST elevation (NSTEMI) myocardial infarction: Secondary | ICD-10-CM | POA: Diagnosis not present

## 2021-03-18 NOTE — Progress Notes (Signed)
Daily Session Note  Patient Details  Name: IVELISSE CULVERHOUSE MRN: [id number] Date of Birth: 04/05/1934 Referring Provider:   Flowsheet Row Cardiac Rehab from 01/02/2021 in [facility] Cardiac and Pulmonary Rehab  Referring Provider [name]      Encounter Date: 03/18/2021  Check In:  Session Check In - 03/18/21 0958      Check-In   Supervising physician immediately available to respond to emergencies See telemetry face sheet for immediately available ER MD    Location [facility]-Cardiac & Pulmonary Rehab    Staff Present Birdie Sons, MPA, RN;Amanda Oletta Darter, BA, ACSM CEP, Exercise Physiologist;Kara Eliezer Bottom, MS, ASCM CEP, Exercise Physiologist    Virtual Visit No    Medication changes reported     No    Fall or balance concerns reported    No    Warm-up and Cool-down Performed on first and last piece of equipment    Resistance Training Performed Yes    VAD Patient? No    PAD/SET Patient? No      Pain Assessment   Currently in Pain? No/denies              Social History   Tobacco Use  Smoking Status Never Smoker  Smokeless Tobacco Never Used    Goals Met:  Independence with exercise equipment Exercise tolerated well No report of cardiac concerns or symptoms Strength training completed today  Goals Unmet:  Not Applicable  Comments: Pt able to follow exercise prescription today without complaint.  Will continue to monitor for progression.    Dr. Emily Filbert is Medical Director for [facility].  Dr. Ottie Glazier is Medical Director for [facility] Pulmonary Rehabilitation.

## 2021-03-20 ENCOUNTER — Other Ambulatory Visit: Payer: Self-pay

## 2021-03-20 DIAGNOSIS — I214 Non-ST elevation (NSTEMI) myocardial infarction: Secondary | ICD-10-CM | POA: Diagnosis not present

## 2021-03-20 NOTE — Progress Notes (Signed)
Daily Session Note  Patient Details  Name: Maria Trujillo MRN: [id number] Date of Birth: [birth year] Referring Provider:   Flowsheet Row Cardiac Rehab from 01/02/2021 in [facility] Cardiac and Pulmonary Rehab  Referring Provider [name]      Encounter Date: 03/20/2021  Check In:  Session Check In - 03/20/21 2284      Check-In   Supervising physician immediately available to respond to emergencies See telemetry face sheet for immediately available ER MD    Location [facility]-Cardiac & Pulmonary Rehab    Staff Present Birdie Sons, MPA, RN;Melissa Caiola RDN, Rowe Pavy, BA, ACSM CEP, Exercise Physiologist    Virtual Visit No    Medication changes reported     No    Fall or balance concerns reported    No    Warm-up and Cool-down Performed on first and last piece of equipment    Resistance Training Performed Yes    VAD Patient? No    PAD/SET Patient? No      Pain Assessment   Currently in Pain? No/denies              Social History   Tobacco Use  Smoking Status Never Smoker  Smokeless Tobacco Never Used    Goals Met:  Independence with exercise equipment Exercise tolerated well No report of cardiac concerns or symptoms Strength training completed today  Goals Unmet:  Not Applicable  Comments: Pt able to follow exercise prescription today without complaint.  Will continue to monitor for progression.    Dr. Emily Filbert is Medical Director for [place].  Dr. Ottie Glazier is Medical Director for [facility] Pulmonary Rehabilitation.

## 2021-03-22 ENCOUNTER — Other Ambulatory Visit: Payer: Self-pay

## 2021-03-22 ENCOUNTER — Emergency Department
Admission: EM | Admit: 2021-03-22 | Discharge: 2021-03-22 | Disposition: A | Discharge status: Home / Self Care | Payer: PPO | Attending: Emergency Medicine | Admitting: Emergency Medicine

## 2021-03-22 ENCOUNTER — Encounter: Payer: Self-pay | Admitting: Intensive Care

## 2021-03-22 ENCOUNTER — Emergency Department: Payer: PPO

## 2021-03-22 DIAGNOSIS — I4891 Unspecified atrial fibrillation: Secondary | ICD-10-CM | POA: Insufficient documentation

## 2021-03-22 DIAGNOSIS — Z7901 Long term (current) use of anticoagulants: Secondary | ICD-10-CM | POA: Insufficient documentation

## 2021-03-22 DIAGNOSIS — Z7982 Long term (current) use of aspirin: Secondary | ICD-10-CM | POA: Insufficient documentation

## 2021-03-22 DIAGNOSIS — I10 Essential (primary) hypertension: Secondary | ICD-10-CM | POA: Insufficient documentation

## 2021-03-22 DIAGNOSIS — Z79899 Other long term (current) drug therapy: Secondary | ICD-10-CM | POA: Insufficient documentation

## 2021-03-22 DIAGNOSIS — R002 Palpitations: Secondary | ICD-10-CM | POA: Diagnosis present

## 2021-03-22 DIAGNOSIS — E039 Hypothyroidism, unspecified: Secondary | ICD-10-CM | POA: Insufficient documentation

## 2021-03-22 DIAGNOSIS — Z96653 Presence of artificial knee joint, bilateral: Secondary | ICD-10-CM | POA: Diagnosis not present

## 2021-03-22 DIAGNOSIS — R000 Tachycardia, unspecified: Secondary | ICD-10-CM | POA: Diagnosis not present

## 2021-03-22 LAB — CBC
HCT: 35.7 % — ABNORMAL LOW (ref 36.0–46.0)
Hemoglobin: 12.1 g/dL (ref 12.0–15.0)
MCH: 30.3 pg (ref 26.0–34.0)
MCHC: 33.9 g/dL (ref 30.0–36.0)
MCV: 89.3 fL (ref 80.0–100.0)
Platelets: 149 10*3/uL — ABNORMAL LOW (ref 150–400)
RBC: 4 MIL/uL (ref 3.87–5.11)
RDW: 13.2 % (ref 11.5–15.5)
WBC: 5.9 10*3/uL (ref 4.0–10.5)
nRBC: 0 % (ref 0.0–0.2)

## 2021-03-22 LAB — BASIC METABOLIC PANEL
Anion gap: 9 (ref 5–15)
BUN: 18 mg/dL (ref 8–23)
CO2: 23 mmol/L (ref 22–32)
Calcium: 9.4 mg/dL (ref 8.9–10.3)
Chloride: 108 mmol/L (ref 98–111)
Creatinine, Ser: 0.51 mg/dL (ref 0.44–1.00)
GFR, Estimated: >60 mL/min (ref >60)
Glucose, Bld: 131 mg/dL — ABNORMAL HIGH (ref 70–99)
Potassium: 3.7 mmol/L (ref 3.5–5.1)
Sodium: 140 mmol/L (ref 135–145)

## 2021-03-22 LAB — TROPONIN I (HIGH SENSITIVITY): Troponin I (High Sensitivity): 21 ng/L — ABNORMAL HIGH (ref <18)

## 2021-03-22 MED ORDER — APIXABAN 5 MG PO TABS
5.0000 mg | ORAL_TABLET | Freq: Two times a day (BID) | ORAL | Status: DC
  Administered 2021-03-22: 5 mg via ORAL
  Filled 2021-03-22: qty 1

## 2021-03-22 MED ORDER — METOPROLOL TARTRATE 25 MG PO TABS: 25.0000 mg | ORAL_TABLET | Freq: Two times a day (BID) | ORAL | 2 refills | Status: DC

## 2021-03-22 MED ORDER — APIXABAN 5 MG PO TABS: 5.0000 mg | ORAL_TABLET | Freq: Two times a day (BID) | ORAL | 1 refills | Status: DC

## 2021-03-22 MED ORDER — METOPROLOL TARTRATE 25 MG PO TABS
25.0000 mg | ORAL_TABLET | Freq: Once | ORAL | Status: AC
  Administered 2021-03-22: 25 mg via ORAL
  Filled 2021-03-22: qty 1

## 2021-03-22 NOTE — ED Notes (Signed)
Patient transported to X-ray 

## 2021-03-22 NOTE — ED Provider Notes (Signed)
[facility] Emergency Department Provider Note   ____________________________________________    I have reviewed the triage vital signs and the nursing notes.   HISTORY  Chief Complaint Tachycardia     HPI Maria Trujillo is a 87 y.o. female who presents with complaints of palpitations.  Patient reports that approximately 2:30 AM she was awoken with heart palpitations.  She reports she took a full metoprolol as she has been instructed by her cardiologist.  Briefly patient had an NSTEMI in January of this year, 2 months later had an episode of palpitations which brought her to the emergency department.  She is currently wearing a 30-day monitor, does see Dr. Saralyn Pilar of cardiology.  She denies chest pain at this time.  No shortness of breath.  She is not on blood thinners.  Past Medical History:  Diagnosis Date  . Arthritis   . History of kidney stones   . Hypothyroidism   . Myocardial infarct ([place])   . PONV (postoperative nausea and vomiting)     Patient Active Problem List   Diagnosis Date Noted  . Acquired hypothyroidism 12/25/2020  . Nontraumatic complete tear of right rotator cuff 12/25/2020  . NSTEMI (non-ST elevated myocardial infarction) ([place]) 11/03/2020  . Primary open angle glaucoma (POAG) of both eyes, severe stage 11/01/2018  . Primary osteoarthritis involving multiple joints 02/18/2017  . Esophageal reflux 02/15/2017  . HTN (hypertension) 02/15/2017  . S/P total knee arthroplasty 02/15/2017  . Hyperlipidemia, unspecified 03/06/2014  . Restless leg syndrome 03/06/2014    Past Surgical History:  Procedure Laterality Date  . BREAST EXCISIONAL BIOPSY Bilateral 1984   benign  . CATARACT EXTRACTION, BILATERAL    . CHOLECYSTECTOMY    . JOINT REPLACEMENT Left 12/2012   knee  . KNEE ARTHROPLASTY Right 02/15/2017   Procedure: COMPUTER ASSISTED TOTAL KNEE ARTHROPLASTY;  Surgeon: Dereck Leep, MD;  Location: [facility];  Service:  Orthopedics;  Laterality: Right;  . LEFT HEART CATH AND CORONARY ANGIOGRAPHY N/A 11/04/2020   Procedure: LEFT HEART CATH AND CORONARY ANGIOGRAPHY;  Surgeon: Isaias Cowman, MD;  Location: [facility];  Service: Cardiovascular;  Laterality: N/A;  . PHOTOCOAGULATION WITH LASER Right 04/20/2018   Procedure: TRANSCLERAL DIODE CYCLOPHOTOCOAGULATION  RIGHT per [place] block;  Surgeon: Leandrew Koyanagi, MD;  Location: [facility];  Service: Ophthalmology;  Laterality: Right;  . ROTATOR CUFF REPAIR Left 2010  . TONSILLECTOMY      Prior to Admission medications   Medication Sig Start Date End Date Taking? Authorizing Provider  apixaban (ELIQUIS) 5 MG TABS tablet Take 1 tablet (5 mg total) by mouth 2 (two) times daily. 03/22/21 05/21/21 Yes Lavonia Drafts, MD  acetaminophen (TYLENOL) 650 MG CR tablet Take 650 mg by mouth daily as needed for pain.    [provider]  aspirin 81 MG tablet Take 81 mg by mouth at bedtime.    [provider]  atorvastatin (LIPITOR) 80 MG tablet Take 1 tablet (80 mg total) by mouth at bedtime. 11/05/20 02/03/21  Enzo Bi, MD  bimatoprost (LUMIGAN) 0.03 % ophthalmic solution Place 1 drop into both eyes at bedtime.    [provider]  Calcium Carb-Cholecalciferol (CALCIUM CARBONATE-VITAMIN D3) 600-400 MG-UNIT TABS Take 1 tablet by mouth daily.    [provider]  cholecalciferol (VITAMIN D) 1000 units tablet Take 1,000 Units by mouth daily.    [provider]  fexofenadine (ALLEGRA) 180 MG tablet Take 180 mg by mouth daily as needed for allergies  or rhinitis.    [provider]  isosorbide mononitrate (IMDUR) 30 MG 24 hr tablet Take 1 tablet (30 mg total) by mouth daily. 11/06/20 02/04/21  Enzo Bi, MD  levothyroxine (SYNTHROID, LEVOTHROID) 100 MCG tablet Take 100 mcg by mouth daily before breakfast.    [provider]  lisinopril (ZESTRIL) 5 MG tablet Take 1 tablet (5 mg total) by mouth daily.  11/06/20 02/04/21  Enzo Bi, MD  metoprolol tartrate (LOPRESSOR) 25 MG tablet Take 1 tablet (25 mg total) by mouth 2 (two) times daily. 03/22/21 06/20/21  Lavonia Drafts, MD  Multiple Vitamin (MULTIVITAMIN WITH MINERALS) TABS tablet Take 1 tablet by mouth daily.    [provider]  Multiple Vitamins-Minerals (PRESERVISION AREDS 2 PO) Take 2 tablets by mouth 2 (two) times daily.    [provider]  Omega-3 Fatty Acids (FISH OIL OMEGA-3) 1000 MG CAPS Take 1,000 mg by mouth daily.    [provider]  timolol (TIMOPTIC) 0.5 % ophthalmic solution Place 1 drop into both eyes daily. 10/28/20   [provider]     Allergies Dorzolamide, Celecoxib, Penicillins, Oxycodone, Shellfish allergy, Sulfa antibiotics, Amoxicillin, Amoxicillin-pot clavulanate, Brimonidine, Cefuroxime axetil, Dipivefrin, and Timolol maleate  Family History  Problem Relation Age of Onset  . Breast cancer Sister 73    Social History Social History   Tobacco Use  . Smoking status: Never Smoker  . Smokeless tobacco: Never Used  Vaping Use  . Vaping Use: Never used  Substance Use Topics  . Alcohol use: No  . Drug use: No    Review of Systems  Constitutional: No fever/chills Eyes: No visual changes.  ENT: No sore throat. Cardiovascular: As above Respiratory: Denies shortness of breath. Gastrointestinal: No abdominal pain.  No nausea, no vomiting.   Genitourinary: Negative for dysuria. Musculoskeletal: Negative for back pain. Skin: Negative for rash. Neurological: Negative for headaches or weakness   ____________________________________________   PHYSICAL EXAM:  VITAL SIGNS: ED Triage Vitals  Enc Vitals Group     BP 03/22/21 0850 (!) 135/96     Pulse Rate 03/22/21 0850 (!) 134     Resp 03/22/21 0850 20     Temp 03/22/21 0850 97.8 F (36.6 C)     Temp Source 03/22/21 0850 Oral     SpO2 03/22/21 0850 96 %     Weight 03/22/21 0852 81.6 kg (180 lb)     Height 03/22/21 0852  1.626 m (5\' 4" )     Head Circumference --      Peak Flow --      Pain Score 03/22/21 0851 0     Pain Loc --      Pain Edu? --      Excl. in [place]? --     Constitutional: Alert and oriented.   Nose: No congestion/rhinnorhea. Mouth/Throat: Mucous membranes are moist.   Neck:  Painless ROM Cardiovascular: Tachycardia, irregular rhythm. Grossly normal heart sounds.  Good peripheral circulation. Respiratory: Normal respiratory effort.  No retractions. Lungs CTAB. Gastrointestinal: Soft and nontender. No distention.  No CVA tenderness.  Musculoskeletal: No lower extremity tenderness nor edema.  Warm and well perfused Neurologic:  Normal speech and language. No gross focal neurologic deficits are appreciated.  Skin:  Skin is warm, dry and intact. No rash noted. Psychiatric: Mood and affect are normal. Speech and behavior are normal.  ____________________________________________   LABS (all labs ordered are listed, but only abnormal results are displayed)  Labs Reviewed  BASIC METABOLIC PANEL - Abnormal; Notable  for the following components:      Result Value   Glucose, Bld 131 (*)    All other components within normal limits  CBC - Abnormal; Notable for the following components:   HCT 35.7 (*)    Platelets 149 (*)    All other components within normal limits  TROPONIN I (HIGH SENSITIVITY) - Abnormal; Notable for the following components:   Troponin I (High Sensitivity) 21 (*)    All other components within normal limits   ____________________________________________  EKG  ED ECG REPORT I, Lavonia Drafts, the attending physician, personally viewed and interpreted this ECG.  Date: 03/22/2021  Rhythm: Atrial fibrillation with RVR QRS Axis: normal Intervals: Abnormal ST/T Wave abnormalities: Nonspecific changes Narrative Interpretation: Atrial fibrillation with RVR  ____________________________________________  RADIOLOGY  Chest x-ray reviewed by me, no acute  abnormality ____________________________________________   PROCEDURES  Procedure(s) performed: yes  .1-3 Lead EKG Interpretation Performed by: Lavonia Drafts, MD Authorized by: Lavonia Drafts, MD     Interpretation: abnormal     ECG rate assessment: tachycardic     Rhythm: atrial fibrillation     Conduction: normal       Critical Care performed: No ____________________________________________   INITIAL IMPRESSION / ASSESSMENT AND PLAN / ED COURSE  Pertinent labs & imaging results that were available during my care of the patient were reviewed by me and considered in my medical decision making (see chart for details).  Patient presents with palpitations as noted above, EKG is consistent with atrial fibrillation with RVR patient overall well-appearing, blood pressure is normal.  Pending labs, chest x-ray, will discuss with Dr. Saralyn Pilar of cardiology  Dr. Saralyn Pilar recommends increasing metoprolol to 25 mg twice daily and starting Eliquis and he will follow up with the patient in his office  Discussed plan with patient she agrees, return precautions discussed    ____________________________________________   FINAL CLINICAL IMPRESSION(S) / ED DIAGNOSES  Final diagnoses:  Atrial fibrillation with RVR ([place])        Note:  This document was prepared using Dragon voice recognition software and may include unintentional dictation errors.   Lavonia Drafts, MD 03/22/21 1126

## 2021-03-22 NOTE — ED Triage Notes (Signed)
Patient c/o "fast heart beat" waking her up from her sleep this AM. Denies chest pain. Currently wearing a 30 day heart monitor due to similar c/o almost a month a go

## 2021-03-25 ENCOUNTER — Encounter: Payer: Self-pay | Admitting: *Deleted

## 2021-03-25 DIAGNOSIS — I214 Non-ST elevation (NSTEMI) myocardial infarction: Secondary | ICD-10-CM

## 2021-03-26 DIAGNOSIS — E7800 Pure hypercholesterolemia, unspecified: Secondary | ICD-10-CM | POA: Diagnosis not present

## 2021-03-26 DIAGNOSIS — R002 Palpitations: Secondary | ICD-10-CM | POA: Diagnosis not present

## 2021-03-26 DIAGNOSIS — I480 Paroxysmal atrial fibrillation: Secondary | ICD-10-CM | POA: Diagnosis not present

## 2021-03-26 DIAGNOSIS — I2510 Atherosclerotic heart disease of native coronary artery without angina pectoris: Secondary | ICD-10-CM | POA: Diagnosis not present

## 2021-03-26 DIAGNOSIS — I214 Non-ST elevation (NSTEMI) myocardial infarction: Secondary | ICD-10-CM | POA: Diagnosis not present

## 2021-03-28 DIAGNOSIS — Z03818 Encounter for observation for suspected exposure to other biological agents ruled out: Secondary | ICD-10-CM | POA: Diagnosis not present

## 2021-03-28 DIAGNOSIS — J019 Acute sinusitis, unspecified: Secondary | ICD-10-CM | POA: Diagnosis not present

## 2021-03-28 DIAGNOSIS — R5383 Other fatigue: Secondary | ICD-10-CM | POA: Diagnosis not present

## 2021-03-28 DIAGNOSIS — R42 Dizziness and giddiness: Secondary | ICD-10-CM | POA: Diagnosis not present

## 2021-03-28 DIAGNOSIS — B9689 Other specified bacterial agents as the cause of diseases classified elsewhere: Secondary | ICD-10-CM | POA: Diagnosis not present

## 2021-04-01 ENCOUNTER — Encounter: Payer: Self-pay | Admitting: *Deleted

## 2021-04-01 ENCOUNTER — Telehealth: Payer: Self-pay

## 2021-04-01 DIAGNOSIS — I214 Non-ST elevation (NSTEMI) myocardial infarction: Secondary | ICD-10-CM

## 2021-04-01 NOTE — Telephone Encounter (Signed)
Patient called out of rehab- does not feel well and  currently staying having symptoms. Tested negative for COVID, will test again later on. Ensure patient needs to be symptom free and negative before returning back. Patient understood.

## 2021-04-03 ENCOUNTER — Telehealth: Payer: Self-pay

## 2021-04-03 NOTE — Telephone Encounter (Signed)
Feeling better and plans to return next week

## 2021-04-08 ENCOUNTER — Encounter: Payer: PPO | Attending: Cardiology

## 2021-04-08 ENCOUNTER — Other Ambulatory Visit: Payer: Self-pay

## 2021-04-08 DIAGNOSIS — I214 Non-ST elevation (NSTEMI) myocardial infarction: Secondary | ICD-10-CM | POA: Diagnosis not present

## 2021-04-08 NOTE — Progress Notes (Signed)
Daily Session Note  Patient Details  Name: Maria Trujillo MRN: [id number] Date of Birth: Aug 30, 1934 Referring Provider:   Flowsheet Row Cardiac Rehab from 01/02/2021 in [facility]  Referring Provider [name]       Encounter Date: 04/08/2021  Check In:  Session Check In - 04/08/21 0942       Check-In   Supervising physician immediately available to respond to emergencies See telemetry face sheet for immediately available ER MD    Location [facility]    Staff Present Birdie Sons, MPA, RN;Laureen Owens Shark, BS, RRT, CPFT;Kara Eliezer Bottom, MS, ASCM CEP, Exercise Physiologist    Virtual Visit No    Medication changes reported     Yes    Comments added Eliquis 19m; discontinued Plavix    Fall or balance concerns reported    No    Warm-up and Cool-down Performed on first and last piece of equipment    Resistance Training Performed Yes    VAD Patient? No    PAD/SET Patient? No      Pain Assessment   Currently in Pain? No/denies                Social History   Tobacco Use  Smoking Status Never  Smokeless Tobacco Never    Goals Met:  Independence with exercise equipment Exercise tolerated well Personal goals reviewed No report of cardiac concerns or symptoms Strength training completed today  Goals Unmet:  Not Applicable  Comments: Pt returns with medical clearance (see note dated 03/26/21 in patient chart) from her cardiologist to return to cardiac rehab without restriction. Pt able to follow exercise prescription today without complaint.  Will continue to monitor for progression.   Dr. MEmily Filbertis Medical Director for [facility]  Dr. FOttie Glazieris Medical Director for [facility].

## 2021-04-09 ENCOUNTER — Encounter: Payer: Self-pay | Admitting: *Deleted

## 2021-04-09 DIAGNOSIS — I214 Non-ST elevation (NSTEMI) myocardial infarction: Secondary | ICD-10-CM

## 2021-04-09 DIAGNOSIS — I2510 Atherosclerotic heart disease of native coronary artery without angina pectoris: Secondary | ICD-10-CM | POA: Diagnosis not present

## 2021-04-09 DIAGNOSIS — I480 Paroxysmal atrial fibrillation: Secondary | ICD-10-CM | POA: Diagnosis not present

## 2021-04-09 DIAGNOSIS — E7800 Pure hypercholesterolemia, unspecified: Secondary | ICD-10-CM | POA: Diagnosis not present

## 2021-04-09 DIAGNOSIS — I10 Essential (primary) hypertension: Secondary | ICD-10-CM | POA: Diagnosis not present

## 2021-04-09 DIAGNOSIS — R002 Palpitations: Secondary | ICD-10-CM | POA: Diagnosis not present

## 2021-04-09 DIAGNOSIS — R001 Bradycardia, unspecified: Secondary | ICD-10-CM | POA: Diagnosis not present

## 2021-04-09 NOTE — Progress Notes (Signed)
Cardiac Individual Treatment Plan  Patient Details  Name: Maria Trujillo MRN: [id number] Date of Birth: June 06, 1934 Referring Provider:   Flowsheet Row Cardiac Rehab from 01/02/2021 in [facility] Cardiac and Pulmonary Rehab  Referring Provider [name]       Initial Encounter Date:  Flowsheet Row Cardiac Rehab from 01/02/2021 in [facility] Cardiac and Pulmonary Rehab  Date 01/02/21       Visit Diagnosis: NSTEMI (non-ST elevated myocardial infarction) [facility])  Patient's Home Medications on Admission:  Current Outpatient Medications:    acetaminophen (TYLENOL) 650 MG CR tablet, Take 650 mg by mouth daily as needed for pain., Disp: , Rfl:    apixaban (ELIQUIS) 5 MG TABS tablet, Take 1 tablet (5 mg total) by mouth 2 (two) times daily., Disp: 60 tablet, Rfl: 1   aspirin 81 MG tablet, Take 81 mg by mouth at bedtime., Disp: , Rfl:    atorvastatin (LIPITOR) 80 MG tablet, Take 1 tablet (80 mg total) by mouth at bedtime., Disp: 30 tablet, Rfl: 2   bimatoprost (LUMIGAN) 0.03 % ophthalmic solution, Place 1 drop into both eyes at bedtime., Disp: , Rfl:    Calcium Carb-Cholecalciferol (CALCIUM CARBONATE-VITAMIN D3) 600-400 MG-UNIT TABS, Take 1 tablet by mouth daily., Disp: , Rfl:    cholecalciferol (VITAMIN D) 1000 units tablet, Take 1,000 Units by mouth daily., Disp: , Rfl:    fexofenadine (ALLEGRA) 180 MG tablet, Take 180 mg by mouth daily as needed for allergies or rhinitis., Disp: , Rfl:    isosorbide mononitrate (IMDUR) 30 MG 24 hr tablet, Take 1 tablet (30 mg total) by mouth daily., Disp: 30 tablet, Rfl: 2   levothyroxine (SYNTHROID, LEVOTHROID) 100 MCG tablet, Take 100 mcg by mouth daily before breakfast., Disp: , Rfl:    lisinopril (ZESTRIL) 5 MG tablet, Take 1 tablet (5 mg total) by mouth daily., Disp: 30 tablet, Rfl: 2   metoprolol tartrate (LOPRESSOR) 25 MG tablet, Take 1 tablet (25 mg total) by mouth 2 (two) times daily., Disp: 60 tablet, Rfl: 2   Multiple Vitamin (MULTIVITAMIN WITH MINERALS) TABS  tablet, Take 1 tablet by mouth daily., Disp: , Rfl:    Multiple Vitamins-Minerals (PRESERVISION AREDS 2 PO), Take 2 tablets by mouth 2 (two) times daily., Disp: , Rfl:    Omega-3 Fatty Acids (FISH OIL OMEGA-3) 1000 MG CAPS, Take 1,000 mg by mouth daily., Disp: , Rfl:    timolol (TIMOPTIC) 0.5 % ophthalmic solution, Place 1 drop into both eyes daily., Disp: , Rfl:   Past Medical History: Past Medical History:  Diagnosis Date   Arthritis    History of kidney stones    Hypothyroidism    Myocardial infarct (HCC)    PONV (postoperative nausea and vomiting)     Tobacco Use: Social History   Tobacco Use  Smoking Status Never  Smokeless Tobacco Never    Labs: Recent Review Flowsheet Data     Labs for ITP Cardiac and Pulmonary Rehab Latest Ref Rng & Units 11/04/2020   Cholestrol 0 - 200 mg/dL 150   LDLCALC 0 - 99 mg/dL 81   HDL >40 mg/dL 60   Trlycerides <150 mg/dL 45        Exercise Target Goals: Exercise Program Goal: Individual exercise prescription set using results from initial 6 min walk test and THRR while considering  patient's activity barriers and safety.   Exercise Prescription Goal: Initial exercise prescription builds to 30-45 minutes a day of aerobic activity, 2-3 days per week.  Home exercise guidelines will be given  to patient during program as part of exercise prescription that the participant will acknowledge.   Education: Aerobic Exercise: - Group verbal and visual presentation on the components of exercise prescription. Introduces F.I.T.T principle from ACSM for exercise prescriptions.  Reviews F.I.T.T. principles of aerobic exercise including progression. Written material given at graduation.   Education: Resistance Exercise: - Group verbal and visual presentation on the components of exercise prescription. Introduces F.I.T.T principle from ACSM for exercise prescriptions  Reviews F.I.T.T. principles of resistance exercise including progression. Written  material given at graduation. Flowsheet Row Cardiac Rehab from 03/20/2021 in [facility] Cardiac and Pulmonary Rehab  Date 02/20/21  Educator [facility]  Instruction Review Code 1- Verbalizes Understanding        Education: Exercise & Equipment Safety: - Individual verbal instruction and demonstration of equipment use and safety with use of the equipment. Flowsheet Row Cardiac Rehab from 03/20/2021 in [facility] Cardiac and Pulmonary Rehab  Date 01/02/21  Educator AS  Instruction Review Code 1- Verbalizes Understanding       Education: Exercise Physiology & General Exercise Guidelines: - Group verbal and written instruction with models to review the exercise physiology of the cardiovascular system and associated critical values. Provides general exercise guidelines with specific guidelines to those with heart or lung disease.    Education: Flexibility, Balance, Mind/Body Relaxation: - Group verbal and visual presentation with interactive activity on the components of exercise prescription. Introduces F.I.T.T principle from ACSM for exercise prescriptions. Reviews F.I.T.T. principles of flexibility and balance exercise training including progression. Also discusses the mind body connection.  Reviews various relaxation techniques to help reduce and manage stress (i.e. Deep breathing, progressive muscle relaxation, and visualization). Balance handout provided to take home. Written material given at graduation. Flowsheet Row Cardiac Rehab from 03/20/2021 in [facility] Cardiac and Pulmonary Rehab  Date 02/27/21  Educator AS  Instruction Review Code 1- Verbalizes Understanding       Activity Barriers & Risk Stratification:  Activity Barriers & Cardiac Risk Stratification - 12/25/20 1403       Activity Barriers & Cardiac Risk Stratification   Activity Barriers Left Knee Replacement;Right Knee Replacement;Other (comment);Assistive Device;Balance Concerns    Comments both shoulders are "bad"    Cardiac Risk  Stratification Moderate             6 Minute Walk:  6 Minute Walk     Row Name 01/02/21 1545         6 Minute Walk   Phase Initial     Distance 700 feet     Walk Time 6 minutes     # of Rest Breaks 0     MPH 1.3     METS 0.73     RPE 11     Perceived Dyspnea  1     VO2 Peak 2.57     Resting HR 54 bpm     Resting BP 106/58     Resting Oxygen Saturation  99 %     Exercise Oxygen Saturation  during 6 min walk 98 %     Max Ex. HR 103 bpm     Max Ex. BP 128/60     2 Minute Post BP 104/58              Oxygen Initial Assessment:   Oxygen Re-Evaluation:   Oxygen Discharge (Final Oxygen Re-Evaluation):   Initial Exercise Prescription:  Initial Exercise Prescription - 01/02/21 1500       Date of Initial Exercise RX and Referring Provider  Date 01/02/21    Referring Provider [name]      Treadmill   MPH 1    Grade 0    Minutes 15    METs 1.77      Recumbant Bike   Level 1    RPM 60    Minutes 15    METs 1      NuStep   Level 1    SPM 80    Minutes 15    METs 1      REL-XR   Level 1    Speed 50    Minutes 15    METs 1      Prescription Details   Frequency (times per week) 2    Duration Progress to 30 minutes of continuous aerobic without signs/symptoms of physical distress      Intensity   THRR 40-80% of Max Heartrate 87-119    Ratings of Perceived Exertion 11-13    Perceived Dyspnea 0-4      Resistance Training   Training Prescription Yes    Weight 2 lb    Reps 10-15             Perform Capillary Blood Glucose checks as needed.  Exercise Prescription Changes:   Exercise Prescription Changes     Row Name 01/02/21 1500 01/21/21 1200 02/03/21 1300 02/18/21 1200 03/04/21 1400     Response to Exercise   Blood Pressure (Admit) 106/58 132/54 142/72 130/62 148/70   Blood Pressure (Exercise) 128/60 122/60 130/70 138/70 124/68   Blood Pressure (Exit) 104/58 114/60 120/62 130/62 116/60   Heart Rate (Admit) 54 bpm 51 bpm 67  bpm 45 bpm 55 bpm   Heart Rate (Exercise) 103 bpm 76 bpm 69 bpm 72 bpm 82 bpm   Heart Rate (Exit) 54 bpm 72 bpm 52 bpm 56 bpm 60 bpm   Oxygen Saturation (Admit) 98 % -- -- -- --   Oxygen Saturation (Exercise) 99 % -- -- -- --   Rating of Perceived Exertion (Exercise) 11 12 13 13 13    Perceived Dyspnea (Exercise) 1 -- -- -- --   Symptoms none none none none none   Duration -- Continue with 30 min of aerobic exercise without signs/symptoms of physical distress. Continue with 30 min of aerobic exercise without signs/symptoms of physical distress. Continue with 30 min of aerobic exercise without signs/symptoms of physical distress. Continue with 30 min of aerobic exercise without signs/symptoms of physical distress.   Intensity -- THRR unchanged THRR unchanged THRR unchanged THRR unchanged     Progression   Progression -- Continue to progress workloads to maintain intensity without signs/symptoms of physical distress. Continue to progress workloads to maintain intensity without signs/symptoms of physical distress. Continue to progress workloads to maintain intensity without signs/symptoms of physical distress. Continue to progress workloads to maintain intensity without signs/symptoms of physical distress.   Average METs -- 2 2 1.8 2.1     Resistance Training   Training Prescription -- Yes Yes Yes Yes   Weight -- 2 lb 2 lb 2 lb 2 lb   Reps -- 10-15 10-15 10-15 10-15     Interval Training   Interval Training -- -- No No No     Treadmill   MPH -- -- -- -- 1.3   Grade -- -- -- -- 0   Minutes -- -- -- -- 15   METs -- -- -- -- 2     NuStep   Level -- 1 3 -- --  Minutes -- 30 30 -- --   METs -- 2 2.2  highest reported -- --     T5 Nustep   Level -- -- -- -- 1   Minutes -- -- -- -- 15   METs -- -- -- -- 1.9     Home Exercise Plan   Plans to continue exercise at -- -- Home (comment)  walking Home (comment)  walking Home (comment)  walking   Frequency -- -- Add 2 additional days to  program exercise sessions. Add 2 additional days to program exercise sessions. Add 2 additional days to program exercise sessions.   Initial Home Exercises Provided -- -- 01/30/21 01/30/21 01/30/21    Row Name 03/17/21 1200             Response to Exercise   Blood Pressure (Admit) 120/60       Blood Pressure (Exercise) 148/64       Blood Pressure (Exit) 120/60       Heart Rate (Admit) 56 bpm       Heart Rate (Exercise) 83 bpm       Heart Rate (Exit) 55 bpm       Rating of Perceived Exertion (Exercise) 14       Symptoms none       Duration Continue with 30 min of aerobic exercise without signs/symptoms of physical distress.       Intensity THRR unchanged               Progression     Progression Continue to progress workloads to maintain intensity without signs/symptoms of physical distress.       Average METs 2               Resistance Training     Training Prescription Yes       Weight 2 lb       Reps 10-15               Treadmill     MPH 1.4       Grade 0       Minutes 15       METs 2               Exercise Comments:   Exercise Comments     Row Name 01/07/21 0940 04/08/21 0959         Exercise Comments First full day of exercise!  Patient was oriented to gym and equipment including functions, settings, policies, and procedures.  Patient's individual exercise prescription and treatment plan were reviewed.  All starting workloads were established based on the results of the 6 minute walk test done at initial orientation visit.  The plan for exercise progression was also introduced and progression will be customized based on patient's performance and goals. Pt returns with medical clearance (see note dated 03/26/21 in chart) from her cardiologist to return to cardiac rehab without restriction. Pt able to follow exercise prescription today without complaint.  Will continue to monitor for progression.               Exercise Goals and Review:   Exercise Goals      Row Name 01/02/21 1548             Exercise Goals   Increase Physical Activity Yes       Intervention Provide advice, education, support and counseling about physical activity/exercise needs.;Develop an individualized exercise prescription for aerobic and resistive training based on initial evaluation findings,  risk stratification, comorbidities and participant's personal goals.       Expected Outcomes Short Term: Attend rehab on a regular basis to increase amount of physical activity.;Long Term: Add in home exercise to make exercise part of routine and to increase amount of physical activity.;Long Term: Exercising regularly at least 3-5 days a week.       Increase Strength and Stamina Yes       Intervention Provide advice, education, support and counseling about physical activity/exercise needs.;Develop an individualized exercise prescription for aerobic and resistive training based on initial evaluation findings, risk stratification, comorbidities and participant's personal goals.       Expected Outcomes Short Term: Increase workloads from initial exercise prescription for resistance, speed, and METs.;Long Term: Improve cardiorespiratory fitness, muscular endurance and strength as measured by increased METs and functional capacity (6MWT);Short Term: Perform resistance training exercises routinely during rehab and add in resistance training at home       Able to understand and use rate of perceived exertion (RPE) scale Yes       Intervention Provide education and explanation on how to use RPE scale       Expected Outcomes Short Term: Able to use RPE daily in rehab to express subjective intensity level;Long Term:  Able to use RPE to guide intensity level when exercising independently       Able to understand and use Dyspnea scale Yes       Intervention Provide education and explanation on how to use Dyspnea scale       Expected Outcomes Short Term: Able to use Dyspnea scale daily in rehab to  express subjective sense of shortness of breath during exertion;Long Term: Able to use Dyspnea scale to guide intensity level when exercising independently       Knowledge and understanding of Target Heart Rate Range (THRR) Yes       Intervention Provide education and explanation of THRR including how the numbers were predicted and where they are located for reference       Expected Outcomes Short Term: Able to state/look up THRR;Short Term: Able to use daily as guideline for intensity in rehab;Long Term: Able to use THRR to govern intensity when exercising independently       Able to check pulse independently Yes       Intervention Provide education and demonstration on how to check pulse in carotid and radial arteries.;Review the importance of being able to check your own pulse for safety during independent exercise       Expected Outcomes Short Term: Able to explain why pulse checking is important during independent exercise;Long Term: Able to check pulse independently and accurately       Understanding of Exercise Prescription Yes       Intervention Provide education, explanation, and written materials on patient's individual exercise prescription       Expected Outcomes Short Term: Able to explain program exercise prescription;Long Term: Able to explain home exercise prescription to exercise independently                Exercise Goals Re-Evaluation :  Exercise Goals Re-Evaluation     Row Name 01/07/21 0940 01/21/21 1257 01/23/21 1027 01/30/21 1146 02/03/21 1356     Exercise Goal Re-Evaluation   Exercise Goals Review Increase Physical Activity;Able to understand and use rate of perceived exertion (RPE) scale;Knowledge and understanding of Target Heart Rate Range (THRR);Understanding of Exercise Prescription;Increase Strength and Stamina;Able to understand and use Dyspnea scale;Able to check pulse independently  Increase Physical Activity;Increase Strength and Stamina Increase Physical  Activity;Increase Strength and Stamina Increase Physical Activity;Increase Strength and Stamina Increase Physical Activity;Increase Strength and Stamina   Comments Reviewed RPE and dyspnea scales, THR and program prescription with pt today.  Pt voiced understanding and was given a copy of goals to take home. Maria Trujillo is tolerating exercise well.  She doesnt quite reach her THR range.  Staff will review THR. Maria Trujillo does some yardwork when weather is nice. She tries to walk around the house every hour. EP will review home exercise with her. Reviewed home exercise with pt today.  Pt plans to walk/consider joining [facility] [place] for exercise.  Reviewed THR, pulse, RPE, sign and symptoms, pulse oximetery and when to call 911 or MD.  Also discussed weather considerations and indoor options.  Pt voiced understanding. Maria Trujillo is doing well in rehab.  She really likes the NuStep.  She has done level 3 one day and level 2 another day.  We will work with her to keep her consistent and monitor progress.   Expected Outcomes Short: Use RPE daily to regulate intensity. Long: Follow program prescription in THR. Short: work towards [place] range Long:  improve overall stamina Short: continue to attend rehab consistently Long:  improve overall stamina Short: exercise 1-2 days outside program sessions Long: improve stamina Short: Keep NuStep at level 3  Long: Continue to improve stamina    Row Name 02/18/21 1226 02/27/21 1005 03/04/21 1424 03/17/21 1233 04/01/21 1506     Exercise Goal Re-Evaluation   Exercise Goals Review Increase Physical Activity;Increase Strength and Stamina Increase Physical Activity;Increase Strength and Stamina Increase Physical Activity;Increase Strength and Stamina;Understanding of Exercise Prescription Increase Physical Activity;Increase Strength and Stamina --   Comments Maria Trujillo saw her ortho Dr and he was very pleased her leg strength had improved!  She feels she is getting stronger. Maria Trujillo is walking outside HT  classes.  She walks in her house and in her driveway.  We reviewed the importance of styaing hydrated. Maria Trujillo is doing well in rehab. She is now able to go without breaks! We will start to increase her workloads and monitor her progress. Maria Trujillo has moved up to 1.4 mph on TM.  She stays at 2 lb for strength work due to rotator cuff issues. Out since last review   Expected Outcomes Short:  continue to attend consistently Long:  build strength and stamina Short:  continue to exercise consistently Long:  maintain exercise on her own Short: Increase workloads Long: COnitnue to improve stamina Short:increase workloads on T4  Long: continue to build stamina --    [place] Name 04/08/21 0947             Exercise Goal Re-Evaluation   Exercise Goals Review Increase Physical Activity;Increase Strength and Stamina       Comments Maria Trujillo was walking outside HT classes. She walked in her house and in her driveway. She had not been exercising at home since she has been so weak.       Expected Outcomes Short: continue to attend rehab and to get back into exercise after being out sick with COVID  Long: continue to build stamina                Discharge Exercise Prescription (Final Exercise Prescription Changes):  Exercise Prescription Changes - 03/17/21 1200       Response to Exercise   Blood Pressure (Admit) 120/60    Blood Pressure (Exercise) 148/64    Blood Pressure (Exit) 120/60  Heart Rate (Admit) 56 bpm    Heart Rate (Exercise) 83 bpm    Heart Rate (Exit) 55 bpm    Rating of Perceived Exertion (Exercise) 14    Symptoms none    Duration Continue with 30 min of aerobic exercise without signs/symptoms of physical distress.    Intensity THRR unchanged      Progression   Progression Continue to progress workloads to maintain intensity without signs/symptoms of physical distress.    Average METs 2      Resistance Training   Training Prescription Yes    Weight 2 lb    Reps 10-15      Treadmill    MPH 1.4    Grade 0    Minutes 15    METs 2             Nutrition:  Target Goals: Understanding of nutrition guidelines, daily intake of sodium 1500mg , cholesterol 200mg , calories 30% from fat and 7% or less from saturated fats, daily to have 5 or more servings of fruits and vegetables.  Education: All About Nutrition: -Group instruction provided by verbal, written material, interactive activities, discussions, models, and posters to present general guidelines for heart healthy nutrition including fat, fiber, MyPlate, the role of sodium in heart healthy nutrition, utilization of the nutrition label, and utilization of this knowledge for meal planning. Follow up email sent as well. Written material given at graduation.   Biometrics:  Pre Biometrics - 01/02/21 1548       Pre Biometrics   Height 5' 3.25" (1.607 m)    Weight 188 lb 1.6 oz (85.3 kg)    BMI (Calculated) 33.04    Single Leg Stand 12.19 seconds              Nutrition Therapy Plan and Nutrition Goals:  Nutrition Therapy & Goals - 01/08/21 1433       Nutrition Therapy   Diet Heart healthy, low Na    Drug/Food Interactions Statins/Certain Fruits    Protein (specify units) 65g    Fiber 25 grams    Whole Grain Foods 3 servings    Saturated Fats 12 max. grams    Fruits and Vegetables 5 servings/day    Sodium 1.5 grams      Personal Nutrition Goals   Nutrition Goal ST: try whole wheat bread LT: continue with heart healthy changes    Comments She reports making changes for years before her heart event due to her husband having high blood pressure. She reports not frying and not using much grease, using lean meats. B: bowl of cereal (honeynut cheerios or raisin bran or toasted oats - one time a week will have sausage and egg. Will have coffee (black), she will also have a small glass of orange juice. L: sandwich with [place] or ham with some lettuce and mayo and mustard (white wheat or honey wheat) or some peanut  butter crackers with ritz crackers and activia yogurt and tea. S: cookie or hershey candy. She enjoys oranges, grapes, and bananas. D: chicken tenders or boneless ribeye porkchop on george foreman grill with salad. She also enjoys pinto beans and turnip greens. Discussed heart healthy eating.      Intervention Plan   Intervention Prescribe, educate and counsel regarding individualized specific dietary modifications aiming towards targeted core components such as weight, hypertension, lipid management, diabetes, heart failure and other comorbidities.;Nutrition handout(s) given to patient.    Expected Outcomes Short Term Goal: Understand basic principles of dietary content, such  as calories, fat, sodium, cholesterol and nutrients.;Short Term Goal: A plan has been developed with personal nutrition goals set during dietitian appointment.;Long Term Goal: Adherence to prescribed nutrition plan.             Nutrition Assessments:  MEDIFICTS Score Key: ?70 Need to make dietary changes  40-70 Heart Healthy Diet ? 40 Therapeutic Level Cholesterol Diet  Flowsheet Row Cardiac Rehab from 01/02/2021 in [facility] Cardiac and Pulmonary Rehab  Picture Your Plate Total Score on Admission 57      Picture Your Plate Scores: <16 Unhealthy dietary pattern with much room for improvement. 41-50 Dietary pattern unlikely to meet recommendations for good health and room for improvement. 51-60 More healthful dietary pattern, with some room for improvement.  >60 Healthy dietary pattern, although there may be some specific behaviors that could be improved.    Nutrition Goals Re-Evaluation:  Nutrition Goals Re-Evaluation     [place] Name 01/23/21 1020 02/27/21 1007 04/08/21 0943         Goals   Current Weight 186 lb 11.2 oz (84.7 kg) -- 182 lb 1.6 oz (82.6 kg)     Nutrition Goal ST: add fruit to snack LT: continue with heart healthy changes -- ST: add fruit to snack LT: continue with heart healthy changes      Comment She has tried whole wheat bread and reports liking it more than she thought she would. She has candy for an afternoon snack and has been trying to add fruit - has tried oranges. Maria Trujillo has cut down on red meat.  She uses a [employer].  She tries to have salad.  She also is watching portion sizes. She had drainage down her throat from being sick and it was irritating her. She reports her appetite was poor for the first week, but feels it is back to normal now. She reports maintaining healthy changes.     Expected Outcome ST: add fruit to snack LT: continue with heart healthy changes ST/LT: maintain heart healthy eating ST: add fruit to snack LT: continue with heart healthy changes              Nutrition Goals Discharge (Final Nutrition Goals Re-Evaluation):  Nutrition Goals Re-Evaluation - 04/08/21 0943       Goals   Current Weight 182 lb 1.6 oz (82.6 kg)    Nutrition Goal ST: add fruit to snack LT: continue with heart healthy changes    Comment She had drainage down her throat from being sick and it was irritating her. She reports her appetite was poor for the first week, but feels it is back to normal now. She reports maintaining healthy changes.    Expected Outcome ST: add fruit to snack LT: continue with heart healthy changes             Psychosocial: Target Goals: Acknowledge presence or absence of significant depression and/or stress, maximize coping skills, provide positive support system. Participant is able to verbalize types and ability to use techniques and skills needed for reducing stress and depression.   Education: Stress, Anxiety, and Depression - Group verbal and visual presentation to define topics covered.  Reviews how body is impacted by stress, anxiety, and depression.  Also discusses healthy ways to reduce stress and to treat/manage anxiety and depression.  Written material given at graduation. Flowsheet Row Cardiac Rehab from 03/20/2021 in [facility]  Cardiac and Pulmonary Rehab  Date 01/30/21  Educator [facility]  Instruction Review Code 1- Verbalizes Understanding  Education: Sleep Hygiene -Provides group verbal and written instruction about how sleep can affect your health.  Define sleep hygiene, discuss sleep cycles and impact of sleep habits. Review good sleep hygiene tips.    Initial Review & Psychosocial Screening:  Initial Psych Review & Screening - 12/25/20 1408       Initial Review   Current issues with Current Stress Concerns    Source of Stress Concerns Unable to perform yard/household activities      [place]? Yes   son     Barriers   Psychosocial barriers to participate in program There are no identifiable barriers or psychosocial needs.;The patient should benefit from training in stress management and relaxation.      Screening Interventions   Interventions Provide feedback about the scores to participant;Encouraged to exercise;To provide support and resources with identified psychosocial needs    Expected Outcomes Short Term goal: Utilizing psychosocial counselor, staff and physician to assist with identification of specific Stressors or current issues interfering with healing process. Setting desired goal for each stressor or current issue identified.;Long Term Goal: Stressors or current issues are controlled or eliminated.;Short Term goal: Identification and review with participant of any Quality of Life or Depression concerns found by scoring the questionnaire.;Long Term goal: The participant improves quality of Life and PHQ9 Scores as seen by post scores and/or verbalization of changes             Quality of Life Scores:   Quality of Life - 01/02/21 1553       Quality of Life   Select Quality of Life      Quality of Life Scores   Health/Function Pre 27.21 %    Socioeconomic Pre 30 %    Psych/Spiritual Pre 30 %    Family Pre 30 %    GLOBAL Pre 28.74 %             Scores of 19 and below usually indicate a poorer quality of life in these areas.  A difference of  2-3 points is a clinically meaningful difference.  A difference of 2-3 points in the total score of the Quality of Life Index has been associated with significant improvement in overall quality of life, self-image, physical symptoms, and general health in studies assessing change in quality of life.  PHQ-9: Recent Review Flowsheet Data     Depression screen [facility] 2/9 01/02/2021   Decreased Interest 0   Down, Depressed, Hopeless 0   PHQ - 2 Score 0   Altered sleeping 0   Tired, decreased energy 2   Change in appetite 0   Feeling bad or failure about yourself  0   Trouble concentrating 0   Moving slowly or fidgety/restless 0   Suicidal thoughts 0   PHQ-9 Score 2   Difficult doing work/chores Not difficult at all      Interpretation of Total Score  Total Score Depression Severity:  1-4 = Minimal depression, 5-9 = Mild depression, 10-14 = Moderate depression, 15-19 = Moderately severe depression, 20-27 = Severe depression   Psychosocial Evaluation and Intervention:  Psychosocial Evaluation - 12/25/20 1420       Psychosocial Evaluation & Interventions   Interventions Encouraged to exercise with the program and follow exercise prescription    Comments Ms. Geibel is feeling well post NSTEMI. She said each day is better than the last. Her biggest goal/stressor is yardwork. She wants to make sure she can mow the yard so  she can help her son and she thoroughly enjoys yardwork. She wants to make sure she is strong enough to continue to do so. Her balance sometimes gets in the way so she is encouraged to work hard in [place] to strengthen her stamina. She does not report any issues with depression, anxiety, or sleep.    Expected Outcomes Short: attend cardiac rehab for education and exercise. Long: develop and maintain positive self care habits    Continue Psychosocial Services  Follow up  required by staff             Psychosocial Re-Evaluation:  Psychosocial Re-Evaluation     [place] Name 01/23/21 1024 04/08/21 0955           Psychosocial Re-Evaluation   Current issues with None Identified None Identified      Comments Maria Trujillo reports she has been sleeping well - 1030pm-7am. She likes puzzles, read, gardening and yardwork. She has a good support sytem in her son and daughter as well as her friends. She is close to her niece who is close to her age. Johniece reports she has still been sleeping well. She continues to puzzle and read - she has not been gardening or doing yardwork due to being sick. She has a good support sytem in her son and daughter as well as her friends as well as her niece. She had her support system help her when she was sick. She reports no current stressors.      Expected Outcomes ST: continue stress reducing activities LT: continue with positive attitude ST: continue stress reducing activities LT: continue with positive attitude      Interventions Encouraged to attend Cardiac Rehabilitation for the exercise Encouraged to attend Cardiac Rehabilitation for the exercise      Continue Psychosocial Services  Follow up required by staff Follow up required by staff               Psychosocial Discharge (Final Psychosocial Re-Evaluation):  Psychosocial Re-Evaluation - 04/08/21 0955       Psychosocial Re-Evaluation   Current issues with None Identified    Comments Maria Trujillo reports she has still been sleeping well. She continues to puzzle and read - she has not been gardening or doing yardwork due to being sick. She has a good support sytem in her son and daughter as well as her friends as well as her niece. She had her support system help her when she was sick. She reports no current stressors.    Expected Outcomes ST: continue stress reducing activities LT: continue with positive attitude    Interventions Encouraged to attend Cardiac Rehabilitation for the  exercise    Continue Psychosocial Services  Follow up required by staff             Vocational Rehabilitation: Provide vocational rehab assistance to qualifying candidates.   Vocational Rehab Evaluation & Intervention:  Vocational Rehab - 12/25/20 1408       Initial Vocational Rehab Evaluation & Intervention   Assessment shows need for Vocational Rehabilitation No             Education: Education Goals: Education classes will be provided on a variety of topics geared toward better understanding of heart health and risk factor modification. Participant will state understanding/return demonstration of topics presented as noted by education test scores.  Learning Barriers/Preferences:  Learning Barriers/Preferences - 12/25/20 1408       Learning Barriers/Preferences   Learning Barriers None    Learning Preferences  None             General Cardiac Education Topics:  AED/CPR: - Group verbal and written instruction with the use of models to demonstrate the basic use of the AED with the basic ABC's of resuscitation.   Anatomy and Cardiac Procedures: - Group verbal and visual presentation and models provide information about basic cardiac anatomy and function. Reviews the testing methods done to diagnose heart disease and the outcomes of the test results. Describes the treatment choices: Medical Management, Angioplasty, or Coronary Bypass Surgery for treating various heart conditions including Myocardial Infarction, Angina, Valve Disease, and Cardiac Arrhythmias.  Written material given at graduation. Flowsheet Row Cardiac Rehab from 03/20/2021 in [facility] Cardiac and Pulmonary Rehab  Date 02/20/21  Educator SB  Instruction Review Code 1- Verbalizes Understanding       Medication Safety: - Group verbal and visual instruction to review commonly prescribed medications for heart and lung disease. Reviews the medication, class of the drug, and side effects. Includes the  steps to properly store meds and maintain the prescription regimen.  Written material given at graduation. Flowsheet Row Cardiac Rehab from 03/20/2021 in [facility] Cardiac and Pulmonary Rehab  Date 01/09/21  Educator [facility]  Instruction Review Code 1- Verbalizes Understanding       Intimacy: - Group verbal instruction through game format to discuss how heart and lung disease can affect sexual intimacy. Written material given at graduation..   Know Your Numbers and Heart Failure: - Group verbal and visual instruction to discuss disease risk factors for cardiac and pulmonary disease and treatment options.  Reviews associated critical values for Overweight/Obesity, Hypertension, Cholesterol, and Diabetes.  Discusses basics of heart failure: signs/symptoms and treatments.  Introduces Heart Failure Zone chart for action plan for heart failure.  Written material given at graduation. Flowsheet Row Cardiac Rehab from 03/20/2021 in [facility] Cardiac and Pulmonary Rehab  Date 03/20/21  Educator SB  Instruction Review Code 1- Verbalizes Understanding       Infection Prevention: - Provides verbal and written material to individual with discussion of infection control including proper hand washing and proper equipment cleaning during exercise session. Flowsheet Row Cardiac Rehab from 03/20/2021 in [facility] Cardiac and Pulmonary Rehab  Date 01/02/21  Educator AS  Instruction Review Code 1- Verbalizes Understanding       Falls Prevention: - Provides verbal and written material to individual with discussion of falls prevention and safety. Flowsheet Row Cardiac Rehab from 03/20/2021 in [facility] Cardiac and Pulmonary Rehab  Date 01/02/21  Educator AS  Instruction Review Code 1- Verbalizes Understanding       Other: -Provides group and verbal instruction on various topics (see comments)   Knowledge Questionnaire Score:  Knowledge Questionnaire Score - 01/02/21 1552       Knowledge Questionnaire Score   Pre  Score 21/26 exercise nutrition             Core Components/Risk Factors/Patient Goals at Admission:  Personal Goals and Risk Factors at Admission - 01/02/21 1549       Core Components/Risk Factors/Patient Goals on Admission    Weight Management Yes    Intervention Weight Management: Develop a combined nutrition and exercise program designed to reach desired caloric intake, while maintaining appropriate intake of nutrient and fiber, sodium and fats, and appropriate energy expenditure required for the weight goal.;Weight Management: Provide education and appropriate resources to help participant work on and attain dietary goals.    Admit Weight 188 lb 1.6 oz (85.3 kg)  Goal Weight: Short Term 185 lb (83.9 kg)    Goal Weight: Long Term 185 lb (83.9 kg)    Expected Outcomes Weight Maintenance: Understanding of the daily nutrition guidelines, which includes 25-35% calories from fat, 7% or less cal from saturated fats, less than 200mg  cholesterol, less than 1.5gm of sodium, & 5 or more servings of fruits and vegetables daily;Understanding recommendations for meals to include 15-35% energy as protein, 25-35% energy from fat, 35-60% energy from carbohydrates, less than 200mg  of dietary cholesterol, 20-35 gm of total fiber daily;Understanding of distribution of calorie intake throughout the day with the consumption of 4-5 meals/snacks    Hypertension Yes    Intervention Provide education on lifestyle modifcations including regular physical activity/exercise, weight management, moderate sodium restriction and increased consumption of fresh fruit, vegetables, and low fat dairy, alcohol moderation, and smoking cessation.;Monitor prescription use compliance.    Expected Outcomes Short Term: Continued assessment and intervention until BP is < 140/41mm HG in hypertensive participants. < 130/50mm HG in hypertensive participants with diabetes, heart failure or chronic kidney disease.;Long Term: Maintenance of  blood pressure at goal levels.    Lipids Yes    Intervention Provide education and support for participant on nutrition & aerobic/resistive exercise along with prescribed medications to achieve LDL 70mg , HDL >40mg .             Education:Diabetes - Individual verbal and written instruction to review signs/symptoms of diabetes, desired ranges of glucose level fasting, after meals and with exercise. Acknowledge that pre and post exercise glucose checks will be done for 3 sessions at entry of program.   Core Components/Risk Factors/Patient Goals Review:   Goals and Risk Factor Review     Row Name 01/23/21 1016 02/27/21 1000 04/08/21 0952         Core Components/Risk Factors/Patient Goals Review   Personal Goals Review Weight Management/Obesity;Hypertension;Lipids Weight Management/Obesity;Hypertension Weight Management/Obesity;Hypertension     Review Maria Trujillo reports since starting her weight has decreased about 1 pound - this morning she weighed 184.6lbs. She has a cuff, but does not check it at home. This am it was 142/70. She is still taking her medications as directed. Her LDL was 81 within normal range, but on the higher side of normal in January. She reports she has been making heart healthy diet changes for years. She gets the results of her heart monitor today. Maria Trujillo said her Dr said heart monitor report was ok last time and no changes in medication.  She has a 30 day monitor on now.  She went to ER 4/30 for palpitations.  She had been working outside and was possibly dehydrated.  She reports no symptoms during exercise.  She is checking her BP on days not at [facility]. She was in [employer] day weekend, when she had her monitor on. She will she the MD next week regaridng this. No changes in medications. She is still checking BP when not at rehab - she reports when sick it was higher than usual, but has returned to normal 130s/60s. Today BP was 152/64 - what it ran when it was a bit higher.      Expected Outcomes ST: on non-rehab days - check BP at home. LT: continue to monitor risk factos. Short:  follow up with Dr on monitor Long: continue to manage risk factors Short:  follow up with Dr on monitor Long: continue to manage risk factors              Core Components/Risk  Factors/Patient Goals at Discharge (Final Review):   Goals and Risk Factor Review - 04/08/21 0952       Core Components/Risk Factors/Patient Goals Review   Personal Goals Review Weight Management/Obesity;Hypertension    Review She was in [employer] day weekend, when she had her monitor on. She will she the MD next week regaridng this. No changes in medications. She is still checking BP when not at rehab - she reports when sick it was higher than usual, but has returned to normal 130s/60s. Today BP was 152/64 - what it ran when it was a bit higher.    Expected Outcomes Short:  follow up with Dr on monitor Long: continue to manage risk factors             ITP Comments:  ITP Comments     Row Name 12/25/20 1403 01/02/21 1559 01/07/21 0940 01/08/21 1501 01/15/21 1123   ITP Comments Initial telephone orientation completed. Diagnosis can be found in [facility] 1/9. EP orientation scheduled for Thursday 3/10 at 2pm. Completed 6MWT and gym orientation. Initial ITP created and sent for review to Dr. Emily Filbert, Medical Director. First full day of exercise!  Patient was oriented to gym and equipment including functions, settings, policies, and procedures.  Patient's individual exercise prescription and treatment plan were reviewed.  All starting workloads were established based on the results of the 6 minute walk test done at initial orientation visit.  The plan for exercise progression was also introduced and progression will be customized based on patient's performance and goals. Completed initial RD evaluation 30 Day review completed. Medical Director ITP review done, changes made as directed, and signed approval by  Medical Director.    Row Name 02/12/21 [phone] 03/12/21 0956 03/25/21 0818 04/01/21 1505 04/01/21 1510   ITP Comments 30 Day review completed. Medical Director ITP review done, changes made as directed, and signed approval by Medical Director. 30 Day review completed. Medical Director ITP review done, changes made as directed, and signed approval by Medical Director.  2 visit in [place] called out today.  She was in ED on 03/22/21 for afib with RVR and will need a follow up prior to returning to rehab. Maylie last attended on 5/26.  She has now been exposed to [place] and having symptoms.  She will be out. Patient called out of rehab- does not feel well and  currently staying having symptoms. Tested negative for COVID, will test again later on. Ensure patient needs to be symptom free and negative before returning back. Patient understood.    [place] Name 04/08/21 0959           ITP Comments Pt returns with medical clearance (see note dated 03/26/21 in chart) from her cardiologist to return to cardiac rehab without restriction. Pt able to follow exercise prescription today without complaint.  Will continue to monitor for progression.                Comments:

## 2021-04-10 ENCOUNTER — Encounter: Payer: PPO | Admitting: *Deleted

## 2021-04-10 ENCOUNTER — Other Ambulatory Visit: Payer: Self-pay

## 2021-04-10 DIAGNOSIS — I214 Non-ST elevation (NSTEMI) myocardial infarction: Secondary | ICD-10-CM

## 2021-04-10 NOTE — Progress Notes (Signed)
Daily Session Note  Patient Details  Name: GWYNN CHALKER MRN: [id number] Date of Birth: 03/02/1934 Referring Provider:   Flowsheet Row Cardiac Rehab from 01/02/2021 in [facility] Cardiac and Pulmonary Rehab  Referring Provider [name]       Encounter Date: 04/10/2021  Check In:  Session Check In - 04/10/21 1012       Check-In   Supervising physician immediately available to respond to emergencies See telemetry face sheet for immediately available ER MD    Location [facility]-Cardiac & Pulmonary Rehab    Staff Present Heath Lark, RN, BSN, CCRP;Melissa [place] RDN, Rowe Pavy, BA, ACSM CEP, Exercise Physiologist;Joseph Hood RCP,RRT,BSRT    Virtual Visit No    Medication changes reported     No    Fall or balance concerns reported    No    Warm-up and Cool-down Performed on first and last piece of equipment    Resistance Training Performed Yes    VAD Patient? No    PAD/SET Patient? No      Pain Assessment   Currently in Pain? No/denies                Social History   Tobacco Use  Smoking Status Never  Smokeless Tobacco Never    Goals Met:  Independence with exercise equipment Exercise tolerated well No report of cardiac concerns or symptoms  Goals Unmet:  Not Applicable  Comments: Pt able to follow exercise prescription today without complaint.  Will continue to monitor for progression.    Dr. Emily Filbert is Medical Director for [place].  Dr. Ottie Glazier is Medical Director for [facility].

## 2021-04-15 ENCOUNTER — Other Ambulatory Visit: Payer: Self-pay

## 2021-04-15 DIAGNOSIS — I214 Non-ST elevation (NSTEMI) myocardial infarction: Secondary | ICD-10-CM

## 2021-04-15 NOTE — Progress Notes (Signed)
Daily Session Note  Patient Details  Name: Maria Trujillo MRN: [id number] Date of Birth: 1934/10/04 Referring Provider:   Flowsheet Row Cardiac Rehab from 01/02/2021 in [facility] Cardiac and Pulmonary Rehab  Referring Provider [name]       Encounter Date: 04/15/2021  Check In:  Session Check In - 04/15/21 [name]       Check-In   Supervising physician immediately available to respond to emergencies See telemetry face sheet for immediately available ER MD    Location [facility]-Cardiac & Pulmonary Rehab    Staff Present Birdie Sons, MPA, Elveria Rising, BA, ACSM CEP, Exercise Physiologist;Kara Eliezer Bottom, MS, ASCM CEP, Exercise Physiologist    Virtual Visit No    Medication changes reported     No    Fall or balance concerns reported    No    Warm-up and Cool-down Performed on first and last piece of equipment    Resistance Training Performed Yes    VAD Patient? No    PAD/SET Patient? No      Pain Assessment   Currently in Pain? No/denies                Social History   Tobacco Use  Smoking Status Never  Smokeless Tobacco Never    Goals Met:  Independence with exercise equipment Exercise tolerated well No report of cardiac concerns or symptoms Strength training completed today  Goals Unmet:  Not Applicable  Comments: Pt able to follow exercise prescription today without complaint.  Will continue to monitor for progression.    Dr. Emily Filbert is Medical Director for [place].  Dr. Ottie Glazier is Medical Director for [facility] Pulmonary Rehabilitation.

## 2021-04-17 ENCOUNTER — Other Ambulatory Visit: Payer: Self-pay

## 2021-04-17 DIAGNOSIS — I214 Non-ST elevation (NSTEMI) myocardial infarction: Secondary | ICD-10-CM | POA: Diagnosis not present

## 2021-04-17 NOTE — Progress Notes (Signed)
Daily Session Note  Patient Details  Name: Maria Trujillo MRN: [id number] Date of Birth: February 06, 1934 Referring Provider:   Flowsheet Row Cardiac Rehab from 01/02/2021 in [facility] Cardiac and Pulmonary Rehab  Referring Provider [name]       Encounter Date: 04/17/2021  Check In:  Session Check In - 04/17/21 [place]       Check-In   Supervising physician immediately available to respond to emergencies See telemetry face sheet for immediately available ER MD    Location [facility]-Cardiac & Pulmonary Rehab    Staff Present Birdie Sons, MPA, RN;Melissa Caiola RDN, Wilhelmina Mcardle, BS, ACSM CEP, Exercise Physiologist;Amanda Oletta Darter, BA, ACSM CEP, Exercise Physiologist    Virtual Visit No    Medication changes reported     No    Fall or balance concerns reported    No    Warm-up and Cool-down Performed on first and last piece of equipment    Resistance Training Performed Yes    VAD Patient? No    PAD/SET Patient? No      Pain Assessment   Currently in Pain? No/denies                Social History   Tobacco Use  Smoking Status Never  Smokeless Tobacco Never    Goals Met:  Independence with exercise equipment Exercise tolerated well No report of cardiac concerns or symptoms Strength training completed today  Goals Unmet:  Not Applicable  Comments: Pt able to follow exercise prescription today without complaint.  Will continue to monitor for progression.    Dr. Emily Filbert is Medical Director for [place].  Dr. Ottie Glazier is Medical Director for [facility] Pulmonary Rehabilitation.

## 2021-04-22 ENCOUNTER — Other Ambulatory Visit: Payer: Self-pay

## 2021-04-22 VITALS — Ht 63.25 in | Wt 178.9 lb

## 2021-04-22 DIAGNOSIS — I214 Non-ST elevation (NSTEMI) myocardial infarction: Secondary | ICD-10-CM | POA: Diagnosis not present

## 2021-04-22 NOTE — Progress Notes (Signed)
Daily Session Note  Patient Details  Name: Maria Trujillo MRN: [id number] Date of Birth: December 27, 1933 Referring Provider:   Flowsheet Row Cardiac Rehab from 01/02/2021 in [facility] Cardiac and Pulmonary Rehab  Referring Provider [name]       Encounter Date: 04/22/2021  Check In:  Session Check In - 04/22/21 0123       Check-In   Supervising physician immediately available to respond to emergencies See telemetry face sheet for immediately available ER MD    Location [facility]-Cardiac & Pulmonary Rehab    Staff Present Birdie Sons, MPA, RN;Melissa Caiola RDN, Rowe Pavy, BA, ACSM CEP, Exercise Physiologist;Kara Eliezer Bottom, MS, ASCM CEP, Exercise Physiologist    Virtual Visit No    Medication changes reported     No    Fall or balance concerns reported    No    Warm-up and Cool-down Performed on first and last piece of equipment    Resistance Training Performed Yes    VAD Patient? No    PAD/SET Patient? No      Pain Assessment   Currently in Pain? No/denies                Social History   Tobacco Use  Smoking Status Never  Smokeless Tobacco Never    Goals Met:  Independence with exercise equipment Exercise tolerated well Personal goals reviewed No report of cardiac concerns or symptoms Strength training completed today  Goals Unmet:  Not Applicable  Comments: Pt able to follow exercise prescription today without complaint.  Will continue to monitor for progression.    Dr. Emily Filbert is Medical Director for [facility].  Dr. Ottie Glazier is Medical Director for [facility] Pulmonary Rehabilitation.

## 2021-04-24 ENCOUNTER — Other Ambulatory Visit: Payer: Self-pay

## 2021-04-24 DIAGNOSIS — I214 Non-ST elevation (NSTEMI) myocardial infarction: Secondary | ICD-10-CM

## 2021-04-24 NOTE — Progress Notes (Signed)
Daily Session Note  Patient Details  Name: Maria Trujillo MRN: [id number] Date of Birth: Jul 29, 1934 Referring Provider:   Flowsheet Row Cardiac Rehab from 01/02/2021 in [facility] Cardiac and Pulmonary Rehab  Referring Provider [name]       Encounter Date: 04/24/2021  Check In:  Session Check In - 04/24/21 0946       Check-In   Supervising physician immediately available to respond to emergencies See telemetry face sheet for immediately available ER MD    Location [facility]-Cardiac & Pulmonary Rehab    Staff Present Birdie Sons, MPA, RN;Amanda Oletta Darter, BA, ACSM CEP, Exercise Physiologist;Melissa Caiola, RDN, LDN    Virtual Visit No    Medication changes reported     No    Fall or balance concerns reported    No    Warm-up and Cool-down Performed on first and last piece of equipment    Resistance Training Performed Yes    VAD Patient? No    PAD/SET Patient? No      Pain Assessment   Currently in Pain? No/denies                Social History   Tobacco Use  Smoking Status Never  Smokeless Tobacco Never    Goals Met:  Independence with exercise equipment Exercise tolerated well No report of cardiac concerns or symptoms Strength training completed today  Goals Unmet:  Not Applicable  Comments: Pt able to follow exercise prescription today without complaint.  Will continue to monitor for progression.    Dr. Emily Filbert is Medical Director for [place].  Dr. Ottie Glazier is Medical Director for [facility] Pulmonary Rehabilitation.

## 2021-04-25 DIAGNOSIS — R208 Other disturbances of skin sensation: Secondary | ICD-10-CM | POA: Diagnosis not present

## 2021-04-25 DIAGNOSIS — L538 Other specified erythematous conditions: Secondary | ICD-10-CM | POA: Diagnosis not present

## 2021-04-25 DIAGNOSIS — L820 Inflamed seborrheic keratosis: Secondary | ICD-10-CM | POA: Diagnosis not present

## 2021-04-25 DIAGNOSIS — L814 Other melanin hyperpigmentation: Secondary | ICD-10-CM | POA: Diagnosis not present

## 2021-04-25 DIAGNOSIS — X32XXXA Exposure to sunlight, initial encounter: Secondary | ICD-10-CM | POA: Diagnosis not present

## 2021-04-25 DIAGNOSIS — Z08 Encounter for follow-up examination after completed treatment for malignant neoplasm: Secondary | ICD-10-CM | POA: Diagnosis not present

## 2021-04-25 DIAGNOSIS — Z85828 Personal history of other malignant neoplasm of skin: Secondary | ICD-10-CM | POA: Diagnosis not present

## 2021-04-29 ENCOUNTER — Encounter: Payer: PPO | Attending: Cardiology | Admitting: *Deleted

## 2021-04-29 ENCOUNTER — Other Ambulatory Visit: Payer: Self-pay

## 2021-04-29 DIAGNOSIS — Z5189 Encounter for other specified aftercare: Secondary | ICD-10-CM | POA: Insufficient documentation

## 2021-04-29 DIAGNOSIS — I252 Old myocardial infarction: Secondary | ICD-10-CM | POA: Diagnosis not present

## 2021-04-29 DIAGNOSIS — I214 Non-ST elevation (NSTEMI) myocardial infarction: Secondary | ICD-10-CM

## 2021-04-29 NOTE — Progress Notes (Signed)
Daily Session Note  Patient Details  Name: Maria Trujillo MRN: [id number] Date of Birth: 19-Feb-1934 Referring Provider:   Flowsheet Row Cardiac Rehab from 01/02/2021 in [facility] Cardiac and Pulmonary Rehab  Referring Provider [name]       Encounter Date: 04/29/2021  Check In:  Session Check In - 04/29/21 1144       Check-In   Supervising physician immediately available to respond to emergencies See telemetry face sheet for immediately available ER MD    Location [facility]-Cardiac & Pulmonary Rehab    Staff Present Heath Lark, RN, BSN, CCRP;Kristen Coble, RN,BC,MSN;Melissa Goodrich, RDN, LDN;Jessica [place], MA, RCEP, CCRP, CCET    Virtual Visit No    Medication changes reported     No    Fall or balance concerns reported    No    Warm-up and Cool-down Performed on first and last piece of equipment    Resistance Training Performed Yes    VAD Patient? No    PAD/SET Patient? No      Pain Assessment   Currently in Pain? No/denies                Social History   Tobacco Use  Smoking Status Never  Smokeless Tobacco Never    Goals Met:  Independence with exercise equipment Exercise tolerated well No report of cardiac concerns or symptoms  Goals Unmet:  Not Applicable  Comments: Pt able to follow exercise prescription today without complaint.  Will continue to monitor for progression.    Dr. Emily Filbert is Medical Director for [facility].  Dr. Ottie Glazier is Medical Director for [facility] Pulmonary Rehabilitation.

## 2021-05-01 ENCOUNTER — Encounter: Payer: PPO | Admitting: *Deleted

## 2021-05-01 ENCOUNTER — Other Ambulatory Visit: Payer: Self-pay

## 2021-05-01 DIAGNOSIS — I214 Non-ST elevation (NSTEMI) myocardial infarction: Secondary | ICD-10-CM

## 2021-05-01 DIAGNOSIS — Z5189 Encounter for other specified aftercare: Secondary | ICD-10-CM | POA: Diagnosis not present

## 2021-05-01 NOTE — Progress Notes (Signed)
Daily Session Note  Patient Details  Name: Maria Trujillo MRN: [id number] Date of Birth: 03/15/1934 Referring Provider:   Flowsheet Row Cardiac Rehab from 01/02/2021 in [facility] Cardiac and Pulmonary Rehab  Referring Provider [name]       Encounter Date: 05/01/2021  Check In:  Session Check In - 05/01/21 1139       Check-In   Supervising physician immediately available to respond to emergencies See telemetry face sheet for immediately available ER MD    Location [facility]-Cardiac & Pulmonary Rehab    Staff Present Kelly Bollinger, MPA, RN;Melissa Caiola, RDN, LDN;Jessica Hawkins, MA, RCEP, CCRP, CCET;Susanne Bice, RN, BSN, CCRP    Virtual Visit No    Medication changes reported     No    Fall or balance concerns reported    No    Warm-up and Cool-down Performed on first and last piece of equipment    Resistance Training Performed Yes    VAD Patient? No    PAD/SET Patient? No      Pain Assessment   Currently in Pain? No/denies                Social History   Tobacco Use  Smoking Status Never  Smokeless Tobacco Never    Goals Met:  Independence with exercise equipment Exercise tolerated well No report of cardiac concerns or symptoms  Goals Unmet:  Not Applicable  Comments: Pt able to follow exercise prescription today without complaint.  Will continue to monitor for progression.    Dr. Mark Miller is Medical Director for [facility].  Dr. Fuad Aleskerov is Medical Director for [facility] Pulmonary Rehabilitation. 

## 2021-05-02 NOTE — Patient Instructions (Signed)
Discharge Patient Instructions  Patient Details  Name: Maria Trujillo MRN: [id number] Date of Birth: [birth year] Referring Provider:  Isaias Cowman, MD   Number of Visits: 50  Reason for Discharge:  Patient reached a stable level of exercise. Patient independent in their exercise. Patient has met program and personal goals.  Smoking History:  Social History   Tobacco Use  Smoking Status Never  Smokeless Tobacco Never    Diagnosis:  NSTEMI (non-ST elevated myocardial infarction) [facility])  Initial Exercise Prescription:  Initial Exercise Prescription - 01/02/21 1500       Date of Initial Exercise RX and Referring Provider   Date 01/02/21    Referring Provider [name]      Treadmill   MPH 1    Grade 0    Minutes 15    METs 1.77      Recumbant Bike   Level 1    RPM 60    Minutes 15    METs 1      NuStep   Level 1    SPM 80    Minutes 15    METs 1      REL-XR   Level 1    Speed 50    Minutes 15    METs 1      Prescription Details   Frequency (times per week) 2    Duration Progress to 30 minutes of continuous aerobic without signs/symptoms of physical distress      Intensity   THRR 40-80% of Max Heartrate 87-119    Ratings of Perceived Exertion 11-13    Perceived Dyspnea 0-4      Resistance Training   Training Prescription Yes    Weight 2 lb    Reps 10-15             Discharge Exercise Prescription (Final Exercise Prescription Changes):  Exercise Prescription Changes - 04/29/21 1500       Response to Exercise   Blood Pressure (Admit) 112/60    Blood Pressure (Exit) 110/58    Heart Rate (Admit) 47 bpm    Heart Rate (Exercise) 74 bpm    Heart Rate (Exit) 59 bpm    Oxygen Saturation (Admit) 96 %    Oxygen Saturation (Exercise) 98 %    Oxygen Saturation (Exit) 96 %    Rating of Perceived Exertion (Exercise) 13    Symptoms none    Duration Continue with 30 min of aerobic exercise without signs/symptoms of physical distress.     Intensity THRR unchanged      Progression   Progression Continue to progress workloads to maintain intensity without signs/symptoms of physical distress.    Average METs 2.13      Resistance Training   Training Prescription Yes    Weight 2 lb    Reps 10-15      Interval Training   Interval Training No      Treadmill   MPH 1.3    Grade 0    Minutes 15    METs 2      NuStep   Level 4    Minutes 15    METs 2.4      T5 Nustep   Level 4    Minutes 15    METs 2      Home Exercise Plan   Plans to continue exercise at Home (comment)   walking   Frequency Add 2 additional days to program exercise sessions.    Initial Home Exercises Provided 01/30/21  Functional Capacity:  6 Minute Walk     Row Name 01/02/21 1545 04/22/21 1104       6 Minute Walk   Phase Initial Discharge    Distance 700 feet 810 feet    Distance % Change -- 15.7 %    Distance Feet Change -- 110 ft    Walk Time 6 minutes 6 minutes    # of Rest Breaks 0 0    MPH 1.3 1.53    METS 0.73 1.23    RPE 11 13    Perceived Dyspnea  1 1    VO2 Peak 2.57 4.32    Symptoms -- No    Resting HR 54 bpm 50 bpm    Resting BP 106/58 118/68    Resting Oxygen Saturation  99 % 97 %    Exercise Oxygen Saturation  during 6 min walk 98 % 98 %    Max Ex. HR 103 bpm 89 bpm    Max Ex. BP 128/60 182/66    2 Minute Post BP 104/58 --             Pre Biometrics - 01/02/21 1548       Pre Biometrics   Height 5' 3.25" (1.607 m)    Weight 188 lb 1.6 oz (85.3 kg)    BMI (Calculated) 33.04    Single Leg Stand 12.19 seconds             Post Biometrics - 04/22/21 1119        Post  Biometrics   Height 5' 3.25" (1.607 m)    Weight 178 lb 14.4 oz (81.1 kg)    BMI (Calculated) 31.42             Nutrition:  Nutrition Therapy & Goals - 01/08/21 1433       Nutrition Therapy   Diet Heart healthy, low Na    Drug/Food Interactions Statins/Certain Fruits    Protein (specify units) 65g     Fiber 25 grams    Whole Grain Foods 3 servings    Saturated Fats 12 max. grams    Fruits and Vegetables 5 servings/day    Sodium 1.5 grams      Personal Nutrition Goals   Nutrition Goal ST: try whole wheat bread LT: continue with heart healthy changes    Comments She reports making changes for years before her heart event due to her husband having high blood pressure. She reports not frying and not using much grease, using lean meats. B: bowl of cereal (honeynut cheerios or raisin bran or toasted oats - one time a week will have sausage and egg. Will have coffee (black), she will also have a small glass of orange juice. L: sandwich with [place] or ham with some lettuce and mayo and mustard (white wheat or honey wheat) or some peanut butter crackers with ritz crackers and activia yogurt and tea. S: cookie or hershey candy. She enjoys oranges, grapes, and bananas. D: chicken tenders or boneless ribeye porkchop on george foreman grill with salad. She also enjoys pinto beans and turnip greens. Discussed heart healthy eating.      Intervention Plan   Intervention Prescribe, educate and counsel regarding individualized specific dietary modifications aiming towards targeted core components such as weight, hypertension, lipid management, diabetes, heart failure and other comorbidities.;Nutrition handout(s) given to patient.    Expected Outcomes Short Term Goal: Understand basic principles of dietary content, such as calories, fat, sodium, cholesterol and nutrients.;Short Term Goal: A  plan has been developed with personal nutrition goals set during dietitian appointment.;Long Term Goal: Adherence to prescribed nutrition plan.              Goals reviewed with patient; copy given to patient.

## 2021-05-06 ENCOUNTER — Other Ambulatory Visit: Payer: Self-pay

## 2021-05-06 DIAGNOSIS — Z5189 Encounter for other specified aftercare: Secondary | ICD-10-CM | POA: Diagnosis not present

## 2021-05-06 DIAGNOSIS — I214 Non-ST elevation (NSTEMI) myocardial infarction: Secondary | ICD-10-CM

## 2021-05-06 NOTE — Progress Notes (Signed)
Discharge Progress Report  Patient Details  Name: Maria Trujillo MRN: [id number] Date of Birth: [birth year] Referring Provider:   Flowsheet Row Cardiac Rehab from 01/02/2021 in [facility] Cardiac and Pulmonary Rehab  Referring Provider [name]        Number of Visits: 78  Reason for Discharge:  Patient reached a stable level of exercise. Patient independent in their exercise. Patient has met program and personal goals.  Smoking History:  Social History   Tobacco Use  Smoking Status Never  Smokeless Tobacco Never    Diagnosis:  NSTEMI (non-ST elevated myocardial infarction) ([place])  ADL [facility]:   Initial Exercise Prescription:  Initial Exercise Prescription - 01/02/21 1500       Date of Initial Exercise RX and Referring Provider   Date 01/02/21    Referring Provider [name]      Treadmill   MPH 1    Grade 0    Minutes 15    METs 1.77      Recumbant Bike   Level 1    RPM 60    Minutes 15    METs 1      NuStep   Level 1    SPM 80    Minutes 15    METs 1      REL-XR   Level 1    Speed 50    Minutes 15    METs 1      Prescription Details   Frequency (times per week) 2    Duration Progress to 30 minutes of continuous aerobic without signs/symptoms of physical distress      Intensity   THRR 40-80% of Max Heartrate 87-119    Ratings of Perceived Exertion 11-13    Perceived Dyspnea 0-4      Resistance Training   Training Prescription Yes    Weight 2 lb    Reps 10-15             Discharge Exercise Prescription (Final Exercise Prescription Changes):  Exercise Prescription Changes - 04/29/21 1500       Response to Exercise   Blood Pressure (Admit) 112/60    Blood Pressure (Exit) 110/58    Heart Rate (Admit) 47 bpm    Heart Rate (Exercise) 74 bpm    Heart Rate (Exit) 59 bpm    Oxygen Saturation (Admit) 96 %    Oxygen Saturation (Exercise) 98 %    Oxygen Saturation (Exit) 96 %    Rating of Perceived Exertion (Exercise) 13    Symptoms none     Duration Continue with 30 min of aerobic exercise without signs/symptoms of physical distress.    Intensity THRR unchanged      Progression   Progression Continue to progress workloads to maintain intensity without signs/symptoms of physical distress.    Average METs 2.13      Resistance Training   Training Prescription Yes    Weight 2 lb    Reps 10-15      Interval Training   Interval Training No      Treadmill   MPH 1.3    Grade 0    Minutes 15    METs 2      NuStep   Level 4    Minutes 15    METs 2.4      T5 Nustep   Level 4    Minutes 15    METs 2      Home Exercise Plan   Plans to continue exercise at Home (comment)  walking   Frequency Add 2 additional days to program exercise sessions.    Initial Home Exercises Provided 01/30/21             Functional Capacity:  6 Minute Walk     Row Name 01/02/21 1545 04/22/21 1104       6 Minute Walk   Phase Initial Discharge    Distance 700 feet 810 feet    Distance % Change -- 15.7 %    Distance Feet Change -- 110 ft    Walk Time 6 minutes 6 minutes    # of Rest Breaks 0 0    MPH 1.3 1.53    METS 0.73 1.23    RPE 11 13    Perceived Dyspnea  1 1    VO2 Peak 2.57 4.32    Symptoms -- No    Resting HR 54 bpm 50 bpm    Resting BP 106/58 118/68    Resting Oxygen Saturation  99 % 97 %    Exercise Oxygen Saturation  during 6 min walk 98 % 98 %    Max Ex. HR 103 bpm 89 bpm    Max Ex. BP 128/60 182/66    2 Minute Post BP 104/58 --             Psychological, QOL, Others - Outcomes: PHQ 2/9: Depression screen [facility] 2/9 04/29/2021 01/02/2021  Decreased Interest 1 0  Down, Depressed, Hopeless 0 0  PHQ - 2 Score 1 0  Altered sleeping 1 0  Tired, decreased energy 0 2  Change in appetite 0 0  Feeling bad or failure about yourself  0 0  Trouble concentrating 0 0  Moving slowly or fidgety/restless 0 0  Suicidal thoughts 0 0  PHQ-9 Score 2 2  Difficult doing work/chores Not difficult at all Not difficult at  all    Quality of Life:  Quality of Life - 04/29/21 1139       Quality of Life Scores   Health/Function Pre 27.21 %    Health/Function Post 25.82 %    Health/Function % Change -5.11 %    Socioeconomic Pre 30 %    Socioeconomic Post 25.75 %    Socioeconomic % Change  -14.17 %    Psych/Spiritual Pre 30 %    Psych/Spiritual Post 26.53 %    Psych/Spiritual % Change -11.57 %    Family Pre 30 %    Family Post 30 %    Family % Change 0 %    GLOBAL Pre 28.74 %    GLOBAL Post 26.52 %    GLOBAL % Change -7.72 %               Nutrition Discharge:   Education Questionnaire Score:  Knowledge Questionnaire Score - 04/29/21 1140       Knowledge Questionnaire Score   Pre Score 21/26 exercise nutrition    Post Score 24/26   MI Nutrition  Responses reviewed and she verbalized understanding             Goals reviewed with patient; copy given to patient.

## 2021-05-06 NOTE — Progress Notes (Signed)
Daily Session Note  Patient Details  Name: Maria Trujillo MRN: [id number] Date of Birth: 1934/08/06 Referring Provider:   Flowsheet Row Cardiac Rehab from 01/02/2021 in [facility] Cardiac and Pulmonary Rehab  Referring Provider [name]       Encounter Date: 05/06/2021  Check In:  Session Check In - 05/06/21 1012       Check-In   Supervising physician immediately available to respond to emergencies See telemetry face sheet for immediately available ER MD    Location [facility]-Cardiac & Pulmonary Rehab    Staff Present Birdie Sons, MPA, RN;Melissa Vancleave, RDN, LDN;Jessica Luan Pulling, MA, RCEP, CCRP, CCET    Virtual Visit No    Medication changes reported     No    Fall or balance concerns reported    No    Warm-up and Cool-down Performed on first and last piece of equipment    Resistance Training Performed Yes    VAD Patient? No    PAD/SET Patient? No      Pain Assessment   Currently in Pain? No/denies                Social History   Tobacco Use  Smoking Status Never  Smokeless Tobacco Never    Goals Met:  Independence with exercise equipment Exercise tolerated well No report of cardiac concerns or symptoms Strength training completed today  Goals Unmet:  Not Applicable  Comments:  Krissie graduated today from  rehab with 36 sessions completed.  Details of the patient's exercise prescription and what She needs to do in order to continue the prescription and progress were discussed with patient.  Patient was given a copy of prescription and goals.  Patient verbalized understanding.  Christen plans to continue to exercise by walking at home.     Dr. Emily Filbert is Medical Director for [facility].  Dr. Ottie Glazier is Medical Director for [facility] Pulmonary Rehabilitation.

## 2021-05-06 NOTE — Progress Notes (Signed)
Cardiac Individual Treatment Plan  Patient Details  Name: Maria Trujillo MRN: [id number] Date of Birth: [birth year] Referring Provider:   Flowsheet Row Cardiac Rehab from 01/02/2021 in [facility] Cardiac and Pulmonary Rehab  Referring Provider [name]       Initial Encounter Date:  Flowsheet Row Cardiac Rehab from 01/02/2021 in [facility] Cardiac and Pulmonary Rehab  Date 01/02/21       Visit Diagnosis: NSTEMI (non-ST elevated myocardial infarction) [facility])  Patient's Home Medications on Admission:  Current Outpatient Medications:    acetaminophen (TYLENOL) 650 MG CR tablet, Take 650 mg by mouth daily as needed for pain., Disp: , Rfl:    apixaban (ELIQUIS) 5 MG TABS tablet, Take 1 tablet (5 mg total) by mouth 2 (two) times daily., Disp: 60 tablet, Rfl: 1   aspirin 81 MG tablet, Take 81 mg by mouth at bedtime., Disp: , Rfl:    atorvastatin (LIPITOR) 80 MG tablet, Take 1 tablet (80 mg total) by mouth at bedtime., Disp: 30 tablet, Rfl: 2   bimatoprost (LUMIGAN) 0.03 % ophthalmic solution, Place 1 drop into both eyes at bedtime., Disp: , Rfl:    Calcium Carb-Cholecalciferol (CALCIUM CARBONATE-VITAMIN D3) 600-400 MG-UNIT TABS, Take 1 tablet by mouth daily., Disp: , Rfl:    cholecalciferol (VITAMIN D) 1000 units tablet, Take 1,000 Units by mouth daily., Disp: , Rfl:    fexofenadine (ALLEGRA) 180 MG tablet, Take 180 mg by mouth daily as needed for allergies or rhinitis., Disp: , Rfl:    isosorbide mononitrate (IMDUR) 30 MG 24 hr tablet, Take 1 tablet (30 mg total) by mouth daily., Disp: 30 tablet, Rfl: 2   levothyroxine (SYNTHROID, LEVOTHROID) 100 MCG tablet, Take 100 mcg by mouth daily before breakfast., Disp: , Rfl:    lisinopril (ZESTRIL) 5 MG tablet, Take 1 tablet (5 mg total) by mouth daily., Disp: 30 tablet, Rfl: 2   metoprolol tartrate (LOPRESSOR) 25 MG tablet, Take 1 tablet (25 mg total) by mouth 2 (two) times daily., Disp: 60 tablet, Rfl: 2   Multiple Vitamin (MULTIVITAMIN WITH MINERALS) TABS  tablet, Take 1 tablet by mouth daily., Disp: , Rfl:    Multiple Vitamins-Minerals (PRESERVISION AREDS 2 PO), Take 2 tablets by mouth 2 (two) times daily., Disp: , Rfl:    Omega-3 Fatty Acids (FISH OIL OMEGA-3) 1000 MG CAPS, Take 1,000 mg by mouth daily., Disp: , Rfl:    timolol (TIMOPTIC) 0.5 % ophthalmic solution, Place 1 drop into both eyes daily., Disp: , Rfl:   Past Medical History: Past Medical History:  Diagnosis Date   Arthritis    History of kidney stones    Hypothyroidism    Myocardial infarct (HCC)    PONV (postoperative nausea and vomiting)     Tobacco Use: Social History   Tobacco Use  Smoking Status Never  Smokeless Tobacco Never    Labs: Recent Review Flowsheet Data     Labs for ITP Cardiac and Pulmonary Rehab Latest Ref Rng & Units 11/04/2020   Cholestrol 0 - 200 mg/dL 150   LDLCALC 0 - 99 mg/dL 81   HDL >40 mg/dL 60   Trlycerides <150 mg/dL 45        Exercise Target Goals: Exercise Program Goal: Individual exercise prescription set using results from initial 6 min walk test and THRR while considering  patient's activity barriers and safety.   Exercise Prescription Goal: Initial exercise prescription builds to 30-45 minutes a day of aerobic activity, 2-3 days per week.  Home exercise guidelines will be given  to patient during program as part of exercise prescription that the participant will acknowledge.   Education: Aerobic Exercise: - Group verbal and visual presentation on the components of exercise prescription. Introduces F.I.T.T principle from ACSM for exercise prescriptions.  Reviews F.I.T.T. principles of aerobic exercise including progression. Written material given at graduation. Flowsheet Row Cardiac Rehab from 04/17/2021 in [facility] Cardiac and Pulmonary Rehab  Date 04/17/21  Educator [facility]  Instruction Review Code 1- Verbalizes Understanding       Education: Resistance Exercise: - Group verbal and visual presentation on the components of  exercise prescription. Introduces F.I.T.T principle from ACSM for exercise prescriptions  Reviews F.I.T.T. principles of resistance exercise including progression. Written material given at graduation. Flowsheet Row Cardiac Rehab from 04/17/2021 in [name] Cardiac and Pulmonary Rehab  Date 02/20/21  Educator [facility]  Instruction Review Code 1- Verbalizes Understanding        Education: Exercise & Equipment Safety: - Individual verbal instruction and demonstration of equipment use and safety with use of the equipment. Flowsheet Row Cardiac Rehab from 04/17/2021 in [facility] Cardiac and Pulmonary Rehab  Date 01/02/21  Educator AS  Instruction Review Code 1- Verbalizes Understanding       Education: Exercise Physiology & General Exercise Guidelines: - Group verbal and written instruction with models to review the exercise physiology of the cardiovascular system and associated critical values. Provides general exercise guidelines with specific guidelines to those with heart or lung disease.    Education: Flexibility, Balance, Mind/Body Relaxation: - Group verbal and visual presentation with interactive activity on the components of exercise prescription. Introduces F.I.T.T principle from ACSM for exercise prescriptions. Reviews F.I.T.T. principles of flexibility and balance exercise training including progression. Also discusses the mind body connection.  Reviews various relaxation techniques to help reduce and manage stress (i.e. Deep breathing, progressive muscle relaxation, and visualization). Balance handout provided to take home. Written material given at graduation. Flowsheet Row Cardiac Rehab from 04/17/2021 in [facility] Cardiac and Pulmonary Rehab  Date 02/27/21  Educator AS  Instruction Review Code 1- Verbalizes Understanding       Activity Barriers & Risk Stratification:  Activity Barriers & Cardiac Risk Stratification - 12/25/20 1403       Activity Barriers & Cardiac Risk Stratification    Activity Barriers Left Knee Replacement;Right Knee Replacement;Other (comment);Assistive Device;Balance Concerns    Comments both shoulders are "bad"    Cardiac Risk Stratification Moderate             6 Minute Walk:  6 Minute Walk     Row Name 01/02/21 1545 04/22/21 1104       6 Minute Walk   Phase Initial Discharge    Distance 700 feet 810 feet    Distance % Change -- 15.7 %    Distance Feet Change -- 110 ft    Walk Time 6 minutes 6 minutes    # of Rest Breaks 0 0    MPH 1.3 1.53    METS 0.73 1.23    RPE 11 13    Perceived Dyspnea  1 1    VO2 Peak 2.57 4.32    Symptoms -- No    Resting HR 54 bpm 50 bpm    Resting BP 106/58 118/68    Resting Oxygen Saturation  99 % 97 %    Exercise Oxygen Saturation  during 6 min walk 98 % 98 %    Max Ex. HR 103 bpm 89 bpm    Max Ex. BP 128/60 182/66  2 Minute Post BP 104/58 --             Oxygen Initial Assessment:   Oxygen Re-Evaluation:   Oxygen Discharge (Final Oxygen Re-Evaluation):   Initial Exercise Prescription:  Initial Exercise Prescription - 01/02/21 1500       Date of Initial Exercise RX and Referring Provider   Date 01/02/21    Referring Provider [name]      Treadmill   MPH 1    Grade 0    Minutes 15    METs 1.77      Recumbant Bike   Level 1    RPM 60    Minutes 15    METs 1      NuStep   Level 1    SPM 80    Minutes 15    METs 1      REL-XR   Level 1    Speed 50    Minutes 15    METs 1      Prescription Details   Frequency (times per week) 2    Duration Progress to 30 minutes of continuous aerobic without signs/symptoms of physical distress      Intensity   THRR 40-80% of Max Heartrate 87-119    Ratings of Perceived Exertion 11-13    Perceived Dyspnea 0-4      Resistance Training   Training Prescription Yes    Weight 2 lb    Reps 10-15             Perform Capillary Blood Glucose checks as needed.  Exercise Prescription Changes:   Exercise Prescription  Changes     Row Name 01/02/21 1500 01/21/21 1200 02/03/21 1300 02/18/21 1200 03/04/21 1400     Response to Exercise   Blood Pressure (Admit) 106/58 132/54 142/72 130/62 148/70   Blood Pressure (Exercise) 128/60 122/60 130/70 138/70 124/68   Blood Pressure (Exit) 104/58 114/60 120/62 130/62 116/60   Heart Rate (Admit) 54 bpm 51 bpm 67 bpm 45 bpm 55 bpm   Heart Rate (Exercise) 103 bpm 76 bpm 69 bpm 72 bpm 82 bpm   Heart Rate (Exit) 54 bpm 72 bpm 52 bpm 56 bpm 60 bpm   Oxygen Saturation (Admit) 98 % -- -- -- --   Oxygen Saturation (Exercise) 99 % -- -- -- --   Rating of Perceived Exertion (Exercise) 11 12 13 13 13    Perceived Dyspnea (Exercise) 1 -- -- -- --   Symptoms none none none none none   Duration -- Continue with 30 min of aerobic exercise without signs/symptoms of physical distress. Continue with 30 min of aerobic exercise without signs/symptoms of physical distress. Continue with 30 min of aerobic exercise without signs/symptoms of physical distress. Continue with 30 min of aerobic exercise without signs/symptoms of physical distress.   Intensity -- THRR unchanged THRR unchanged THRR unchanged THRR unchanged     Progression   Progression -- Continue to progress workloads to maintain intensity without signs/symptoms of physical distress. Continue to progress workloads to maintain intensity without signs/symptoms of physical distress. Continue to progress workloads to maintain intensity without signs/symptoms of physical distress. Continue to progress workloads to maintain intensity without signs/symptoms of physical distress.   Average METs -- 2 2 1.8 2.1     Resistance Training   Training Prescription -- Yes Yes Yes Yes   Weight -- 2 lb 2 lb 2 lb 2 lb   Reps -- 10-15 10-15 10-15 10-15     Interval Training  Interval Training -- -- No No No     Treadmill   MPH -- -- -- -- 1.3   Grade -- -- -- -- 0   Minutes -- -- -- -- 15   METs -- -- -- -- 2     NuStep   Level -- 1 3 --  --   Minutes -- 30 30 -- --   METs -- 2 2.2  highest reported -- --     T5 Nustep   Level -- -- -- -- 1   Minutes -- -- -- -- 15   METs -- -- -- -- 1.9     Home Exercise Plan   Plans to continue exercise at -- -- Home (comment)  walking Home (comment)  walking Home (comment)  walking   Frequency -- -- Add 2 additional days to program exercise sessions. Add 2 additional days to program exercise sessions. Add 2 additional days to program exercise sessions.   Initial Home Exercises Provided -- -- 01/30/21 01/30/21 01/30/21    Row Name 03/17/21 1200 04/29/21 1500           Response to Exercise   Blood Pressure (Admit) 120/60 112/60      Blood Pressure (Exercise) 148/64 --      Blood Pressure (Exit) 120/60 110/58      Heart Rate (Admit) 56 bpm 47 bpm      Heart Rate (Exercise) 83 bpm 74 bpm      Heart Rate (Exit) 55 bpm 59 bpm      Oxygen Saturation (Admit) -- 96 %      Oxygen Saturation (Exercise) -- 98 %      Oxygen Saturation (Exit) -- 96 %      Rating of Perceived Exertion (Exercise) 14 13      Symptoms none none      Duration Continue with 30 min of aerobic exercise without signs/symptoms of physical distress. Continue with 30 min of aerobic exercise without signs/symptoms of physical distress.      Intensity THRR unchanged THRR unchanged             Progression      Progression Continue to progress workloads to maintain intensity without signs/symptoms of physical distress. Continue to progress workloads to maintain intensity without signs/symptoms of physical distress.      Average METs 2 2.13             Resistance Training      Training Prescription Yes Yes      Weight 2 lb 2 lb      Reps 10-15 10-15             Interval Training      Interval Training -- No             Treadmill      MPH 1.4 1.3      Grade 0 0      Minutes 15 15      METs 2 2             NuStep      Level -- 4      Minutes -- 15      METs -- 2.4             T5 Nustep      Level --  4      Minutes -- 15      METs -- 2  Home Exercise Plan      Plans to continue exercise at -- Home (comment)  walking      Frequency -- Add 2 additional days to program exercise sessions.      Initial Home Exercises Provided -- 01/30/21              Exercise Comments:   Exercise Comments     Row Name 01/07/21 0940 04/08/21 0959 05/06/21 1014       Exercise Comments First full day of exercise!  Patient was oriented to gym and equipment including functions, settings, policies, and procedures.  Patient's individual exercise prescription and treatment plan were reviewed.  All starting workloads were established based on the results of the 6 minute walk test done at initial orientation visit.  The plan for exercise progression was also introduced and progression will be customized based on patient's performance and goals. Pt returns with medical clearance (see note dated 03/26/21 in chart) from her cardiologist to return to cardiac rehab without restriction. Pt able to follow exercise prescription today without complaint.  Will continue to monitor for progression. Ishanvi graduated today from  rehab with 36 sessions completed.  Details of the patient's exercise prescription and what She needs to do in order to continue the prescription and progress were discussed with patient.  Patient was given a copy of prescription and goals.  Patient verbalized understanding.  Maria Trujillo plans to continue to exercise by walking at home.              Exercise Goals and Review:   Exercise Goals     Row Name 01/02/21 1548             Exercise Goals   Increase Physical Activity Yes       Intervention Provide advice, education, support and counseling about physical activity/exercise needs.;Develop an individualized exercise prescription for aerobic and resistive training based on initial evaluation findings, risk stratification, comorbidities and participant's personal goals.       Expected  Outcomes Short Term: Attend rehab on a regular basis to increase amount of physical activity.;Long Term: Add in home exercise to make exercise part of routine and to increase amount of physical activity.;Long Term: Exercising regularly at least 3-5 days a week.       Increase Strength and Stamina Yes       Intervention Provide advice, education, support and counseling about physical activity/exercise needs.;Develop an individualized exercise prescription for aerobic and resistive training based on initial evaluation findings, risk stratification, comorbidities and participant's personal goals.       Expected Outcomes Short Term: Increase workloads from initial exercise prescription for resistance, speed, and METs.;Long Term: Improve cardiorespiratory fitness, muscular endurance and strength as measured by increased METs and functional capacity (6MWT);Short Term: Perform resistance training exercises routinely during rehab and add in resistance training at home       Able to understand and use rate of perceived exertion (RPE) scale Yes       Intervention Provide education and explanation on how to use RPE scale       Expected Outcomes Short Term: Able to use RPE daily in rehab to express subjective intensity level;Long Term:  Able to use RPE to guide intensity level when exercising independently       Able to understand and use Dyspnea scale Yes       Intervention Provide education and explanation on how to use Dyspnea scale       Expected Outcomes Short Term:  Able to use Dyspnea scale daily in rehab to express subjective sense of shortness of breath during exertion;Long Term: Able to use Dyspnea scale to guide intensity level when exercising independently       Knowledge and understanding of Target Heart Rate Range (THRR) Yes       Intervention Provide education and explanation of THRR including how the numbers were predicted and where they are located for reference       Expected Outcomes Short Term:  Able to state/look up THRR;Short Term: Able to use daily as guideline for intensity in rehab;Long Term: Able to use THRR to govern intensity when exercising independently       Able to check pulse independently Yes       Intervention Provide education and demonstration on how to check pulse in carotid and radial arteries.;Review the importance of being able to check your own pulse for safety during independent exercise       Expected Outcomes Short Term: Able to explain why pulse checking is important during independent exercise;Long Term: Able to check pulse independently and accurately       Understanding of Exercise Prescription Yes       Intervention Provide education, explanation, and written materials on patient's individual exercise prescription       Expected Outcomes Short Term: Able to explain program exercise prescription;Long Term: Able to explain home exercise prescription to exercise independently                Exercise Goals Re-Evaluation :  Exercise Goals Re-Evaluation     Row Name 01/07/21 0940 01/21/21 1257 01/23/21 1027 01/30/21 1146 02/03/21 1356     Exercise Goal Re-Evaluation   Exercise Goals Review Increase Physical Activity;Able to understand and use rate of perceived exertion (RPE) scale;Knowledge and understanding of Target Heart Rate Range (THRR);Understanding of Exercise Prescription;Increase Strength and Stamina;Able to understand and use Dyspnea scale;Able to check pulse independently Increase Physical Activity;Increase Strength and Stamina Increase Physical Activity;Increase Strength and Stamina Increase Physical Activity;Increase Strength and Stamina Increase Physical Activity;Increase Strength and Stamina   Comments Reviewed RPE and dyspnea scales, THR and program prescription with pt today.  Pt voiced understanding and was given a copy of goals to take home. Maria Trujillo is tolerating exercise well.  She doesnt quite reach her THR range.  Staff will review THR. Maria Trujillo  does some yardwork when weather is nice. She tries to walk around the house every hour. EP will review home exercise with her. Reviewed home exercise with pt today.  Pt plans to walk/consider joining [facility] [place] for exercise.  Reviewed THR, pulse, RPE, sign and symptoms, pulse oximetery and when to call 911 or MD.  Also discussed weather considerations and indoor options.  Pt voiced understanding. Maria Trujillo is doing well in rehab.  She really likes the NuStep.  She has done level 3 one day and level 2 another day.  We will work with her to keep her consistent and monitor progress.   Expected Outcomes Short: Use RPE daily to regulate intensity. Long: Follow program prescription in THR. Short: work towards [place] range Long:  improve overall stamina Short: continue to attend rehab consistently Long:  improve overall stamina Short: exercise 1-2 days outside program sessions Long: improve stamina Short: Keep NuStep at level 3  Long: Continue to improve stamina    Row Name 02/18/21 1226 02/27/21 1005 03/04/21 1424 03/17/21 1233 04/01/21 1506     Exercise Goal Re-Evaluation   Exercise Goals Review Increase Physical Activity;Increase Strength  and Stamina Increase Physical Activity;Increase Strength and Stamina Increase Physical Activity;Increase Strength and Stamina;Understanding of Exercise Prescription Increase Physical Activity;Increase Strength and Stamina --   Comments Maria Trujillo saw her ortho Dr and he was very pleased her leg strength had improved!  She feels she is getting stronger. Maria Trujillo is walking outside HT classes.  She walks in her house and in her driveway.  We reviewed the importance of styaing hydrated. Maria Trujillo is doing well in rehab. She is now able to go without breaks! We will start to increase her workloads and monitor her progress. Maria Trujillo has moved up to 1.4 mph on TM.  She stays at 2 lb for strength work due to rotator cuff issues. Out since last review   Expected Outcomes Short:  continue to attend  consistently Long:  build strength and stamina Short:  continue to exercise consistently Long:  maintain exercise on her own Short: Increase workloads Long: COnitnue to improve stamina Short:increase workloads on T4  Long: continue to build stamina --    Maria Trujillo Name 04/08/21 0947 04/29/21 1505           Exercise Goal Re-Evaluation   Exercise Goals Review Increase Physical Activity;Increase Strength and Stamina Increase Physical Activity;Increase Strength and Stamina;Understanding of Exercise Prescription      Comments Maria Trujillo was walking outside HT classes. She walked in her house and in her driveway. She had not been exercising at home since she has been so weak. Maria Trujillo is nearing graduation.  She improved her post walk by 15%!!  She is planning to walk at home after graduation. She is still recovering from being out sick. We will continue to monitor her progress.      Expected Outcomes Short: continue to attend rehab and to get back into exercise after being out sick with COVID  Long: continue to build stamina Short: Get back to walking routine Long: Continue to improve stamina.               Discharge Exercise Prescription (Final Exercise Prescription Changes):  Exercise Prescription Changes - 04/29/21 1500       Response to Exercise   Blood Pressure (Admit) 112/60    Blood Pressure (Exit) 110/58    Heart Rate (Admit) 47 bpm    Heart Rate (Exercise) 74 bpm    Heart Rate (Exit) 59 bpm    Oxygen Saturation (Admit) 96 %    Oxygen Saturation (Exercise) 98 %    Oxygen Saturation (Exit) 96 %    Rating of Perceived Exertion (Exercise) 13    Symptoms none    Duration Continue with 30 min of aerobic exercise without signs/symptoms of physical distress.    Intensity THRR unchanged      Progression   Progression Continue to progress workloads to maintain intensity without signs/symptoms of physical distress.    Average METs 2.13      Resistance Training   Training Prescription Yes     Weight 2 lb    Reps 10-15      Interval Training   Interval Training No      Treadmill   MPH 1.3    Grade 0    Minutes 15    METs 2      NuStep   Level 4    Minutes 15    METs 2.4      T5 Nustep   Level 4    Minutes 15    METs 2      Home Exercise Plan  Plans to continue exercise at Home (comment)   walking   Frequency Add 2 additional days to program exercise sessions.    Initial Home Exercises Provided 01/30/21             Nutrition:  Target Goals: Understanding of nutrition guidelines, daily intake of sodium 1500mg , cholesterol 200mg , calories 30% from fat and 7% or less from saturated fats, daily to have 5 or more servings of fruits and vegetables.  Education: All About Nutrition: -Group instruction provided by verbal, written material, interactive activities, discussions, models, and posters to present general guidelines for heart healthy nutrition including fat, fiber, MyPlate, the role of sodium in heart healthy nutrition, utilization of the nutrition label, and utilization of this knowledge for meal planning. Follow up email sent as well. Written material given at graduation.   Biometrics:  Pre Biometrics - 01/02/21 1548       Pre Biometrics   Height 5' 3.25" (1.607 m)    Weight 188 lb 1.6 oz (85.3 kg)    BMI (Calculated) 33.04    Single Leg Stand 12.19 seconds             Post Biometrics - 04/22/21 1119        Post  Biometrics   Height 5' 3.25" (1.607 m)    Weight 178 lb 14.4 oz (81.1 kg)    BMI (Calculated) 31.42             Nutrition Therapy Plan and Nutrition Goals:  Nutrition Therapy & Goals - 01/08/21 1433       Nutrition Therapy   Diet Heart healthy, low Na    Drug/Food Interactions Statins/Certain Fruits    Protein (specify units) 65g    Fiber 25 grams    Whole Grain Foods 3 servings    Saturated Fats 12 max. grams    Fruits and Vegetables 5 servings/day    Sodium 1.5 grams      Personal Nutrition Goals   Nutrition  Goal ST: try whole wheat bread LT: continue with heart healthy changes    Comments She reports making changes for years before her heart event due to her husband having high blood pressure. She reports not frying and not using much grease, using lean meats. B: bowl of cereal (honeynut cheerios or raisin bran or toasted oats - one time a week will have sausage and egg. Will have coffee (black), she will also have a small glass of orange juice. L: sandwich with [place] or ham with some lettuce and mayo and mustard (white wheat or honey wheat) or some peanut butter crackers with ritz crackers and activia yogurt and tea. S: cookie or hershey candy. She enjoys oranges, grapes, and bananas. D: chicken tenders or boneless ribeye porkchop on george [employer]man grill with salad. She also enjoys pinto beans and turnip greens. Discussed heart healthy eating.      Intervention Plan   Intervention Prescribe, educate and counsel regarding individualized specific dietary modifications aiming towards targeted core components such as weight, hypertension, lipid management, diabetes, heart failure and other comorbidities.;Nutrition handout(s) given to patient.    Expected Outcomes Short Term Goal: Understand basic principles of dietary content, such as calories, fat, sodium, cholesterol and nutrients.;Short Term Goal: A plan has been developed with personal nutrition goals set during dietitian appointment.;Long Term Goal: Adherence to prescribed nutrition plan.             Nutrition Assessments:  MEDIFICTS Score Key: ?70 Need to make dietary changes  40-70  Heart Healthy Diet ? 40 Therapeutic Level Cholesterol Diet  Flowsheet Row Cardiac Rehab from 04/29/2021 in [facility] Cardiac and Pulmonary Rehab  Picture Your Plate Total Score on Admission 57  Picture Your Plate Total Score on Discharge 56      Picture Your Plate Scores: <16 Unhealthy dietary pattern with much room for improvement. 41-50 Dietary pattern unlikely to  meet recommendations for good health and room for improvement. 51-60 More healthful dietary pattern, with some room for improvement.  >60 Healthy dietary pattern, although there may be some specific behaviors that could be improved.    Nutrition Goals Re-Evaluation:  Nutrition Goals Re-Evaluation     [place] Name 01/23/21 1020 02/27/21 1007 04/08/21 0943 04/22/21 1025       Goals   Current Weight 186 lb 11.2 oz (84.7 kg) -- 182 lb 1.6 oz (82.6 kg) --    Nutrition Goal ST: add fruit to snack LT: continue with heart healthy changes -- ST: add fruit to snack LT: continue with heart healthy changes --    Comment She has tried whole wheat bread and reports liking it more than she thought she would. She has candy for an afternoon snack and has been trying to add fruit - has tried oranges. Maria Trujillo has cut down on red meat.  She uses a [employer].  She tries to have salad.  She also is watching portion sizes. She had drainage down her throat from being sick and it was irritating her. She reports her appetite was poor for the first week, but feels it is back to normal now. She reports maintaining healthy changes. Karrie feels her appetite is better since sinus stuff is cleared up.  She has lost 10 lb since starting HT and also has cut back on sweets.    Expected Outcome ST: add fruit to snack LT: continue with heart healthy changes ST/LT: maintain heart healthy eating ST: add fruit to snack LT: continue with heart healthy changes ST/LT : maintain heart healthy diet             Nutrition Goals Discharge (Final Nutrition Goals Re-Evaluation):  Nutrition Goals Re-Evaluation - 04/22/21 1025       Goals   Comment Maria Trujillo feels her appetite is better since sinus stuff is cleared up.  She has lost 10 lb since starting HT and also has cut back on sweets.    Expected Outcome ST/LT : maintain heart healthy diet             Psychosocial: Target Goals: Acknowledge presence or absence of significant  depression and/or stress, maximize coping skills, provide positive support system. Participant is able to verbalize types and ability to use techniques and skills needed for reducing stress and depression.   Education: Stress, Anxiety, and Depression - Group verbal and visual presentation to define topics covered.  Reviews how body is impacted by stress, anxiety, and depression.  Also discusses healthy ways to reduce stress and to treat/manage anxiety and depression.  Written material given at graduation. Flowsheet Row Cardiac Rehab from 04/17/2021 in [facility] Cardiac and Pulmonary Rehab  Date 01/30/21  Educator [facility]  Instruction Review Code 1- [employer] Understanding       Education: Sleep Hygiene -Provides group verbal and written instruction about how sleep can affect your health.  Define sleep hygiene, discuss sleep cycles and impact of sleep habits. Review good sleep hygiene tips.    Initial Review & Psychosocial Screening:  Initial Psych Review & Screening - 12/25/20 1408  Initial Review   Current issues with Current Stress Concerns    Source of Stress Concerns Unable to perform yard/household activities      [place]? Yes   son     Barriers   Psychosocial barriers to participate in program There are no identifiable barriers or psychosocial needs.;The patient should benefit from training in stress management and relaxation.      Screening Interventions   Interventions Provide feedback about the scores to participant;Encouraged to exercise;To provide support and resources with identified psychosocial needs    Expected Outcomes Short Term goal: Utilizing psychosocial counselor, staff and physician to assist with identification of specific Stressors or current issues interfering with healing process. Setting desired goal for each stressor or current issue identified.;Long Term Goal: Stressors or current issues are controlled or eliminated.;Short Term  goal: Identification and review with participant of any Quality of Life or Depression concerns found by scoring the questionnaire.;Long Term goal: The participant improves quality of Life and PHQ9 Scores as seen by post scores and/or verbalization of changes             Quality of Life Scores:   Quality of Life - 04/29/21 1139       Quality of Life Scores   Health/Function Pre 27.21 %    Health/Function Post 25.82 %    Health/Function % Change -5.11 %    Socioeconomic Pre 30 %    Socioeconomic Post 25.75 %    Socioeconomic % Change  -14.17 %    Psych/Spiritual Pre 30 %    Psych/Spiritual Post 26.53 %    Psych/Spiritual % Change -11.57 %    Family Pre 30 %    Family Post 30 %    Family % Change 0 %    GLOBAL Pre 28.74 %    GLOBAL Post 26.52 %    GLOBAL % Change -7.72 %            Scores of 19 and below usually indicate a poorer quality of life in these areas.  A difference of  2-3 points is a clinically meaningful difference.  A difference of 2-3 points in the total score of the Quality of Life Index has been associated with significant improvement in overall quality of life, self-image, physical symptoms, and general health in studies assessing change in quality of life.  PHQ-9: Recent Review Flowsheet Data     Depression screen [facility] 2/9 04/29/2021 01/02/2021   Decreased Interest 1 0   Down, Depressed, Hopeless 0 0   PHQ - 2 Score 1 0   Altered sleeping 1 0   Tired, decreased energy 0 2   Change in appetite 0 0   Feeling bad or failure about yourself  0 0   Trouble concentrating 0 0   Moving slowly or fidgety/restless 0 0   Suicidal thoughts 0 0   PHQ-9 Score 2 2   Difficult doing work/chores Not difficult at all Not difficult at all      Interpretation of Total Score  Total Score Depression Severity:  1-4 = Minimal depression, 5-9 = Mild depression, 10-14 = Moderate depression, 15-19 = Moderately severe depression, 20-27 = Severe depression   Psychosocial  Evaluation and Intervention:  Psychosocial Evaluation - 12/25/20 1420       Psychosocial Evaluation & Interventions   Interventions Encouraged to exercise with the program and follow exercise prescription    Comments Ms. Brodhead is feeling well post NSTEMI. She said  each day is better than the last. Her biggest goal/stressor is yardwork. She wants to make sure she can mow the yard so she can help her son and she thoroughly enjoys yardwork. She wants to make sure she is strong enough to continue to do so. Her balance sometimes gets in the way so she is encouraged to work hard in [place] to strengthen her stamina. She does not report any issues with depression, anxiety, or sleep.    Expected Outcomes Short: attend cardiac rehab for education and exercise. Long: develop and maintain positive self care habits    Continue Psychosocial Services  Follow up required by staff             Psychosocial Re-Evaluation:  Psychosocial Re-Evaluation     [place] Name 01/23/21 1024 04/08/21 0955 04/22/21 1027         Psychosocial Re-Evaluation   Current issues with None Identified None Identified --     Comments Dorothie reports she has been sleeping well - 1030pm-7am. She likes puzzles, read, gardening and yardwork. She has a good support sytem in her son and daughter as well as her friends. She is close to her niece who is close to her age. AmeLie reports she has still been sleeping well. She continues to puzzle and read - she has not been gardening or doing yardwork due to being sick. She has a good support sytem in her son and daughter as well as her friends as well as her niece. She had her support system help her when she was sick. She reports no current stressors. Dagny reports no change in stress anxiety or depression.     Expected Outcomes ST: continue stress reducing activities LT: continue with positive attitude ST: continue stress reducing activities LT: continue with positive attitude Short: execise  to help with stress Long: maintain positi e outlook     Interventions Encouraged to attend Cardiac Rehabilitation for the exercise Encouraged to attend Cardiac Rehabilitation for the exercise Encouraged to attend Cardiac Rehabilitation for the exercise     Continue Psychosocial Services  Follow up required by staff Follow up required by staff Follow up required by staff              Psychosocial Discharge (Final Psychosocial Re-Evaluation):  Psychosocial Re-Evaluation - 04/22/21 1027       Psychosocial Re-Evaluation   Comments Maria Trujillo reports no change in stress anxiety or depression.    Expected Outcomes Short: execise to help with stress Long: maintain positi e outlook    Interventions Encouraged to attend Cardiac Rehabilitation for the exercise    Continue Psychosocial Services  Follow up required by staff             Vocational Rehabilitation: Provide vocational rehab assistance to qualifying candidates.   Vocational Rehab Evaluation & Intervention:  Vocational Rehab - 04/29/21 1140       Initial Vocational Rehab Evaluation & Intervention   Assessment shows need for Vocational Rehabilitation No      Discharge Vocational Rehab   Discharge Vocational Rehabilitation NO need             Education: Education Goals: Education classes will be provided on a variety of topics geared toward better understanding of heart health and risk factor modification. Participant will state understanding/return demonstration of topics presented as noted by education test scores.  Learning Barriers/Preferences:  Learning Barriers/Preferences - 12/25/20 1408       Learning Barriers/Preferences   Learning Barriers None  Learning Preferences None             General Cardiac Education Topics:  AED/CPR: - Group verbal and written instruction with the use of models to demonstrate the basic use of the AED with the basic ABC's of resuscitation.   Anatomy and Cardiac  Procedures: - Group verbal and visual presentation and models provide information about basic cardiac anatomy and function. Reviews the testing methods done to diagnose heart disease and the outcomes of the test results. Describes the treatment choices: Medical Management, Angioplasty, or Coronary Bypass Surgery for treating various heart conditions including Myocardial Infarction, Angina, Valve Disease, and Cardiac Arrhythmias.  Written material given at graduation. Flowsheet Row Cardiac Rehab from 04/17/2021 in [facility] Cardiac and Pulmonary Rehab  Date 02/20/21  Educator SB  Instruction Review Code 1- Verbalizes Understanding       Medication Safety: - Group verbal and visual instruction to review commonly prescribed medications for heart and lung disease. Reviews the medication, class of the drug, and side effects. Includes the steps to properly store meds and maintain the prescription regimen.  Written material given at graduation. Flowsheet Row Cardiac Rehab from 04/17/2021 in [facility] Cardiac and Pulmonary Rehab  Date 01/09/21  Educator [facility]  Instruction Review Code 1- Verbalizes Understanding       Intimacy: - Group verbal instruction through game format to discuss how heart and lung disease can affect sexual intimacy. Written material given at graduation.. Flowsheet Row Cardiac Rehab from 04/17/2021 in [facility] Cardiac and Pulmonary Rehab  Date 04/17/21  Educator [facility]  Instruction Review Code 1- Verbalizes Understanding       Know Your Numbers and Heart Failure: - Group verbal and visual instruction to discuss disease risk factors for cardiac and pulmonary disease and treatment options.  Reviews associated critical values for Overweight/Obesity, Hypertension, Cholesterol, and Diabetes.  Discusses basics of heart failure: signs/symptoms and treatments.  Introduces Heart Failure Zone chart for action plan for heart failure.  Written material given at graduation. Flowsheet Row Cardiac Rehab from  04/17/2021 in [facility] Cardiac and Pulmonary Rehab  Date 03/20/21  Educator SB  Instruction Review Code 1- Verbalizes Understanding       Infection Prevention: - Provides verbal and written material to individual with discussion of infection control including proper hand washing and proper equipment cleaning during exercise session. Flowsheet Row Cardiac Rehab from 04/17/2021 in [facility] Cardiac and Pulmonary Rehab  Date 01/02/21  Educator AS  Instruction Review Code 1- Verbalizes Understanding       Falls Prevention: - Provides verbal and written material to individual with discussion of falls prevention and safety. Flowsheet Row Cardiac Rehab from 04/17/2021 in [facility] Cardiac and Pulmonary Rehab  Date 01/02/21  Educator AS  Instruction Review Code 1- Verbalizes Understanding       Other: -Provides group and verbal instruction on various topics (see comments)   Knowledge Questionnaire Score:  Knowledge Questionnaire Score - 04/29/21 1140       Knowledge Questionnaire Score   Pre Score 21/26 exercise nutrition    Post Score 24/26   MI Nutrition  Responses reviewed and she verbalized understanding             Core Components/Risk Factors/Patient Goals at Admission:  Personal Goals and Risk Factors at Admission - 01/02/21 1549       Core Components/Risk Factors/Patient Goals on Admission    Weight Management Yes    Intervention Weight Management: Develop a combined nutrition and exercise program designed to reach desired caloric  intake, while maintaining appropriate intake of nutrient and fiber, sodium and fats, and appropriate energy expenditure required for the weight goal.;Weight Management: Provide education and appropriate resources to help participant work on and attain dietary goals.    Admit Weight 188 lb 1.6 oz (85.3 kg)    Goal Weight: Short Term 185 lb (83.9 kg)    Goal Weight: Long Term 185 lb (83.9 kg)    Expected Outcomes Weight Maintenance: Understanding of the  daily nutrition guidelines, which includes 25-35% calories from fat, 7% or less cal from saturated fats, less than 200mg  cholesterol, less than 1.5gm of sodium, & 5 or more servings of fruits and vegetables daily;Understanding recommendations for meals to include 15-35% energy as protein, 25-35% energy from fat, 35-60% energy from carbohydrates, less than 200mg  of dietary cholesterol, 20-35 gm of total fiber daily;Understanding of distribution of calorie intake throughout the day with the consumption of 4-5 meals/snacks    Hypertension Yes    Intervention Provide education on lifestyle modifcations including regular physical activity/exercise, weight management, moderate sodium restriction and increased consumption of fresh fruit, vegetables, and low fat dairy, alcohol moderation, and smoking cessation.;Monitor prescription use compliance.    Expected Outcomes Short Term: Continued assessment and intervention until BP is < 140/72mm HG in hypertensive participants. < 130/18mm HG in hypertensive participants with diabetes, heart failure or chronic kidney disease.;Long Term: Maintenance of blood pressure at goal levels.    Lipids Yes    Intervention Provide education and support for participant on nutrition & aerobic/resistive exercise along with prescribed medications to achieve LDL 70mg , HDL >40mg .             Education:Diabetes - Individual verbal and written instruction to review signs/symptoms of diabetes, desired ranges of glucose level fasting, after meals and with exercise. Acknowledge that pre and post exercise glucose checks will be done for 3 sessions at entry of program.   Core Components/Risk Factors/Patient Goals Review:   Goals and Risk Factor Review     Row Name 01/23/21 1016 02/27/21 1000 04/08/21 0952 04/22/21 1021       Core Components/Risk Factors/Patient Goals Review   Personal Goals Review Weight Management/Obesity;Hypertension;Lipids Weight Management/Obesity;Hypertension  Weight Management/Obesity;Hypertension Weight Management/Obesity;Hypertension    Review Maria Trujillo reports since starting her weight has decreased about 1 pound - this morning she weighed 184.6lbs. She has a cuff, but does not check it at home. This am it was 142/70. She is still taking her medications as directed. Her LDL was 81 within normal range, but on the higher side of normal in January. She reports she has been making heart healthy diet changes for years. She gets the results of her heart monitor today. Maria Trujillo said her Dr said heart monitor report was ok last time and no changes in medication.  She has a 30 day monitor on now.  She went to ER 4/30 for palpitations.  She had been working outside and was possibly dehydrated.  She reports no symptoms during exercise.  She is checking her BP on days not at [facility]. She was in [employer] day weekend, when she had her monitor on. She will she the MD next week regaridng this. No changes in medications. She is still checking BP when not at rehab - she reports when sick it was higher than usual, but has returned to normal 130s/60s. Today BP was 152/64 - what it ran when it was a bit higher. Maria Trujillo has enjoyed doing the exercise.  She is checking onto the [facility]  to exercise when she completes HT.  Her weight is staying steady.  She does check BP at home - usually 117-125 over 50-60.    Expected Outcomes ST: on non-rehab days - check BP at home. LT: continue to monitor risk factos. Short:  follow up with Dr on monitor Long: continue to manage risk factors Short:  follow up with Dr on monitor Long: continue to manage risk factors Short: keep risk factors in check Long: manage risk factors long term             Core Components/Risk Factors/Patient Goals at Discharge (Final Review):   Goals and Risk Factor Review - 04/22/21 1021       Core Components/Risk Factors/Patient Goals Review   Personal Goals Review Weight Management/Obesity;Hypertension    Review Maria Trujillo  has enjoyed doing the exercise.  She is checking onto the [facility] to exercise when she completes HT.  Her weight is staying steady.  She does check BP at home - usually 117-125 over 50-60.    Expected Outcomes Short: keep risk factors in check Long: manage risk factors long term             ITP Comments:  ITP Comments     Row Name 12/25/20 1403 01/02/21 1559 01/07/21 0940 01/08/21 1501 01/15/21 1123   ITP Comments Initial telephone orientation completed. Diagnosis can be found in [facility] 1/9. EP orientation scheduled for Thursday 3/10 at 2pm. Completed 6MWT and gym orientation. Initial ITP created and sent for review to Dr. Emily Filbert, Medical Director. First full day of exercise!  Patient was oriented to gym and equipment including functions, settings, policies, and procedures.  Patient's individual exercise prescription and treatment plan were reviewed.  All starting workloads were established based on the results of the 6 minute walk test done at initial orientation visit.  The plan for exercise progression was also introduced and progression will be customized based on patient's performance and goals. Completed initial RD evaluation 30 Day review completed. Medical Director ITP review done, changes made as directed, and signed approval by Medical Director.    Row Name 02/12/21 [phone] 03/12/21 0956 03/25/21 0818 04/01/21 1505 04/01/21 1510   ITP Comments 30 Day review completed. Medical Director ITP review done, changes made as directed, and signed approval by Medical Director. 30 Day review completed. Medical Director ITP review done, changes made as directed, and signed approval by Medical Director.  2 visit in [place] called out today.  She was in ED on 03/22/21 for afib with RVR and will need a follow up prior to returning to rehab. [place] last attended on 5/26.  She has now been exposed to [place] and having symptoms.  She will be out. Patient called out of rehab- does not feel well and  currently  staying having symptoms. Tested negative for COVID, will test again later on. Ensure patient needs to be symptom free and negative before returning back. Patient understood.    Row Name 04/08/21 7106 04/09/21 0916 05/06/21 1014       ITP Comments Pt returns with medical clearance (see note dated 03/26/21 in chart) from her cardiologist to return to cardiac rehab without restriction. Pt able to follow exercise prescription today without complaint.  Will continue to monitor for progression. 30 Day review completed. Medical Director ITP review done, changes made as directed, and signed approval by Medical Director. Lexxi graduated today from  rehab with 36 sessions completed.  Details of the patient's exercise prescription and what  She needs to do in order to continue the prescription and progress were discussed with patient.  Patient was given a copy of prescription and goals.  Patient verbalized understanding.  Neveen plans to continue to exercise by walking at home.              Comments: discharge ITP

## 2021-05-07 ENCOUNTER — Encounter: Payer: Self-pay | Admitting: *Deleted

## 2021-05-19 DIAGNOSIS — I491 Atrial premature depolarization: Secondary | ICD-10-CM | POA: Diagnosis not present

## 2021-05-19 DIAGNOSIS — R001 Bradycardia, unspecified: Secondary | ICD-10-CM | POA: Diagnosis not present

## 2021-05-19 DIAGNOSIS — I214 Non-ST elevation (NSTEMI) myocardial infarction: Secondary | ICD-10-CM | POA: Diagnosis not present

## 2021-05-19 DIAGNOSIS — I2510 Atherosclerotic heart disease of native coronary artery without angina pectoris: Secondary | ICD-10-CM | POA: Diagnosis not present

## 2021-05-19 DIAGNOSIS — I10 Essential (primary) hypertension: Secondary | ICD-10-CM | POA: Diagnosis not present

## 2021-05-19 DIAGNOSIS — I480 Paroxysmal atrial fibrillation: Secondary | ICD-10-CM | POA: Diagnosis not present

## 2021-06-03 DIAGNOSIS — I480 Paroxysmal atrial fibrillation: Secondary | ICD-10-CM | POA: Diagnosis not present

## 2021-06-03 DIAGNOSIS — E039 Hypothyroidism, unspecified: Secondary | ICD-10-CM | POA: Diagnosis not present

## 2021-06-03 DIAGNOSIS — I2510 Atherosclerotic heart disease of native coronary artery without angina pectoris: Secondary | ICD-10-CM | POA: Diagnosis not present

## 2021-06-03 DIAGNOSIS — I10 Essential (primary) hypertension: Secondary | ICD-10-CM | POA: Diagnosis not present

## 2021-06-03 DIAGNOSIS — J301 Allergic rhinitis due to pollen: Secondary | ICD-10-CM | POA: Diagnosis not present

## 2021-06-03 DIAGNOSIS — K219 Gastro-esophageal reflux disease without esophagitis: Secondary | ICD-10-CM | POA: Diagnosis not present

## 2021-06-03 DIAGNOSIS — E7800 Pure hypercholesterolemia, unspecified: Secondary | ICD-10-CM | POA: Diagnosis not present

## 2021-06-03 DIAGNOSIS — G2581 Restless legs syndrome: Secondary | ICD-10-CM | POA: Diagnosis not present

## 2021-07-17 DIAGNOSIS — R001 Bradycardia, unspecified: Secondary | ICD-10-CM | POA: Diagnosis not present

## 2021-07-17 DIAGNOSIS — I214 Non-ST elevation (NSTEMI) myocardial infarction: Secondary | ICD-10-CM | POA: Diagnosis not present

## 2021-07-17 DIAGNOSIS — R002 Palpitations: Secondary | ICD-10-CM | POA: Diagnosis not present

## 2021-07-17 DIAGNOSIS — I2510 Atherosclerotic heart disease of native coronary artery without angina pectoris: Secondary | ICD-10-CM | POA: Diagnosis not present

## 2021-07-17 DIAGNOSIS — J302 Other seasonal allergic rhinitis: Secondary | ICD-10-CM | POA: Diagnosis not present

## 2021-07-17 DIAGNOSIS — I491 Atrial premature depolarization: Secondary | ICD-10-CM | POA: Diagnosis not present

## 2021-07-17 DIAGNOSIS — Z23 Encounter for immunization: Secondary | ICD-10-CM | POA: Diagnosis not present

## 2021-07-17 DIAGNOSIS — I10 Essential (primary) hypertension: Secondary | ICD-10-CM | POA: Diagnosis not present

## 2021-07-17 DIAGNOSIS — I480 Paroxysmal atrial fibrillation: Secondary | ICD-10-CM | POA: Diagnosis not present

## 2021-07-17 DIAGNOSIS — E7800 Pure hypercholesterolemia, unspecified: Secondary | ICD-10-CM | POA: Diagnosis not present

## 2021-08-19 DIAGNOSIS — R001 Bradycardia, unspecified: Secondary | ICD-10-CM | POA: Diagnosis not present

## 2021-08-19 DIAGNOSIS — Z79899 Other long term (current) drug therapy: Secondary | ICD-10-CM | POA: Diagnosis not present

## 2021-08-19 DIAGNOSIS — I2510 Atherosclerotic heart disease of native coronary artery without angina pectoris: Secondary | ICD-10-CM | POA: Diagnosis not present

## 2021-08-19 DIAGNOSIS — I10 Essential (primary) hypertension: Secondary | ICD-10-CM | POA: Diagnosis not present

## 2021-08-19 DIAGNOSIS — I491 Atrial premature depolarization: Secondary | ICD-10-CM | POA: Diagnosis not present

## 2021-08-19 DIAGNOSIS — I214 Non-ST elevation (NSTEMI) myocardial infarction: Secondary | ICD-10-CM | POA: Diagnosis not present

## 2021-08-19 DIAGNOSIS — I480 Paroxysmal atrial fibrillation: Secondary | ICD-10-CM | POA: Diagnosis not present

## 2021-08-25 DIAGNOSIS — Z961 Presence of intraocular lens: Secondary | ICD-10-CM | POA: Diagnosis not present

## 2021-08-25 DIAGNOSIS — H510 Palsy (spasm) of conjugate gaze: Secondary | ICD-10-CM | POA: Diagnosis not present

## 2021-08-25 DIAGNOSIS — H401133 Primary open-angle glaucoma, bilateral, severe stage: Secondary | ICD-10-CM | POA: Diagnosis not present

## 2021-09-11 DIAGNOSIS — R002 Palpitations: Secondary | ICD-10-CM | POA: Diagnosis not present

## 2021-09-11 DIAGNOSIS — Z79899 Other long term (current) drug therapy: Secondary | ICD-10-CM | POA: Diagnosis not present

## 2021-10-09 DIAGNOSIS — L298 Other pruritus: Secondary | ICD-10-CM | POA: Diagnosis not present

## 2021-10-09 DIAGNOSIS — L538 Other specified erythematous conditions: Secondary | ICD-10-CM | POA: Diagnosis not present

## 2021-10-09 DIAGNOSIS — L820 Inflamed seborrheic keratosis: Secondary | ICD-10-CM | POA: Diagnosis not present

## 2021-12-05 DIAGNOSIS — I10 Essential (primary) hypertension: Secondary | ICD-10-CM | POA: Diagnosis not present

## 2021-12-05 DIAGNOSIS — K219 Gastro-esophageal reflux disease without esophagitis: Secondary | ICD-10-CM | POA: Diagnosis not present

## 2021-12-05 DIAGNOSIS — I2510 Atherosclerotic heart disease of native coronary artery without angina pectoris: Secondary | ICD-10-CM | POA: Diagnosis not present

## 2021-12-05 DIAGNOSIS — E7800 Pure hypercholesterolemia, unspecified: Secondary | ICD-10-CM | POA: Diagnosis not present

## 2021-12-05 DIAGNOSIS — Z0000 Encounter for general adult medical examination without abnormal findings: Secondary | ICD-10-CM | POA: Diagnosis not present

## 2021-12-05 DIAGNOSIS — J301 Allergic rhinitis due to pollen: Secondary | ICD-10-CM | POA: Diagnosis not present

## 2021-12-05 DIAGNOSIS — E039 Hypothyroidism, unspecified: Secondary | ICD-10-CM | POA: Diagnosis not present

## 2021-12-05 DIAGNOSIS — I480 Paroxysmal atrial fibrillation: Secondary | ICD-10-CM | POA: Diagnosis not present

## 2021-12-08 DIAGNOSIS — I10 Essential (primary) hypertension: Secondary | ICD-10-CM | POA: Diagnosis not present

## 2021-12-08 DIAGNOSIS — E7800 Pure hypercholesterolemia, unspecified: Secondary | ICD-10-CM | POA: Diagnosis not present

## 2021-12-08 DIAGNOSIS — I214 Non-ST elevation (NSTEMI) myocardial infarction: Secondary | ICD-10-CM | POA: Diagnosis not present

## 2021-12-08 DIAGNOSIS — I491 Atrial premature depolarization: Secondary | ICD-10-CM | POA: Diagnosis not present

## 2021-12-08 DIAGNOSIS — R001 Bradycardia, unspecified: Secondary | ICD-10-CM | POA: Diagnosis not present

## 2021-12-08 DIAGNOSIS — Z23 Encounter for immunization: Secondary | ICD-10-CM | POA: Diagnosis not present

## 2021-12-08 DIAGNOSIS — I480 Paroxysmal atrial fibrillation: Secondary | ICD-10-CM | POA: Diagnosis not present

## 2021-12-22 DIAGNOSIS — H401133 Primary open-angle glaucoma, bilateral, severe stage: Secondary | ICD-10-CM | POA: Diagnosis not present

## 2021-12-26 DIAGNOSIS — R748 Abnormal levels of other serum enzymes: Secondary | ICD-10-CM | POA: Diagnosis not present

## 2022-01-09 DIAGNOSIS — I10 Essential (primary) hypertension: Secondary | ICD-10-CM | POA: Diagnosis not present

## 2022-01-09 DIAGNOSIS — I2510 Atherosclerotic heart disease of native coronary artery without angina pectoris: Secondary | ICD-10-CM | POA: Diagnosis not present

## 2022-01-09 DIAGNOSIS — I480 Paroxysmal atrial fibrillation: Secondary | ICD-10-CM | POA: Diagnosis not present

## 2022-01-09 DIAGNOSIS — E785 Hyperlipidemia, unspecified: Secondary | ICD-10-CM | POA: Diagnosis not present

## 2022-01-09 DIAGNOSIS — R7401 Elevation of levels of liver transaminase levels: Secondary | ICD-10-CM | POA: Diagnosis not present

## 2022-01-09 DIAGNOSIS — E039 Hypothyroidism, unspecified: Secondary | ICD-10-CM | POA: Diagnosis not present

## 2022-01-15 DIAGNOSIS — Z79890 Hormone replacement therapy (postmenopausal): Secondary | ICD-10-CM | POA: Diagnosis not present

## 2022-01-15 DIAGNOSIS — I10 Essential (primary) hypertension: Secondary | ICD-10-CM | POA: Diagnosis not present

## 2022-01-15 DIAGNOSIS — Z79899 Other long term (current) drug therapy: Secondary | ICD-10-CM | POA: Diagnosis not present

## 2022-01-15 DIAGNOSIS — I252 Old myocardial infarction: Secondary | ICD-10-CM | POA: Diagnosis not present

## 2022-01-15 DIAGNOSIS — Z882 Allergy status to sulfonamides status: Secondary | ICD-10-CM | POA: Diagnosis not present

## 2022-01-15 DIAGNOSIS — H401133 Primary open-angle glaucoma, bilateral, severe stage: Secondary | ICD-10-CM | POA: Diagnosis not present

## 2022-01-15 DIAGNOSIS — E785 Hyperlipidemia, unspecified: Secondary | ICD-10-CM | POA: Diagnosis not present

## 2022-01-15 DIAGNOSIS — I081 Rheumatic disorders of both mitral and tricuspid valves: Secondary | ICD-10-CM | POA: Diagnosis not present

## 2022-01-15 DIAGNOSIS — Z7982 Long term (current) use of aspirin: Secondary | ICD-10-CM | POA: Diagnosis not present

## 2022-01-15 DIAGNOSIS — E039 Hypothyroidism, unspecified: Secondary | ICD-10-CM | POA: Diagnosis not present

## 2022-01-15 DIAGNOSIS — Z7901 Long term (current) use of anticoagulants: Secondary | ICD-10-CM | POA: Diagnosis not present

## 2022-01-15 DIAGNOSIS — Z881 Allergy status to other antibiotic agents status: Secondary | ICD-10-CM | POA: Diagnosis not present

## 2022-01-15 DIAGNOSIS — Z885 Allergy status to narcotic agent status: Secondary | ICD-10-CM | POA: Diagnosis not present

## 2022-01-15 DIAGNOSIS — Z886 Allergy status to analgesic agent status: Secondary | ICD-10-CM | POA: Diagnosis not present

## 2022-01-15 DIAGNOSIS — Z880 Allergy status to penicillin: Secondary | ICD-10-CM | POA: Diagnosis not present

## 2022-01-15 DIAGNOSIS — Z888 Allergy status to other drugs, medicaments and biological substances status: Secondary | ICD-10-CM | POA: Diagnosis not present

## 2022-01-15 DIAGNOSIS — I2510 Atherosclerotic heart disease of native coronary artery without angina pectoris: Secondary | ICD-10-CM | POA: Diagnosis not present

## 2022-01-15 DIAGNOSIS — I480 Paroxysmal atrial fibrillation: Secondary | ICD-10-CM | POA: Diagnosis not present

## 2022-01-15 DIAGNOSIS — Z91013 Allergy to seafood: Secondary | ICD-10-CM | POA: Diagnosis not present

## 2022-01-15 DIAGNOSIS — R7401 Elevation of levels of liver transaminase levels: Secondary | ICD-10-CM | POA: Diagnosis not present

## 2022-01-23 DIAGNOSIS — R748 Abnormal levels of other serum enzymes: Secondary | ICD-10-CM | POA: Diagnosis not present

## 2022-02-08 IMAGING — CR DG CHEST 2V
2 series · 2 of 2 positions shown · non-contrast
Comparison: None.

CLINICAL DATA: Chest pain

EXAM:
CHEST - 2 VIEW

[chest lat]
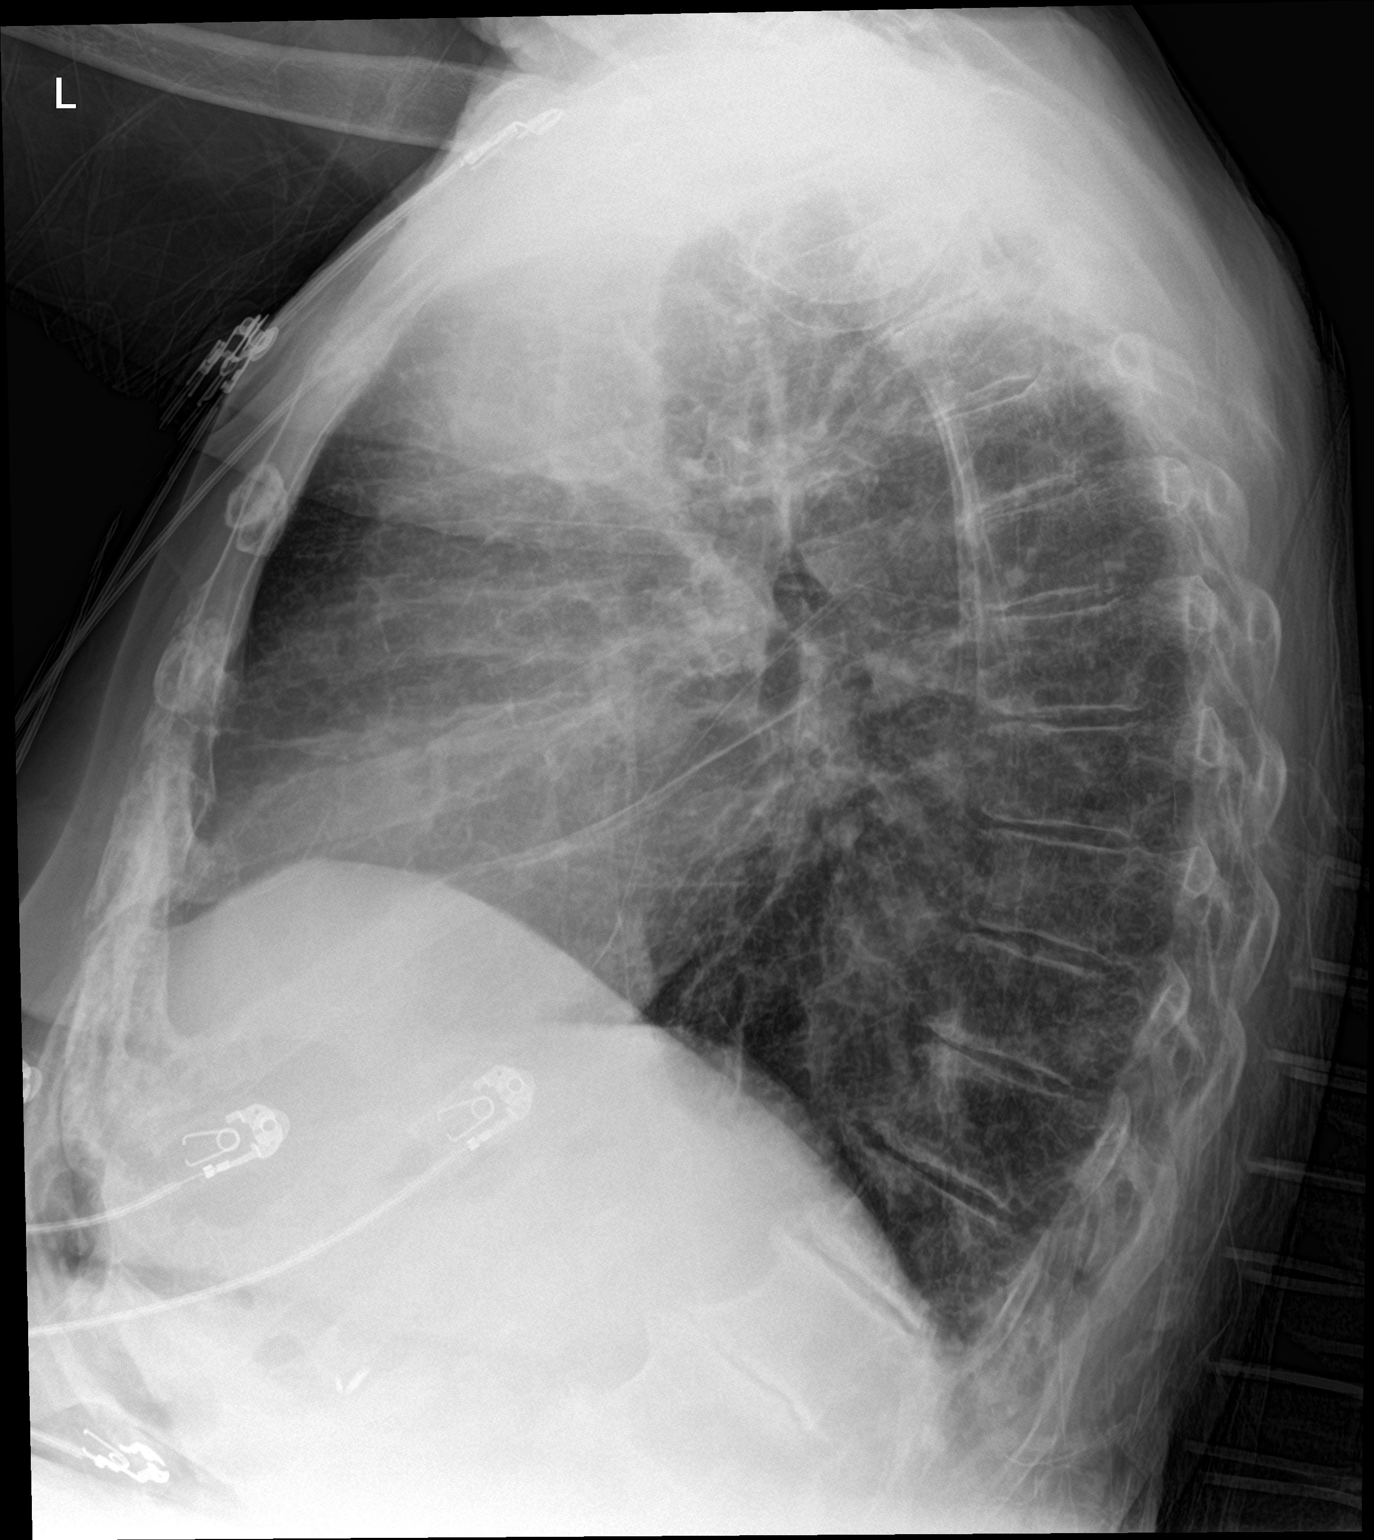

[chest ap]
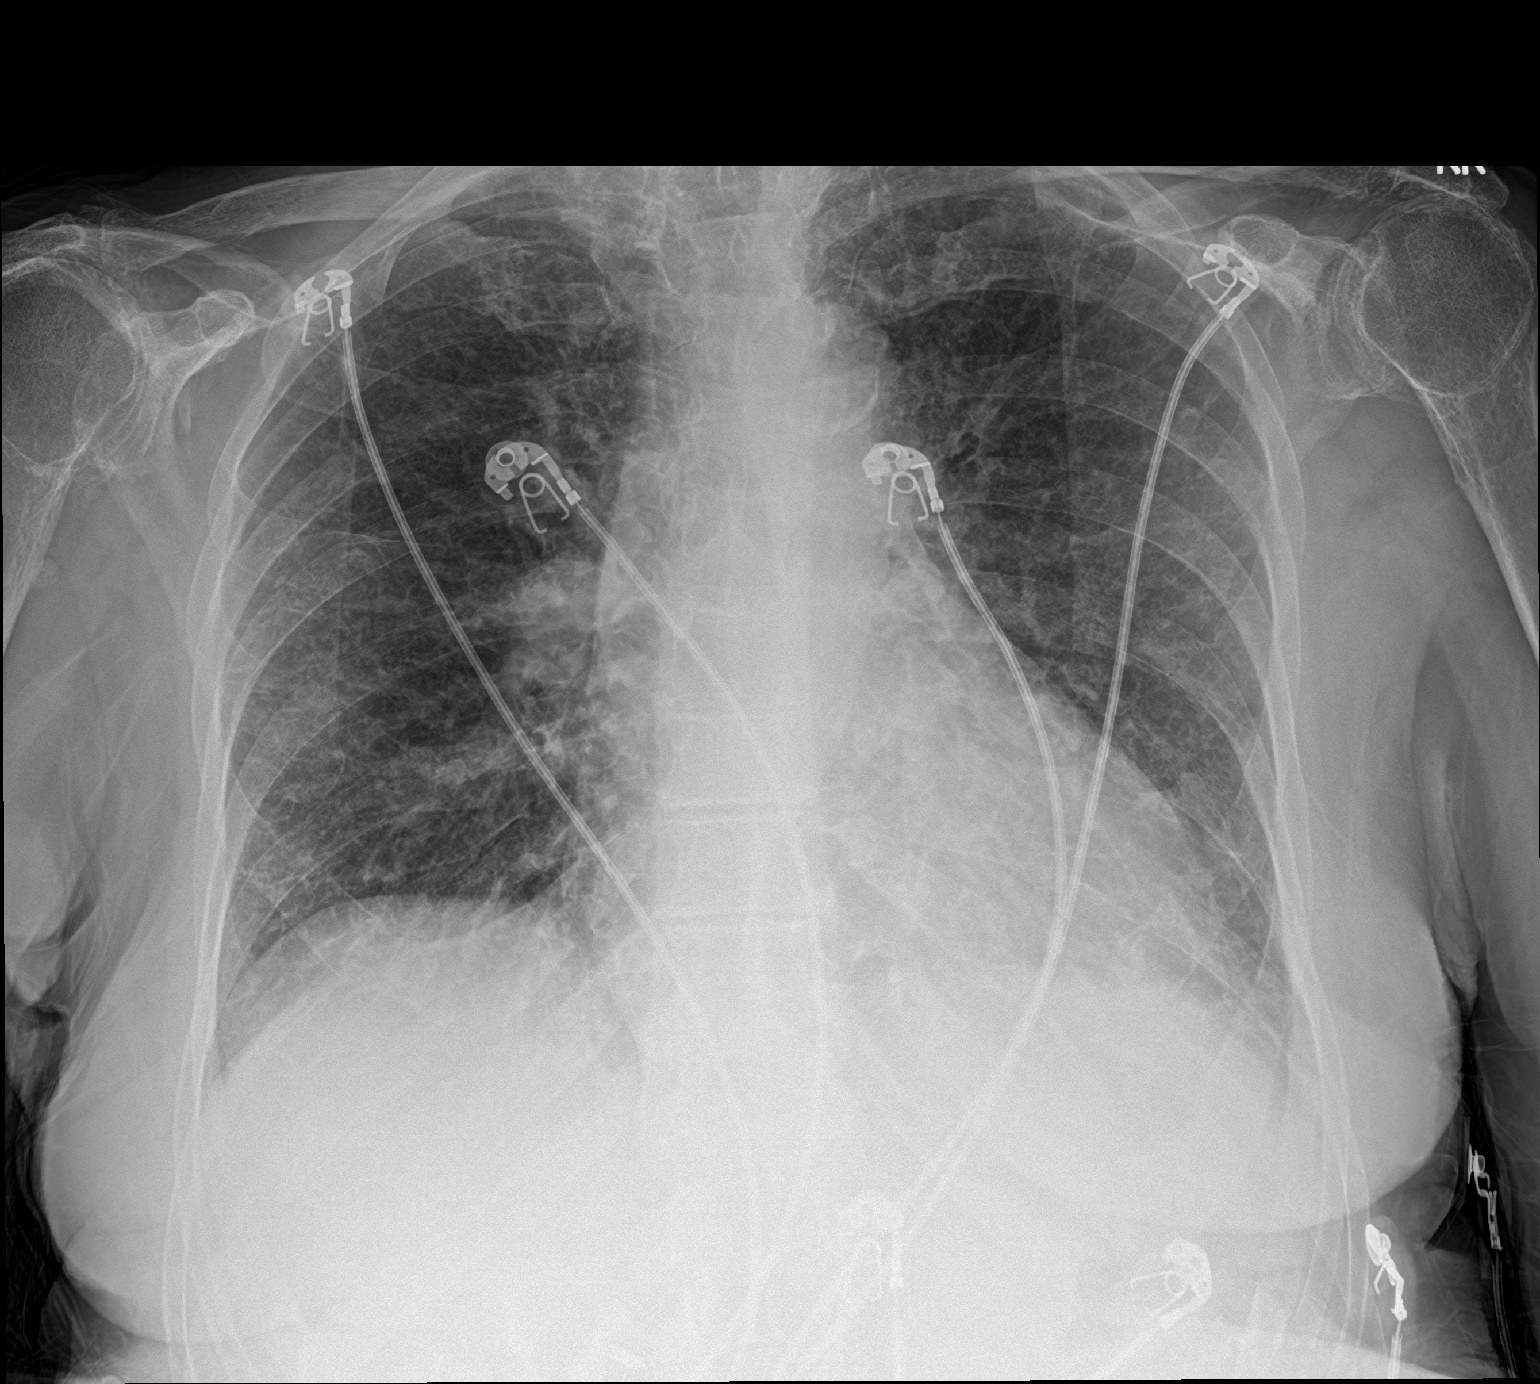

[2 of 2 positions shown; findings below may reference images not displayed]

FINDINGS: Borderline cardiomegaly. Coarse/interstitial lung markings
bilaterally suggesting chronic interstitial lung disease. No
confluent opacity to suggest consolidating pneumonia. No pleural
effusion or pneumothorax is seen. Mild degenerative spondylosis of
the kyphotic thoracic spine. No acute appearing osseous abnormality.
Atherosclerosis noted at the aortic arch.
IMPRESSION: 1. No active cardiopulmonary disease. No evidence of pneumonia or
pulmonary edema.
2. Probable chronic interstitial lung disease.
3. Borderline cardiomegaly.
4. Aortic atherosclerosis.

## 2022-02-17 DIAGNOSIS — Z96653 Presence of artificial knee joint, bilateral: Secondary | ICD-10-CM | POA: Diagnosis not present

## 2022-05-01 DIAGNOSIS — L570 Actinic keratosis: Secondary | ICD-10-CM | POA: Diagnosis not present

## 2022-05-01 DIAGNOSIS — L720 Epidermal cyst: Secondary | ICD-10-CM | POA: Diagnosis not present

## 2022-05-01 DIAGNOSIS — Z85828 Personal history of other malignant neoplasm of skin: Secondary | ICD-10-CM | POA: Diagnosis not present

## 2022-05-01 DIAGNOSIS — X32XXXA Exposure to sunlight, initial encounter: Secondary | ICD-10-CM | POA: Diagnosis not present

## 2022-05-01 DIAGNOSIS — D2262 Melanocytic nevi of left upper limb, including shoulder: Secondary | ICD-10-CM | POA: Diagnosis not present

## 2022-05-01 DIAGNOSIS — D225 Melanocytic nevi of trunk: Secondary | ICD-10-CM | POA: Diagnosis not present

## 2022-05-01 DIAGNOSIS — D2261 Melanocytic nevi of right upper limb, including shoulder: Secondary | ICD-10-CM | POA: Diagnosis not present

## 2022-05-29 ENCOUNTER — Encounter: Payer: Self-pay | Admitting: Emergency Medicine

## 2022-05-29 ENCOUNTER — Ambulatory Visit
Admission: EM | Admit: 2022-05-29 | Discharge: 2022-05-29 | Disposition: A | Discharge status: Home / Self Care | Payer: PPO | Attending: Emergency Medicine | Admitting: Emergency Medicine

## 2022-05-29 DIAGNOSIS — Z20822 Contact with and (suspected) exposure to covid-19: Secondary | ICD-10-CM | POA: Diagnosis not present

## 2022-05-29 DIAGNOSIS — E039 Hypothyroidism, unspecified: Secondary | ICD-10-CM | POA: Diagnosis not present

## 2022-05-29 DIAGNOSIS — Z882 Allergy status to sulfonamides status: Secondary | ICD-10-CM | POA: Diagnosis not present

## 2022-05-29 DIAGNOSIS — Z79890 Hormone replacement therapy (postmenopausal): Secondary | ICD-10-CM | POA: Diagnosis not present

## 2022-05-29 DIAGNOSIS — Z79899 Other long term (current) drug therapy: Secondary | ICD-10-CM | POA: Diagnosis not present

## 2022-05-29 DIAGNOSIS — Z87442 Personal history of urinary calculi: Secondary | ICD-10-CM | POA: Diagnosis not present

## 2022-05-29 DIAGNOSIS — Z7901 Long term (current) use of anticoagulants: Secondary | ICD-10-CM | POA: Insufficient documentation

## 2022-05-29 DIAGNOSIS — R350 Frequency of micturition: Secondary | ICD-10-CM | POA: Diagnosis not present

## 2022-05-29 DIAGNOSIS — Z880 Allergy status to penicillin: Secondary | ICD-10-CM | POA: Diagnosis not present

## 2022-05-29 DIAGNOSIS — J0100 Acute maxillary sinusitis, unspecified: Secondary | ICD-10-CM | POA: Diagnosis not present

## 2022-05-29 DIAGNOSIS — Z7982 Long term (current) use of aspirin: Secondary | ICD-10-CM | POA: Insufficient documentation

## 2022-05-29 DIAGNOSIS — Z87440 Personal history of urinary (tract) infections: Secondary | ICD-10-CM | POA: Insufficient documentation

## 2022-05-29 DIAGNOSIS — I4891 Unspecified atrial fibrillation: Secondary | ICD-10-CM | POA: Diagnosis not present

## 2022-05-29 DIAGNOSIS — I252 Old myocardial infarction: Secondary | ICD-10-CM | POA: Diagnosis not present

## 2022-05-29 DIAGNOSIS — R3129 Other microscopic hematuria: Secondary | ICD-10-CM | POA: Diagnosis not present

## 2022-05-29 LAB — URINALYSIS, MICROSCOPIC (REFLEX)

## 2022-05-29 LAB — URINALYSIS, ROUTINE W REFLEX MICROSCOPIC
Bilirubin Urine: NEGATIVE
Glucose, UA: NEGATIVE mg/dL
Ketones, ur: NEGATIVE mg/dL
Leukocytes,Ua: NEGATIVE
Nitrite: NEGATIVE
Protein, ur: NEGATIVE mg/dL
Specific Gravity, Urine: 1.01 (ref 1.005–1.030)
pH: 6.5 (ref 5.0–8.0)

## 2022-05-29 LAB — RESP PANEL BY RT-PCR (FLU A&B, COVID) ARPGX2
Influenza A by PCR: NEGATIVE
Influenza B by PCR: NEGATIVE
SARS Coronavirus 2 by RT PCR: NEGATIVE

## 2022-05-29 MED ORDER — DOXYCYCLINE HYCLATE 100 MG PO CAPS: 100.0000 mg | ORAL_CAPSULE | Freq: Two times a day (BID) | ORAL | 0 refills | Status: AC

## 2022-05-29 NOTE — Discharge Instructions (Addendum)
Finish the doxycycline even if you feel better, saline nasal irrigation with a Milta Deiters Med rinse and distilled water as often as you want, Tylenol.  continue the Astelin nasal spray.  I will will contact you and call in antivirals if COVID or flu are positive.  Discontinue Allegra, start Mucinex.  Follow-up with Dr. Ellison Hughs as needed.  You have a few bacteria and a small amount of blood in your urine.  However, your urine is not diagnostic for a urinary tract infection.  I am going to send your urine off for culture prior to initiating antibiotics for urinary tract infection.  I suspect that the frequency could be coming from the blood in your urine, which could be coming from small stones, or from the Eliquis.  I would have this rechecked in a week.  Go to the ER for fevers above 100.4, back pain, gross blood in the urine, vomiting, or altered mental status, for other concerns.

## 2022-05-29 NOTE — ED Provider Notes (Signed)
HPI  SUBJECTIVE:  Maria Trujillo is a 88 y.o. female who presents with "not feeling good" for the past week.  She has had nasal congestion, clear rhinorrhea, sinus pain and pressure, chills, and postnasal drip during this time that she states got worse last night.  She also reports nausea, chills, urinary frequency starting last night.  No facial swelling, upper dental pain, allergy symptoms, sore throat, cough, wheeze, shortness of breath.  No chest pain, palpitations, syncope.  No abdominal, back, pelvic pain.  No dysuria, urgency, cloudy or odorous urine, hematuria, vomiting, diarrhea.  No antibiotics in the past month.  She took Tylenol within 6 hours of evaluation with improvement in her chills.  She has also restarted her Astelin.  Her sinus pain and pressure is worse with bending/lying down.  She has a past medical history of atrial fibrillation on Eliquis, NSTEMI, remote history of UTI, and nonobstructing nephrolithiasis in 2016.  No history of pyelonephritis, pulmonary disease, chronic kidney disease, diabetes, hypertension.  [facility]: [facility] [place].  Additional history obtained from daughter.   Past Medical History:  Diagnosis Date   Arthritis    History of kidney stones    Hypothyroidism    Myocardial infarct (HCC)    PONV (postoperative nausea and vomiting)     Past Surgical History:  Procedure Laterality Date   BREAST EXCISIONAL BIOPSY Bilateral 1984   benign   CATARACT EXTRACTION, BILATERAL     CHOLECYSTECTOMY     EYE SURGERY     JOINT REPLACEMENT Left 12/2012   knee   KNEE ARTHROPLASTY Right 02/15/2017   Procedure: COMPUTER ASSISTED TOTAL KNEE ARTHROPLASTY;  Surgeon: Dereck Leep, MD;  Location: [facility];  Service: Orthopedics;  Laterality: Right;   LEFT HEART CATH AND CORONARY ANGIOGRAPHY N/A 11/04/2020   Procedure: LEFT HEART CATH AND CORONARY ANGIOGRAPHY;  Surgeon: Isaias Cowman, MD;  Location: [facility];  Service: Cardiovascular;  Laterality:  N/A;   PHOTOCOAGULATION WITH LASER Right 04/20/2018   Procedure: TRANSCLERAL DIODE CYCLOPHOTOCOAGULATION  RIGHT per Hope block;  Surgeon: Leandrew Koyanagi, MD;  Location: [facility];  Service: Ophthalmology;  Laterality: Right;   ROTATOR CUFF REPAIR Left 2010   TONSILLECTOMY      Family History  Problem Relation Age of Onset   Breast cancer Sister 59    Social History   Tobacco Use   Smoking status: Never   Smokeless tobacco: Never  Vaping Use   Vaping Use: Never used  Substance Use Topics   Alcohol use: No   Drug use: No    No current facility-administered medications for this encounter.  Current Outpatient Medications:    apixaban (ELIQUIS) 5 MG TABS tablet, Take 1 tablet (5 mg total) by mouth 2 (two) times daily., Disp: 60 tablet, Rfl: 1   aspirin 81 MG tablet, Take 81 mg by mouth at bedtime., Disp: , Rfl:    atorvastatin (LIPITOR) 80 MG tablet, Take 1 tablet (80 mg total) by mouth at bedtime., Disp: 30 tablet, Rfl: 2   bimatoprost (LUMIGAN) 0.03 % ophthalmic solution, Place 1 drop into both eyes at bedtime., Disp: , Rfl:    doxycycline (VIBRAMYCIN) 100 MG capsule, Take 1 capsule (100 mg total) by mouth 2 (two) times daily for 10 days., Disp: 20 capsule, Rfl: 0   isosorbide mononitrate (IMDUR) 30 MG 24 hr tablet, Take 1 tablet (30 mg total) by mouth daily., Disp: 30 tablet, Rfl: 2   levothyroxine (SYNTHROID, LEVOTHROID) 100 MCG tablet, Take 100 mcg by  mouth daily before breakfast., Disp: , Rfl:    lisinopril (ZESTRIL) 5 MG tablet, Take 1 tablet (5 mg total) by mouth daily., Disp: 30 tablet, Rfl: 2   metoprolol tartrate (LOPRESSOR) 25 MG tablet, Take 1 tablet (25 mg total) by mouth 2 (two) times daily., Disp: 60 tablet, Rfl: 2   Multiple Vitamin (MULTIVITAMIN WITH MINERALS) TABS tablet, Take 1 tablet by mouth daily., Disp: , Rfl:    Multiple Vitamins-Minerals (PRESERVISION AREDS 2 PO), Take 2 tablets by mouth 2 (two) times daily., Disp: , Rfl:    timolol  (TIMOPTIC) 0.5 % ophthalmic solution, Place 1 drop into both eyes daily., Disp: , Rfl:    acetaminophen (TYLENOL) 650 MG CR tablet, Take 650 mg by mouth daily as needed for pain., Disp: , Rfl:    Calcium Carb-Cholecalciferol (CALCIUM CARBONATE-VITAMIN D3) 600-400 MG-UNIT TABS, Take 1 tablet by mouth daily., Disp: , Rfl:    cholecalciferol (VITAMIN D) 1000 units tablet, Take 1,000 Units by mouth daily., Disp: , Rfl:    Omega-3 Fatty Acids (FISH OIL OMEGA-3) 1000 MG CAPS, Take 1,000 mg by mouth daily., Disp: , Rfl:   Allergies  Allergen Reactions   Dorzolamide Shortness Of Breath   Celecoxib Hives   Penicillins Other (See Comments)    Redness (sunburn type rash with peeling) Has patient had a PCN reaction causing immediate rash, facial/tongue/throat swelling, SOB or lightheadedness with hypotension: No Has patient had a PCN reaction causing severe rash involving mucus membranes or skin necrosis: No Has patient had a PCN reaction that required hospitalization No Has patient had a PCN reaction occurring within the last 10 years: Yes If all of the above answers are "NO", then may proceed with Cephalosporin use.    Oxycodone     BP dropped   Shellfish Allergy Hives and Itching   Sulfa Antibiotics Other (See Comments)    Redness in eyes   Amoxicillin Rash   Amoxicillin-Pot Clavulanate Other (See Comments)    Redness and flushing   Brimonidine Other (See Comments)    blisters   Cefuroxime Axetil Other (See Comments)    redness   Dipivefrin Other (See Comments)    sleepiness   Timolol Maleate Other (See Comments)    Redness     ROS  As noted in HPI.   Physical Exam  BP (!) 162/69 (BP Location: Left Arm)   Pulse (!) 56   Temp 97.6 F (36.4 C) (Oral)   Resp 14   Ht '5\' 3"'$  (1.6 m)   Wt 78.9 kg   SpO2 99%   BMI 30.82 kg/m   Constitutional: Well developed, well nourished, no acute distress Eyes:  EOMI, conjunctiva normal bilaterally HENT: Normocephalic, atraumatic,mucus  membranes moist.  Erythematous, but not swollen turbinates.  Positive maxillary sinus tenderness.  No frontal sinus tenderness.  Normal oropharynx.  No postnasal drip, cobblestoning. Neck: No cervical lymphadenopathy Respiratory: Normal inspiratory effort, lungs clear bilaterally Cardiovascular: Normal rate, regular rhythm, no murmurs rubs or gallops GI: nondistended, soft, nontender, active bowel sounds, no rebound, guarding.  Negative Murphy, negative McBurney Back: No CVAT skin: No rash, skin intact Musculoskeletal: no deformities Neurologic: Alert & oriented x 3, no focal neuro deficits Psychiatric: Speech and behavior appropriate   ED Course   Medications - No data to display  Orders Placed This Encounter  Procedures   Resp Panel by RT-PCR (Flu A&B, Covid) Anterior Nasal Swab    Standing Status:   Standing    Number of Occurrences:   1  Urine Culture    Standing Status:   Standing    Number of Occurrences:   1    Order Specific Question:   Indication    Answer:   Urgency/frequency   Urinalysis, Routine w reflex microscopic Urine, Clean Catch    Standing Status:   Standing    Number of Occurrences:   1   Urinalysis, Microscopic (reflex)    Standing Status:   Standing    Number of Occurrences:   1   Airborne and Contact precautions    Standing Status:   Standing    Number of Occurrences:   1    Results for orders placed or performed during the hospital encounter of 05/29/22 (from the past 24 hour(s))  Urinalysis, Routine w reflex microscopic Urine, Clean Catch     Status: Abnormal   Collection Time: 05/29/22  9:51 AM  Result Value Ref Range   Color, Urine YELLOW YELLOW   APPearance CLEAR CLEAR   Specific Gravity, Urine 1.010 1.005 - 1.030   pH 6.5 5.0 - 8.0   Glucose, UA NEGATIVE NEGATIVE mg/dL   Hgb urine dipstick SMALL (A) NEGATIVE   Bilirubin Urine NEGATIVE NEGATIVE   Ketones, ur NEGATIVE NEGATIVE mg/dL   Protein, ur NEGATIVE NEGATIVE mg/dL   Nitrite NEGATIVE  NEGATIVE   Leukocytes,Ua NEGATIVE NEGATIVE  Resp Panel by RT-PCR (Flu A&B, Covid) Anterior Nasal Swab     Status: None   Collection Time: 05/29/22  9:51 AM   Specimen: Anterior Nasal Swab  Result Value Ref Range   SARS Coronavirus 2 by RT PCR NEGATIVE NEGATIVE   Influenza A by PCR NEGATIVE NEGATIVE   Influenza B by PCR NEGATIVE NEGATIVE  Urinalysis, Microscopic (reflex)     Status: Abnormal   Collection Time: 05/29/22  9:51 AM  Result Value Ref Range   RBC / HPF 6-10 0 - 5 RBC/hpf   WBC, UA 0-5 0 - 5 WBC/hpf   Bacteria, UA RARE (A) NONE SEEN   Squamous Epithelial / LPF 0-5 0 - 5   Hyaline Casts, UA PRESENT    No results found.  ED Clinical Impression  1. Acute non-recurrent maxillary sinusitis   2. Urinary frequency   3. Microscopic hematuria      ED Assessment/Plan  UA has rare bacteria, but is negative for nitrites, esterase.  She has a small amount of hematuria.  will send this off for culture prior to initiating antibiotics for UTI.  Daughter and patient do not remember an allergy to cephalosporins.   Today's presentation consistent with a sinus infection.  Given acute worsening of symptoms, will send home with doxycycline, saline nasal irrigation, Tylenol.  She is to continue the Astelin nasal spray.  We will contact patient at [phone] and call in antivirals if COVID or flu are positive.  Discontinue Allegra, start Mucinex.  Follow-up with PCP as needed.  COVID, flu PCR negative.  Plan as above.  Discussed labs, MDM, treatment plan, and plan for follow-up with daughter and patient.  Written ER return precautions given..  They agree  with plan.   Meds ordered this encounter  Medications   doxycycline (VIBRAMYCIN) 100 MG capsule    Sig: Take 1 capsule (100 mg total) by mouth 2 (two) times daily for 10 days.    Dispense:  20 capsule    Refill:  0      *This clinic note was created using [employer]. Therefore, there may be occasional mistakes  despite careful proofreading.  ?  Melynda Ripple, MD 05/29/22 1101

## 2022-05-29 NOTE — ED Triage Notes (Signed)
Patient c/o urinary frequency that started last night.  Patient c/o sinus pressure and runny nose that last week.  Patient states that her drainage had gotten worse this morning.  Patient denies fevers.

## 2022-05-30 IMAGING — DX DG CHEST 1V PORT
1 series · 1 of 1 positions shown · non-contrast
Comparison: Prior radiograph from 11/03/2020.

CLINICAL DATA: Initial evaluation for acute chest pain.

EXAM:
PORTABLE CHEST 1 VIEW

[chest ap]
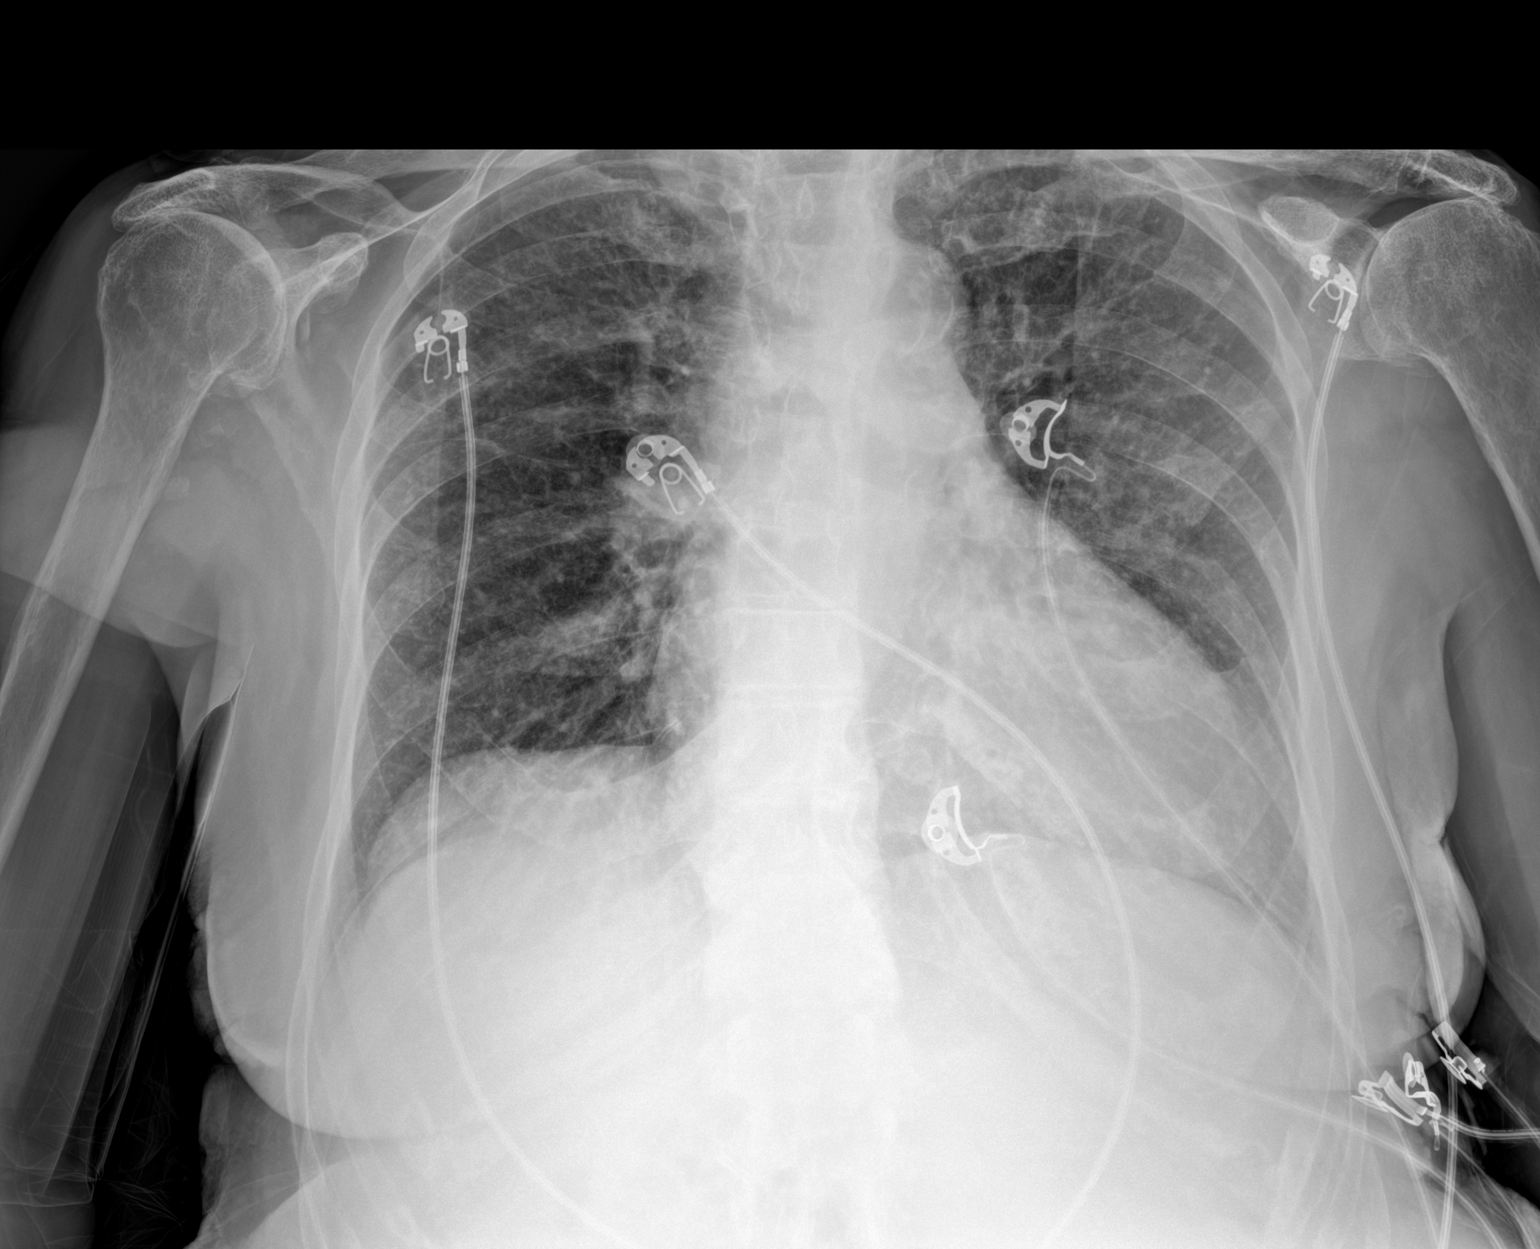

[1 of 1 positions shown; findings below may reference images not displayed]

FINDINGS: Cardiomegaly, stable. Mediastinal silhouette within normal limits.
Aortic atherosclerosis.

Lungs are well inflated. Diffuse interstitial prominence similar to
previous. No focal infiltrates. No overt pulmonary edema or visible
pleural effusion. No pneumothorax.

No acute osseous finding.
IMPRESSION: 1. No active cardiopulmonary disease.
2. Cardiomegaly without pulmonary edema.
3.  Aortic Atherosclerosis (46BC4-YYR.R).

## 2022-05-31 LAB — URINE CULTURE: Culture: NO GROWTH

## 2022-06-10 DIAGNOSIS — K219 Gastro-esophageal reflux disease without esophagitis: Secondary | ICD-10-CM | POA: Diagnosis not present

## 2022-06-10 DIAGNOSIS — E039 Hypothyroidism, unspecified: Secondary | ICD-10-CM | POA: Diagnosis not present

## 2022-06-10 DIAGNOSIS — E7800 Pure hypercholesterolemia, unspecified: Secondary | ICD-10-CM | POA: Diagnosis not present

## 2022-06-10 DIAGNOSIS — J301 Allergic rhinitis due to pollen: Secondary | ICD-10-CM | POA: Diagnosis not present

## 2022-06-10 DIAGNOSIS — I480 Paroxysmal atrial fibrillation: Secondary | ICD-10-CM | POA: Diagnosis not present

## 2022-06-10 DIAGNOSIS — I2510 Atherosclerotic heart disease of native coronary artery without angina pectoris: Secondary | ICD-10-CM | POA: Diagnosis not present

## 2022-06-10 DIAGNOSIS — I10 Essential (primary) hypertension: Secondary | ICD-10-CM | POA: Diagnosis not present

## 2022-06-12 DIAGNOSIS — H401133 Primary open-angle glaucoma, bilateral, severe stage: Secondary | ICD-10-CM | POA: Diagnosis not present

## 2022-06-12 DIAGNOSIS — Z961 Presence of intraocular lens: Secondary | ICD-10-CM | POA: Diagnosis not present

## 2022-06-12 DIAGNOSIS — H518 Other specified disorders of binocular movement: Secondary | ICD-10-CM | POA: Diagnosis not present

## 2022-06-15 DIAGNOSIS — I491 Atrial premature depolarization: Secondary | ICD-10-CM | POA: Diagnosis not present

## 2022-06-15 DIAGNOSIS — I2510 Atherosclerotic heart disease of native coronary artery without angina pectoris: Secondary | ICD-10-CM | POA: Diagnosis not present

## 2022-06-15 DIAGNOSIS — I214 Non-ST elevation (NSTEMI) myocardial infarction: Secondary | ICD-10-CM | POA: Diagnosis not present

## 2022-06-15 DIAGNOSIS — R001 Bradycardia, unspecified: Secondary | ICD-10-CM | POA: Diagnosis not present

## 2022-06-15 DIAGNOSIS — I480 Paroxysmal atrial fibrillation: Secondary | ICD-10-CM | POA: Diagnosis not present

## 2022-06-15 DIAGNOSIS — I10 Essential (primary) hypertension: Secondary | ICD-10-CM | POA: Diagnosis not present

## 2022-08-07 DIAGNOSIS — E039 Hypothyroidism, unspecified: Secondary | ICD-10-CM | POA: Diagnosis not present

## 2022-08-07 DIAGNOSIS — I480 Paroxysmal atrial fibrillation: Secondary | ICD-10-CM | POA: Diagnosis not present

## 2022-08-07 DIAGNOSIS — E7800 Pure hypercholesterolemia, unspecified: Secondary | ICD-10-CM | POA: Diagnosis not present

## 2022-08-07 DIAGNOSIS — I2510 Atherosclerotic heart disease of native coronary artery without angina pectoris: Secondary | ICD-10-CM | POA: Diagnosis not present

## 2022-08-07 DIAGNOSIS — I10 Essential (primary) hypertension: Secondary | ICD-10-CM | POA: Diagnosis not present

## 2022-09-15 DIAGNOSIS — H510 Palsy (spasm) of conjugate gaze: Secondary | ICD-10-CM | POA: Diagnosis not present

## 2022-09-15 DIAGNOSIS — Z961 Presence of intraocular lens: Secondary | ICD-10-CM | POA: Diagnosis not present

## 2022-09-15 DIAGNOSIS — H401133 Primary open-angle glaucoma, bilateral, severe stage: Secondary | ICD-10-CM | POA: Diagnosis not present

## 2022-11-18 ENCOUNTER — Emergency Department: Payer: PPO

## 2022-11-18 ENCOUNTER — Other Ambulatory Visit: Payer: Self-pay

## 2022-11-18 ENCOUNTER — Encounter: Payer: Self-pay | Admitting: Emergency Medicine

## 2022-11-18 ENCOUNTER — Emergency Department
Admission: EM | Admit: 2022-11-18 | Discharge: 2022-11-18 | Disposition: A | Discharge status: Home / Self Care | Payer: PPO | Attending: Emergency Medicine | Admitting: Emergency Medicine

## 2022-11-18 DIAGNOSIS — R000 Tachycardia, unspecified: Secondary | ICD-10-CM | POA: Diagnosis not present

## 2022-11-18 DIAGNOSIS — E039 Hypothyroidism, unspecified: Secondary | ICD-10-CM | POA: Insufficient documentation

## 2022-11-18 DIAGNOSIS — I480 Paroxysmal atrial fibrillation: Secondary | ICD-10-CM | POA: Diagnosis not present

## 2022-11-18 DIAGNOSIS — I4891 Unspecified atrial fibrillation: Secondary | ICD-10-CM | POA: Diagnosis not present

## 2022-11-18 DIAGNOSIS — R002 Palpitations: Secondary | ICD-10-CM | POA: Diagnosis not present

## 2022-11-18 DIAGNOSIS — I499 Cardiac arrhythmia, unspecified: Secondary | ICD-10-CM | POA: Diagnosis not present

## 2022-11-18 LAB — BASIC METABOLIC PANEL
Anion gap: 8 (ref 5–15)
BUN: 19 mg/dL (ref 8–23)
CO2: 21 mmol/L — ABNORMAL LOW (ref 22–32)
Calcium: 8.9 mg/dL (ref 8.9–10.3)
Chloride: 110 mmol/L (ref 98–111)
Creatinine, Ser: 0.76 mg/dL (ref 0.44–1.00)
GFR, Estimated: >60 mL/min (ref >60)
Glucose, Bld: 95 mg/dL (ref 70–99)
Potassium: 4.5 mmol/L (ref 3.5–5.1)
Sodium: 139 mmol/L (ref 135–145)

## 2022-11-18 LAB — CBC WITH DIFFERENTIAL/PLATELET
Abs Immature Granulocytes: 0.02 10*3/uL (ref 0.00–0.07)
Basophils Absolute: 0 10*3/uL (ref 0.0–0.1)
Basophils Relative: 1 %
Eosinophils Absolute: 0.3 10*3/uL (ref 0.0–0.5)
Eosinophils Relative: 5 %
HCT: 35.2 % — ABNORMAL LOW (ref 36.0–46.0)
Hemoglobin: 11.1 g/dL — ABNORMAL LOW (ref 12.0–15.0)
Immature Granulocytes: 0 %
Lymphocytes Relative: 25 %
Lymphs Abs: 1.3 10*3/uL (ref 0.7–4.0)
MCH: 29.8 pg (ref 26.0–34.0)
MCHC: 31.5 g/dL (ref 30.0–36.0)
MCV: 94.6 fL (ref 80.0–100.0)
Monocytes Absolute: 0.5 10*3/uL (ref 0.1–1.0)
Monocytes Relative: 9 %
Neutro Abs: 3 10*3/uL (ref 1.7–7.7)
Neutrophils Relative %: 60 %
Platelets: 126 10*3/uL — ABNORMAL LOW (ref 150–400)
RBC: 3.72 MIL/uL — ABNORMAL LOW (ref 3.87–5.11)
RDW: 13.5 % (ref 11.5–15.5)
WBC: 5 10*3/uL (ref 4.0–10.5)
nRBC: 0 % (ref 0.0–0.2)

## 2022-11-18 LAB — MAGNESIUM: Magnesium: 1.9 mg/dL (ref 1.7–2.4)

## 2022-11-18 NOTE — ED Provider Notes (Signed)
[facility] Provider Note    Event Date/Time   First MD Initiated Contact with Patient 11/18/22 [phone]     (approximate)   History   Tachycardia   HPI  Maria Trujillo is a 89 y.o. female       On review of previous urgent care note patient has a history of kidney stones, hypothyroidism, prior myocardial infarction  Note from Dr. Berneice Gandy cardiology notes the patient has a history of atrial fibrillation and is taking Eliquis as of August, had her metoprolol reduced to 12 and half milligrams due to lightheadedness and bradycardia  This morning she again asked that experiencing a feeling when she lays on her left side like her chest is by beating irregularly.  At times it feels little fast.  No shortness of breath no chest pain with this.  She is experienced same symptoms before.  Her daughter and told her to take 12 and half milligrams of her metoprolol which her cardiologist, Dr. Saralyn Pilar, I advised her to do when this happens.  She did so and then came to the ER  Right now resting comfortably.  She feels okay.  She still recognizes a little bit of irregularity or slight palpitation but no other symptoms.  She reports this happened to her a couple times in the past.  She has not been able to tolerate higher dose of medications as when she comes out of the A-fib her heart rate will be slow sometimes in the 40s and her blood pressure will get low and she will feel lightheaded.  Physical Exam   Triage Vital Signs: ED Triage Vitals  Enc Vitals Group     BP 11/18/22 0642 99/64     Pulse Rate 11/18/22 0642 92     Resp 11/18/22 0642 18     Temp 11/18/22 0642 97.7 F (36.5 C)     Temp Source 11/18/22 0642 Oral     SpO2 11/18/22 0642 100 %     Weight 11/18/22 0638 175 lb (79.4 kg)     Height 11/18/22 0638 '5\' 5"'$  (1.651 m)     Head Circumference --      Peak Flow --      Pain Score 11/18/22 0638 0     Pain Loc --      Pain Edu? --      Excl. in [place]? --      Most recent vital signs: Vitals:   11/18/22 0800 11/18/22 0811  BP: (!) 126/90 (!) 126/90  Pulse: 100 (!) 103  Resp: 17 16  Temp:    SpO2: 100% 100%     General: Awake, no distress.  She and her daughter both very pleasant. CV:  Good peripheral perfusion.  Normal tones rate about 90-100 slight variability and irregular Resp:  Normal effort.  Clear bilateral speaks in full clear sentences Abd:  No distention.  Soft nondistended Other:  No edema.  Does not appear hypervolemic   ED Results / Procedures / Treatments   Labs (all labs ordered are listed, but only abnormal results are displayed) Labs Reviewed  CBC WITH DIFFERENTIAL/PLATELET - Abnormal; Notable for the following components:      Result Value   RBC 3.72 (*)    Hemoglobin 11.1 (*)    HCT 35.2 (*)    Platelets 126 (*)    All other components within normal limits  BASIC METABOLIC PANEL - Abnormal; Notable for the following components:   CO2 21 (*)  All other components within normal limits  MAGNESIUM     EKG  EKG interpreted by me at 645 heart rate 102 QRS 90 QTc 440 Atrial fibrillation, mild rapid ventricular response with tachycardia to a rate of approximately 100   RADIOLOGY Chest x-ray interpreted as negative for acute finding or infiltrate   Interpreted by radiologist radiologist did note chronic interstitial changes and evidence of mild vascular fullness   PROCEDURES:  Critical Care performed: No  Procedures   MEDICATIONS ORDERED IN ED: Medications - No data to display   IMPRESSION / MDM / [place] / ED COURSE  I reviewed the triage vital signs and the nursing notes.                              Differential diagnosis includes, but is not limited to, paroxysmal A-fib, arrhythmia, electrolyte abnormality, acute anemia, etc.  Patient has had this problem before been treated for it, she took her metoprolol as instructed by her cardiologist.  At this point she is minimally  symptomatic, has no concerning symptoms such as chest pain or trouble breathing no lightheadedness.  Heart rate is controlled now 90-110.  Rechecked her blood pressure at 7:40 PM it is 505 systolic.  She is fully awake and alert and oriented  Discussed her case with her cardiologist Dr. Neldon Newport shows, he advises no changes with the current plan of care for her.  Recommends close outpatient follow-up.  Discussed with patient, and also given her clinical history we will keep her medications the same, provided some reassurance, and I think the recommended pathway of Dr. Neldon Newport shows I had prescribed her of taking 12.5 mg metoprolol twice daily with the addition of 1 additional tablet if experiencing concerning palpitations is quite reasonable.  Discussed careful return precautions with the patient and her daughter both in agreement with the plan.  CBC without appreciable acute change.  Metabolic panel normal  Patient's presentation is most consistent with acute complicated illness / injury requiring diagnostic workup.   The patient is on the cardiac monitor to evaluate for evidence of arrhythmia and/or significant heart rate changes.   Return precautions and treatment recommendations and follow-up discussed with the patient who is agreeable with the plan.      FINAL CLINICAL IMPRESSION(S) / ED DIAGNOSES   Final diagnoses:  Paroxysmal atrial fibrillation ([place])     Rx / DC Orders   ED Discharge Orders     None        Note:  This document was prepared using Dragon voice recognition software and may include unintentional dictation errors.   Delman Kitten, MD 11/18/22 [phone]

## 2022-11-18 NOTE — ED Provider Triage Note (Signed)
Emergency Medicine Provider Triage Evaluation Note  Maria Trujillo , a 89 y.o. female  was evaluated in triage.  Pt complains of palpitations  Review of Systems  Positive: Palpitations Negative: Chest pain, shortness of breath  Physical Exam  BP 99/64 (BP Location: Left Arm)   Pulse 92   Temp 97.7 F (36.5 C) (Oral)   Resp 18   Ht '5\' 5"'$  (1.651 m)   Wt 79.4 kg   SpO2 100%   BMI 29.12 kg/m  Gen:   Awake, no distress   Resp:  Normal effort  MSK:   Moves extremities without difficulty  Other:  Heart rate is irregularly irregular in the 80s to low 100s.  Medical Decision Making  Medically screening exam initiated at 6:47 AM.  Appropriate orders placed.  Maria Trujillo was informed that the remainder of the evaluation will be completed by another provider, this initial triage assessment does not replace that evaluation, and the importance of remaining in the ED until their evaluation is complete.  Patient here with A-fib with RVR that seems to have improved after receiving 10 mg of IV diltiazem with EMS.  Heart rate now in the 80s to low 100s.  She is feeling better without chest pain or shortness of breath.  Will obtain labs, chest x-ray and keep on cardiac monitoring.  Patient to be seen by oncoming ED provider at [place], Maria Bison, DO 11/18/22 [phone]

## 2022-11-18 NOTE — ED Triage Notes (Signed)
Pt arrived via [facility] from home with reports of palpitations, pt reported feeling like her heart was racing, pt has hx of same and is on metoprolol and eliquis for afib.  On EMS arrival pt was in afib with rate of 120-180, pt was given fluid bolus of about 200cc with no improvement and then '10mg'$  Cardizem IV which brought heart rate down to 70-120. Denies any CP at this time.

## 2022-11-24 ENCOUNTER — Telehealth: Payer: Self-pay

## 2022-11-24 NOTE — Telephone Encounter (Signed)
        Patient  visited [place] on 1/24     Telephone encounter attempt :  1st  A HIPAA compliant voice message was left requesting a return call.  Instructed patient to call back    Arieal Cuoco [facility], [place] [phone] [address], [place],  [address] Phone: [phone] Email: [email or web address]       

## 2022-11-25 ENCOUNTER — Telehealth: Payer: Self-pay

## 2022-11-25 NOTE — Telephone Encounter (Signed)
        Patient  visited [place] on 1/24    Telephone encounter attempt :  2nd  A HIPAA compliant voice message was left requesting a return call.  Instructed patient to call back       Maria Trujillo [facility], [place] [phone] [address], , [place] [address] Phone: [phone] Email: [email or web address]    

## 2022-11-26 DIAGNOSIS — J069 Acute upper respiratory infection, unspecified: Secondary | ICD-10-CM | POA: Diagnosis not present

## 2022-11-26 DIAGNOSIS — I480 Paroxysmal atrial fibrillation: Secondary | ICD-10-CM | POA: Diagnosis not present

## 2022-12-08 DIAGNOSIS — R001 Bradycardia, unspecified: Secondary | ICD-10-CM | POA: Diagnosis not present

## 2022-12-08 DIAGNOSIS — Z23 Encounter for immunization: Secondary | ICD-10-CM | POA: Diagnosis not present

## 2022-12-08 DIAGNOSIS — I480 Paroxysmal atrial fibrillation: Secondary | ICD-10-CM | POA: Diagnosis not present

## 2022-12-08 DIAGNOSIS — I10 Essential (primary) hypertension: Secondary | ICD-10-CM | POA: Diagnosis not present

## 2022-12-08 DIAGNOSIS — I491 Atrial premature depolarization: Secondary | ICD-10-CM | POA: Diagnosis not present

## 2022-12-08 DIAGNOSIS — R002 Palpitations: Secondary | ICD-10-CM | POA: Diagnosis not present

## 2022-12-08 DIAGNOSIS — I214 Non-ST elevation (NSTEMI) myocardial infarction: Secondary | ICD-10-CM | POA: Diagnosis not present

## 2022-12-08 DIAGNOSIS — E7800 Pure hypercholesterolemia, unspecified: Secondary | ICD-10-CM | POA: Diagnosis not present

## 2022-12-15 DIAGNOSIS — I2510 Atherosclerotic heart disease of native coronary artery without angina pectoris: Secondary | ICD-10-CM | POA: Diagnosis not present

## 2022-12-15 DIAGNOSIS — K219 Gastro-esophageal reflux disease without esophagitis: Secondary | ICD-10-CM | POA: Diagnosis not present

## 2022-12-15 DIAGNOSIS — Z0000 Encounter for general adult medical examination without abnormal findings: Secondary | ICD-10-CM | POA: Diagnosis not present

## 2022-12-15 DIAGNOSIS — J301 Allergic rhinitis due to pollen: Secondary | ICD-10-CM | POA: Diagnosis not present

## 2022-12-15 DIAGNOSIS — R748 Abnormal levels of other serum enzymes: Secondary | ICD-10-CM | POA: Diagnosis not present

## 2022-12-15 DIAGNOSIS — E7800 Pure hypercholesterolemia, unspecified: Secondary | ICD-10-CM | POA: Diagnosis not present

## 2022-12-15 DIAGNOSIS — I10 Essential (primary) hypertension: Secondary | ICD-10-CM | POA: Diagnosis not present

## 2022-12-15 DIAGNOSIS — E039 Hypothyroidism, unspecified: Secondary | ICD-10-CM | POA: Diagnosis not present

## 2022-12-15 DIAGNOSIS — I480 Paroxysmal atrial fibrillation: Secondary | ICD-10-CM | POA: Diagnosis not present

## 2022-12-29 DIAGNOSIS — H401133 Primary open-angle glaucoma, bilateral, severe stage: Secondary | ICD-10-CM | POA: Diagnosis not present

## 2023-01-04 DIAGNOSIS — I2510 Atherosclerotic heart disease of native coronary artery without angina pectoris: Secondary | ICD-10-CM | POA: Diagnosis not present

## 2023-01-04 DIAGNOSIS — E7800 Pure hypercholesterolemia, unspecified: Secondary | ICD-10-CM | POA: Diagnosis not present

## 2023-01-04 DIAGNOSIS — I480 Paroxysmal atrial fibrillation: Secondary | ICD-10-CM | POA: Diagnosis not present

## 2023-01-04 DIAGNOSIS — I10 Essential (primary) hypertension: Secondary | ICD-10-CM | POA: Diagnosis not present

## 2023-01-04 DIAGNOSIS — R0602 Shortness of breath: Secondary | ICD-10-CM | POA: Diagnosis not present

## 2023-01-04 DIAGNOSIS — R002 Palpitations: Secondary | ICD-10-CM | POA: Diagnosis not present

## 2023-01-04 DIAGNOSIS — I4891 Unspecified atrial fibrillation: Secondary | ICD-10-CM | POA: Diagnosis not present

## 2023-01-04 DIAGNOSIS — I491 Atrial premature depolarization: Secondary | ICD-10-CM | POA: Diagnosis not present

## 2023-01-04 DIAGNOSIS — R001 Bradycardia, unspecified: Secondary | ICD-10-CM | POA: Diagnosis not present

## 2023-01-21 DIAGNOSIS — R748 Abnormal levels of other serum enzymes: Secondary | ICD-10-CM | POA: Diagnosis not present

## 2023-03-04 ENCOUNTER — Ambulatory Visit: Payer: HMO | Admitting: Registered Nurse

## 2023-03-04 ENCOUNTER — Ambulatory Visit
Admission: RE | Admit: 2023-03-04 | Discharge: 2023-03-04 | Disposition: A | Discharge status: Home / Self Care | Payer: HMO | Source: Ambulatory Visit | Attending: Cardiology | Admitting: Cardiology

## 2023-03-04 ENCOUNTER — Encounter
Admission: RE | Disposition: A | Discharge status: Home / Self Care | Payer: Self-pay | Source: Ambulatory Visit | Attending: Cardiology

## 2023-03-04 DIAGNOSIS — Z9049 Acquired absence of other specified parts of digestive tract: Secondary | ICD-10-CM | POA: Insufficient documentation

## 2023-03-04 DIAGNOSIS — K219 Gastro-esophageal reflux disease without esophagitis: Secondary | ICD-10-CM | POA: Insufficient documentation

## 2023-03-04 DIAGNOSIS — Z96653 Presence of artificial knee joint, bilateral: Secondary | ICD-10-CM | POA: Insufficient documentation

## 2023-03-04 DIAGNOSIS — I10 Essential (primary) hypertension: Secondary | ICD-10-CM | POA: Diagnosis not present

## 2023-03-04 DIAGNOSIS — I480 Paroxysmal atrial fibrillation: Secondary | ICD-10-CM | POA: Insufficient documentation

## 2023-03-04 DIAGNOSIS — I491 Atrial premature depolarization: Secondary | ICD-10-CM | POA: Diagnosis not present

## 2023-03-04 DIAGNOSIS — Z7901 Long term (current) use of anticoagulants: Secondary | ICD-10-CM | POA: Insufficient documentation

## 2023-03-04 DIAGNOSIS — E785 Hyperlipidemia, unspecified: Secondary | ICD-10-CM | POA: Diagnosis not present

## 2023-03-04 DIAGNOSIS — Z79899 Other long term (current) drug therapy: Secondary | ICD-10-CM | POA: Insufficient documentation

## 2023-03-04 DIAGNOSIS — I4891 Unspecified atrial fibrillation: Secondary | ICD-10-CM

## 2023-03-04 DIAGNOSIS — I252 Old myocardial infarction: Secondary | ICD-10-CM | POA: Insufficient documentation

## 2023-03-04 DIAGNOSIS — E039 Hypothyroidism, unspecified: Secondary | ICD-10-CM | POA: Insufficient documentation

## 2023-03-04 HISTORY — PX: CARDIOVERSION: SHX1299

## 2023-03-04 SURGERY — CARDIOVERSION
Anesthesia: General

## 2023-03-04 MED ORDER — SODIUM CHLORIDE 0.9 % IV SOLN: INTRAVENOUS | Status: DC

## 2023-03-04 MED ORDER — PROPOFOL 10 MG/ML IV BOLUS
INTRAVENOUS | Status: DC | PRN
  Administered 2023-03-04: 10 mg via INTRAVENOUS
  Administered 2023-03-04: 30 mg via INTRAVENOUS

## 2023-03-04 NOTE — Anesthesia Preprocedure Evaluation (Signed)
Anesthesia Evaluation  Patient identified by MRN, date of birth, ID band Patient awake    Reviewed: Allergy & Precautions, H&P , NPO status , Patient's Chart, lab work & pertinent test results, reviewed documented beta blocker date and time   History of Anesthesia Complications (+) PONV and history of anesthetic complications  Airway Mallampati: II   Neck ROM: full    Dental  (+) Poor Dentition   Pulmonary neg pulmonary ROS   Pulmonary exam normal        Cardiovascular Exercise Tolerance: Poor hypertension, On Medications + Past MI  Normal cardiovascular exam Rhythm:regular Rate:Normal     Neuro/Psych negative neurological ROS  negative psych ROS   GI/Hepatic Neg liver ROS,GERD  Medicated,,  Endo/Other  Hypothyroidism    Renal/GU negative Renal ROS  negative genitourinary   Musculoskeletal   Abdominal   Peds  Hematology negative hematology ROS (+)   Anesthesia Other Findings Past Medical History: No date: Arthritis No date: History of kidney stones No date: Hypothyroidism No date: Myocardial infarct (HCC) No date: PONV (postoperative nausea and vomiting) Past Surgical History: 1984: BREAST EXCISIONAL BIOPSY; Bilateral     Comment:  benign No date: CATARACT EXTRACTION, BILATERAL No date: CHOLECYSTECTOMY No date: EYE SURGERY 12/2012: JOINT REPLACEMENT; Left     Comment:  knee 02/15/2017: KNEE ARTHROPLASTY; Right     Comment:  Procedure: COMPUTER ASSISTED TOTAL KNEE ARTHROPLASTY;                Surgeon: Donato Heinz, MD;  Location: [facility];                Service: Orthopedics;  Laterality: Right; 11/04/2020: LEFT HEART CATH AND CORONARY ANGIOGRAPHY; N/A     Comment:  Procedure: LEFT HEART CATH AND CORONARY ANGIOGRAPHY;                Surgeon: Marcina Millard, MD;  Location: [facility];  Service: Cardiovascular;  Laterality:               N/A; 04/20/2018: PHOTOCOAGULATION  WITH LASER; Right     Comment:  Procedure: TRANSCLERAL DIODE CYCLOPHOTOCOAGULATION                RIGHT per Hope block;  Surgeon: Lockie Mola, MD;              [facility]: [facility];  Service: Ophthalmology;                Laterality: Right; 2010: ROTATOR CUFF REPAIR; Left No date: TONSILLECTOMY BMI    Body Mass Index: 31.24 kg/m     Reproductive/Obstetrics negative OB ROS                             Anesthesia Physical Anesthesia Plan  ASA: 4  Anesthesia Plan: General   Post-op Pain Management:    Induction:   PONV Risk Score and Plan: 4 or greater  Airway Management Planned:   Additional Equipment:   Intra-op Plan:   Post-operative Plan:   Informed Consent: I have reviewed the patients History and Physical, chart, labs and discussed the procedure including the risks, benefits and alternatives for the proposed anesthesia with the patient or authorized representative who has indicated his/her understanding and acceptance.     Dental Advisory Given  Plan Discussed with: CRNA  Anesthesia Plan Comments:  Anesthesia Quick Evaluation  

## 2023-03-04 NOTE — Transfer of Care (Signed)
Immediate Anesthesia Transfer of Care Note  Patient: Maria Trujillo  Procedure(s) Performed: CARDIOVERSION  Patient Location:  Special Procedures  Anesthesia Type:General  Level of Consciousness: drowsy  Airway & Oxygen Therapy: Patient Spontanous Breathing and Patient connected to nasal cannula oxygen  Post-op Assessment: Report given to RN and Post -op Vital signs reviewed and stable  Post vital signs: Reviewed and stable  Last Vitals:  Vitals Value Taken Time  BP 82/53 03/04/23 0754  Temp    Pulse 56 03/04/23 0757  Resp 28 03/04/23 0757  SpO2 92 % 03/04/23 0757  Vitals shown include unvalidated device data.  Last Pain:  Vitals:   03/04/23 0704  TempSrc: Oral         Complications: No notable events documented.

## 2023-03-04 NOTE — Op Note (Signed)
[facility]   03/04/2023                     8:08 AM  PATIENT:  Maria Trujillo    PRE-OPERATIVE DIAGNOSIS:  Cardioversion  AFib  POST-OPERATIVE DIAGNOSIS:  Same  PROCEDURE:  CARDIOVERSION  SURGEON:  Marcina Millard, MD    ANESTHESIA:     PREOPERATIVE INDICATIONS:  Maria Trujillo is a  89 y.o. female with a diagnosis of Cardioversion  AFib who failed conservative measures and elected for surgical management.    The risks benefits and alternatives were discussed with the patient preoperatively including but not limited to the risks of infection, bleeding, cardiopulmonary complications, the need for revision surgery, among others, and the patient was willing to proceed.   OPERATIVE PROCEDURE: The patient presented to special procedures in a fasting state.  ECG revealed atrial fibrillation at 114 bpm.  The patient received 40 mg of propofol.  Electrical cardioversion was performed with 75 J to conversion to sinus rhythm.  Post cardioversion ECG revealed normal sinus rhythm at 71 bpm.  There were no periprocedural complications.

## 2023-03-05 ENCOUNTER — Encounter: Payer: Self-pay | Admitting: Cardiology

## 2023-03-08 NOTE — Anesthesia Postprocedure Evaluation (Signed)
Anesthesia Post Note  Patient: Maria Trujillo  Procedure(s) Performed: CARDIOVERSION  Patient location during evaluation: PACU Anesthesia Type: General Level of consciousness: awake and alert Pain management: pain level controlled Vital Signs Assessment: post-procedure vital signs reviewed and stable Respiratory status: spontaneous breathing, nonlabored ventilation, respiratory function stable and patient connected to nasal cannula oxygen Cardiovascular status: blood pressure returned to baseline and stable Postop Assessment: no apparent nausea or vomiting Anesthetic complications: no   No notable events documented.   Last Vitals:  Vitals:   03/04/23 0830 03/04/23 0835  BP: 111/74 113/64  Pulse: (!) 56 63  Resp: 17 20  Temp:    SpO2: 98% 98%    Last Pain:  Vitals:   03/04/23 0835  TempSrc:   PainSc: 0-No pain                 Yevette Edwards

## 2023-04-28 ENCOUNTER — Ambulatory Visit: Payer: HMO | Attending: Cardiology | Admitting: Cardiology

## 2023-04-28 ENCOUNTER — Encounter: Payer: Self-pay | Admitting: Cardiology

## 2023-04-28 VITALS — BP 106/68 | HR 107 | Ht 64.0 in | Wt 177.0 lb

## 2023-04-28 DIAGNOSIS — I5032 Chronic diastolic (congestive) heart failure: Secondary | ICD-10-CM | POA: Diagnosis not present

## 2023-04-28 DIAGNOSIS — I2510 Atherosclerotic heart disease of native coronary artery without angina pectoris: Secondary | ICD-10-CM | POA: Diagnosis not present

## 2023-04-28 DIAGNOSIS — I4819 Other persistent atrial fibrillation: Secondary | ICD-10-CM

## 2023-04-28 NOTE — Patient Instructions (Addendum)
Medication Instructions:  Your physician recommends that you continue on your current medications as directed. Please refer to the Current Medication list given to you today.  *If you need a refill on your cardiac medications before your next appointment, please call your pharmacy*  Follow-Up: At [facility], you and your health needs are our priority.  As part of our continuing mission to provide you with exceptional heart care, we have created designated Provider Care Teams.  These Care Teams include your primary Cardiologist (physician) and Advanced Practice Providers (APPs -  Physician Assistants and Nurse Practitioners) who all work together to provide you with the care you need, when you need it.  Your next appointment:   You will be contacted by Ventura Sellers, RN at the At[facility] Fibrillation Clinic to set up your admission for Tikosyn.  Phone number: [phone]    Tikosyn (Dofetilide) Hospital Admission   Prior to day of admission:  Check with drug insurance company for cost of drug to ensure affordability --- Dofetilide 500 mcg twice a day.  GoodRx is an option if insurance copay is unaffordable.    No Benadryl is allowed 3 days prior to admission.   Please ensure no missed doses of your anticoagulation (blood thinner) for 3 weeks prior to admission. If a dose is missed please notify our office immediately.   A pharmacist will review all your medications for potential interactions with Tikosyn. If any medication changes are needed prior to admission we will be in touch with you.   If any new medications are started AFTER your admission date is set with Radio producer. Please notify our office immediately so your medication list can be updated and reviewed by our pharmacist again.  On day of admission:  Tikosyn initiation requires a 3 night/4 day hospital stay with constant telemetry monitoring. You will have an EKG after each dose of Tikosyn as well as daily lab draws.   If  the drug does not convert you to normal rhythm a cardioversion after the 4th dose of Tikosyn.   Afib Clinic office visit on the morning of admission is needed for preliminary labs/ekg.   Time of admission is dependent on bed availability in the hospital. In some instances, you will be sent home until bed is available. Rarely admission can be delayed to the following day if hospital census prevents available beds.   You may bring personal belongings/clothing with you to the hospital. Please leave your suitcase in the car until you arrive in admissions.   Questions please call our office at [phone]

## 2023-04-28 NOTE — Progress Notes (Signed)
Electrophysiology Office Note:    Date:  04/28/2023   ID:  Maria Trujillo, DOB 09-15-1934, MRN [id number]  [facility] Cardiologist:  None  [facility] Electrophysiologist:  Maria Prude, MD   Referring MD: Maria Ivanoff, PA-C   Chief Complaint: Atrial fibrillation  History of Present Illness:    Maria Trujillo is a 89 y.o. femalewho I am seeing today for an evaluation of atrial fibrillation at the request of Maria Ivanoff, PA-C.  The patient was last seen by Maria Trujillo on Mar 11, 2023.  The patient has a medical history that includes coronary artery disease, NSTEMI, hypertension, hyperlipidemia, persistent atrial fibrillation with cardioversion on Mar 04, 2023.  Her atrial fibrillation was diagnosed in 2022 on a heart monitor.  She had a long history of palpitations and PACs prior to that diagnosis being made.  The patient had a cardioversion on Mar 04, 2023.  She felt well for 1 week after the cardioversion but then developed recurrent palpitations.  EKG in [name]'s office showed atrial fibrillation with a rapid ventricular rate of 107 bpm.  She also had associated lower extremity edema.  She is with her daughter today in clinic.  The patient felt better when she was back in normal rhythm for several days before returning to atrial fibrillation.  Her daughter confirms.  She tolerates Eliquis without bleeding issues.        Their past medical, social and family history was reveiwed.   ROS:   Please see the history of present illness.    All other systems reviewed and are negative.  EKGs/Labs/Other Studies Reviewed:    The following studies were reviewed today:  November 04, 2020 echo EF 55-60 RV normal Mild to moderate MR Mild to moderate TR  EKG Interpretation Date/Time:  Wednesday April 28 2023 14:28:36 EDT Ventricular Rate:  107 PR Interval:    QRS Duration:  78 QT Interval:  318 QTC Calculation: 424 R Axis:   -6  Text Interpretation: Atrial fibrillation with rapid  ventricular response Confirmed by Maria Trujillo [phone]) on 04/28/2023 3:03:38 PM    Physical Exam:    VS:  BP 106/68   Pulse (!) 107   Ht 5\' 4"  (1.626 m)   Wt 177 lb (80.3 kg)   BMI 30.38 kg/m     Wt Readings from Last 3 Encounters:  04/28/23 177 lb (80.3 kg)  03/04/23 182 lb (82.6 kg)  11/18/22 175 lb (79.4 kg)     GEN:  Well nourished, well developed in no acute distress.  Appears younger than stated age CARDIAC: Irregularly irregular, no murmurs, rubs, gallops RESPIRATORY:  Clear to auscultation without rales, wheezing or rhonchi       ASSESSMENT AND PLAN:    1. Persistent atrial fibrillation (HCC)   2. Coronary artery disease involving native coronary artery of native heart without angina pectoris   3. Chronic diastolic heart failure (HCC)     #Persistent atrial fibrillation Rhythm control indicated given the symptomatic nature of atrial fibrillation and its association with decompensated diastolic heart failure. On Eliquis for stroke prophylaxis, continue. Discussed treatment options including an antiarrhythmic drug such as amiodarone or Tikosyn.  The patient has a history of elevated liver enzymes and abnormal thyroid function which makes amiodarone a poor choice.  Her creatinine is 0.76.  Her QTc is 410 ms.  She will need to stop metoprolol in anticipation of starting Tikosyn.  I discussed the process for Tikosyn loading in detail including the risks and the  need for inpatient hospitalization and she wishes to proceed.  #Coronary artery disease No ischemic symptoms Continue aspirin and atorvastatin  #Chronic diastolic heart failure Rhythm control indicated as above.  Continue lisinopril, Imdur.  #Bradycardia Will limit our ability to use nodal blockers to treat her atrial fibrillation.  Stop metoprolol tartrate and transition to metoprolol succinate 25 mg by mouth once daily at night.  We discussed the potential need for permanent pacemaker in the  future.    Signed, Maria Trujillo. Lalla Brothers, MD, [facility], [facility] 04/28/2023 8:52 PM    Electrophysiology [facility]

## 2023-04-30 ENCOUNTER — Telehealth: Payer: Self-pay | Admitting: Pharmacist

## 2023-04-30 NOTE — Telephone Encounter (Signed)
Medication list reviewed in anticipation of upcoming Tikosyn initiation. Patient is not taking any contraindicated or QTc prolonging medications.   Patient is anticoagulated on Eliquis 5mg BID on the appropriate dose. Please ensure that patient has not missed any anticoagulation doses in the 3 weeks prior to Tikosyn initiation.   Patient will need to be counseled to avoid use of Benadryl while on Tikosyn and in the 2-3 days prior to Tikosyn initiation. 

## 2023-05-03 ENCOUNTER — Telehealth: Payer: Self-pay

## 2023-05-03 MED ORDER — METOPROLOL SUCCINATE ER 25 MG PO TB24: 25.0000 mg | ORAL_TABLET | Freq: Every day | ORAL | 3 refills | Status: DC

## 2023-05-03 NOTE — Telephone Encounter (Signed)
Spoke with the patient and advised her to stop taking Lopressor and start taking Toprol XL 25 mg every night. Patient verbalized understanding.

## 2023-05-03 NOTE — Telephone Encounter (Signed)
-----   Message from Lanier Prude, MD sent at 04/28/2023  8:52 PM EDT ----- Beather Arbour, can you have this patient stop her metoprolol to tartrate and start metoprolol succinate 25 mg by mouth once daily at night.  This will be in anticipation of starting Tikosyn. Thanks, Sheria Lang

## 2023-05-03 NOTE — Telephone Encounter (Signed)
Left message for patient to call back  

## 2023-05-21 ENCOUNTER — Encounter (HOSPITAL_COMMUNITY): Payer: Self-pay

## 2023-05-24 ENCOUNTER — Other Ambulatory Visit: Payer: Self-pay

## 2023-05-24 ENCOUNTER — Inpatient Hospital Stay (HOSPITAL_COMMUNITY)
Admission: AD | Admit: 2023-05-24 | Discharge: 2023-05-28 | DRG: 309 | Disposition: A | Discharge status: Home / Self Care | Payer: HMO | Source: Ambulatory Visit | Attending: Cardiology | Admitting: Cardiology

## 2023-05-24 ENCOUNTER — Encounter (HOSPITAL_COMMUNITY): Payer: Self-pay | Admitting: Physician Assistant

## 2023-05-24 ENCOUNTER — Ambulatory Visit (HOSPITAL_COMMUNITY)
Admission: RE | Admit: 2023-05-24 | Discharge: 2023-05-24 | Disposition: A | Discharge status: Home / Self Care | Payer: HMO | Source: Ambulatory Visit | Attending: Physician Assistant | Admitting: Physician Assistant

## 2023-05-24 ENCOUNTER — Encounter (HOSPITAL_COMMUNITY): Payer: Self-pay | Admitting: Cardiology

## 2023-05-24 VITALS — BP 118/72 | HR 101 | Ht 64.0 in | Wt 174.4 lb

## 2023-05-24 DIAGNOSIS — E039 Hypothyroidism, unspecified: Secondary | ICD-10-CM | POA: Insufficient documentation

## 2023-05-24 DIAGNOSIS — Z880 Allergy status to penicillin: Secondary | ICD-10-CM

## 2023-05-24 DIAGNOSIS — I110 Hypertensive heart disease with heart failure: Secondary | ICD-10-CM | POA: Diagnosis present

## 2023-05-24 DIAGNOSIS — I493 Ventricular premature depolarization: Secondary | ICD-10-CM | POA: Diagnosis present

## 2023-05-24 DIAGNOSIS — Z885 Allergy status to narcotic agent status: Secondary | ICD-10-CM | POA: Diagnosis not present

## 2023-05-24 DIAGNOSIS — I252 Old myocardial infarction: Secondary | ICD-10-CM | POA: Insufficient documentation

## 2023-05-24 DIAGNOSIS — R001 Bradycardia, unspecified: Secondary | ICD-10-CM | POA: Diagnosis present

## 2023-05-24 DIAGNOSIS — I2510 Atherosclerotic heart disease of native coronary artery without angina pectoris: Secondary | ICD-10-CM | POA: Insufficient documentation

## 2023-05-24 DIAGNOSIS — Z7901 Long term (current) use of anticoagulants: Secondary | ICD-10-CM

## 2023-05-24 DIAGNOSIS — I5032 Chronic diastolic (congestive) heart failure: Secondary | ICD-10-CM | POA: Insufficient documentation

## 2023-05-24 DIAGNOSIS — Z888 Allergy status to other drugs, medicaments and biological substances status: Secondary | ICD-10-CM

## 2023-05-24 DIAGNOSIS — D6869 Other thrombophilia: Secondary | ICD-10-CM | POA: Insufficient documentation

## 2023-05-24 DIAGNOSIS — N179 Acute kidney failure, unspecified: Secondary | ICD-10-CM | POA: Diagnosis not present

## 2023-05-24 DIAGNOSIS — Z79899 Other long term (current) drug therapy: Secondary | ICD-10-CM | POA: Insufficient documentation

## 2023-05-24 DIAGNOSIS — E785 Hyperlipidemia, unspecified: Secondary | ICD-10-CM | POA: Insufficient documentation

## 2023-05-24 DIAGNOSIS — I4819 Other persistent atrial fibrillation: Secondary | ICD-10-CM | POA: Insufficient documentation

## 2023-05-24 DIAGNOSIS — Z91013 Allergy to seafood: Secondary | ICD-10-CM

## 2023-05-24 LAB — BASIC METABOLIC PANEL WITH GFR
Anion gap: 8 (ref 5–15)
BUN: 14 mg/dL (ref 8–23)
CO2: 26 mmol/L (ref 22–32)
Calcium: 9.5 mg/dL (ref 8.9–10.3)
Chloride: 105 mmol/L (ref 98–111)
Creatinine, Ser: 0.65 mg/dL (ref 0.44–1.00)
GFR, Estimated: >60 mL/min
Glucose, Bld: 95 mg/dL (ref 70–99)
Potassium: 4.8 mmol/L (ref 3.5–5.1)
Sodium: 139 mmol/L (ref 135–145)

## 2023-05-24 LAB — MAGNESIUM: Magnesium: 2.1 mg/dL (ref 1.7–2.4)

## 2023-05-24 MED ORDER — FUROSEMIDE 20 MG PO TABS
20.0000 mg | ORAL_TABLET | Freq: Every day | ORAL | Status: DC
  Administered 2023-05-25 – 2023-05-28 (×4): 20 mg via ORAL
  Filled 2023-05-24 (×4): qty 1

## 2023-05-24 MED ORDER — LEVOTHYROXINE SODIUM 100 MCG PO TABS
100.0000 ug | ORAL_TABLET | Freq: Every day | ORAL | Status: DC
  Administered 2023-05-25 – 2023-05-28 (×4): 100 ug via ORAL
  Filled 2023-05-24 (×4): qty 1

## 2023-05-24 MED ORDER — LATANOPROST 0.005 % OP SOLN
1.0000 [drp] | Freq: Every day | OPHTHALMIC | Status: DC
  Administered 2023-05-24 – 2023-05-27 (×4): 1 [drp] via OPHTHALMIC
  Filled 2023-05-24: qty 2.5

## 2023-05-24 MED ORDER — ISOSORBIDE MONONITRATE ER 30 MG PO TB24
30.0000 mg | ORAL_TABLET | Freq: Every day | ORAL | Status: DC
  Administered 2023-05-25 – 2023-05-28 (×4): 30 mg via ORAL
  Filled 2023-05-24 (×4): qty 1

## 2023-05-24 MED ORDER — LISINOPRIL 5 MG PO TABS
5.0000 mg | ORAL_TABLET | Freq: Every day | ORAL | Status: DC
  Administered 2023-05-25 – 2023-05-28 (×4): 5 mg via ORAL
  Filled 2023-05-24 (×4): qty 1

## 2023-05-24 MED ORDER — AZELASTINE HCL 0.1 % NA SOLN
1.0000 | Freq: Two times a day (BID) | NASAL | Status: DC
  Administered 2023-05-26 – 2023-05-28 (×3): 1 via NASAL
  Filled 2023-05-24: qty 30

## 2023-05-24 MED ORDER — APIXABAN 5 MG PO TABS
5.0000 mg | ORAL_TABLET | Freq: Two times a day (BID) | ORAL | Status: DC
  Administered 2023-05-24 – 2023-05-28 (×8): 5 mg via ORAL
  Filled 2023-05-24 (×8): qty 1

## 2023-05-24 MED ORDER — OMEGA-3-ACID ETHYL ESTERS 1 G PO CAPS
1000.0000 mg | ORAL_CAPSULE | Freq: Every day | ORAL | Status: DC
  Administered 2023-05-26 – 2023-05-28 (×3): 1000 mg via ORAL
  Filled 2023-05-24 (×5): qty 1

## 2023-05-24 MED ORDER — METOPROLOL SUCCINATE ER 25 MG PO TB24
25.0000 mg | ORAL_TABLET | Freq: Every day | ORAL | Status: DC
  Administered 2023-05-25 – 2023-05-26 (×2): 25 mg via ORAL
  Filled 2023-05-24 (×3): qty 1

## 2023-05-24 MED ORDER — TIMOLOL MALEATE 0.5 % OP SOLN
1.0000 [drp] | Freq: Every day | OPHTHALMIC | Status: DC
  Administered 2023-05-25 – 2023-05-28 (×4): 1 [drp] via OPHTHALMIC
  Filled 2023-05-24: qty 5

## 2023-05-24 MED ORDER — SODIUM CHLORIDE 0.9% FLUSH
3.0000 mL | Freq: Two times a day (BID) | INTRAVENOUS | Status: DC
  Administered 2023-05-24 – 2023-05-28 (×8): 3 mL via INTRAVENOUS
  Filled 2023-05-24: qty 3

## 2023-05-24 MED ORDER — ASPIRIN 81 MG PO CHEW
81.0000 mg | CHEWABLE_TABLET | Freq: Every day | ORAL | Status: DC
  Administered 2023-05-24: 81 mg via ORAL
  Filled 2023-05-24 (×3): qty 1

## 2023-05-24 MED ORDER — VITAMIN D 25 MCG (1000 UNIT) PO TABS
1000.0000 [IU] | ORAL_TABLET | Freq: Every day | ORAL | Status: DC
  Administered 2023-05-25 – 2023-05-28 (×4): 1000 [IU] via ORAL
  Filled 2023-05-24 (×4): qty 1

## 2023-05-24 MED ORDER — SODIUM CHLORIDE 0.9% FLUSH: 3.0000 mL | INTRAVENOUS | Status: DC | PRN

## 2023-05-24 MED ORDER — ADULT MULTIVITAMIN W/MINERALS CH
1.0000 | ORAL_TABLET | Freq: Every day | ORAL | Status: DC
  Administered 2023-05-25 – 2023-05-28 (×4): 1 via ORAL
  Filled 2023-05-24 (×4): qty 1

## 2023-05-24 MED ORDER — ACETAMINOPHEN 325 MG PO TABS: 650.0000 mg | ORAL_TABLET | Freq: Every day | ORAL | Status: DC | PRN

## 2023-05-24 MED ORDER — SODIUM CHLORIDE 0.9 % IV SOLN
250.0000 mL | INTRAVENOUS | Status: DC | PRN
  Administered 2023-05-27: 250 mL via INTRAVENOUS

## 2023-05-24 MED ORDER — DOFETILIDE 500 MCG PO CAPS
500.0000 ug | ORAL_CAPSULE | Freq: Two times a day (BID) | ORAL | Status: DC
  Administered 2023-05-24 – 2023-05-25 (×2): 500 ug via ORAL
  Filled 2023-05-24 (×2): qty 1

## 2023-05-24 NOTE — Progress Notes (Signed)
Primary Care Physician: Marina Goodell, MD Primary Cardiologist: None Electrophysiologist: Lanier Prude, MD  Referring Physician: Dr Sandra Cockayne is a 89 y.o. female with a history of CAD, HTN, HLD, hypothyroidism, chronic HFpEF, atrial fibrillation who presents for follow up in the [facility].  Her atrial fibrillation was diagnosed in 2022 on a heart monitor. She had a long history of palpitations and PACs prior to that diagnosis being made. The patient had a cardioversion on Mar 04, 2023. She felt well for 1 week after the cardioversion but then developed recurrent palpitations. Patient is on Eliquis for a CHADS2VASC score of 6.  On follow up today, patient remains in afib today. She denies any missed doses of anticoagulation in the past 3 weeks.   Today, she denies symptoms of palpitations, chest pain, shortness of breath, orthopnea, PND, lower extremity edema, dizziness, presyncope, syncope, snoring, daytime somnolence, bleeding, or neurologic sequela. The patient is tolerating medications without difficulties and is otherwise without complaint today.    Atrial Fibrillation Risk Factors:  she does not have symptoms or diagnosis of sleep apnea. she does not have a history of rheumatic fever.   Atrial Fibrillation Management history:  Previous antiarrhythmic drugs: none Previous cardioversions: 03/04/23 Previous ablations: none Anticoagulation history: Eliquis  ROS- All systems are reviewed and negative except as per the HPI above.   Physical Exam: BP 118/72   Pulse (!) 101   Ht 5\' 4"  (1.626 m)   Wt 79.1 kg   BMI 29.94 kg/m   GEN: Well nourished, well developed in no acute distress NECK: No JVD; No carotid bruits CARDIAC: Irregularly irregular rate and rhythm, no murmurs, rubs, gallops RESPIRATORY:  Clear to auscultation without rales, wheezing or rhonchi  ABDOMEN: Soft, non-tender, non-distended EXTREMITIES:  No edema; No  deformity   Wt Readings from Last 3 Encounters:  05/24/23 79.1 kg  04/28/23 80.3 kg  03/04/23 82.6 kg     EKG today demonstrates  Afib Vent. rate 101 BPM PR interval * ms QRS duration 86 ms QT/QTcB 306/396 ms  Echo 11/03/20 demonstrated   1. Left ventricular ejection fraction, by estimation, is 55 to 60%. The  left ventricle has normal function. The left ventricle has no regional  wall motion abnormalities. Left ventricular diastolic parameters were  normal.   2. Right ventricular systolic function is normal. The right ventricular  size is normal.   3. The mitral valve is normal in structure. Mild to moderate mitral valve  regurgitation. No evidence of mitral stenosis.   4. Tricuspid valve regurgitation is mild to moderate.   5. The aortic valve is normal in structure. Aortic valve regurgitation is  not visualized. No aortic stenosis is present.   6. The inferior vena cava is normal in size with greater than 50%  respiratory variability, suggesting right atrial pressure of 3 mmHg.    CHA2DS2-VASc Score = 6  The patient's score is based upon: CHF History: 1 HTN History: 1 Diabetes History: 0 Stroke History: 0 Vascular Disease History: 1 Age Score: 2 Gender Score: 1       ASSESSMENT AND PLAN: Persistent Atrial Fibrillation (ICD10:  I48.19) The patient's CHA2DS2-VASc score is 6, indicating a 9.7% annual risk of stroke.   Patient presents for dofetilide admission. Continue Eliquis 5 mg BID, states no missed doses in the last 3 weeks. No recent benadryl use PharmD has screened medications QTc in SR 410 ms Labs today show creatinine at  0.65, K+ 4.8 and mag 2.1, CrCl calculated at 76 mL/min Continue Toprol 25 mg daily  Secondary Hypercoagulable State (ICD10:  D68.69) The patient is at significant risk for stroke/thromboembolism based upon her CHA2DS2-VASc Score of 6.  Continue Apixaban (Eliquis).   HTN Stable on current regimen  CAD S/p NSTEMI No anginal  symptoms  Chronic HFpEF Fluid status appears stable today    To be admitted later today once a bed becomes available.    Jorja Loa PA-C Afib Clinic [facility] [address] [place], [place] [address] [phone]

## 2023-05-24 NOTE — Progress Notes (Signed)
Pharmacy: Dofetilide (Tikosyn) - Initial Consult Assessment and Electrolyte Replacement  Pharmacy consulted to assist in monitoring and replacing electrolytes in this 89 y.o. female admitted on 05/24/2023 undergoing dofetilide initiation.  Assessment:  Patient Exclusion Criteria: If any screening criteria checked as "Yes", then  patient  should NOT receive dofetilide until criteria item is corrected.  If "Yes" please indicate correction plan.  YES  NO Patient  Exclusion Criteria Correction Plan   []   [x]   Baseline QTc interval is greater than or equal to 440 msec. IF above YES box checked dofetilide contraindicated unless patient has ICD; then may proceed if QTc 500-550 msec or with known ventricular conduction abnormalities may proceed with QTc 550-600 msec. QTc =  410 per clinic note    []   [x]   Patient is known or suspected to have a digoxin level greater than 2 ng/ml: No results found for: "DIGOXIN"     []   [x]   Creatinine clearance less than 20 ml/min (calculated using Cockcroft-Gault, actual body weight and serum creatinine): Estimated Creatinine Clearance: 50.5 mL/min (by C-G formula based on SCr of 0.65 mg/dL).     []   [x]  Patient has received drugs known to prolong the QT intervals within the last 48 hours (phenothiazines, tricyclics or tetracyclic antidepressants, erythromycin, H-1 antihistamines, cisapride, fluoroquinolones, azithromycin, ondansetron).   Updated information on QT prolonging agents is available to be searched on the following database:QT prolonging agents     []   [x]   Patient received a dose of hydrochlorothiazide (Oretic) alone or in any combination including triamterene (Dyazide, Maxzide) in the last 48 hours.    []   [x]  Patient received a medication known to increase dofetilide plasma concentrations prior to initial dofetilide dose:  Trimethoprim (Primsol, Proloprim) in the last 36 hours Verapamil (Calan, Verelan) in the last 36 hours or a  sustained release dose in the last 72 hours Megestrol (Megace) in the last 5 days  Cimetidine (Tagamet) in the last 6 hours Ketoconazole (Nizoral) in the last 24 hours Itraconazole (Sporanox) in the last 48 hours  Prochlorperazine (Compazine) in the last 36 hours     []   [x]   Patient is known to have a history of torsades de pointes; congenital or acquired long QT syndromes.    []   [x]   Patient has received a Class 1 antiarrhythmic with less than 2 half-lives since last dose. (Disopyramide, Quinidine, Procainamide, Lidocaine, Mexiletine, Flecainide, Propafenone)    []   [x]   Patient has received amiodarone therapy in the past 3 months or amiodarone level is greater than 0.3 ng/ml.    Labs:    Component Value Date/Time   K 4.8 05/24/2023 1124   K 3.9 12/31/2012 0437   MG 2.1 05/24/2023 1124     Plan: Select One Calculated CrCl  Dose q12h  [x]  > 60 ml/min 500 mcg  []  40-60 ml/min 250 mcg  []  20-40 ml/min 125 mcg   [x]   Physician selected initial dose within range recommended for patients level of renal function - will monitor for response.  []   Physician selected initial dose outside of range recommended for patients level of renal function - will discuss if the dose should be altered at this time.   Patient has been appropriately anticoagulated with apixaban.  Potassium: K >/= 4: Appropriate to initiate Tikosyn, no replacement needed    Magnesium: Mg >2: Appropriate to initiate Tikosyn, no replacement needed     Thank you for allowing pharmacy to participate in this patient's care   Greig Castilla  Daphane Shepherd, PharmD Clinical Pharmacist **Pharmacist phone directory can now be found on [email or web address] ([email or web address]).  Listed under [facility].

## 2023-05-24 NOTE — H&P (Signed)
Electrophysiology H&P  Note    Primary Care Physician: Marina Goodell, MD Primary Cardiologist: None Electrophysiologist: Lanier Prude, MD  Referring Physician: Dr Sandra Cockayne is a 89 y.o. female with a history of CAD, HTN, HLD, hypothyroidism, chronic HFpEF, atrial fibrillation who presents for follow up in the [facility].  Her atrial fibrillation was diagnosed in 2022 on a heart monitor. She had a long history of palpitations and PACs prior to that diagnosis being made. The patient had a cardioversion on Mar 04, 2023. She felt well for 1 week after the cardioversion but then developed recurrent palpitations. Patient is on Eliquis for a CHADS2VASC score of 6.  On follow up today, patient remains in afib today. She denies any missed doses of anticoagulation in the past 3 weeks.   Today, she denies symptoms of palpitations, chest pain, shortness of breath, orthopnea, PND, lower extremity edema, dizziness, presyncope, syncope, snoring, daytime somnolence, bleeding, or neurologic sequela. The patient is tolerating medications without difficulties and is otherwise without complaint today.   Atrial Fibrillation Risk Factors:  she does not have symptoms or diagnosis of sleep apnea. she does not have a history of rheumatic fever.   Atrial Fibrillation Management history:  Previous antiarrhythmic drugs: none Previous cardioversions: 03/04/23 Previous ablations: none Anticoagulation history: Eliquis  Review of systems complete and found to be negative unless listed in HPI.    Physical Exam: BP 118/72  Pulse (!) 101  Ht 5\' 4"  (1.626 m)  Wt 79.1 kg  BMI 29.94 kg/m   GEN: Well nourished, well developed in no acute distress NECK: No JVD; No carotid bruits CARDIAC: Irregularly irregular rate and rhythm, no murmurs, rubs, gallops RESPIRATORY:  Clear to auscultation without rales, wheezing or rhonchi  ABDOMEN: Soft, non-tender,  non-distended EXTREMITIES:  No edema; No deformity   Wt Readings from Last 3 Encounters:  05/24/23 79.1 kg  04/28/23 80.3 kg  03/04/23 82.6 kg    EKG today demonstrates  Afib Vent. rate 101 BPM PR interval * ms QRS duration 86 ms QT/QTcB 306/396 ms  Echo 11/03/20 demonstrated   1. Left ventricular ejection fraction, by estimation, is 55 to 60%. The  left ventricle has normal function. The left ventricle has no regional  wall motion abnormalities. Left ventricular diastolic parameters were  normal.   2. Right ventricular systolic function is normal. The right ventricular  size is normal.   3. The mitral valve is normal in structure. Mild to moderate mitral valve  regurgitation. No evidence of mitral stenosis.   4. Tricuspid valve regurgitation is mild to moderate.   5. The aortic valve is normal in structure. Aortic valve regurgitation is  not visualized. No aortic stenosis is present.   6. The inferior vena cava is normal in size with greater than 50%  respiratory variability, suggesting right atrial pressure of 3 mmHg.   CHA2DS2-VASc Score = 6  The patient's score is based upon: CHF History: 1 HTN History: 1 Diabetes History: 0 Stroke History: 0 Vascular Disease History: 1 Age Score: 2 Gender Score: 1     ASSESSMENT AND PLAN: Persistent Atrial Fibrillation (ICD10:  I48.19) The patient's CHA2DS2-VASc score is 6, indicating a 9.7% annual risk of stroke.   Patient presents for dofetilide admission in effort to avoid rate slowing medications.  Continue Eliquis 5 mg BID, states no missed doses in the last 3 weeks. No recent benadryl use PharmD has screened medications QTc in SR 410 ms  Labs today show creatinine at 0.65, K+ 4.8 and mag 2.1, CrCl calculated at 76 mL/min Continue Toprol 25 mg daily  Secondary Hypercoagulable State (ICD10:  D68.69) The patient is at significant risk for stroke/thromboembolism based upon her CHA2DS2-VASc Score of 6.  Continue Apixaban  (Eliquis).   HTN Stable on current regimen  CAD S/p NSTEMI No anginal symptoms  Chronic HFpEF Fluid status appears stable today  Pt presents today for Tikosyn admission as above.   Casimiro Needle [address]" [place], [place]  05/24/2023 12:23 PM

## 2023-05-24 NOTE — TOC CM/SW Note (Signed)
Transition of Care [facility]) - Inpatient Brief Assessment   Patient Details  Name: Maria Trujillo MRN: [id number] Date of Birth: 09/04/1934  Transition of Care [facility]) CM/SW Contact:    Gala Lewandowsky, RN Phone Number: 05/24/2023, 3:17 PM   Clinical Narrative: Transition of Care Department [facility]) has reviewed the patient. Patient presented for Tikosyn Load. Benefits check submitted for cost. Case Manager will discuss cost and pharmacy of choice as the patient progresses.   Transition of Care Asessment: Insurance and Status: Insurance coverage has been reviewed Patient has primary care physician: Yes Prior/Current Home Services: No current home services Social Determinants of Health Reivew: SDOH reviewed no interventions necessary Readmission risk has been reviewed: Yes Transition of care needs: transition of care needs identified, TOC will continue to follow

## 2023-05-25 ENCOUNTER — Other Ambulatory Visit: Payer: Self-pay

## 2023-05-25 DIAGNOSIS — I4819 Other persistent atrial fibrillation: Secondary | ICD-10-CM | POA: Diagnosis not present

## 2023-05-25 MED ORDER — POTASSIUM CHLORIDE CRYS ER 20 MEQ PO TBCR
20.0000 meq | EXTENDED_RELEASE_TABLET | Freq: Once | ORAL | Status: AC
  Administered 2023-05-25: 20 meq via ORAL
  Filled 2023-05-25: qty 1

## 2023-05-25 MED ORDER — PRESERVISION AREDS 2 PO CAPS
1.0000 | ORAL_CAPSULE | Freq: Two times a day (BID) | ORAL | Status: DC
  Administered 2023-05-25 – 2023-05-28 (×6): 1 via ORAL
  Filled 2023-05-25 (×6): qty 1

## 2023-05-25 MED ORDER — DOFETILIDE 250 MCG PO CAPS
250.0000 ug | ORAL_CAPSULE | Freq: Two times a day (BID) | ORAL | Status: DC
  Administered 2023-05-25: 250 ug via ORAL
  Filled 2023-05-25: qty 1

## 2023-05-25 MED ORDER — ATORVASTATIN CALCIUM 40 MG PO TABS
40.0000 mg | ORAL_TABLET | Freq: Every day | ORAL | Status: DC
  Administered 2023-05-25 – 2023-05-28 (×4): 40 mg via ORAL
  Filled 2023-05-25 (×4): qty 1

## 2023-05-25 MED ORDER — MAGNESIUM SULFATE 2 GM/50ML IV SOLN
2.0000 g | Freq: Once | INTRAVENOUS | Status: AC
  Administered 2023-05-25: 2 g via INTRAVENOUS
  Filled 2023-05-25: qty 50

## 2023-05-25 MED ORDER — ASPIRIN 81 MG PO CHEW
81.0000 mg | CHEWABLE_TABLET | Freq: Every day | ORAL | Status: DC
  Administered 2023-05-25 – 2023-05-27 (×3): 81 mg via ORAL
  Filled 2023-05-25 (×3): qty 1

## 2023-05-25 NOTE — Progress Notes (Signed)
Electrophysiology Rounding Note  Patient Name: Maria Trujillo Date of Encounter: 05/25/2023  Primary Cardiologist: None  Electrophysiologist: Lanier Prude, MD    Subjective   Pt converted to sinus rhythm on Tikosyn 500 mcg BID   QTc from EKG last pm shows stable QTc at ~410-420 (AF).   Pt converted to NSR just after 0300.  The patient is doing well today.  At this time, the patient denies chest pain, shortness of breath, or any new concerns.  Inpatient Medications    Scheduled Meds:  apixaban  5 mg Oral BID   aspirin  81 mg Oral QHS   azelastine  1 spray Each Nare BID   cholecalciferol  1,000 Units Oral Daily   dofetilide  500 mcg Oral BID   furosemide  20 mg Oral Daily   isosorbide mononitrate  30 mg Oral Daily   latanoprost  1 drop Both Eyes QHS   levothyroxine  100 mcg Oral QAC breakfast   lisinopril  5 mg Oral Daily   metoprolol succinate  25 mg Oral Daily   multivitamin with minerals  1 tablet Oral Daily   omega-3 acid ethyl esters  1,000 mg Oral Daily   sodium chloride flush  3 mL Intravenous Q12H   timolol  1 drop Both Eyes Daily   Continuous Infusions:  sodium chloride     PRN Meds: sodium chloride, acetaminophen, sodium chloride flush   Vital Signs    Vitals:   05/24/23 1424 05/24/23 1440 05/24/23 2011 05/25/23 0454  BP:  101/86 118/62 124/69  Pulse:  (!) 101  65  Resp:  18 17 18   Temp:  (!) 97.5 F (36.4 C) 97.7 F (36.5 C) 97.7 F (36.5 C)  TempSrc:  Axillary Oral Oral  Weight: 79.4 kg 79.4 kg    Height: 5\' 4"  (1.626 m) 5' 4.02" (1.626 m)      Intake/Output Summary (Last 24 hours) at 05/25/2023 0709 Last data filed at 05/25/2023 0600 Gross per 24 hour  Intake 300 ml  Output --  Net 300 ml   Filed Weights   05/24/23 1424 05/24/23 1440  Weight: 79.4 kg 79.4 kg    Physical Exam    GEN- NAD, A&O x 3. Normal affect.  Lungs- CTAB, Normal effort.  Heart- Regular rate and rhythm. No M/G/R GI- Soft, NT, ND Extremities- No clubbing,  cyanosis, or edema Skin- no rash or lesion  Labs    CBC No results for input(s): "WBC", "NEUTROABS", "HGB", "HCT", "MCV", "PLT" in the last 72 hours. Basic Metabolic Panel Recent Labs    91/47/82 1124 05/25/23 0134  NA 139 139  K 4.8 3.9  CL 105 108  CO2 26 20*  GLUCOSE 95 95  BUN 14 20  CREATININE 0.65 0.97  CALCIUM 9.5 9.1  MG 2.1 1.8    Telemetry    AF 90-120s -> Sinus brady/NSR 50-70s, converted just after 0300 (personally reviewed)  Patient Profile     Maria Trujillo is a 89 y.o. female with a past medical history significant for persistent atrial fibrillation.  They were admitted for tikosyn load.   Assessment & Plan    Persistent atrial fibrillation Pt converted to sinus rhythm on Tikosyn 500 mcg BID  Continue Eliquis Creatinine, ser  0.97 (07/30 0134) Magnesium  1.8 (07/30 0134) Potassium3.9 (07/30 0134) Supplement both K and Mg per protocol  Plan for home Thursday if QTc remains stable.  For questions or updates, please contact [facility] Please consult  [email or web address] for contact info under Cardiology/STEMI.  Signed, Graciella Freer, PA-C  05/25/2023, 7:09 AM

## 2023-05-25 NOTE — Progress Notes (Addendum)
Morning EKG reviewed     Shows has converted to NSR with prolonged QTc at 490-510 ms.  Pt had first dose conversion and also mild AKI pushing CrCl to ~ 50. Decrease Tikosyn 250 mcg BID.   Potassium3.9 (07/30 0134) Magnesium  1.8 (07/30 0134) Creatinine, ser  0.97 (07/30 0134)  Plan for home Thursday if QTc remains stable   Graciella Freer, [place]  05/25/2023 11:04 AM

## 2023-05-25 NOTE — Progress Notes (Signed)
This RN was notified by Consulting civil engineer of patient's HR jumping to 150s, non-sustained. Upon assessment, patient was walking around in room, after using the bathroom, and adjusting her clothing. Patient denies any symptoms including palpitations, shortness of breath, and dizziness. Patient returned to bed and HR in low 100s-130s. Charge RN recommended to call cardiology on call provider to notify of patient's HR.  Cardiology on call provider, Dr. Mackie Pai, called back with no new orders. Will continue to monitor.

## 2023-05-25 NOTE — Progress Notes (Signed)
Pharmacy: Dofetilide (Tikosyn) - Follow Up Assessment and Electrolyte Replacement  Pharmacy consulted to assist in monitoring and replacing electrolytes in this 89 y.o. female admitted on 05/24/2023 undergoing dofetilide initiation. First dofetilide dose: 05/24/23  Labs:    Component Value Date/Time   K 3.9 05/25/2023 0134   MG 1.8 05/25/2023 0134     Plan: Potassium: Kcl 20 mEq x1 given this morning  Magnesium: Mg 2g x1 given this morning   Thank you for allowing pharmacy to participate in this patient's care   Romie Minus, PharmD PGY1 Pharmacy Resident  Please check AMION for all [facility] Pharmacy phone numbers After 10:00 PM, call Main Pharmacy [phone]

## 2023-05-26 ENCOUNTER — Other Ambulatory Visit: Payer: Self-pay

## 2023-05-26 ENCOUNTER — Other Ambulatory Visit (HOSPITAL_COMMUNITY): Payer: Self-pay

## 2023-05-26 DIAGNOSIS — I4819 Other persistent atrial fibrillation: Secondary | ICD-10-CM | POA: Diagnosis not present

## 2023-05-26 MED ORDER — DOFETILIDE 125 MCG PO CAPS
125.0000 ug | ORAL_CAPSULE | Freq: Two times a day (BID) | ORAL | Status: DC
  Administered 2023-05-26 – 2023-05-28 (×4): 125 ug via ORAL
  Filled 2023-05-26 (×4): qty 1

## 2023-05-26 NOTE — Care Management (Signed)
05-26-23 1516 Patient presented for Tikosyn Load. Case Manager spoke with the patient regarding co pay cost. Patient is agreeable to cost and would like to have the initial Rx filled via [facility] and the Rx refills 90 day supply escribed to [facility] [place]. No further needs identified at this time.

## 2023-05-26 NOTE — Progress Notes (Signed)
Pharmacy: Dofetilide (Tikosyn) - Follow Up Assessment and Electrolyte Replacement  Pharmacy consulted to assist in monitoring and replacing electrolytes in this 89 y.o. female admitted on 05/24/2023 undergoing dofetilide initiation. First dofetilide dose: 05/24/23  Labs:    Component Value Date/Time   K 4.2 05/25/2023 2350   K 3.9 12/31/2012 0437   MG 2.1 05/25/2023 2350     Plan: Potassium: K >/= 4: No additional supplementation needed  Magnesium: Mg > 2: No additional supplementation needed  Thank you for allowing pharmacy to participate in this patient's care   Romie Minus, PharmD PGY1 Pharmacy Resident  Please check AMION for all [facility] Pharm[facility] phone numbers After 10:00 PM, call Main Pharmacy [phone]

## 2023-05-26 NOTE — TOC Benefit Eligibility Note (Signed)
Pharmacy Patient Advocate Encounter  Insurance verification completed.    The patient is insured through [employer]/ [employer]   Ran test claim for dofetilide (Tikosyn) 500 mcg and the current 30 day co-pay is $12.71.   This test claim was processed through [facility]- copay amounts may vary at other pharmacies due to pharmacy/plan contracts, or as the patient moves through the different stages of their insurance plan.    Roland Earl, CPHT Pharmacy Patient Advocate Specialist [facility] Patient Advocate Team Direct Number: [phone]  Fax: [phone]

## 2023-05-26 NOTE — Progress Notes (Signed)
Electrophysiology Rounding Note  Patient Name: Maria Trujillo Date of Encounter: 05/26/2023  Primary Cardiologist: None  Electrophysiologist: Lanier Prude, MD    Subjective   Pt remains in NSR on Tikosyn 250 mcg BID   QTc from EKG last pm shows prolonged QTc at ~510  The patient is doing well today.  At this time, the patient denies chest pain, shortness of breath, or any new concerns.  Inpatient Medications    Scheduled Meds:  apixaban  5 mg Oral BID   aspirin  81 mg Oral QHS   atorvastatin  40 mg Oral Daily   azelastine  1 spray Each Nare BID   cholecalciferol  1,000 Units Oral Daily   dofetilide  125 mcg Oral BID   furosemide  20 mg Oral Daily   isosorbide mononitrate  30 mg Oral Daily   latanoprost  1 drop Both Eyes QHS   levothyroxine  100 mcg Oral QAC breakfast   lisinopril  5 mg Oral Daily   metoprolol succinate  25 mg Oral Daily   multivitamin with minerals  1 tablet Oral Daily   omega-3 acid ethyl esters  1,000 mg Oral Daily   PreserVision AREDS 2  1 capsule Oral BID   sodium chloride flush  3 mL Intravenous Q12H   timolol  1 drop Both Eyes Daily   Continuous Infusions:  sodium chloride     PRN Meds: sodium chloride, acetaminophen, sodium chloride flush   Vital Signs    Vitals:   05/24/23 2011 05/25/23 0454 05/25/23 0834 05/25/23 2245  BP: 118/62 124/69 131/69 105/63  Pulse:  65 71 60  Resp: 17 18 18 16   Temp: 97.7 F (36.5 C) 97.7 F (36.5 C) 97.7 F (36.5 C) 97.7 F (36.5 C)  TempSrc: Oral Oral Oral Oral  Weight:      Height:        Intake/Output Summary (Last 24 hours) at 05/26/2023 0650 Last data filed at 05/25/2023 1229 Gross per 24 hour  Intake 50 ml  Output --  Net 50 ml   Filed Weights   05/24/23 1424 05/24/23 1440  Weight: 79.4 kg 79.4 kg    Physical Exam    GEN- NAD, A&O x 3. Normal affect.  Lungs- CTAB, Normal effort.  Heart- Regular rate and rhythm. No M/G/R GI- Soft, NT, ND Extremities- No clubbing, cyanosis, or  edema Skin- no rash or lesion  Labs    CBC No results for input(s): "WBC", "NEUTROABS", "HGB", "HCT", "MCV", "PLT" in the last 72 hours. Basic Metabolic Panel Recent Labs    87/56/43 0134 05/25/23 2350  NA 139 137  K 3.9 4.2  CL 108 107  CO2 20* 23  GLUCOSE 95 98  BUN 20 21  CREATININE 0.97 0.70  CALCIUM 9.1 9.1  MG 1.8 2.1    Telemetry    NSR 60-70s (personally reviewed)  Patient Profile     Maria Trujillo is a 89 y.o. female with a past medical history significant for persistent atrial fibrillation.  They were admitted for tikosyn load.   Assessment & Plan    Persistent atrial fibrillation Pt remains in NSR on Tikosyn 250 mcg BID  Her QT remains prolonged.   HOLD am dose of tikosyn and decrease to 125 mcg for this evenings dose.  Continue Eliquis Creatinine, ser  0.70 (07/30 2350) Magnesium  2.1 (07/30 2350) Potassium4.2 (07/30 2350) No electrolyte supplementation needed  Plan for home Friday if QTc remains stable. If QT  does not improve will need to discuss other options.    For questions or updates, please contact [facility] Please consult [email or web address] for contact info under Cardiology/STEMI.  Signed, Graciella Freer, PA-C  05/26/2023, 6:50 AM

## 2023-05-27 ENCOUNTER — Other Ambulatory Visit: Payer: Self-pay

## 2023-05-27 DIAGNOSIS — I4819 Other persistent atrial fibrillation: Secondary | ICD-10-CM | POA: Diagnosis not present

## 2023-05-27 MED ORDER — METOPROLOL SUCCINATE ER 25 MG PO TB24: 12.5000 mg | ORAL_TABLET | Freq: Every day | ORAL | Status: DC

## 2023-05-27 MED ORDER — POTASSIUM CHLORIDE CRYS ER 20 MEQ PO TBCR
40.0000 meq | EXTENDED_RELEASE_TABLET | Freq: Once | ORAL | Status: AC
  Administered 2023-05-27: 40 meq via ORAL
  Filled 2023-05-27: qty 2

## 2023-05-27 MED ORDER — MAGNESIUM SULFATE 2 GM/50ML IV SOLN
2.0000 g | Freq: Once | INTRAVENOUS | Status: AC
  Administered 2023-05-27: 2 g via INTRAVENOUS
  Filled 2023-05-27: qty 50

## 2023-05-27 NOTE — Progress Notes (Signed)
Patient's HR 46-51.  Spoke to [employer] PA, he stated to hold toprol xl.

## 2023-05-27 NOTE — Progress Notes (Addendum)
Morning EKG reviewed     Shows remains in NSR with borderline QTc at ~470-480 ms when corrected further for bradycardia.   Continue  Tikosyn 125 mcg BID for now.   Will hold Toprol.   Potassium3.7 (08/01 0059) Magnesium  1.8 (08/01 0059) Creatinine, ser  0.73 (08/01 0059)  Plan for home Friday if QTc remains stable   Graciella Freer, [place]  05/27/2023 11:07 AM

## 2023-05-27 NOTE — Progress Notes (Signed)
Electrophysiology Rounding Note  Patient Name: Maria Trujillo Date of Encounter: 05/27/2023  Primary Cardiologist: None  Electrophysiologist: Lanier Prude, MD    Subjective   Pt remains in NSR on Tikosyn 125 mcg BID   QTc from EKG last pm shows improved QTc at ~420-430  The patient is doing well today.  At this time, the patient denies chest pain, shortness of breath, or any new concerns.  Inpatient Medications    Scheduled Meds:  apixaban  5 mg Oral BID   aspirin  81 mg Oral QHS   atorvastatin  40 mg Oral Daily   azelastine  1 spray Each Nare BID   cholecalciferol  1,000 Units Oral Daily   dofetilide  125 mcg Oral BID   furosemide  20 mg Oral Daily   isosorbide mononitrate  30 mg Oral Daily   latanoprost  1 drop Both Eyes QHS   levothyroxine  100 mcg Oral QAC breakfast   lisinopril  5 mg Oral Daily   metoprolol succinate  25 mg Oral Daily   multivitamin with minerals  1 tablet Oral Daily   omega-3 acid ethyl esters  1,000 mg Oral Daily   PreserVision AREDS 2  1 capsule Oral BID   sodium chloride flush  3 mL Intravenous Q12H   timolol  1 drop Both Eyes Daily   Continuous Infusions:  sodium chloride     PRN Meds: sodium chloride, acetaminophen, sodium chloride flush   Vital Signs    Vitals:   05/26/23 0849 05/26/23 1358 05/26/23 2039 05/27/23 0537  BP:  (!) 115/59 109/61 121/66  Pulse: 63 (!) 59 (!) 58 (!) 59  Resp:  17 17 16   Temp:  97.6 F (36.4 C) 97.7 F (36.5 C) (!) 97.4 F (36.3 C)  TempSrc:  Oral Oral Oral  SpO2:  95% 96% 95%  Weight:      Height:       No intake or output data in the 24 hours ending 05/27/23 0643 Filed Weights   05/24/23 1424 05/24/23 1440  Weight: 79.4 kg 79.4 kg    Physical Exam    GEN- NAD, A&O x 3. Normal affect.  Lungs- CTAB, Normal effort.  Heart- Regular rate and rhythm. No M/G/R GI- Soft, NT, ND Extremities- No clubbing, cyanosis, or edema Skin- no rash or lesion  Labs    CBC No results for input(s):  "WBC", "NEUTROABS", "HGB", "HCT", "MCV", "PLT" in the last 72 hours. Basic Metabolic Panel Recent Labs    78/29/56 2350 05/27/23 0059  NA 137 136  K 4.2 3.7  CL 107 105  CO2 23 24  GLUCOSE 98 110*  BUN 21 17  CREATININE 0.70 0.73  CALCIUM 9.1 8.7*  MG 2.1 1.8    Telemetry    SB/NSR 50-60s mostly (personally reviewed)  Patient Profile     Maria Trujillo is a 89 y.o. female with a past medical history significant for persistent atrial fibrillation.  They were admitted for tikosyn load.   Assessment & Plan    Persistent atrial fibrillation Pt remains in NSR on Tikosyn 125 mcg BID  Continue Eliquis Creatinine, ser  0.73 (08/01 0059) Magnesium  1.8 (08/01 0059) Potassium3.7 (08/01 0059) Supplement both K and Mg per protocol  A dose was held for QT prolongation. Looks better on lower dose. Will likely plan to continue 125 mg BID today pending MD review of pm EKG.    If continue tikosyn earliest discharge would be tomorrow, 8/2.  For questions or updates, please contact [facility] Please consult [email or web address] for contact info under Cardiology/STEMI.  SignedGraciella Freer, PA-C  05/27/2023, 6:43 AM

## 2023-05-27 NOTE — Progress Notes (Signed)
Pharmacy: Dofetilide (Tikosyn) - Follow Up Assessment and Electrolyte Replacement  Pharmacy consulted to assist in monitoring and replacing electrolytes in this 89 y.o. female admitted on 05/24/2023 undergoing dofetilide initiation. First dofetilide dose: 05/24/23  Labs:    Component Value Date/Time   K 3.7 05/27/2023 0059   K 3.9 12/31/2012 0437   MG 1.8 05/27/2023 0059     Plan: Potassium: K 3.7: Kcl 40 mEq x1 ordered   Magnesium: Mg 1.8-2: Mg 2 gm IV x1 ordered   Thank you for allowing pharmacy to participate in this patient's care   Romie Minus, PharmD PGY1 Pharmacy Resident  Please check AMION for all [facility] Pharmacy phone numbers After 10:00 PM, call Main Pharmacy [phone]

## 2023-05-27 NOTE — Plan of Care (Signed)
  Problem: Clinical Measurements: Goal: Will remain free from infection Outcome: Adequate for Discharge   

## 2023-05-28 ENCOUNTER — Other Ambulatory Visit (HOSPITAL_COMMUNITY): Payer: Self-pay

## 2023-05-28 DIAGNOSIS — I4819 Other persistent atrial fibrillation: Secondary | ICD-10-CM | POA: Diagnosis not present

## 2023-05-28 LAB — BASIC METABOLIC PANEL
Anion gap: 10 (ref 5–15)
BUN: 12 mg/dL (ref 8–23)
CO2: 23 mmol/L (ref 22–32)
Calcium: 8.7 mg/dL — ABNORMAL LOW (ref 8.9–10.3)
Chloride: 105 mmol/L (ref 98–111)
Creatinine, Ser: 0.71 mg/dL (ref 0.44–1.00)
GFR, Estimated: >60 mL/min (ref >60)
Glucose, Bld: 89 mg/dL (ref 70–99)
Potassium: 4.1 mmol/L (ref 3.5–5.1)
Sodium: 138 mmol/L (ref 135–145)

## 2023-05-28 LAB — MAGNESIUM: Magnesium: 2 mg/dL (ref 1.7–2.4)

## 2023-05-28 MED ORDER — DOFETILIDE 125 MCG PO CAPS
125.0000 ug | ORAL_CAPSULE | Freq: Two times a day (BID) | ORAL | 6 refills | Status: DC
  Filled 2023-05-28: qty 60, 30d supply, fill #0

## 2023-05-28 MED ORDER — MAGNESIUM SULFATE 2 GM/50ML IV SOLN
2.0000 g | Freq: Once | INTRAVENOUS | Status: AC
  Administered 2023-05-28: 2 g via INTRAVENOUS
  Filled 2023-05-28: qty 50

## 2023-05-28 NOTE — Progress Notes (Signed)
Pharmacy: Dofetilide (Tikosyn) - Follow Up Assessment and Electrolyte Replacement  Pharmacy consulted to assist in monitoring and replacing electrolytes in this 89 y.o. female admitted on 05/24/2023 undergoing dofetilide initiation. First dofetilide dose: 05/24/23  Labs:    Component Value Date/Time   K 4.1 05/28/2023 0311   MG 2.0 05/28/2023 4098     Plan: Potassium: K >/= 4: No additional supplementation needed  Magnesium: Mg 1.8-2: Give Mg 2 gm IV x1    As patient has required on average 20 mEq of potassium replacement every day, recommend discharging patient with prescription for:  Potassium chloride 10 - 20 mEq  daily  Thank you for allowing pharmacy to participate in this patient's care   Romie Minus, PharmD PGY1 Pharmacy Resident  Please check AMION for all [facility] Pharmacy phone numbers After 10:00 PM, call Main Pharmacy [phone]

## 2023-05-28 NOTE — Discharge Summary (Signed)
ELECTROPHYSIOLOGY DISCHARGE SUMMARY    Patient ID: Maria Trujillo,  MRN: [id number], DOB/AGE: October 04, 1934 89 y.o.  Admit date: 05/24/2023 Discharge date: 05/28/2023  Primary Care Physician: Marina Goodell, MD  Primary Cardiologist: None  Electrophysiologist: Dr. Lalla Brothers   Primary Discharge Diagnosis:  Persistent atrial fibrillation status post Tikosyn loading this admission  Secondary Discharge Diagnosis:  Sinus bradycardia  Allergies  Allergen Reactions   Celecoxib Hives   Dorzolamide Shortness Of Breath    Other Reaction(s): Other (See Comments)  Pt stated had reactions but unable to provide information   Penicillins Other (See Comments), Hives and Rash    Redness (sunburn type rash with peeling)  Has patient had a PCN reaction causing immediate rash, facial/tongue/throat swelling, SOB or lightheadedness with hypotension: No  Has patient had a PCN reaction causing severe rash involving mucus membranes or skin necrosis: No  Has patient had a PCN reaction that required hospitalization No  Has patient had a PCN reaction occurring within the last 10 years: Yes  If all of the above answers are "NO", then may proceed with Cephalosporin use.  Other Reaction(s): Other (See Comments)  Redness (sunburn type rash with peeling)  Has patient had a PCN reaction causing immediate rash, facial/tongue/throat swelling, SOB or lightheadedness with hypotension: No  Has patient had a PCN reaction causing severe rash involving mucus membranes or skin necrosis: No  Has patient had a PCN reaction that required hospitalization No  Has patient had a PCN reaction occurring within the last 10 years: Yes  If all of the above answers are "NO", then may proceed with Cephalosporin use.  Redness and flushing  Redness and flushing  Redness (sunburn type rash with peeling)   Shellfish Allergy Hives and Itching   Timolol Maleate Other (See Comments) and Shortness Of Breath    Redness  Other  Reaction(s): Other (See Comments)  Shortness of breath  Redness  Redness  Higher Doses  Shortness of breath    Redness   Sulfa Antibiotics Other (See Comments)    Redness in eyes   Amoxicillin Rash   Amoxicillin-Pot Clavulanate Other (See Comments)    Redness and flushing   Brimonidine Other (See Comments)    blisters   Cefuroxime Axetil Other (See Comments)    redness   Dipivefrin Other (See Comments)    sleepiness   Oxycodone     BP dropped  Other Reaction(s): Other (See Comments)  Dropped BP down too low  Dropped blood pressure   BP dropped  Dropped BP down too low     Procedures This Admission:  1.  Tikosyn loading   Brief HPI: Maria Trujillo is a 89 y.o. female with a past medical history as noted above.  They were referred to EP for treatment options of atrial fibrillation.  Risks, benefits, and alternatives to Tikosyn were reviewed with the patient who wished to proceed with admission for loading.  Hospital Course:  The patient was admitted and Tikosyn was initiated.  Renal function and electrolytes were followed during the hospitalization.  Their QTc remained stable. The patient converted chemically and did not require cardioversion. The patients QT prolonged, requiring dose reduction to 125 mcg BID after prior reduction to 250, and a dose being held. They were monitored on telemetry up to discharge. On the day of discharge, they were examined by Dr. Lalla Brothers  who considered them stable for discharge to home.  Follow-up has been arranged with the Atrial Fibrillation clinic in approximately  1 week.   Physical Exam: Vitals:   05/27/23 0718 05/27/23 0935 05/27/23 1942 05/28/23 0521  BP: 122/67 111/67 116/68 (!) 115/99  Pulse: (!) 56  (!) 57 63  Resp: 17  17 18   Temp: 97.8 F (36.6 C)  97.7 F (36.5 C) 98 F (36.7 C)  TempSrc: Oral  Oral Oral  SpO2: 96%  96% 94%  Weight:      Height:        GEN- NAD, A&O x 3. Normal affect.  Lungs- CTAB, Normal effort.   Heart- Regular rate and rhythm. No M/G/R GI- Soft, NT, ND Extremities- No clubbing, cyanosis, or edema Skin- no rash or lesion  Labs:   Lab Results  Component Value Date   WBC 5.0 11/18/2022   HGB 11.1 (L) 11/18/2022   HCT 35.2 (L) 11/18/2022   MCV 94.6 11/18/2022   PLT 126 (L) 11/18/2022    Recent Labs  Lab 05/28/23 0311  NA 138  K 4.1  CL 105  CO2 23  BUN 12  CREATININE 0.71  CALCIUM 8.7*  GLUCOSE 89    Discharge Medications:  Allergies as of 05/28/2023       Reactions   Celecoxib Hives   Dorzolamide Shortness Of Breath   Other Reaction(s): Other (See Comments) Pt stated had reactions but unable to provide information   Penicillins Other (See Comments), Hives, Rash   Redness (sunburn type rash with peeling) Has patient had a PCN reaction causing immediate rash, facial/tongue/throat swelling, SOB or lightheadedness with hypotension: No Has patient had a PCN reaction causing severe rash involving mucus membranes or skin necrosis: No Has patient had a PCN reaction that required hospitalization No Has patient had a PCN reaction occurring within the last 10 years: Yes If all of the above answers are "NO", then may proceed with Cephalosporin use. Other Reaction(s): Other (See Comments) Redness (sunburn type rash with peeling)  Has patient had a PCN reaction causing immediate rash, facial/tongue/throat swelling, SOB or lightheadedness with hypotension: No  Has patient had a PCN reaction causing severe rash involving mucus membranes or skin necrosis: No  Has patient had a PCN reaction that required hospitalization No  Has patient had a PCN reaction occurring within the last 10 years: Yes  If all of the above answers are "NO", then may proceed with Cephalosporin use.  Redness and flushing  Redness and flushing  Redness (sunburn type rash with peeling)   Shellfish Allergy Hives, Itching   Timolol Maleate Other (See Comments), Shortness Of Breath   Redness Other  Reaction(s): Other (See Comments) Shortness of breath  Redness  Redness  Higher Doses  Shortness of breath    Redness   Sulfa Antibiotics Other (See Comments)   Redness in eyes   Amoxicillin Rash   Amoxicillin-pot Clavulanate Other (See Comments)   Redness and flushing   Brimonidine Other (See Comments)   blisters   Cefuroxime Axetil Other (See Comments)   redness   Dipivefrin Other (See Comments)   sleepiness   Oxycodone    BP dropped Other Reaction(s): Other (See Comments) Dropped BP down too low Dropped blood pressure   BP dropped  Dropped BP down too low        Medication List     STOP taking these medications    metoprolol succinate 25 MG 24 hr tablet Commonly known as: Toprol XL       TAKE these medications    acetaminophen 650 MG CR tablet Commonly known as: TYLENOL  Take 650 mg by mouth daily as needed for pain.   apixaban 5 MG Tabs tablet Commonly known as: ELIQUIS Take 1 tablet (5 mg total) by mouth 2 (two) times daily.   aspirin 81 MG tablet Take 81 mg by mouth at bedtime.   atorvastatin 40 MG tablet Commonly known as: LIPITOR Take 40 mg by mouth daily.   azelastine 0.1 % nasal spray Commonly known as: ASTELIN Place 1 spray into both nostrils as needed.   bimatoprost 0.03 % ophthalmic solution Commonly known as: LUMIGAN Place 1 drop into the right eye at bedtime.   Calcium Carbonate-Vitamin D3 600-400 MG-UNIT Tabs Take 1 tablet by mouth daily.   cholecalciferol 1000 units tablet Commonly known as: VITAMIN D Take 1,000 Units by mouth daily.   dofetilide 125 MCG capsule Commonly known as: TIKOSYN Take 1 capsule (125 mcg total) by mouth 2 (two) times daily.   fexofenadine 180 MG tablet Commonly known as: ALLEGRA 180 mg daily as needed for allergies.   furosemide 20 MG tablet Commonly known as: LASIX Take 20 mg by mouth daily.   hydrocortisone valerate cream 0.2 % Commonly known as: WESTCORT Apply 1 Application topically 2  (two) times daily.   isosorbide mononitrate 30 MG 24 hr tablet Commonly known as: IMDUR Take 1 tablet (30 mg total) by mouth daily.   levothyroxine 100 MCG tablet Commonly known as: SYNTHROID Take 100 mcg by mouth daily before breakfast.   lisinopril 5 MG tablet Commonly known as: ZESTRIL Take 1 tablet (5 mg total) by mouth daily.   multivitamin with minerals Tabs tablet Take 1 tablet by mouth daily.   PRESERVISION AREDS 2 PO Take 1 tablet by mouth 2 (two) times daily.   timolol 0.5 % ophthalmic solution Commonly known as: TIMOPTIC Place 1 drop into the right eye in the morning.        Disposition:    Follow-up Information     Fenton, Clint R, PA Follow up.   Specialty: Cardiology Why: on 8/9 at 1030 in the A-fib clinic Contact information: [address] [place] [address] [phone]                 Duration of Discharge Encounter: Greater than 30 minutes including physician time.  Dustin Flock, PA-C  05/28/2023 9:47 AM

## 2023-05-28 NOTE — Progress Notes (Signed)
EKG from yesterday evening 05/27/2023 reviewed     Shows remains in NSR with stable QTc at ~440-460 ms when measured manually  Continue  Tikosyn 125 mcg BID.   Potassium4.1 (08/02 0347) Magnesium  2.0 (08/02 0311) Creatinine, ser  0.71 (08/02 0311)  Plan for home later this am after MD sees.  She has completed load.   Graciella Freer, PA-C  05/28/2023 8:30 AM

## 2023-05-28 NOTE — Progress Notes (Signed)
Pt had an 11-beat-run of V-tach @ 0121, strip has been saved, pt resting comfortably with no complaints of CP or shortness of breath. Attempted to notify Falk-Martin, MD. Will continue to monitor.  Bari Edward, RN

## 2023-05-31 ENCOUNTER — Other Ambulatory Visit (HOSPITAL_COMMUNITY): Payer: Self-pay | Admitting: *Deleted

## 2023-05-31 ENCOUNTER — Telehealth (HOSPITAL_COMMUNITY): Payer: Self-pay | Admitting: *Deleted

## 2023-05-31 MED ORDER — METOPROLOL SUCCINATE ER 25 MG PO TB24: 12.5000 mg | ORAL_TABLET | Freq: Every day | ORAL | Status: DC

## 2023-05-31 NOTE — Telephone Encounter (Signed)
Patient daughter tracey called in stating patient went back into afib over the weekend. HRs 100-120 BP stable. Overall feels ok but apple watch confirming rhythm. Discussed with Jorja Loa PA will resume metoprolol 12.5mg  once a day follow up as scheduled.

## 2023-06-04 ENCOUNTER — Ambulatory Visit (HOSPITAL_COMMUNITY)
Admit: 2023-06-04 | Discharge: 2023-06-04 | Disposition: A | Discharge status: Home / Self Care | Payer: HMO | Attending: Physician Assistant | Admitting: Physician Assistant

## 2023-06-04 ENCOUNTER — Encounter (HOSPITAL_COMMUNITY): Payer: Self-pay | Admitting: Physician Assistant

## 2023-06-04 VITALS — BP 124/84 | HR 101 | Ht 64.0 in | Wt 169.8 lb

## 2023-06-04 DIAGNOSIS — D6869 Other thrombophilia: Secondary | ICD-10-CM | POA: Diagnosis not present

## 2023-06-04 DIAGNOSIS — Z7901 Long term (current) use of anticoagulants: Secondary | ICD-10-CM | POA: Insufficient documentation

## 2023-06-04 DIAGNOSIS — I110 Hypertensive heart disease with heart failure: Secondary | ICD-10-CM | POA: Insufficient documentation

## 2023-06-04 DIAGNOSIS — Z5181 Encounter for therapeutic drug level monitoring: Secondary | ICD-10-CM

## 2023-06-04 DIAGNOSIS — I2510 Atherosclerotic heart disease of native coronary artery without angina pectoris: Secondary | ICD-10-CM | POA: Insufficient documentation

## 2023-06-04 DIAGNOSIS — Z79899 Other long term (current) drug therapy: Secondary | ICD-10-CM

## 2023-06-04 DIAGNOSIS — I4819 Other persistent atrial fibrillation: Secondary | ICD-10-CM

## 2023-06-04 DIAGNOSIS — I252 Old myocardial infarction: Secondary | ICD-10-CM | POA: Diagnosis not present

## 2023-06-04 DIAGNOSIS — I5032 Chronic diastolic (congestive) heart failure: Secondary | ICD-10-CM | POA: Insufficient documentation

## 2023-06-04 LAB — CBC
HCT: 36 % (ref 36.0–46.0)
Hemoglobin: 11.5 g/dL — ABNORMAL LOW (ref 12.0–15.0)
MCH: 29.4 pg (ref 26.0–34.0)
MCHC: 31.9 g/dL (ref 30.0–36.0)
MCV: 92.1 fL (ref 80.0–100.0)
Platelets: 166 10*3/uL (ref 150–400)
RBC: 3.91 MIL/uL (ref 3.87–5.11)
RDW: 14.2 % (ref 11.5–15.5)
WBC: 6.4 10*3/uL (ref 4.0–10.5)
nRBC: 0 % (ref 0.0–0.2)

## 2023-06-04 LAB — BASIC METABOLIC PANEL
Anion gap: 8 (ref 5–15)
BUN: 16 mg/dL (ref 8–23)
CO2: 25 mmol/L (ref 22–32)
Calcium: 9.2 mg/dL (ref 8.9–10.3)
Chloride: 106 mmol/L (ref 98–111)
Creatinine, Ser: 0.77 mg/dL (ref 0.44–1.00)
GFR, Estimated: >60 mL/min (ref >60)
Glucose, Bld: 100 mg/dL — ABNORMAL HIGH (ref 70–99)
Potassium: 3.9 mmol/L (ref 3.5–5.1)
Sodium: 139 mmol/L (ref 135–145)

## 2023-06-04 LAB — MAGNESIUM: Magnesium: 2 mg/dL (ref 1.7–2.4)

## 2023-06-04 MED ORDER — METOPROLOL SUCCINATE ER 25 MG PO TB24: 12.5000 mg | ORAL_TABLET | Freq: Every day | ORAL | Status: DC | PRN

## 2023-06-04 NOTE — Progress Notes (Addendum)
Primary Care Physician: Marina Goodell, MD Primary Cardiologist: None Electrophysiologist: Lanier Prude, MD  Referring Physician: Dr Sandra Cockayne is a 89 y.o. female with a history of CAD, HTN, HLD, hypothyroidism, chronic HFpEF, atrial fibrillation who presents for follow up in the [facility].  Her atrial fibrillation was diagnosed in 2022 on a heart monitor. She had a long history of palpitations and PACs prior to that diagnosis being made. The patient had a cardioversion on Mar 04, 2023. She felt well for 1 week after the cardioversion but then developed recurrent palpitations. Patient is on Eliquis for a CHADS2VASC score of 6.  On follow up today, patient is s/p dofetilide loading 7/29-05/28/23. She converted with the medication and did not require DCCV. Her BB was stopped inpatient due to bradycardia. She called the clinic 8/5 reporting that she had gone back into afib with HR 100-120 bpm. Her metoprolol was resumed at half the dose but she only took one dose because she was concerned about bradycardia. Her rates have been 80s-100s since then. She remains out of rhythm today. Fortunately, she is not very symptomatic today.   Today, she denies symptoms of palpitations, chest pain, shortness of breath, orthopnea, PND, lower extremity edema, dizziness, presyncope, syncope, snoring, daytime somnolence, bleeding, or neurologic sequela. The patient is tolerating medications without difficulties and is otherwise without complaint today.    Atrial Fibrillation Risk Factors:  she does not have symptoms or diagnosis of sleep apnea. she does not have a history of rheumatic fever.   Atrial Fibrillation Management history:  Previous antiarrhythmic drugs: none Previous cardioversions: 03/04/23 Previous ablations: none Anticoagulation history: Eliquis  ROS- All systems are reviewed and negative except as per the HPI above.   Physical Exam: BP  124/84   Pulse (!) 101   Ht 5\' 4"  (1.626 m)   Wt 77 kg   BMI 29.15 kg/m   GEN: Well nourished, well developed in no acute distress NECK: No JVD; No carotid bruits CARDIAC: Irregularly irregular rate and rhythm, no murmurs, rubs, gallops RESPIRATORY:  Clear to auscultation without rales, wheezing or rhonchi  ABDOMEN: Soft, non-tender, non-distended EXTREMITIES:  No edema; No deformity   Wt Readings from Last 3 Encounters:  06/04/23 77 kg  05/24/23 79.4 kg  05/24/23 79.1 kg     EKG today demonstrates  Atypical atrial flutter with variable block vs coarse afib Vent. rate 101 BPM PR interval * ms QRS duration 74 ms QT/QTcB 314/407 ms  Echo 11/03/20 demonstrated   1. Left ventricular ejection fraction, by estimation, is 55 to 60%. The  left ventricle has normal function. The left ventricle has no regional  wall motion abnormalities. Left ventricular diastolic parameters were  normal.   2. Right ventricular systolic function is normal. The right ventricular  size is normal.   3. The mitral valve is normal in structure. Mild to moderate mitral valve  regurgitation. No evidence of mitral stenosis.   4. Tricuspid valve regurgitation is mild to moderate.   5. The aortic valve is normal in structure. Aortic valve regurgitation is  not visualized. No aortic stenosis is present.   6. The inferior vena cava is normal in size with greater than 50%  respiratory variability, suggesting right atrial pressure of 3 mmHg.    CHA2DS2-VASc Score = 6  The patient's score is based upon: CHF History: 1 HTN History: 1 Diabetes History: 0 Stroke History: 0 Vascular Disease History: 1 Age  Score: 2 Gender Score: 1       ASSESSMENT AND PLAN: Persistent Atrial Fibrillation (ICD10:  I48.19) The patient's CHA2DS2-VASc score is 6, indicating a 9.7% annual risk of stroke.   S/p dofetilide admission 7/29-05/28/23 We discussed rhythm control options today. Will plan for DCCV. Continue dofetilide  125 mcg BID Check bmet/mag/cbc today Continue Eliquis 5 mg BID Continue Toprol 12.5 mg daily PRN for heart racing.   Secondary Hypercoagulable State (ICD10:  D68.69) The patient is at significant risk for stroke/thromboembolism based upon her CHA2DS2-VASc Score of 6.  Continue Apixaban (Eliquis).   HTN Stable on current regimen  CAD S/p NSTEMI No anginal symptoms  Chronic HFpEF Fluid status appears stable today    Follow up 1 week post DCCV. Patient prefers to be followed in the [place] office.    Informed Consent   Shared Decision Making/Informed Consent The risks (stroke, cardiac arrhythmias rarely resulting in the need for a temporary or permanent pacemaker, skin irritation or burns and complications associated with conscious sedation including aspiration, arrhythmia, respiratory failure and death), benefits (restoration of normal sinus rhythm) and alternatives of a direct current cardioversion were explained in detail to Ms. Ghazi and she agrees to proceed.        Jorja Loa PA-C Afib Clinic [facility] [address] [place], [place] [address] [phone]

## 2023-06-04 NOTE — Patient Instructions (Addendum)
Cardioversion scheduled for: Friday, August 16th   - Arrive at the [facility] and go to admitting at 8am   - Do not eat or drink anything after midnight the night prior to your procedure.   - Take all your morning medication (except diabetic medications) with a sip of water prior to arrival.  - You will not be able to drive home after your procedure.    - Do NOT miss any doses of your blood thinner - if you should miss a dose please notify our office immediately.   - If you feel as if you go back into normal rhythm prior to scheduled cardioversion, please notify our office immediately.   If your procedure is canceled in the cardioversion suite you will be charged a cancellation fee.    Follow up in the [place] office-  their office will call to schedule

## 2023-06-08 ENCOUNTER — Encounter: Payer: Self-pay | Admitting: Cardiology

## 2023-06-09 ENCOUNTER — Telehealth (HOSPITAL_COMMUNITY): Payer: Self-pay

## 2023-06-09 NOTE — Telephone Encounter (Signed)
Patient and daughter notified I have cancelled the Cardioversion. They both verbalized understanding.

## 2023-06-11 ENCOUNTER — Ambulatory Visit (HOSPITAL_COMMUNITY): Admit: 2023-06-11 | Payer: HMO | Admitting: Cardiovascular Disease

## 2023-06-11 ENCOUNTER — Encounter (HOSPITAL_COMMUNITY): Payer: Self-pay

## 2023-06-11 SURGERY — CARDIOVERSION
Anesthesia: General

## 2023-06-21 ENCOUNTER — Ambulatory Visit: Payer: HMO | Attending: Cardiology | Admitting: Cardiology

## 2023-06-21 ENCOUNTER — Encounter: Payer: Self-pay | Admitting: Cardiology

## 2023-06-21 VITALS — BP 130/60 | HR 60 | Ht 64.0 in | Wt 168.5 lb

## 2023-06-21 DIAGNOSIS — Z79899 Other long term (current) drug therapy: Secondary | ICD-10-CM

## 2023-06-21 DIAGNOSIS — D6869 Other thrombophilia: Secondary | ICD-10-CM | POA: Diagnosis not present

## 2023-06-21 DIAGNOSIS — I4819 Other persistent atrial fibrillation: Secondary | ICD-10-CM

## 2023-06-21 DIAGNOSIS — Z5181 Encounter for therapeutic drug level monitoring: Secondary | ICD-10-CM

## 2023-06-21 MED ORDER — DOFETILIDE 125 MCG PO CAPS: 125.0000 ug | ORAL_CAPSULE | Freq: Two times a day (BID) | ORAL | 3 refills | Status: DC

## 2023-06-21 NOTE — Patient Instructions (Addendum)
Medication Instructions:   Your physician recommends that you continue on your current medications as directed. Please refer to the Current Medication list given to you today.  *If you need a refill on your cardiac medications before your next appointment, please call your pharmacy*   Lab Work:  NONE  If you have labs (blood work) drawn today and your tests are completely normal, you will receive your results only by: MyChart Message (if you have MyChart) OR A paper copy in the mail If you have any lab test that is abnormal or we need to change your treatment, we will call you to review the results.   Testing/Procedures:  NONE   Follow-Up: At [facility], you and your health needs are our priority.  As part of our continuing mission to provide you with exceptional heart care, we have created designated Provider Care Teams.  These Care Teams include your primary Cardiologist (physician) and Advanced Practice Providers (APPs -  Physician Assistants and Nurse Practitioners) who all work together to provide you with the care you need, when you need it.  We recommend signing up for the patient portal called "MyChart".  Sign up information is provided on this After Visit Summary.  MyChart is used to connect with patients for Virtual Visits (Telemedicine).  Patients are able to view lab/test results, encounter notes, upcoming appointments, etc.  Non-urgent messages can be sent to your provider as well.   To learn more about what you can do with MyChart, go to [email or web address].    Your next appointment:   3 month(s)  Provider:   Sherie Don, NP    Other Instructions  **Please fill out and return your Eliquis pt assistance application to our office as soon as possible.  **Please have your medical office reach out to our office for cardiac clearance once you are ready to proceed with your procedure.

## 2023-06-21 NOTE — Progress Notes (Signed)
Cardiology Office Note Date:  06/21/2023  Patient ID:  Maria Trujillo, Maria Trujillo 1933-12-15, MRN [id number] PCP:  Marina Goodell, MD  Cardiologist:  Marcina Millard, MD Electrophysiologist: Lanier Prude, MD    Chief Complaint: [name] follow-up  History of Present Illness: Maria Trujillo AGE is a 89 y.o. female with PMH notable for parox Afib, PAC, CAD, HTN; seen today for Lanier Prude, MD for routine electrophysiology followup.   She is s/p tikosyn load 7/29-05/28/23. While loading she converted to NSR. Her BB was stopped inpatient d/t bradycardia. She called afib clinic after her admission with concerns she had returned to afib, HR in the 80-100s. She was told to resume 1/2 dose metop (12.5mg ), and she did take 1-2 times but not daily for fear of bradycardia.  She was seen in Afib clinic 8/9 for routine post-tikosyn loading appt and was found to be in Afib. She was set up for DCCV at that appointment. She messaged clinic 8/13 with [employer] strips, PA [place] reviewed and determined she was in NSR and cancelled DCCV.  She saw Dr. Darrold Junker 8/20 for routine cardiology follow-up, notes stated that she was maintaining NSR.   On follow-up, patient feels well currently, and is tolerating tikosyn well. She thinks she has been maintaining NSR for about the last week. Prior to this, she thinks she may have had a GI virus because in addition to having AFib, she was nauseated. Normally, when she has AFib, she has increased fatigue, chest palpiations, but never nausea.  She diligently takes tikosyn BID 8a/8p, no missed doses. She sets alarm for PM doses so that she does not forget.  She diligently takes eliquis BID, too. No missed doses, no bleeding concerns. She is concerned about affordability with eliquis as she is currently in the doughnut hole.    she denies chest pain, palpitations, dyspnea, PND, orthopnea, nausea, vomiting, dizziness, syncope, edema, weight gain, or early satiety.      AAD History: Tikosyn  Past Medical History:  Diagnosis Date   Arthritis    History of kidney stones    Hypothyroidism    Myocardial infarct (HCC)    PONV (postoperative nausea and vomiting)     Past Surgical History:  Procedure Laterality Date   BREAST EXCISIONAL BIOPSY Bilateral 1984   benign   CARDIOVERSION N/A 03/04/2023   Procedure: CARDIOVERSION;  Surgeon: Marcina Millard, MD;  Location: [facility] ORS;  Service: Cardiovascular;  Laterality: N/A;   CATARACT EXTRACTION, BILATERAL     CHOLECYSTECTOMY     EYE SURGERY     JOINT REPLACEMENT Left 12/2012   knee   KNEE ARTHROPLASTY Right 02/15/2017   Procedure: COMPUTER ASSISTED TOTAL KNEE ARTHROPLASTY;  Surgeon: Donato Heinz, MD;  Location: [facility] ORS;  Service: Orthopedics;  Laterality: Right;   LEFT HEART CATH AND CORONARY ANGIOGRAPHY N/A 11/04/2020   Procedure: LEFT HEART CATH AND CORONARY ANGIOGRAPHY;  Surgeon: Marcina Millard, MD;  Location: [facility] INVASIVE CV LAB;  Service: Cardiovascular;  Laterality: N/A;   PHOTOCOAGULATION WITH LASER Right 04/20/2018   Procedure: TRANSCLERAL DIODE CYCLOPHOTOCOAGULATION  RIGHT per [place] block;  Surgeon: Lockie Mola, MD;  Location: [facility];  Service: Ophthalmology;  Laterality: Right;   ROTATOR CUFF REPAIR Left 2010   TONSILLECTOMY      Current Outpatient Medications  Medication Instructions   acetaminophen (TYLENOL) 650 mg, Oral, Daily PRN   apixaban (ELIQUIS) 5 mg, Oral, 2 times daily   aspirin 81 mg, Oral, Nightly   atorvastatin (LIPITOR) 40  mg, Oral, Daily   azelastine (ASTELIN) 0.1 % nasal spray 1 spray, Each Nare, As needed   bimatoprost (LUMIGAN) 0.03 % ophthalmic solution 1 drop, Right Eye, Daily at bedtime   Calcium Carb-Cholecalciferol (CALCIUM CARBONATE-VITAMIN D3) 600-400 MG-UNIT TABS 1 tablet, Oral, Daily   cholecalciferol (VITAMIN D) 1,000 Units, Oral, Daily   dofetilide (TIKOSYN) 125 mcg, Oral, 2 times daily   fexofenadine (ALLEGRA) 180  mg, Daily PRN   furosemide (LASIX) 20 mg, Oral, Daily   hydrocortisone valerate cream (WESTCORT) 0.2 % 1 Application, Topical, As needed   isosorbide mononitrate (IMDUR) 30 mg, Oral, Daily   levothyroxine (SYNTHROID) 100 mcg, Oral, Daily before breakfast   lisinopril (ZESTRIL) 5 mg, Oral, Daily   metoprolol succinate (TOPROL XL) 12.5 mg, Oral, Daily PRN   Multiple Vitamin (MULTIVITAMIN WITH MINERALS) TABS tablet 1 tablet, Oral, Daily   Multiple Vitamins-Minerals (PRESERVISION AREDS 2 PO) 1 tablet, Oral, 2 times daily   timolol (TIMOPTIC) 0.5 % ophthalmic solution 1 drop, Right Eye, Every morning    Social History:  The patient  reports that she has never smoked. She has never used smokeless tobacco. She reports that she does not drink alcohol and does not use drugs.   Family History: The patient's family history includes Breast cancer (age of onset: 22) in her sister.  ROS:  Please see the history of present illness. All other systems are reviewed and otherwise negative.   PHYSICAL EXAM:  VS:  BP 130/60 (BP Location: Left Arm, Patient Position: Sitting, Cuff Size: Normal)   Pulse 60   Ht 5\' 4"  (1.626 m)   Wt 168 lb 8 oz (76.4 kg)   SpO2 97%   BMI 28.92 kg/m  BMI: Body mass index is 28.92 kg/m.  GEN- The patient is well appearing, alert and oriented x 3 today.   Lungs- Clear to ausculation bilaterally, normal work of breathing.  Heart- Regular rate and rhythm, no murmurs, rubs or gallops Extremities- Trace peripheral edema, warm, dry   EKG is ordered. Personal review of EKG from today shows:   EKG Interpretation Date/Time:  Monday June 21 2023 14:22:43 EDT Ventricular Rate:  60 PR Interval:  190 QRS Duration:  84 QT Interval:  428 QTC Calculation: 428 R Axis:   -25  Text Interpretation: Normal sinus rhythm Confirmed by Sherie Don [phone]) on 06/21/2023 2:35:04 PM     06/04/2023: AFib, rate 101 QT 320, QTC  05/28/2023: SB, rate 56 QT 450, QTC  Recent  Labs: 06/04/2023: BUN 16; Creatinine, Ser 0.77; Hemoglobin 11.5; Magnesium 2.0; Platelets 166; Potassium 3.9; Sodium 139  No results found for requested labs within last 365 days.   CrCl cannot be calculated (Unknown ideal weight.).   Wt Readings from Last 3 Encounters:  06/04/23 169 lb 12.8 oz (77 kg)  05/24/23 175 lb 0.7 oz (79.4 kg)  05/24/23 174 lb 6.4 oz (79.1 kg)     Additional studies reviewed include: Previous EP, cardiology notes.   LHC, 11/04/2020 3rd RPL lesion is 100% stenosed. 1.  Non-ST elevation myocardial infarction 2.  Occluded small caliber RPL 3 3.  Normal left ventricular function  TTE, 11/03/2020  1. Left ventricular ejection fraction, by estimation, is 55 to 60%. The left ventricle has normal function. The left ventricle has no regional wall motion abnormalities. Left ventricular diastolic parameters were normal.   2. Right ventricular systolic function is normal. The right ventricular size is normal.   3. The mitral valve is normal in structure. Mild  to moderate mitral valve regurgitation. No evidence of mitral stenosis.   4. Tricuspid valve regurgitation is mild to moderate.   5. The aortic valve is normal in structure. Aortic valve regurgitation is not visualized. No aortic stenosis is present.   6. The inferior vena cava is normal in size with greater than 50% respiratory variability, suggesting right atrial pressure of 3 mmHg.   ASSESSMENT AND PLAN:  #) parox afib #) high risk medication - tikosyn EKG with stable QTC on [employer] will, has good schedule for medication adherence Reviewed medication list, no interactions noted   #) Hypercoag d/t parox afib CHA2DS2-VASc Score = 6 [CHF History: 1, HTN History: 1, Diabetes History: 0, Stroke History: 0, Vascular Disease History: 1, Age Score: 2, Gender Score: 1].  Therefore, the patient's annual risk of stroke is 9.7 %.    Stroke ppx - 5mg  eliquis BID, appropriately dosed No bleeding  concerns        Current medicines are reviewed at length with the patient today.   The patient does not have concerns regarding her medicines.  The following changes were made today:  none  Labs/ tests ordered today include:  Orders Placed This Encounter  Procedures   EKG 12-Lead     Disposition: Follow up with Dr. Lalla Brothers or EP APP in 3 months   Signed, Sherie Don, NP  06/21/23  12:35 PM  Electrophysiology [facility]

## 2023-06-29 ENCOUNTER — Telehealth: Payer: Self-pay | Admitting: *Deleted

## 2023-06-29 NOTE — Telephone Encounter (Signed)
Spoke with patient and reviewed documents needed to apply for assistance. She requested that I please call back in 20 minutes to speak with her daughter who helps with this kind of stuff. Advised I would be happy to call back.

## 2023-06-29 NOTE — Telephone Encounter (Signed)
Spoke with patients daughter per release form and reviewed documentation needed in order to process application. She verbalized understanding and will come by the office and drop of this documentation sometime week after next. Recommended that when she drops that information off to let them know to give to me. She verbalized understanding of our conversation and had no further questions at this time.

## 2023-07-07 ENCOUNTER — Ambulatory Visit: Payer: HMO | Admitting: Cardiology

## 2023-07-15 NOTE — Telephone Encounter (Signed)
Patient daughter came by office and dropped off patient assistance forms Placed in nurse box

## 2023-07-16 ENCOUNTER — Other Ambulatory Visit: Payer: Self-pay | Admitting: *Deleted

## 2023-07-16 MED ORDER — APIXABAN 5 MG PO TABS: 5.0000 mg | ORAL_TABLET | Freq: Two times a day (BID) | ORAL | 3 refills | Status: DC

## 2023-07-16 MED ORDER — APIXABAN 5 MG PO TABS: 5.0000 mg | ORAL_TABLET | Freq: Two times a day (BID) | ORAL | 3 refills | Status: AC

## 2023-07-16 NOTE — Telephone Encounter (Signed)
Application completed and faxed to company. No further needs

## 2023-07-16 NOTE — Addendum Note (Signed)
Addended by: Bryna Colander on: 07/16/2023 09:19 AM   Modules accepted: Orders

## 2023-07-16 NOTE — Telephone Encounter (Signed)
Spoke with patient and reviewed that we had the original social security statement and that we only needed a copy. Inquired if she would like to pick that up or mail it back to her. She requested to have it mailed to her. She did want to know when we would hear something back on the application. Advised that I would fax application today and hopefully hear something by the end of next week. She verbalized understanding of our conversation with no further questions at this time.

## 2023-07-25 ENCOUNTER — Encounter: Payer: Self-pay | Admitting: *Deleted

## 2023-07-25 ENCOUNTER — Other Ambulatory Visit: Payer: Self-pay

## 2023-07-25 ENCOUNTER — Ambulatory Visit
Admission: EM | Admit: 2023-07-25 | Discharge: 2023-07-25 | Disposition: A | Discharge status: Home / Self Care | Payer: HMO | Source: Home / Self Care

## 2023-07-25 ENCOUNTER — Emergency Department
Admission: EM | Admit: 2023-07-25 | Discharge: 2023-07-25 | Disposition: A | Discharge status: Home / Self Care | Payer: HMO | Attending: Emergency Medicine | Admitting: Emergency Medicine

## 2023-07-25 ENCOUNTER — Ambulatory Visit (INDEPENDENT_AMBULATORY_CARE_PROVIDER_SITE_OTHER): Payer: HMO

## 2023-07-25 DIAGNOSIS — E039 Hypothyroidism, unspecified: Secondary | ICD-10-CM | POA: Diagnosis not present

## 2023-07-25 DIAGNOSIS — R001 Bradycardia, unspecified: Secondary | ICD-10-CM | POA: Diagnosis not present

## 2023-07-25 DIAGNOSIS — R55 Syncope and collapse: Secondary | ICD-10-CM

## 2023-07-25 DIAGNOSIS — I491 Atrial premature depolarization: Secondary | ICD-10-CM | POA: Insufficient documentation

## 2023-07-25 DIAGNOSIS — R42 Dizziness and giddiness: Secondary | ICD-10-CM | POA: Insufficient documentation

## 2023-07-25 DIAGNOSIS — R350 Frequency of micturition: Secondary | ICD-10-CM

## 2023-07-25 DIAGNOSIS — I10 Essential (primary) hypertension: Secondary | ICD-10-CM | POA: Insufficient documentation

## 2023-07-25 DIAGNOSIS — Z20822 Contact with and (suspected) exposure to covid-19: Secondary | ICD-10-CM | POA: Diagnosis not present

## 2023-07-25 LAB — COMPREHENSIVE METABOLIC PANEL
ALT: 26 U/L (ref 0–44)
AST: 36 U/L (ref 15–41)
Albumin: 3.4 g/dL — ABNORMAL LOW (ref 3.5–5.0)
Alkaline Phosphatase: 86 U/L (ref 38–126)
Anion gap: 4 — ABNORMAL LOW (ref 5–15)
BUN: 15 mg/dL (ref 8–23)
CO2: 28 mmol/L (ref 22–32)
Calcium: 9.4 mg/dL (ref 8.9–10.3)
Chloride: 109 mmol/L (ref 98–111)
Creatinine, Ser: 0.6 mg/dL (ref 0.44–1.00)
GFR, Estimated: >60 mL/min (ref >60)
Glucose, Bld: 125 mg/dL — ABNORMAL HIGH (ref 70–99)
Potassium: 3.7 mmol/L (ref 3.5–5.1)
Sodium: 141 mmol/L (ref 135–145)
Total Bilirubin: 0.9 mg/dL (ref 0.3–1.2)
Total Protein: 6.4 g/dL — ABNORMAL LOW (ref 6.5–8.1)

## 2023-07-25 LAB — URINALYSIS, W/ REFLEX TO CULTURE (INFECTION SUSPECTED)
Bilirubin Urine: NEGATIVE
Glucose, UA: NEGATIVE mg/dL
Ketones, ur: NEGATIVE mg/dL
Nitrite: NEGATIVE
Protein, ur: NEGATIVE mg/dL
Specific Gravity, Urine: 1.01 (ref 1.005–1.030)
pH: 6.5 (ref 5.0–8.0)

## 2023-07-25 LAB — CBC WITH DIFFERENTIAL/PLATELET
Abs Immature Granulocytes: 0.01 10*3/uL (ref 0.00–0.07)
Basophils Absolute: 0 10*3/uL (ref 0.0–0.1)
Basophils Relative: 0 %
Eosinophils Absolute: 0.3 10*3/uL (ref 0.0–0.5)
Eosinophils Relative: 6 %
HCT: 34.7 % — ABNORMAL LOW (ref 36.0–46.0)
Hemoglobin: 11.2 g/dL — ABNORMAL LOW (ref 12.0–15.0)
Immature Granulocytes: 0 %
Lymphocytes Relative: 20 %
Lymphs Abs: 1.1 10*3/uL (ref 0.7–4.0)
MCH: 29.7 pg (ref 26.0–34.0)
MCHC: 32.3 g/dL (ref 30.0–36.0)
MCV: 92 fL (ref 80.0–100.0)
Monocytes Absolute: 0.5 10*3/uL (ref 0.1–1.0)
Monocytes Relative: 9 %
Neutro Abs: 3.5 10*3/uL (ref 1.7–7.7)
Neutrophils Relative %: 65 %
Platelets: 142 10*3/uL — ABNORMAL LOW (ref 150–400)
RBC: 3.77 MIL/uL — ABNORMAL LOW (ref 3.87–5.11)
RDW: 13.5 % (ref 11.5–15.5)
WBC: 5.4 10*3/uL (ref 4.0–10.5)
nRBC: 0 % (ref 0.0–0.2)

## 2023-07-25 LAB — TROPONIN I (HIGH SENSITIVITY)
Troponin I (High Sensitivity): 15 ng/L (ref <18)
Troponin I (High Sensitivity): 16 ng/L (ref <18)

## 2023-07-25 LAB — SARS CORONAVIRUS 2 BY RT PCR: SARS Coronavirus 2 by RT PCR: NEGATIVE

## 2023-07-25 LAB — TSH: TSH: 0.992 u[IU]/mL (ref 0.350–4.500)

## 2023-07-25 MED ORDER — SODIUM CHLORIDE 0.9 % IV BOLUS
500.0000 mL | Freq: Once | INTRAVENOUS | Status: AC
  Administered 2023-07-25: 500 mL via INTRAVENOUS

## 2023-07-25 NOTE — ED Notes (Signed)
Patient is being discharged from the Urgent Care and sent to the Emergency Department via private vehicle . Per Eusebio Friendly, PA, patient is in need of higher level of care due to lightheaded and dizziness and abnormal EkG. Patient is aware and verbalizes understanding of plan of care.  Vitals:   07/25/23 0823  BP: 134/70  Pulse: (!) 59  Temp: 97.9 F (36.6 C)  SpO2: 100%

## 2023-07-25 NOTE — Discharge Instructions (Signed)
Make sure to drink plenty of fluids and take all of your normal medications as prescribed.  Follow-up with your primary care provider.  Return to the ER for new, worsening, or persistent severe weakness or dizziness, feel like you are going to pass out, chest pain, difficulty breathing, vomiting, fever, or any other new or worsening symptoms that concern you.

## 2023-07-25 NOTE — ED Triage Notes (Signed)
Pt here with dizziness since Thursday. Pt states she is getting tired more easier than normal. Pt denies pain. Pt denies fall or injury.

## 2023-07-25 NOTE — Discharge Instructions (Signed)

## 2023-07-25 NOTE — ED Triage Notes (Signed)
Patient states urinary frequency and dizziness since Thursday, also had chills Thurs/Fri

## 2023-07-25 NOTE — ED Provider Notes (Signed)
[facility] Provider Note    Event Date/Time   First MD Initiated Contact with Patient 07/25/23 1030     (approximate)   History   Dizziness   HPI  Maria Trujillo is a 89 y.o. female with a history of atrial fibrillation on Eliquis, MI, hypertension, GERD, and hypothyroidism who presents with lightheadedness over the last 3 days, persistent course, worse when she gets up and moves around, not associated with any spinning or vertigo.  She also has had some intermittent palpitations.  She denies any nausea or vomiting, difficulty breathing, chest pain, fever or chills, or any urinary symptoms.  She was seen in urgent care this morning and referred to the ED for labs.    I reviewed the past medical records.  The patient was admitted in August for treatment of the atrial fibrillation with Tikosyn.  Urinalysis this morning in urgent care was negative for acute findings.   Physical Exam   Triage Vital Signs: ED Triage Vitals  Encounter Vitals Group     BP 07/25/23 1023 (!) 141/52     Systolic BP Percentile --      Diastolic BP Percentile --      Pulse Rate 07/25/23 1023 (!) 59     Resp 07/25/23 1023 16     Temp 07/25/23 1023 (!) 97.4 F (36.3 C)     Temp Source 07/25/23 1023 Oral     SpO2 07/25/23 1023 100 %     Weight 07/25/23 1022 164 lb 14.5 oz (74.8 kg)     Height 07/25/23 1022 5\' 4"  (1.626 m)     Head Circumference --      Peak Flow --      Pain Score 07/25/23 1022 0     Pain Loc --      Pain Education --      Exclude from Growth Chart --     Most recent vital signs: Vitals:   07/25/23 1023  BP: (!) 141/52  Pulse: (!) 59  Resp: 16  Temp: (!) 97.4 F (36.3 C)  SpO2: 100%     General: Awake, no distress.  CV:  Good peripheral perfusion.  Resp:  Normal effort.  Abd:  No distention.  Other:  EOMI except for left eye medial gaze deficit which is chronic.  PERRLA.  No facial droop.  Normal speech.  Motor and sensory intact in all  extremities.   ED Results / Procedures / Treatments   Labs (all labs ordered are listed, but only abnormal results are displayed) Labs Reviewed  COMPREHENSIVE METABOLIC PANEL - Abnormal; Notable for the following components:      Result Value   Glucose, Bld 125 (*)    Total Protein 6.4 (*)    Albumin 3.4 (*)    Anion gap 4 (*)    All other components within normal limits  CBC WITH DIFFERENTIAL/PLATELET - Abnormal; Notable for the following components:   RBC 3.77 (*)    Hemoglobin 11.2 (*)    HCT 34.7 (*)    Platelets 142 (*)    All other components within normal limits  SARS CORONAVIRUS 2 BY RT PCR  TSH  TROPONIN I (HIGH SENSITIVITY)  TROPONIN I (HIGH SENSITIVITY)     EKG  ED ECG REPORT I, Dionne Bucy, the attending physician, personally viewed and interpreted this ECG.  Date: 07/25/2023 EKG Time: 1035 Rate: 62 Rhythm: Supraventricular bigeminy QRS Axis: normal Intervals: normal ST/T Wave abnormalities: Nonspecific T wave abnormalities  Narrative Interpretation: no evidence of acute ischemia    RADIOLOGY    PROCEDURES:  Critical Care performed: No  Procedures   MEDICATIONS ORDERED IN ED: Medications  sodium chloride 0.9 % bolus 500 mL (0 mLs Intravenous Stopped 07/25/23 1236)     IMPRESSION / MDM / ASSESSMENT AND PLAN / ED COURSE  I reviewed the triage vital signs and the nursing notes.  89 year old female with PMH as noted above presents with lightheadedness for the last several days associated with intermittent palpitations.  Physical exam is unremarkable for acute findings.  Differential diagnosis includes, but is not limited to, dehydration/hypovolemia, electrolyte abnormality, other metabolic disturbance, paroxysmal atrial fibrillation, other cardiac etiology, thyroid dysfunction, viral syndrome.  UTI has been ruled out at urgent care.  We will obtain lab workup, give fluids, and reassess.  Patient's presentation is most consistent with  acute complicated illness / injury requiring diagnostic workup.  The patient is on the cardiac monitor to evaluate for evidence of arrhythmia and/or significant heart rate changes.  ----------------------------------------- 12:56 PM on 07/25/2023 -----------------------------------------  Lab workup is unremarkable.  Troponin is negative.  COVID test is negative.  CBC shows no leukocytosis or anemia.  Potassium and other electrolytes are unremarkable.  TSH is within normal limits.  The patient states she is feeling significantly better after fluids.  We checked orthostatics and they are normal.  The patient did not have any orthostatic symptoms.  Although the initial EKG showed supraventricular bigeminy, she has otherwise stayed in sinus rhythm on the monitor throughout her ED stay.  Overall presentation is consistent with dehydration, viral syndrome or other benign etiology.  I did consider whether the patient may benefit from admission given her age and comorbidities, however she states she is comfortable to go home.  Given the negative workup, discharge is reasonable.  I counseled her and her family member on the results of the workup.  I instructed her to follow-up with her primary care provider.  I gave strict return precautions and she expressed understanding.   FINAL CLINICAL IMPRESSION(S) / ED DIAGNOSES   Final diagnoses:  Lightheadedness     Rx / DC Orders   ED Discharge Orders     None        Note:  This document was prepared using Dragon voice recognition software and may include unintentional dictation errors.    Dionne Bucy, MD 07/25/23 1257

## 2023-07-25 NOTE — ED Provider Notes (Signed)
[facility]    CSN: [id number] Arrival date & time: 07/25/23  0806      History   Chief Complaint Chief Complaint  Patient presents with   Dizziness   Urinary Frequency    HPI Maria Trujillo is a 89 y.o. female with history of atrial fibrillation, previous MI, hypertension, GERD, hypothyroidism, long-term anticoagulation with Eliquis, and bradycardia.  Presents with her daughter today with reports of lightheadedness/intermittent dizziness, fatigue and feeling faint for the past 3 days. Reports palpitations 3 days ago.  She says she took Metoprolol and felt better.  Has not had any palpitations since and denies chest pain/tightness, shortness of breath, leg swelling, back pain.  No report of headaches, vision changes, facial drooping or numbness, falls, head injury, syncope.  Patient has a history of sinusitis send patient's daughter reports where she has felt the symptoms before she has had a sinus infection but at this time patient is not complaining of sinus pain/pressure, nasal congestion/postnasal drainage, sore throat, runny nose, headaches, ear pressure, hearing loss.  No fever, but has had chills. Reporting urinary frequency but denies urgency or dysuria. Has been taking a "fluid pill."  Has had soft stools.  Denies abdominal pain or blood in stool.  No flank pain.  History of kidney stones.  Daughter wonders if she could have a kidney stone.  States they usually start out with urinary frequency and then she has other symptoms.  She would like to have her checked for possible kidney stone today.  Daughter does report that when she saw her the other day and patient was complaining of dizziness she checked her blood pressure and it was reportedly 80/60 at 1 pm.  It has been low a couple of times when checked but is currently 134/70 and patient is not really feeling dizzy at this time, only slightly lightheaded.  Of note, patient saw cardiologist 1 month ago.  Had EKG  performed at that time which was normal.  Heart rate 60.  HPI  Past Medical History:  Diagnosis Date   Arthritis    History of kidney stones    Hypothyroidism    Myocardial infarct (HCC)    PONV (postoperative nausea and vomiting)     Patient Active Problem List   Diagnosis Date Noted   Hypercoagulable state due to persistent atrial fibrillation (HCC) 06/04/2023   Persistent atrial fibrillation (HCC) 05/24/2023   Acquired hypothyroidism 12/25/2020   Nontraumatic complete tear of right rotator cuff 12/25/2020   NSTEMI (non-ST elevated myocardial infarction) (HCC) 11/03/2020   Primary open angle glaucoma (POAG) of both eyes, severe stage 11/01/2018   Primary osteoarthritis involving multiple joints 02/18/2017   Esophageal reflux 02/15/2017   HTN (hypertension) 02/15/2017   S/P total knee arthroplasty 02/15/2017   Hyperlipidemia, unspecified 03/06/2014   Restless leg syndrome 03/06/2014    Past Surgical History:  Procedure Laterality Date   BREAST EXCISIONAL BIOPSY Bilateral 1984   benign   CARDIOVERSION N/A 03/04/2023   Procedure: CARDIOVERSION;  Surgeon: Marcina Millard, MD;  Location: [facility] OR[facility];  Service: Cardiovascular;  Laterality: N/A;   CATARACT EXTRACTION, BILATERAL     CHOLECYSTECTOMY     EYE SURGERY     JOINT REPLACEMENT Left 12/2012   knee   KNEE ARTHROPLASTY Right 02/15/2017   Procedure: COMPUTER ASSISTED TOTAL KNEE ARTHROPLASTY;  Surgeon: Donato Heinz, MD;  Location: [facility];  Service: Orthopedics;  Laterality: Right;   LEFT HEART CATH AND CORONARY ANGIOGRAPHY N/A 11/04/2020   Procedure: LEFT  HEART CATH AND CORONARY ANGIOGRAPHY;  Surgeon: Marcina Millard, MD;  Location: [facility];  Service: Cardiovascular;  Laterality: N/A;   PHOTOCOAGULATION WITH LASER Right 04/20/2018   Procedure: TRANSCLERAL DIODE CYCLOPHOTOCOAGULATION  RIGHT per [place] block;  Surgeon: Lockie Mola, MD;  Location: [facility];  Service: Ophthalmology;   Laterality: Right;   ROTATOR CUFF REPAIR Left 2010   TONSILLECTOMY      OB History   No obstetric history on file.      Home Medications    Prior to Admission medications   Medication Sig Start Date End Date Taking? Authorizing Provider  acetaminophen (TYLENOL) 650 MG CR tablet Take 650 mg by mouth daily as needed for pain.    [provider]  apixaban (ELIQUIS) 5 MG TABS tablet Take 1 tablet (5 mg total) by mouth 2 (two) times daily. 07/16/23 09/14/23  Sherie Don, NP  aspirin 81 MG tablet Take 81 mg by mouth at bedtime.    [provider]  atorvastatin (LIPITOR) 40 MG tablet Take 40 mg by mouth daily.    [provider]  azelastine (ASTELIN) 0.1 % nasal spray Place 1 spray into both nostrils as needed. 11/13/21   [provider]  bimatoprost (LUMIGAN) 0.03 % ophthalmic solution Place 1 drop into the right eye at bedtime.    [provider]  Calcium Carb-Cholecalciferol (CALCIUM CARBONATE-VITAMIN D3) 600-400 MG-UNIT TABS Take 1 tablet by mouth daily.    [provider]  cholecalciferol (VITAMIN D) 1000 units tablet Take 1,000 Units by mouth daily.    [provider]  dofetilide (TIKOSYN) 125 MCG capsule Take 1 capsule (125 mcg total) by mouth 2 (two) times daily. 06/21/23   Sherie Don, NP  fexofenadine (ALLEGRA) 180 MG tablet 180 mg daily as needed for allergies.    [provider]  furosemide (LASIX) 20 MG tablet Take 20 mg by mouth daily. 01/04/23 01/04/24  [provider]  hydrocortisone valerate cream (WESTCORT) 0.2 % Apply 1 Application topically as needed. 03/24/23   [provider]  isosorbide mononitrate (IMDUR) 30 MG 24 hr tablet Take 1 tablet (30 mg total) by mouth daily. 11/06/20 06/21/23  Darlin Priestly, MD  levothyroxine (SYNTHROID, LEVOTHROID) 100 MCG tablet Take 100 mcg by mouth daily before breakfast.    [provider]  lisinopril (ZESTRIL) 5 MG tablet Take 1 tablet (5 mg total)  by mouth daily. 11/06/20 06/21/23  Darlin Priestly, MD  metoprolol succinate (TOPROL XL) 25 MG 24 hr tablet Take 0.5 tablets (12.5 mg total) by mouth daily as needed (for HR over 100). 06/04/23   Fenton, Clint R, PA  Multiple Vitamin (MULTIVITAMIN WITH MINERALS) TABS tablet Take 1 tablet by mouth daily.    [provider]  Multiple Vitamins-Minerals (PRESERVISION AREDS 2 PO) Take 1 tablet by mouth 2 (two) times daily.    [provider]  timolol (TIMOPTIC) 0.5 % ophthalmic solution Place 1 drop into the right eye in the morning. 10/28/20   [provider]    Family History Family History  Problem Relation Age of Onset   Breast cancer Sister 46    Social History Social History   Tobacco Use   Smoking status: Never   Smokeless tobacco: Never  Vaping Use   Vaping status: Never Used  Substance Use Topics   Alcohol use: No   Drug use: No     Allergies   Celecoxib, Dorzolamide, Penicillins, Shellfish allergy, Timolol maleate, Sulfa antibiotics, Amoxicillin, Amoxicillin-pot clavulanate, Brimonidine,  Cefuroxime axetil, Dipivefrin, and Oxycodone   Review of Systems Review of Systems  Constitutional:  Negative for fatigue and fever.  HENT:  Negative for congestion, rhinorrhea, sinus pain and sore throat.   Eyes:  Negative for photophobia and visual disturbance.  Respiratory:  Negative for cough, chest tightness, shortness of breath and wheezing.   Cardiovascular:  Negative for chest pain, palpitations and leg swelling.  Gastrointestinal:  Negative for abdominal pain, diarrhea, nausea and vomiting.  Genitourinary:  Positive for frequency. Negative for decreased urine volume, difficulty urinating, dysuria, flank pain and hematuria.  Musculoskeletal:  Negative for back pain.  Neurological:  Positive for dizziness and light-headedness. Negative for seizures, syncope, facial asymmetry, weakness, numbness and headaches.     Physical Exam Triage Vital Signs ED Triage  Vitals  Encounter Vitals Group     BP      Systolic BP Percentile      Diastolic BP Percentile      Pulse      Resp      Temp      Temp src      SpO2      Weight      Height      Head Circumference      Peak Flow      Pain Score      Pain Loc      Pain Education      Exclude from Growth Chart    No data found.  Updated Vital Signs BP 134/70 (BP Location: Right Arm)   Pulse (!) 59   Temp 97.9 F (36.6 C) (Oral)   Ht 5\' 4"  (1.626 m)   Wt 165 lb (74.8 kg)   SpO2 100%   BMI 28.32 kg/m    Physical Exam Vitals and nursing note reviewed.  Constitutional:      General: She is not in acute distress.    Appearance: Normal appearance. She is not ill-appearing or toxic-appearing.  HENT:     Head: Normocephalic and atraumatic.     Right Ear: Tympanic membrane, ear canal and external ear normal.     Left Ear: Tympanic membrane, ear canal and external ear normal.     Nose: Nose normal.     Mouth/Throat:     Mouth: Mucous membranes are moist.     Pharynx: Oropharynx is clear.  Eyes:     General: No scleral icterus.       Right eye: No discharge.        Left eye: No discharge.     Conjunctiva/sclera: Conjunctivae normal.  Cardiovascular:     Rate and Rhythm: Regular rhythm. Bradycardia present.     Heart sounds: Normal heart sounds.  Pulmonary:     Effort: Pulmonary effort is normal. No respiratory distress.     Breath sounds: Normal breath sounds.  Abdominal:     Palpations: Abdomen is soft.     Tenderness: There is no abdominal tenderness. There is no right CVA tenderness or left CVA tenderness.  Musculoskeletal:     Cervical back: Neck supple.  Skin:    General: Skin is dry.  Neurological:     General: No focal deficit present.     Mental Status: She is alert and oriented to person, place, and time. Mental status is at baseline.     Motor: No weakness.     Coordination: Coordination normal.     Gait: Gait normal.  Psychiatric:        Mood and Affect: Mood  normal.        Behavior: Behavior normal.        Thought Content: Thought content normal.      UC Treatments / Results  Labs (all labs ordered are listed, but only abnormal results are displayed) Labs Reviewed  URINALYSIS, W/ REFLEX TO CULTURE (INFECTION SUSPECTED) - Abnormal; Notable for the following components:      Result Value   Hgb urine dipstick SMALL (*)    Leukocytes,Ua TRACE (*)    Bacteria, UA RARE (*)    All other components within normal limits  URINE CULTURE    EKG   Radiology DG Abdomen 1 View  Result Date: 07/25/2023 CLINICAL DATA:  89 year old female microscopic hematuria. Dizziness. EXAM: ABDOMEN - 1 VIEW COMPARISON:  CT Abdomen and Pelvis 12/11/2014. FINDINGS: Two supine views at 0909 hours. Stable cholecystectomy clips. Negative lung bases. Non obstructed bowel gas pattern. Moderate volume of retained stool. No urinary calculus identified radiographically. Chronic spine degeneration. Bulky endplate osteophytes at T12-L1. No acute osseous abnormality identified. IMPRESSION: Moderate volume retained stool. No urinary calculus identified radiographically. Electronically Signed   By: Odessa Fleming M.D.   On: 07/25/2023 09:41    Procedures ED EKG  Date/Time: 07/25/2023 9:57 AM  Performed by: Shirlee Latch, PA-C Authorized by: Shirlee Latch, PA-C   Previous ECG:    Previous ECG:  Compared to current   Comparison ECG info:  PACs now Interpretation:    Interpretation: abnormal   Rate:    ECG rate:  51   ECG rate assessment: bradycardic   Rhythm:    Rhythm: sinus rhythm   Ectopy:    Ectopy: PAC   QRS:    QRS axis:  Normal   QRS intervals:  Normal   QRS conduction: normal   ST segments:    ST segments:  Normal T waves:    T waves: normal   Comments:     Sinus bradycardia with PACs  (including critical care time)  Medications Ordered in UC Medications - No data to display  Initial Impression / Assessment and Plan / UC Course  I have reviewed the  triage vital signs and the nursing notes.  Pertinent labs & imaging results that were available during my care of the patient were reviewed by me and considered in my medical decision making (see chart for details).   89 year old female with significant past medical history for atrial fibrillation, long-term anticoagulation, previous MI, bradycardia, kidney stones presents with daughter for intermittent dizziness and lightheadedness with fatigue, urinary frequency for the past 3 days.  Reports palpitations at onset and low blood pressure but that has stopped.  Has been taking metoprolol and Eliquis.  Followed up with cardiology last month and had normal EKG at that time.  Of note, patient has been admitted for atrial fibrillation and EKG changes in May and July of this year.  Vitals are stable.  Blood pressure is 134/70.  Pulse 59.  This is consistent with heart rates in the past.  She is overall well-appearing.  No acute distress.  Normal HEENT exam.  Chest clear to auscultation heart regular rhythm with bradycardic rate.  Abdomen soft and nontender.  No CVA tenderness.  No swelling of lower extremities.  Urinalysis today shows small hemoglobin (which has been present in 2022 in 2023), trace leukocytes and rare bacteria.  Will send urine for culture.  This is not a clear UTI and I do not think it is the cause of her symptoms.  EKG obtained today.  EKG shows sinus bradycardia with PACs.  KUB obtained to assess for possible renal stone.  KUB without any definite renal stones.  Patient is reporting " I feel really bad. I feel lightheaded."   Discussed with patient and daughter obtaining lab work to assess for electrolyte abnormality but explained given that she has already been admitted twice in the past couple of months for atrial fibrillation, dizziness and lightheadedness I believe going back to the emergency department is the best decision for her.  Explained that she would likely have labs  checked, possible IV fluids and continuous telemetry.  Explained that she is showing PACs at this time but she could be going into A-fib which is making her symptomatic.  Patient reports having a cardioversion other treatment to convert back to a normal rhythm.  She had a normal sinus rhythm 1 month ago.  Patient and daughter are agreeable to going to emergency department.  Discussed EMS transport but since her vital signs are stable her daughter feels comfortable taking her.  Daughter taking patient to [facility] ED at this time.  Patient is leaving in stable condition.   Final Clinical Impressions(s) / UC Diagnoses   Final diagnoses:  Dizziness  Lightheadedness  Bradycardia  Premature atrial complexes  Urinary frequency  Pre-syncope     Discharge Instructions      You have been advised to follow up immediately in the emergency department for concerning signs.symptoms. If you declined EMS transport, please have a family member take you directly to the ED at this time. Do not delay. Based on concerns about condition, if you do not follow up in th e ED, you may risk poor outcomes including worsening of condition, delayed treatment and potentially life threatening issues. If you have declined to go to the ED at this time, you should call your PCP immediately to set up a follow up appointment.  Go to ED for red flag symptoms, including; fevers you cannot reduce with Tylenol/Motrin, severe headaches, vision changes, numbness/weakness in part of the body, lethargy, confusion, intractable vomiting, severe dehydration, chest pain, breathing difficulty, severe persistent abdominal or pelvic pain, signs of severe infection (increased redness, swelling of an area), feeling faint or passing out, dizziness, etc. You should especially go to the ED for sudden acute worsening of condition if you do not elect to go at this time.      ED Prescriptions   None    PDMP not reviewed this encounter.   Shirlee Latch, PA-C 07/25/23 1001

## 2023-07-25 NOTE — ED Notes (Signed)
Patient ambulatory to bathroom with cane. 

## 2023-07-26 LAB — URINE CULTURE

## 2023-09-20 NOTE — Progress Notes (Unsigned)
Cardiology Office Note Date:  09/21/2023  Patient ID:  Maria, Trujillo 1934-10-02, MRN [id number] PCP:  Marina Goodell, MD  Cardiologist:  Marcina Millard, MD Electrophysiologist: Lanier Prude, MD    Chief Complaint: Maria Trujillo follow-up  History of Present Illness: Maria Trujillo is a 89 y.o. female with PMH notable for parox Afib, PAC, CAD, HTN; seen today for Lanier Prude, MD for routine electrophysiology followup.   She is s/p tikosyn load 7/29-05/28/23. While loading she converted to NSR. Her BB was stopped inpatient d/t bradycardia. She called afib clinic after her admission with concerns she had returned to afib, HR in the 80-100s. She was told to resume 1/2 dose metop (12.5mg ), and she did take 1-2 times but not daily for fear of bradycardia.  She was seen in Afib clinic 8/9 for routine post-tikosyn loading appt and was found to be in Afib. She was set up for DCCV at that appointment. She messaged clinic 8/13 with [employer], [place] reviewed and determined she was in NSR and cancelled DCCV.  She saw Dr. Darrold Junker 8/20 for routine cardiology follow-up, notes stated that she was maintaining NSR.  I saw her 05/2023 where she was maintaining sinus rhythm.   On follow-up today, she does not think she has had any AFib episodes since last appt. She continues to take tikosyn at 8:30a/8:30p using alarms. Continues to take eliquis BID without bleeding concerns. She has not needed her PRN metoprolol at all in recent months. She denies chest pain, chest pressure, SOB. She intermittently has increased lower extremity edema, has taken PRN lasix 2-3 times in recent months.    AAD History: Tikosyn   ROS:  Please see the history of present illness. All other systems are reviewed and otherwise negative.   PHYSICAL EXAM:  VS:  BP 120/60 (BP Location: Left Arm, Patient Position: Sitting, Cuff Size: Normal)   Pulse (!) 59   Ht 5\' 4"  (1.626 m)   Wt 165 lb 12.8 oz  (75.2 kg)   SpO2 98%   BMI 28.46 kg/m  BMI: Body mass index is 28.46 kg/m.  GEN- The patient is well appearing, alert and oriented x 3 today.   Lungs- Clear to ausculation bilaterally, normal work of breathing.  Heart- Regular rate and rhythm, no murmurs, rubs or gallops Extremities- Trace peripheral edema, warm, dry   EKG is ordered. Personal review of EKG from today shows:   EKG Interpretation Date/Time:  Tuesday September 21 2023 14:33:14 EST Ventricular Rate:  59 PR Interval:  194 QRS Duration:  88 QT Interval:  432 QTC Calculation: 427 R Axis:   -6  Text Interpretation: Sinus bradycardia Confirmed by Sherie Don [phone]) on 09/21/2023 2:34:40 PM     Recent Labs: 06/04/2023: Magnesium 2.0 07/25/2023: ALT 26; BUN 15; Creatinine, Ser 0.60; Hemoglobin 11.2; Platelets 142; Potassium 3.7; Sodium 141; TSH 0.992  No results found for requested labs within last 365 days.   CrCl cannot be calculated (Patient's most recent lab result is older than the maximum 21 days allowed.).   Wt Readings from Last 3 Encounters:  09/21/23 165 lb 12.8 oz (75.2 kg)  07/25/23 164 lb 14.5 oz (74.8 kg)  07/25/23 165 lb (74.8 kg)     Additional studies reviewed include: Previous EP, cardiology notes.   LHC, 11/04/2020 3rd RPL lesion is 100% stenosed. 1.  Non-ST elevation myocardial infarction 2.  Occluded small caliber RPL 3 3.  Normal left ventricular function  TTE, 11/03/2020  1. Left ventricular ejection fraction, by estimation, is 55 to 60%. The left ventricle has normal function. The left ventricle has no regional wall motion abnormalities. Left ventricular diastolic parameters were normal.   2. Right ventricular systolic function is normal. The right ventricular size is normal.   3. The mitral valve is normal in structure. Mild to moderate mitral valve regurgitation. No evidence of mitral stenosis.   4. Tricuspid valve regurgitation is mild to moderate.   5. The aortic valve is normal in  structure. Aortic valve regurgitation is not visualized. No aortic stenosis is present.   6. The inferior vena cava is normal in size with greater than 50% respiratory variability, suggesting right atrial pressure of 3 mmHg.   ASSESSMENT AND PLAN:  #) persis afib #) high risk medication - tikosyn EKG with stable QTC on [employer] will, has good schedule for medication adherence Reviewed medication list, no interactions noted Update BMP and mag today  #) Hypercoag d/t parox afib CHA2DS2-VASc Score = 6 [CHF History: 1, HTN History: 1, Diabetes History: 0, Stroke History: 0, Vascular Disease History: 1, Age Score: 2, Gender Score: 1].  Therefore, the patient's annual risk of stroke is 9.7 %.    Stroke ppx - 5mg  eliquis BID, appropriately dosed No bleeding concerns Update CBC  #) HTN At goal today.   Continue 5mg  lisinopril       Current medicines are reviewed at length with the patient today.   The patient does not have concerns regarding her medicines.  The following changes were made today:  none  Labs/ tests ordered today include:  Orders Placed This Encounter  Procedures   CBC   Magnesium   Basic metabolic panel   EKG 12-Lead     Disposition: Follow up with Dr. Lalla Brothers or EP APP in 6 months   Signed, Sherie Don, NP  09/21/23  3:20 PM  Electrophysiology [facility]

## 2023-09-21 ENCOUNTER — Ambulatory Visit: Payer: HMO | Attending: Cardiology | Admitting: Cardiology

## 2023-09-21 ENCOUNTER — Encounter: Payer: Self-pay | Admitting: Cardiology

## 2023-09-21 VITALS — BP 120/60 | HR 59 | Ht 64.0 in | Wt 165.8 lb

## 2023-09-21 DIAGNOSIS — D6869 Other thrombophilia: Secondary | ICD-10-CM

## 2023-09-21 DIAGNOSIS — Z79899 Other long term (current) drug therapy: Secondary | ICD-10-CM | POA: Diagnosis not present

## 2023-09-21 DIAGNOSIS — Z5181 Encounter for therapeutic drug level monitoring: Secondary | ICD-10-CM | POA: Diagnosis not present

## 2023-09-21 DIAGNOSIS — I4819 Other persistent atrial fibrillation: Secondary | ICD-10-CM | POA: Diagnosis not present

## 2023-09-21 NOTE — Patient Instructions (Signed)
Medication Instructions:  Your physician recommends that you continue on your current medications as directed. Please refer to the Current Medication list given to you today.   *If you need a refill on your cardiac medications before your next appointment, please call your pharmacy*   Lab Work: Your provider would like for you to have following labs drawn today (CBC, BMP. Mg).     Testing/Procedures: No test ordered today    Follow-Up: At [facility], you and your health needs are our priority.  As part of our continuing mission to provide you with exceptional heart care, we have created designated Provider Care Teams.  These Care Teams include your primary Cardiologist (physician) and Advanced Practice Providers (APPs -  Physician Assistants and Nurse Practitioners) who all work together to provide you with the care you need, when you need it.  We recommend signing up for the patient portal called "MyChart".  Sign up information is provided on this After Visit Summary.  MyChart is used to connect with patients for Virtual Visits (Telemedicine).  Patients are able to view lab/test results, encounter notes, upcoming appointments, etc.  Non-urgent messages can be sent to your provider as well.   To learn more about what you can do with MyChart, go to [email or web address].    Your next appointment:   6 month(s)  Provider:   Steffanie Dunn, MD or Sherie Don, NP

## 2023-09-22 LAB — BASIC METABOLIC PANEL
BUN/Creatinine Ratio: 21 (ref 12–28)
BUN: 13 mg/dL (ref 8–27)
CO2: 27 mmol/L (ref 20–29)
Calcium: 9.5 mg/dL (ref 8.7–10.3)
Chloride: 106 mmol/L (ref 96–106)
Creatinine, Ser: 0.63 mg/dL (ref 0.57–1.00)
Glucose: 103 mg/dL — ABNORMAL HIGH (ref 70–99)
Potassium: 4.7 mmol/L (ref 3.5–5.2)
Sodium: 143 mmol/L (ref 134–144)
eGFR: 86 mL/min/{1.73_m2} (ref >59)

## 2023-09-22 LAB — CBC
Hematocrit: 34.8 % (ref 34.0–46.6)
Hemoglobin: 11 g/dL — ABNORMAL LOW (ref 11.1–15.9)
MCH: 30.3 pg (ref 26.6–33.0)
MCHC: 31.6 g/dL (ref 31.5–35.7)
MCV: 96 fL (ref 79–97)
Platelets: 153 10*3/uL (ref 150–450)
RBC: 3.63 x10E6/uL — ABNORMAL LOW (ref 3.77–5.28)
RDW: 11.8 % (ref 11.7–15.4)
WBC: 5.2 10*3/uL (ref 3.4–10.8)

## 2023-09-22 LAB — MAGNESIUM: Magnesium: 2.1 mg/dL (ref 1.6–2.3)

## 2023-11-02 DIAGNOSIS — Z961 Presence of intraocular lens: Secondary | ICD-10-CM | POA: Diagnosis not present

## 2023-11-02 DIAGNOSIS — H510 Palsy (spasm) of conjugate gaze: Secondary | ICD-10-CM | POA: Diagnosis not present

## 2023-11-02 DIAGNOSIS — H353130 Nonexudative age-related macular degeneration, bilateral, stage unspecified: Secondary | ICD-10-CM | POA: Diagnosis not present

## 2023-11-02 DIAGNOSIS — H401133 Primary open-angle glaucoma, bilateral, severe stage: Secondary | ICD-10-CM | POA: Diagnosis not present

## 2023-11-25 DIAGNOSIS — R051 Acute cough: Secondary | ICD-10-CM | POA: Diagnosis not present

## 2023-11-25 DIAGNOSIS — J0110 Acute frontal sinusitis, unspecified: Secondary | ICD-10-CM | POA: Diagnosis not present

## 2023-11-27 ENCOUNTER — Emergency Department
Admission: EM | Admit: 2023-11-27 | Discharge: 2023-11-27 | Disposition: A | Discharge status: Home / Self Care | Payer: HMO | Attending: Emergency Medicine | Admitting: Emergency Medicine

## 2023-11-27 ENCOUNTER — Emergency Department: Payer: HMO

## 2023-11-27 ENCOUNTER — Other Ambulatory Visit: Payer: Self-pay

## 2023-11-27 DIAGNOSIS — R519 Headache, unspecified: Secondary | ICD-10-CM | POA: Insufficient documentation

## 2023-11-27 DIAGNOSIS — Z79899 Other long term (current) drug therapy: Secondary | ICD-10-CM | POA: Diagnosis not present

## 2023-11-27 DIAGNOSIS — Z7901 Long term (current) use of anticoagulants: Secondary | ICD-10-CM | POA: Insufficient documentation

## 2023-11-27 DIAGNOSIS — R55 Syncope and collapse: Secondary | ICD-10-CM | POA: Insufficient documentation

## 2023-11-27 DIAGNOSIS — I672 Cerebral atherosclerosis: Secondary | ICD-10-CM | POA: Diagnosis not present

## 2023-11-27 DIAGNOSIS — I4891 Unspecified atrial fibrillation: Secondary | ICD-10-CM | POA: Diagnosis not present

## 2023-11-27 DIAGNOSIS — R42 Dizziness and giddiness: Secondary | ICD-10-CM | POA: Diagnosis not present

## 2023-11-27 DIAGNOSIS — I499 Cardiac arrhythmia, unspecified: Secondary | ICD-10-CM | POA: Diagnosis not present

## 2023-11-27 DIAGNOSIS — Z20822 Contact with and (suspected) exposure to covid-19: Secondary | ICD-10-CM | POA: Insufficient documentation

## 2023-11-27 DIAGNOSIS — I959 Hypotension, unspecified: Secondary | ICD-10-CM | POA: Diagnosis not present

## 2023-11-27 LAB — URINALYSIS, ROUTINE W REFLEX MICROSCOPIC
Bilirubin Urine: NEGATIVE
Glucose, UA: NEGATIVE mg/dL
Hgb urine dipstick: NEGATIVE
Ketones, ur: 5 mg/dL — AB
Leukocytes,Ua: NEGATIVE
Nitrite: NEGATIVE
Protein, ur: NEGATIVE mg/dL
Specific Gravity, Urine: 1.018 (ref 1.005–1.030)
pH: 5 (ref 5.0–8.0)

## 2023-11-27 LAB — CBC WITH DIFFERENTIAL/PLATELET
Abs Immature Granulocytes: 0.01 10*3/uL (ref 0.00–0.07)
Basophils Absolute: 0 10*3/uL (ref 0.0–0.1)
Basophils Relative: 0 %
Eosinophils Absolute: 0.2 10*3/uL (ref 0.0–0.5)
Eosinophils Relative: 5 %
HCT: 32.8 % — ABNORMAL LOW (ref 36.0–46.0)
Hemoglobin: 10.6 g/dL — ABNORMAL LOW (ref 12.0–15.0)
Immature Granulocytes: 0 %
Lymphocytes Relative: 21 %
Lymphs Abs: 1 10*3/uL (ref 0.7–4.0)
MCH: 30.9 pg (ref 26.0–34.0)
MCHC: 32.3 g/dL (ref 30.0–36.0)
MCV: 95.6 fL (ref 80.0–100.0)
Monocytes Absolute: 0.4 10*3/uL (ref 0.1–1.0)
Monocytes Relative: 9 %
Neutro Abs: 3.1 10*3/uL (ref 1.7–7.7)
Neutrophils Relative %: 65 %
Platelets: 125 10*3/uL — ABNORMAL LOW (ref 150–400)
RBC: 3.43 MIL/uL — ABNORMAL LOW (ref 3.87–5.11)
RDW: 12.5 % (ref 11.5–15.5)
WBC: 4.8 10*3/uL (ref 4.0–10.5)
nRBC: 0 % (ref 0.0–0.2)

## 2023-11-27 LAB — LIPASE, BLOOD: Lipase: 33 U/L (ref 11–51)

## 2023-11-27 LAB — COMPREHENSIVE METABOLIC PANEL
ALT: 26 U/L (ref 0–44)
AST: 35 U/L (ref 15–41)
Albumin: 3.2 g/dL — ABNORMAL LOW (ref 3.5–5.0)
Alkaline Phosphatase: 95 U/L (ref 38–126)
Anion gap: 9 (ref 5–15)
BUN: 14 mg/dL (ref 8–23)
CO2: 25 mmol/L (ref 22–32)
Calcium: 8.6 mg/dL — ABNORMAL LOW (ref 8.9–10.3)
Chloride: 106 mmol/L (ref 98–111)
Creatinine, Ser: 0.5 mg/dL (ref 0.44–1.00)
GFR, Estimated: >60 mL/min (ref >60)
Glucose, Bld: 130 mg/dL — ABNORMAL HIGH (ref 70–99)
Potassium: 3.8 mmol/L (ref 3.5–5.1)
Sodium: 140 mmol/L (ref 135–145)
Total Bilirubin: 0.7 mg/dL (ref 0.0–1.2)
Total Protein: 5.8 g/dL — ABNORMAL LOW (ref 6.5–8.1)

## 2023-11-27 LAB — RESP PANEL BY RT-PCR (RSV, FLU A&B, COVID)  RVPGX2
Influenza A by PCR: NEGATIVE
Influenza B by PCR: NEGATIVE
Resp Syncytial Virus by PCR: NEGATIVE
SARS Coronavirus 2 by RT PCR: NEGATIVE

## 2023-11-27 LAB — TROPONIN I (HIGH SENSITIVITY)
Troponin I (High Sensitivity): 14 ng/L (ref <18)
Troponin I (High Sensitivity): 14 ng/L (ref <18)

## 2023-11-27 MED ORDER — SODIUM CHLORIDE 0.9 % IV BOLUS
500.0000 mL | Freq: Once | INTRAVENOUS | Status: AC
  Administered 2023-11-27: 500 mL via INTRAVENOUS

## 2023-11-27 MED ORDER — ONDANSETRON 4 MG PO TBDP: 4.0000 mg | ORAL_TABLET | Freq: Three times a day (TID) | ORAL | 0 refills | Status: DC | PRN

## 2023-11-27 NOTE — ED Triage Notes (Signed)
Pt to ED via [facility] from from home. Pt had syncopal episode. Pt had sinusitis and placed on doxy on Thursday. Pt had just eaten and felt faint and synopsized. Initial BP 79/42. 18g LAC. 1L NS. Pt vomited multiple times in route. 4mg  zofran.   Repeat BP 124/58 HR 62 94% RA  CBG 135

## 2023-11-27 NOTE — Discharge Instructions (Addendum)
Please call your cardiologist to make a follow-up appointment to discuss this episode but at this time I suspect this is more likely related to your sinusitis and some dehydration return to the ER for worsening symptoms or any other concerns

## 2023-11-27 NOTE — ED Provider Notes (Signed)
[facility] Provider Note    Event Date/Time   First MD Initiated Contact with Patient 11/27/23 1957     (approximate)   History   Loss of Consciousness   HPI  Maria Trujillo is a 89 y.o. female with history of A-fib on Tikosyn, Eliquis who comes in with concerns for LOC.  Patient reports that this morning she felt a little fluttering in her heart so she took her Tikosyn a little bit early.  After that she denies any issues with her heart.  She states that she was eating food and about 10 minutes later she had taken her doxycycline and started to feel a little off.  She states that she felt kind of lightheaded like she was going to pass out.  She then had a witnessed LOC episode where she slumped over in the chair but did not fall did not hit her head.  When EMS got there she did vomit a few times and her blood pressure was 79/42 and she received a bolus of fluids.  Patient reports feeling better now still has a little bit of fogginess.  She has taken doxycycline previously for sinus infections.  They are not sure if she just could have been a little bit dehydrated.  She denied any chest pain, shortness of breath.  No abdominal pain    Reviewed patient's echo from 2022 that was overall reassuring  Physical Exam   Triage Vital Signs: ED Triage Vitals  Encounter Vitals Group     BP 11/27/23 1501 112/62     Systolic BP Percentile --      Diastolic BP Percentile --      Pulse Rate 11/27/23 1501 71     Resp 11/27/23 1501 18     Temp 11/27/23 1503 (!) 97.5 F (36.4 C)     Temp Source 11/27/23 1503 Axillary     SpO2 11/27/23 1501 96 %     Weight 11/27/23 1502 164 lb (74.4 kg)     Height 11/27/23 1502 5\' 3"  (1.6 m)     Head Circumference --      Peak Flow --      Pain Score 11/27/23 1500 0     Pain Loc --      Pain Education --      Exclude from Growth Chart --     Most recent vital signs: Vitals:   11/27/23 1503 11/27/23 1819  BP:  132/65  Pulse:  72   Resp:  20  Temp: (!) 97.5 F (36.4 C)   SpO2:  100%     General: Awake, no distress.  CV:  Good peripheral perfusion.  Resp:  Normal effort.  Abd:  No distention.  Soft and nontender Other:  No swelling legs.  No calf tenderness Cranial nerves II through XII are intact.  Equal strength in arms and legs   ED Results / Procedures / Treatments   Labs (all labs ordered are listed, but only abnormal results are displayed) Labs Reviewed  CBC WITH DIFFERENTIAL/PLATELET - Abnormal; Notable for the following components:      Result Value   RBC 3.43 (*)    Hemoglobin 10.6 (*)    HCT 32.8 (*)    Platelets 125 (*)    All other components within normal limits  COMPREHENSIVE METABOLIC PANEL - Abnormal; Notable for the following components:   Glucose, Bld 130 (*)    Calcium 8.6 (*)    Total Protein 5.8 (*)  Albumin 3.2 (*)    All other components within normal limits  URINALYSIS, ROUTINE W REFLEX MICROSCOPIC - Abnormal; Notable for the following components:   Color, Urine YELLOW (*)    APPearance CLEAR (*)    Ketones, ur 5 (*)    All other components within normal limits  RESP PANEL BY RT-PCR (RSV, FLU A&B, COVID)  RVPGX2  LIPASE, BLOOD  TROPONIN I (HIGH SENSITIVITY)  TROPONIN I (HIGH SENSITIVITY)     EKG  My interpretation of EKG:  Sinus rate of 60 without any ST elevation or T wave inversions, normal intervals  RADIOLOGY I have reviewed the CT personally interpreted and no ICH    PROCEDURES:  Critical Care performed: No  Procedures   MEDICATIONS ORDERED IN ED: Medications  sodium chloride 0.9 % bolus 500 mL (500 mLs Intravenous New Bag/Given 11/27/23 2054)     IMPRESSION / MDM / ASSESSMENT AND PLAN / ED COURSE  I reviewed the triage vital signs and the nursing notes.   Patient's presentation is most consistent with acute presentation with potential threat to life or bodily function.   Patient comes in with concerns for syncopal episode.  Echocardiogram  recently was reassuring.  We discussed the possibility of cardiac causes although EKG is reassuring will get 2 troponins to evaluate.  This could be secondary to dehydration.  No abdominal pain to suggest AAA rupture.  No shortness of breath to suggest PE.  Initial troponin negative.  Hemoglobin slightly lower at 10.6 from baseline of 11.  She denies any black stools.  She will follow this up with her PCP.  CMP reassuring lipase normal urine does have some slight ketones in it.   IMPRESSION: 1. No evidence of acute intracranial abnormality. 2. Multifocal sclerotic and lytic appearance of the bone which could be related to multiple myeloma, metastatic disease, or metabolic. Similar findings on 2010 MRI head.    Orthostatics negative after fluids.  Patient reports feeling at her baseline self.  Troponin was negative x 2.  Reviewed cardiac monitoring no evidence of any arrhythmias.  We discussed admission versus going home.  This time I think is more likely from dehydration in combination of being treated for her sinusitis.  They are comfortable with discharge home.  They understand a small risk for cardiac causes that could lead to death.  But this seems less likely and the daughter is a Engineer, civil (consulting) and states that she can go home and monitor her at home.  Think this is very reasonable and patient will be discharged home.  We discussed the CT scan and this is already been worked up previously by her doctors back in 2010.   The patient is on the cardiac monitor to evaluate for evidence of arrhythmia and/or significant heart rate changes.      FINAL CLINICAL IMPRESSION(S) / ED DIAGNOSES   Final diagnoses:  Syncope, unspecified syncope type     Rx / DC Orders   ED Discharge Orders     None        Note:  This document was prepared using Dragon voice recognition software and may include unintentional dictation errors.   Concha Se, MD 11/27/23 2221

## 2023-11-27 NOTE — ED Provider Triage Note (Signed)
Emergency Medicine Provider Triage Evaluation Note  Maria Trujillo , a 89 y.o. female  was evaluated in triage.  Pt complains of syncope while she was eating.  She reports that she felt very lightheaded.  She reports that she still feels slightly dizzy now.  No paresthesias or weakness.  She feels slightly nauseated.  No chest pain.  No vomiting.  She reports that she recently started doxycycline for a sinus infection and has had loose stools.   Review of Systems  Positive: Weakness, syncope, diarrhea Negative: fever  Physical Exam  There were no vitals taken for this visit. Gen:   Awake, no distress   Resp:  Normal effort  MSK:   Moves extremities without difficulty  Other:    Medical Decision Making  Medically screening exam initiated at 2:56 PM.  Appropriate orders placed.  Elouise Munroe was informed that the remainder of the evaluation will be completed by another provider, this initial triage assessment does not replace that evaluation, and the importance of remaining in the ED until their evaluation is complete.     Jackelyn Hoehn, PA-C 11/27/23 1505

## 2023-11-27 NOTE — ED Triage Notes (Signed)
See first nurse note, pt from home [facility] for syncopal episode around 1330 while sitting and eating. Was feeling dizzy at the time. Received 1L IV fluid with EMS, initial BP was 79/42 then was 124/58 after bolus. On doxycycline for sinus infx.   No complaints at this time. Did not hit head (family member was there, did not fall). Skin dry. VSS.

## 2023-11-27 NOTE — ED Notes (Signed)
RN to bedside to introduce self to pt. Pt is caox4, in no acute distress. Family at bedside.

## 2023-11-30 DIAGNOSIS — D649 Anemia, unspecified: Secondary | ICD-10-CM | POA: Diagnosis not present

## 2023-11-30 DIAGNOSIS — K5900 Constipation, unspecified: Secondary | ICD-10-CM | POA: Diagnosis not present

## 2023-11-30 DIAGNOSIS — I10 Essential (primary) hypertension: Secondary | ICD-10-CM | POA: Diagnosis not present

## 2023-11-30 DIAGNOSIS — R42 Dizziness and giddiness: Secondary | ICD-10-CM | POA: Diagnosis not present

## 2023-11-30 DIAGNOSIS — I480 Paroxysmal atrial fibrillation: Secondary | ICD-10-CM | POA: Diagnosis not present

## 2023-12-01 ENCOUNTER — Ambulatory Visit: Payer: HMO | Attending: Cardiology | Admitting: Cardiology

## 2023-12-01 VITALS — BP 161/74 | HR 57 | Ht 64.0 in | Wt 168.4 lb

## 2023-12-01 DIAGNOSIS — Z5181 Encounter for therapeutic drug level monitoring: Secondary | ICD-10-CM | POA: Diagnosis not present

## 2023-12-01 DIAGNOSIS — D6869 Other thrombophilia: Secondary | ICD-10-CM

## 2023-12-01 DIAGNOSIS — I4819 Other persistent atrial fibrillation: Secondary | ICD-10-CM | POA: Diagnosis not present

## 2023-12-01 DIAGNOSIS — Z79899 Other long term (current) drug therapy: Secondary | ICD-10-CM

## 2023-12-01 DIAGNOSIS — I10 Essential (primary) hypertension: Secondary | ICD-10-CM | POA: Diagnosis not present

## 2023-12-01 NOTE — Progress Notes (Signed)
 Cardiology Office Note Date:  12/01/2023  Patient ID:  Maria Trujillo, Maria Trujillo [birth year], MRN [id number] PCP:  Jeffie Cheryl BRAVO, MD  Cardiologist:  Marsa Dooms, MD Electrophysiologist: OLE ONEIDA HOLTS, MD    Chief Complaint: ER follow-up  History of Present Illness: Maria Trujillo is a 89 y.o. female with PMH notable for parox Afib, PAC, CAD, HTN; seen today for OLE ONEIDA HOLTS, MD for ER follow up.   She is s/p tikosyn  load 7/29-05/28/23. While loading she converted to NSR. Her BB was stopped inpatient d/t bradycardia. Quickly after tikosyn  loading, she did have paroxysms of afib.   I saw her 05/2023 and 08/2023 where she was maintaining sinus rhythm at both appts.   She went to ER 11/27/23 after an episode of witnessed syncope. Called EMS where BP was 79/42. She received IVF and BP increased. She has been battled a sinus infection and is on doxycycline . K 3.8, Cr stable, resp panel negative. She saw PCP who updated labs and recommend stopping doxy.   On follow-up today, she continues to take tikosyn  BID without missing doses. She was rx zofran  at discharge from ER, but has not taken any doses.  Her daughter joins for visit, whom I have met before. She is concerned the doxy is causing nausea and thus decreased PO intake.   Patient has had two episodes of palpitations since last being seen, took PRN lopressor  with resolution of symptoms.  Takes eliquis  BID, no bleeding concerns.    AAD History: Tikosyn  125mcg   ROS:  Please see the history of present illness. All other systems are reviewed and otherwise negative.   PHYSICAL EXAM:  VS:  BP (!) 161/74 (BP Location: Left Arm, Patient Position: Sitting, Cuff Size: Normal)   Pulse (!) 57   Ht 5' 4 (1.626 m)   Wt 168 lb 6.4 oz (76.4 kg)   BMI 28.91 kg/m  BMI: Body mass index is 28.91 kg/m.  Wt Readings from Last 3 Encounters:  12/01/23 168 lb 6.4 oz (76.4 kg)  11/27/23 164 lb (74.4 kg)  09/21/23 165 lb 12.8 oz (75.2 kg)     GEN- The patient is well appearing, alert and oriented x 3 today.   Lungs- Clear to ausculation bilaterally, normal work of breathing.  Heart- Regular rate and rhythm, no murmurs, rubs or gallops Extremities- Trace peripheral edema, warm, dry   EKG is ordered. Personal review of EKG from today shows:   EKG Interpretation Date/Time:  Wednesday December 01 2023 13:41:18 EST Ventricular Rate:  57 PR Interval:  192 QRS Duration:  88 QT Interval:  438 QTC Calculation: 426 R Axis:   39  Text Interpretation: Sinus bradycardia When compared with ECG of 27-Nov-2023 15:03, No significant change was found Confirmed by Jackqulyn Mendel [phone]) on 12/01/2023 1:45:04 PM    2/1 EKG - SR at 60bpm, QT/QTC  09/21/2023 EKG - SB at 59bpm; QTC 427   Recent Labs: 07/25/2023: TSH 0.992 09/21/2023: Magnesium  2.1 11/27/2023: ALT 26; BUN 14; Creatinine, Ser 0.50; Hemoglobin 10.6; Platelets 125; Potassium 3.8; Sodium 140  No results found for requested labs within last 365 days.   Estimated Creatinine Clearance: 49.6 mL/min (by C-G formula based on SCr of 0.5 mg/dL).   Additional studies reviewed include: Previous EP, cardiology notes.   LHC, 11/04/2020 3rd RPL lesion is 100% stenosed. 1.  Non-ST elevation myocardial infarction 2.  Occluded small caliber RPL 3 3.  Normal left ventricular function  TTE, 11/03/2020  1. Left  ventricular ejection fraction, by estimation, is 55 to 60%. The left ventricle has normal function. The left ventricle has no regional wall motion abnormalities. Left ventricular diastolic parameters were normal.   2. Right ventricular systolic function is normal. The right ventricular size is normal.   3. The mitral valve is normal in structure. Mild to moderate mitral valve regurgitation. No evidence of mitral stenosis.   4. Tricuspid valve regurgitation is mild to moderate.   5. The aortic valve is normal in structure. Aortic valve regurgitation is not visualized. No aortic  stenosis is present.   6. The inferior vena cava is normal in size with greater than 50% respiratory variability, suggesting right atrial pressure of 3 mmHg.   ASSESSMENT AND PLAN:  #) persis afib #) high risk medication - tikosyn  EKG with stable QTC on tikosyn  Tolerating tikosyn  will, has good schedule for medication adherence I do not believe tikosyn  is the cause of her recent hypotension and syncope Stop zofran  d/t interaction with tikosyn  Recent BMP stable in ER and at PCP's office   #) Hypercoag d/t parox afib CHA2DS2-VASc Score = 6 [CHF History: 1, HTN History: 1, Diabetes History: 0, Stroke History: 0, Vascular Disease History: 1, Age Score: 2, Gender Score: 1].  Therefore, the patient's annual risk of stroke is 9.7 %.    Stroke ppx - 5mg  eliquis  BID, appropriately dosed No bleeding concerns Stable CBC  #) HTN #) elevated BP today  Elevated in office today, but stable by home readings Continue 5mg  lisinopril  Continue to check BP 2-3 times per week and record measurements       Current medicines are reviewed at length with the patient today.   The patient does not have concerns regarding her medicines.  The following changes were made today:   STOP zofran   Labs/ tests ordered today include:  Orders Placed This Encounter  Procedures   EKG 12-Lead     Disposition: Follow up with Dr. Cindie or EP APP  3-4 months    Signed, Chantal Needle, NP  12/01/23  2:12 PM  Electrophysiology [facility]

## 2023-12-01 NOTE — Patient Instructions (Addendum)
 Medication Instructions:  The current medical regimen is effective;  continue present plan and medications.  *If you need a refill on your cardiac medications before your next appointment, please call your pharmacy*   Follow-Up: At [facility], you and your health needs are our priority.  As part of our continuing mission to provide you  with exceptional heart care, we have created designated Provider Care Teams.  These Care Teams include your primary Cardiologist (physician) and Advanced Practice Providers (APPs -  Physician Assistants and Nurse Practitioners) who all work together to provide you with the care you need, when you need it.  We recommend signing up for the patient portal called MyChart.  Sign up information is provided on this After Visit Summary.  MyChart is used to connect with patients for Virtual Visits (Telemedicine).  Patients are able to view lab/test results, encounter notes, upcoming appointments, etc.  Non-urgent messages can be sent to your provider as well.   To learn more about what you can do with MyChart, go to [email or web address].    Your next appointment:   May 2025   Provider:   Suzann Riddle, NP

## 2023-12-21 DIAGNOSIS — I10 Essential (primary) hypertension: Secondary | ICD-10-CM | POA: Diagnosis not present

## 2023-12-21 DIAGNOSIS — E7800 Pure hypercholesterolemia, unspecified: Secondary | ICD-10-CM | POA: Diagnosis not present

## 2023-12-21 DIAGNOSIS — E039 Hypothyroidism, unspecified: Secondary | ICD-10-CM | POA: Diagnosis not present

## 2023-12-21 DIAGNOSIS — I480 Paroxysmal atrial fibrillation: Secondary | ICD-10-CM | POA: Diagnosis not present

## 2023-12-21 DIAGNOSIS — I2510 Atherosclerotic heart disease of native coronary artery without angina pectoris: Secondary | ICD-10-CM | POA: Diagnosis not present

## 2024-01-20 DIAGNOSIS — E039 Hypothyroidism, unspecified: Secondary | ICD-10-CM | POA: Diagnosis not present

## 2024-01-20 DIAGNOSIS — I480 Paroxysmal atrial fibrillation: Secondary | ICD-10-CM | POA: Diagnosis not present

## 2024-01-20 DIAGNOSIS — K219 Gastro-esophageal reflux disease without esophagitis: Secondary | ICD-10-CM | POA: Diagnosis not present

## 2024-01-20 DIAGNOSIS — E7800 Pure hypercholesterolemia, unspecified: Secondary | ICD-10-CM | POA: Diagnosis not present

## 2024-01-20 DIAGNOSIS — Z0000 Encounter for general adult medical examination without abnormal findings: Secondary | ICD-10-CM | POA: Diagnosis not present

## 2024-01-20 DIAGNOSIS — I10 Essential (primary) hypertension: Secondary | ICD-10-CM | POA: Diagnosis not present

## 2024-01-20 DIAGNOSIS — I2510 Atherosclerotic heart disease of native coronary artery without angina pectoris: Secondary | ICD-10-CM | POA: Diagnosis not present

## 2024-01-20 DIAGNOSIS — J301 Allergic rhinitis due to pollen: Secondary | ICD-10-CM | POA: Diagnosis not present

## 2024-02-22 DIAGNOSIS — Z96653 Presence of artificial knee joint, bilateral: Secondary | ICD-10-CM | POA: Diagnosis not present

## 2024-02-29 ENCOUNTER — Ambulatory Visit: Payer: HMO | Attending: Cardiology | Admitting: Cardiology

## 2024-02-29 ENCOUNTER — Encounter: Payer: Self-pay | Admitting: Cardiology

## 2024-02-29 VITALS — BP 126/60 | HR 72 | Ht 64.0 in | Wt 168.2 lb

## 2024-02-29 DIAGNOSIS — D6869 Other thrombophilia: Secondary | ICD-10-CM | POA: Diagnosis not present

## 2024-02-29 DIAGNOSIS — Z5181 Encounter for therapeutic drug level monitoring: Secondary | ICD-10-CM

## 2024-02-29 DIAGNOSIS — I4819 Other persistent atrial fibrillation: Secondary | ICD-10-CM | POA: Diagnosis not present

## 2024-02-29 DIAGNOSIS — Z79899 Other long term (current) drug therapy: Secondary | ICD-10-CM | POA: Diagnosis not present

## 2024-02-29 NOTE — Patient Instructions (Signed)
 Medication Instructions:  The current medical regimen is effective;  continue present plan and medications as directed. Please refer to the Current Medication list given to you today.   *If you need a refill on your cardiac medications before your next appointment, please call your pharmacy*  Lab Work: Your provider would like for you to have following labs drawn today BMET, MAG.   If you have labs (blood work) drawn today and your tests are completely normal, you will receive your results only by: MyChart Message (if you have MyChart) OR A paper copy in the mail If you have any lab test that is abnormal or we need to change your treatment, we will call you to review the results.  Follow-Up: At [facility], you and your health needs are our priority.  As part of our continuing mission to provide you with exceptional heart care, our providers are all part of one team.  This team includes your primary Cardiologist (physician) and Advanced Practice Providers or APPs (Physician Assistants and Nurse Practitioners) who all work together to provide you with the care you need, when you need it.  Your next appointment:   4 month(s)  Provider:   Harvie Liner, MD or Suzann Riddle, NP    We recommend signing up for the patient portal called "MyChart".  Sign up information is provided on this After Visit Summary.  MyChart is used to connect with patients for Virtual Visits (Telemedicine).  Patients are able to view lab/test results, encounter notes, upcoming appointments, etc.  Non-urgent messages can be sent to your provider as well.   To learn more about what you can do with MyChart, go to [email or web address].

## 2024-02-29 NOTE — Progress Notes (Signed)
 Cardiology Office Note Date:  02/29/2024  Patient ID:  Trujillo, Maria 1934/02/09, MRN [id number] PCP:  Lorrie Rothman, MD  Cardiologist:  Percival Brace, MD Electrophysiologist: Boyce Byes, MD    Chief Complaint: AFib follow-up  History of Present Illness: Maria Trujillo is a 89 y.o. female with PMH notable for parox Afib, PAC, CAD, HTN; seen today for Boyce Byes, MD for routine follow up.   She is s/p tikosyn  load 7/29-05/28/23. While loading she converted to NSR. Her BB was stopped inpatient d/t bradycardia. Quickly after tikosyn  loading, she did have paroxysms of afib.   I saw her 05/2023, 08/2023, and 11/2023 where she was maintaining sinus rhythm at both appts.   On follow-up today, she does note an episode of AFib about a week ago. She took a PRN metoprolol  where HR lowered and at some point the episode stopped. She estimates that the episode lasted about 1.5 hours total.   She continues to take tikosyn  and eliquis  BID without missing doses. She has no bleeding concerns on eliquis .   Her daughter joins for visit, whom I have met before.     AAD History: Tikosyn  125mcg   ROS:  Please see the history of present illness. All other systems are reviewed and otherwise negative.   PHYSICAL EXAM:  VS:  BP 126/60   Pulse 72   Ht 5\' 4"  (1.626 m)   Wt 168 lb 3.2 oz (76.3 kg)   SpO2 99%   BMI 28.87 kg/m  BMI: Body mass index is 28.87 kg/m.  Wt Readings from Last 3 Encounters:  02/29/24 168 lb 3.2 oz (76.3 kg)  12/01/23 168 lb 6.4 oz (76.4 kg)  11/27/23 164 lb (74.4 kg)    GEN- The patient is well appearing, alert and oriented x 3 today.   Lungs- Clear to ausculation bilaterally, normal work of breathing.  Heart- Regular rate and rhythm, no murmurs, rubs or gallops Extremities- Trace peripheral edema, warm, dry   EKG is ordered. Personal review of EKG from today shows:   EKG Interpretation Date/Time:  Tuesday Feb 29 2024 14:38:47 EDT Ventricular  Rate:  62 PR Interval:  194 QRS Duration:  78 QT Interval:  420 QTC Calculation: 426 R Axis:   33  Text Interpretation: Normal sinus rhythm Confirmed by Airen Stiehl [phone]) on 02/29/2024 2:40:27 PM     12/01/2023 EKG - SB at 57bpm; QT 438 / QTC      Additional studies reviewed include: Previous EP, cardiology notes.   LHC, 11/04/2020 3rd RPL lesion is 100% stenosed. 1.  Non-ST elevation myocardial infarction 2.  Occluded small caliber RPL 3 3.  Normal left ventricular function  TTE, 11/03/2020  1. Left ventricular ejection fraction, by estimation, is 55 to 60%. The left ventricle has normal function. The left ventricle has no regional wall motion abnormalities. Left ventricular diastolic parameters were normal.   2. Right ventricular systolic function is normal. The right ventricular size is normal.   3. The mitral valve is normal in structure. Mild to moderate mitral valve regurgitation. No evidence of mitral stenosis.   4. Tricuspid valve regurgitation is mild to moderate.   5. The aortic valve is normal in structure. Aortic valve regurgitation is not visualized. No aortic stenosis is present.   6. The inferior vena cava is normal in size with greater than 50% respiratory variability, suggesting right atrial pressure of 3 mmHg.   ASSESSMENT AND PLAN:  #) persis afib #)  high risk medication - tikosyn  Rare AFib episodes EKG with stable QTC on tikosyn  Continue PRN 12.5mg  lopressor  for AFib episodes Update BMP, Mag  #) Hypercoag d/t parox afib CHA2DS2-VASc Score = 6 [CHF History: 1, HTN History: 1, Diabetes History: 0, Stroke History: 0, Vascular Disease History: 1, Age Score: 2, Gender Score: 1].  Therefore, the patient's annual risk of stroke is 9.7 %.    Stroke ppx - 5mg  eliquis  BID, appropriately dosed No bleeding concerns       Current medicines are reviewed at length with the patient today.   The patient does not have concerns regarding her medicines.   The following changes were made today:   None   Labs/ tests ordered today include:  Orders Placed This Encounter  Procedures   Basic metabolic panel with GFR   Magnesium    EKG 12-Lead     Disposition: Follow up with Dr. Marven Slimmer or EP APP  in 4 months    Signed, Iokepa Geffre, NP  02/29/24  3:30 PM  Electrophysiology [facility]

## 2024-03-01 LAB — BASIC METABOLIC PANEL WITH GFR
BUN/Creatinine Ratio: 22 (ref 12–28)
BUN: 16 mg/dL (ref 8–27)
CO2: 27 mmol/L (ref 20–29)
Calcium: 9.5 mg/dL (ref 8.7–10.3)
Chloride: 106 mmol/L (ref 96–106)
Creatinine, Ser: 0.74 mg/dL (ref 0.57–1.00)
Glucose: 98 mg/dL (ref 70–99)
Potassium: 4.6 mmol/L (ref 3.5–5.2)
Sodium: 144 mmol/L (ref 134–144)
eGFR: 78 mL/min/{1.73_m2} (ref >59)

## 2024-03-01 LAB — MAGNESIUM: Magnesium: 2 mg/dL (ref 1.6–2.3)

## 2024-03-07 DIAGNOSIS — I4891 Unspecified atrial fibrillation: Secondary | ICD-10-CM | POA: Diagnosis not present

## 2024-03-07 DIAGNOSIS — K219 Gastro-esophageal reflux disease without esophagitis: Secondary | ICD-10-CM | POA: Diagnosis not present

## 2024-03-07 DIAGNOSIS — R110 Nausea: Secondary | ICD-10-CM | POA: Diagnosis not present

## 2024-03-07 DIAGNOSIS — J3489 Other specified disorders of nose and nasal sinuses: Secondary | ICD-10-CM | POA: Diagnosis not present

## 2024-03-07 DIAGNOSIS — K5900 Constipation, unspecified: Secondary | ICD-10-CM | POA: Diagnosis not present

## 2024-03-07 DIAGNOSIS — R432 Parageusia: Secondary | ICD-10-CM | POA: Diagnosis not present

## 2024-03-17 ENCOUNTER — Other Ambulatory Visit: Payer: Self-pay | Admitting: Cardiology

## 2024-03-29 DIAGNOSIS — R3589 Other polyuria: Secondary | ICD-10-CM | POA: Diagnosis not present

## 2024-03-29 DIAGNOSIS — R110 Nausea: Secondary | ICD-10-CM | POA: Diagnosis not present

## 2024-03-29 DIAGNOSIS — I4891 Unspecified atrial fibrillation: Secondary | ICD-10-CM | POA: Diagnosis not present

## 2024-03-29 DIAGNOSIS — I10 Essential (primary) hypertension: Secondary | ICD-10-CM | POA: Diagnosis not present

## 2024-03-29 DIAGNOSIS — R432 Parageusia: Secondary | ICD-10-CM | POA: Diagnosis not present

## 2024-03-29 DIAGNOSIS — E039 Hypothyroidism, unspecified: Secondary | ICD-10-CM | POA: Diagnosis not present

## 2024-03-29 DIAGNOSIS — R42 Dizziness and giddiness: Secondary | ICD-10-CM | POA: Diagnosis not present

## 2024-04-07 ENCOUNTER — Other Ambulatory Visit: Payer: Self-pay

## 2024-04-07 ENCOUNTER — Emergency Department
Admission: EM | Admit: 2024-04-07 | Discharge: 2024-04-07 | Disposition: A | Discharge status: Home / Self Care | Attending: Emergency Medicine | Admitting: Emergency Medicine

## 2024-04-07 DIAGNOSIS — E860 Dehydration: Secondary | ICD-10-CM | POA: Insufficient documentation

## 2024-04-07 DIAGNOSIS — R000 Tachycardia, unspecified: Secondary | ICD-10-CM | POA: Diagnosis present

## 2024-04-07 DIAGNOSIS — I480 Paroxysmal atrial fibrillation: Secondary | ICD-10-CM | POA: Diagnosis not present

## 2024-04-07 LAB — CBC
HCT: 36.4 % (ref 36.0–46.0)
Hemoglobin: 11.8 g/dL — ABNORMAL LOW (ref 12.0–15.0)
MCH: 30.3 pg (ref 26.0–34.0)
MCHC: 32.4 g/dL (ref 30.0–36.0)
MCV: 93.6 fL (ref 80.0–100.0)
Platelets: 150 10*3/uL (ref 150–400)
RBC: 3.89 MIL/uL (ref 3.87–5.11)
RDW: 12.7 % (ref 11.5–15.5)
WBC: 5.1 10*3/uL (ref 4.0–10.5)
nRBC: 0 % (ref 0.0–0.2)

## 2024-04-07 LAB — BASIC METABOLIC PANEL WITH GFR
Anion gap: 9 (ref 5–15)
BUN: 11 mg/dL (ref 8–23)
CO2: 27 mmol/L (ref 22–32)
Calcium: 9.4 mg/dL (ref 8.9–10.3)
Chloride: 106 mmol/L (ref 98–111)
Creatinine, Ser: 0.52 mg/dL (ref 0.44–1.00)
GFR, Estimated: >60 mL/min (ref >60)
Glucose, Bld: 114 mg/dL — ABNORMAL HIGH (ref 70–99)
Potassium: 3.7 mmol/L (ref 3.5–5.1)
Sodium: 142 mmol/L (ref 135–145)

## 2024-04-07 LAB — TROPONIN I (HIGH SENSITIVITY)
Troponin I (High Sensitivity): 20 ng/L — ABNORMAL HIGH (ref <18)
Troponin I (High Sensitivity): 17 ng/L (ref <18)

## 2024-04-07 MED ORDER — SODIUM CHLORIDE 0.9 % IV SOLN: Freq: Once | INTRAVENOUS | Status: AC

## 2024-04-07 NOTE — ED Triage Notes (Signed)
 Pt states going in and out of A-fib, pt states she takes Ticacine daily and metoprolol  PRN for when I am in a-fib. Metoprolol  taken today at 0930.Aaron Aas Pt denies pain. NAD noted.

## 2024-04-07 NOTE — ED Provider Notes (Signed)
 [facility] Provider Note    Event Date/Time   First MD Initiated Contact with Patient 04/07/24 1421     (approximate)   History   Atrial Fibrillation   HPI  Maria Trujillo is a 89 y.o. female with a history of paroxysmal atrial fibrillation on Tikosyn  and as needed metoprolol  who reports early this morning she woke up with her heart beating rapidly, she took her medications as typical and this improved.  However she feels very thirsty and thinks that she may need fluids.  She denies chest pain.  No nausea or vomiting.     Physical Exam   Triage Vital Signs: ED Triage Vitals  Encounter Vitals Group     BP 04/07/24 1303 (!) 153/74     Girls Systolic BP Percentile --      Girls Diastolic BP Percentile --      Boys Systolic BP Percentile --      Boys Diastolic BP Percentile --      Pulse Rate 04/07/24 1303 72     Resp 04/07/24 1303 18     Temp 04/07/24 1305 97.9 F (36.6 C)     Temp Source 04/07/24 1305 Oral     SpO2 04/07/24 1303 100 %     Weight 04/07/24 1304 72.6 kg (160 lb)     Height 04/07/24 1304 1.626 m (5' 4)     Head Circumference --      Peak Flow --      Pain Score 04/07/24 1303 0     Pain Loc --      Pain Education --      Exclude from Growth Chart --     Most recent vital signs: Vitals:   04/07/24 1303 04/07/24 1305  BP: (!) 153/74   Pulse: 72   Resp: 18   Temp:  97.9 F (36.6 C)  SpO2: 100%      General: Awake, no distress.  CV:  Good peripheral perfusion.  Regular rate and rhythm Resp:  Normal effort.  Abd:  No distention.  Other:     ED Results / Procedures / Treatments   Labs (all labs ordered are listed, but only abnormal results are displayed) Labs Reviewed  BASIC METABOLIC PANEL WITH GFR - Abnormal; Notable for the following components:      Result Value   Glucose, Bld 114 (*)    All other components within normal limits  CBC - Abnormal; Notable for the following components:   Hemoglobin 11.8 (*)     All other components within normal limits  TROPONIN I (HIGH SENSITIVITY) - Abnormal; Notable for the following components:   Troponin I (High Sensitivity) 20 (*)    All other components within normal limits  TROPONIN I (HIGH SENSITIVITY)     EKG  ED ECG REPORT I, Bryson Carbine, the attending physician, personally viewed and interpreted this ECG.  Date: 04/07/2024  Rhythm: normal sinus rhythm QRS Axis: normal Intervals: normal ST/T Wave abnormalities: normal Narrative Interpretation: no evidence of acute ischemia    RADIOLOGY     PROCEDURES:  Critical Care performed:   Procedures   MEDICATIONS ORDERED IN ED: Medications  0.9 %  sodium chloride  infusion ( Intravenous New Bag/Given 04/07/24 1447)     IMPRESSION / MDM / ASSESSMENT AND PLAN / ED COURSE  I reviewed the triage vital signs and the nursing notes. Patient's presentation is most consistent with severe exacerbation of chronic illness.  Patient with known history of  paroxysmal atrial fibrillation on Tikosyn , as needed metoprolol  presents today after an episode of atrial fibrillation now resolved.  Overall she feels somewhat weak and dehydrated.  She has felt thirsty all night long.  This may be what caused her to go into atrial fibrillation.  No evidence of atrial fibrillation here, confirmed on EKG.  No chest pain reported  Will treat with IV fluids, obtain second troponin, first enzyme of 20, this is likely chronic for her.  Have asked my colleague to reevaluate patient after fluids, anticipate discharge        FINAL CLINICAL IMPRESSION(S) / ED DIAGNOSES   Final diagnoses:  Paroxysmal atrial fibrillation (HCC)  Dehydration     Rx / DC Orders   ED Discharge Orders     None        Note:  This document was prepared using Dragon voice recognition software and may include unintentional dictation errors.   Bryson Carbine, MD 04/07/24 [phone]

## 2024-04-07 NOTE — ED Provider Notes (Addendum)
-----------------------------------------   4:25 PM on 04/07/2024 ----------------------------------------- Patient's repeat heart enzyme is reassuring.  Remains in what appears to be a normal sinus rhythm.  Feels better after fluids.  I spoke to the patient and daughter they will follow-up with her cardiologist they have an appointment scheduled in approximately 2 weeks to have a primary care appointment scheduled on Wednesday.  Provided return precautions.   Ruth Cove, MD 04/07/24 1625    Ruth Cove, MD 04/07/24 1626

## 2024-04-12 DIAGNOSIS — J301 Allergic rhinitis due to pollen: Secondary | ICD-10-CM | POA: Diagnosis not present

## 2024-04-12 DIAGNOSIS — Z1331 Encounter for screening for depression: Secondary | ICD-10-CM | POA: Diagnosis not present

## 2024-04-12 DIAGNOSIS — K219 Gastro-esophageal reflux disease without esophagitis: Secondary | ICD-10-CM | POA: Diagnosis not present

## 2024-04-18 DIAGNOSIS — R001 Bradycardia, unspecified: Secondary | ICD-10-CM | POA: Diagnosis not present

## 2024-04-18 DIAGNOSIS — I214 Non-ST elevation (NSTEMI) myocardial infarction: Secondary | ICD-10-CM | POA: Diagnosis not present

## 2024-04-18 DIAGNOSIS — E7800 Pure hypercholesterolemia, unspecified: Secondary | ICD-10-CM | POA: Diagnosis not present

## 2024-04-18 DIAGNOSIS — I491 Atrial premature depolarization: Secondary | ICD-10-CM | POA: Diagnosis not present

## 2024-04-18 DIAGNOSIS — R002 Palpitations: Secondary | ICD-10-CM | POA: Diagnosis not present

## 2024-04-18 DIAGNOSIS — I10 Essential (primary) hypertension: Secondary | ICD-10-CM | POA: Diagnosis not present

## 2024-04-18 DIAGNOSIS — I2510 Atherosclerotic heart disease of native coronary artery without angina pectoris: Secondary | ICD-10-CM | POA: Diagnosis not present

## 2024-04-18 DIAGNOSIS — I4891 Unspecified atrial fibrillation: Secondary | ICD-10-CM | POA: Diagnosis not present

## 2024-05-02 DIAGNOSIS — H401133 Primary open-angle glaucoma, bilateral, severe stage: Secondary | ICD-10-CM | POA: Diagnosis not present

## 2024-05-02 DIAGNOSIS — H510 Palsy (spasm) of conjugate gaze: Secondary | ICD-10-CM | POA: Diagnosis not present

## 2024-05-02 DIAGNOSIS — Z961 Presence of intraocular lens: Secondary | ICD-10-CM | POA: Diagnosis not present

## 2024-05-02 DIAGNOSIS — T859XXA Unspecified complication of internal prosthetic device, implant and graft, initial encounter: Secondary | ICD-10-CM | POA: Diagnosis not present

## 2024-05-02 DIAGNOSIS — H3530 Unspecified macular degeneration: Secondary | ICD-10-CM | POA: Diagnosis not present

## 2024-05-12 DIAGNOSIS — D225 Melanocytic nevi of trunk: Secondary | ICD-10-CM | POA: Diagnosis not present

## 2024-05-12 DIAGNOSIS — D2271 Melanocytic nevi of right lower limb, including hip: Secondary | ICD-10-CM | POA: Diagnosis not present

## 2024-05-12 DIAGNOSIS — Z08 Encounter for follow-up examination after completed treatment for malignant neoplasm: Secondary | ICD-10-CM | POA: Diagnosis not present

## 2024-05-12 DIAGNOSIS — L821 Other seborrheic keratosis: Secondary | ICD-10-CM | POA: Diagnosis not present

## 2024-05-12 DIAGNOSIS — D2261 Melanocytic nevi of right upper limb, including shoulder: Secondary | ICD-10-CM | POA: Diagnosis not present

## 2024-05-12 DIAGNOSIS — R58 Hemorrhage, not elsewhere classified: Secondary | ICD-10-CM | POA: Diagnosis not present

## 2024-05-12 DIAGNOSIS — Z85828 Personal history of other malignant neoplasm of skin: Secondary | ICD-10-CM | POA: Diagnosis not present

## 2024-05-12 DIAGNOSIS — D2262 Melanocytic nevi of left upper limb, including shoulder: Secondary | ICD-10-CM | POA: Diagnosis not present

## 2024-05-12 DIAGNOSIS — L820 Inflamed seborrheic keratosis: Secondary | ICD-10-CM | POA: Diagnosis not present

## 2024-05-12 DIAGNOSIS — D2272 Melanocytic nevi of left lower limb, including hip: Secondary | ICD-10-CM | POA: Diagnosis not present

## 2024-05-23 ENCOUNTER — Emergency Department

## 2024-05-23 ENCOUNTER — Observation Stay
Admission: EM | Admit: 2024-05-23 | Discharge: 2024-05-24 | Disposition: A | Discharge status: Home / Self Care | Attending: Hospitalist | Admitting: Hospitalist

## 2024-05-23 DIAGNOSIS — Z96653 Presence of artificial knee joint, bilateral: Secondary | ICD-10-CM | POA: Insufficient documentation

## 2024-05-23 DIAGNOSIS — I10 Essential (primary) hypertension: Secondary | ICD-10-CM | POA: Insufficient documentation

## 2024-05-23 DIAGNOSIS — R002 Palpitations: Secondary | ICD-10-CM

## 2024-05-23 DIAGNOSIS — I4891 Unspecified atrial fibrillation: Principal | ICD-10-CM | POA: Diagnosis present

## 2024-05-23 DIAGNOSIS — Z79899 Other long term (current) drug therapy: Secondary | ICD-10-CM | POA: Insufficient documentation

## 2024-05-23 DIAGNOSIS — Z7982 Long term (current) use of aspirin: Secondary | ICD-10-CM | POA: Insufficient documentation

## 2024-05-23 DIAGNOSIS — I2510 Atherosclerotic heart disease of native coronary artery without angina pectoris: Secondary | ICD-10-CM | POA: Insufficient documentation

## 2024-05-23 DIAGNOSIS — R55 Syncope and collapse: Secondary | ICD-10-CM | POA: Insufficient documentation

## 2024-05-23 DIAGNOSIS — E785 Hyperlipidemia, unspecified: Secondary | ICD-10-CM | POA: Diagnosis not present

## 2024-05-23 DIAGNOSIS — R001 Bradycardia, unspecified: Secondary | ICD-10-CM | POA: Diagnosis not present

## 2024-05-23 DIAGNOSIS — Z79890 Hormone replacement therapy (postmenopausal): Secondary | ICD-10-CM | POA: Insufficient documentation

## 2024-05-23 DIAGNOSIS — R7989 Other specified abnormal findings of blood chemistry: Secondary | ICD-10-CM | POA: Insufficient documentation

## 2024-05-23 DIAGNOSIS — Z7901 Long term (current) use of anticoagulants: Secondary | ICD-10-CM | POA: Insufficient documentation

## 2024-05-23 DIAGNOSIS — Z9861 Coronary angioplasty status: Secondary | ICD-10-CM | POA: Diagnosis not present

## 2024-05-23 DIAGNOSIS — E039 Hypothyroidism, unspecified: Secondary | ICD-10-CM | POA: Insufficient documentation

## 2024-05-23 DIAGNOSIS — I480 Paroxysmal atrial fibrillation: Principal | ICD-10-CM | POA: Insufficient documentation

## 2024-05-23 DIAGNOSIS — I252 Old myocardial infarction: Secondary | ICD-10-CM | POA: Insufficient documentation

## 2024-05-23 DIAGNOSIS — H401133 Primary open-angle glaucoma, bilateral, severe stage: Secondary | ICD-10-CM | POA: Insufficient documentation

## 2024-05-23 HISTORY — DX: Unspecified atrial fibrillation: I48.91

## 2024-05-23 LAB — BASIC METABOLIC PANEL WITH GFR
Anion gap: 11 (ref 5–15)
BUN: 11 mg/dL (ref 8–23)
CO2: 24 mmol/L (ref 22–32)
Calcium: 9.6 mg/dL (ref 8.9–10.3)
Chloride: 106 mmol/L (ref 98–111)
Creatinine, Ser: 0.68 mg/dL (ref 0.44–1.00)
GFR, Estimated: >60 mL/min (ref >60)
Glucose, Bld: 102 mg/dL — ABNORMAL HIGH (ref 70–99)
Potassium: 4.1 mmol/L (ref 3.5–5.1)
Sodium: 141 mmol/L (ref 135–145)

## 2024-05-23 LAB — HEPATIC FUNCTION PANEL
ALT: 19 U/L (ref 0–44)
AST: 29 U/L (ref 15–41)
Albumin: 3.7 g/dL (ref 3.5–5.0)
Alkaline Phosphatase: 88 U/L (ref 38–126)
Bilirubin, Direct: 0.1 mg/dL (ref 0.0–0.2)
Indirect Bilirubin: 0.8 mg/dL (ref 0.3–0.9)
Total Bilirubin: 0.9 mg/dL (ref 0.0–1.2)
Total Protein: 6.5 g/dL (ref 6.5–8.1)

## 2024-05-23 LAB — CBC
HCT: 37.7 % (ref 36.0–46.0)
Hemoglobin: 12.2 g/dL (ref 12.0–15.0)
MCH: 30.3 pg (ref 26.0–34.0)
MCHC: 32.4 g/dL (ref 30.0–36.0)
MCV: 93.8 fL (ref 80.0–100.0)
Platelets: 149 K/uL — ABNORMAL LOW (ref 150–400)
RBC: 4.02 MIL/uL (ref 3.87–5.11)
RDW: 13.1 % (ref 11.5–15.5)
WBC: 6 K/uL (ref 4.0–10.5)
nRBC: 0 % (ref 0.0–0.2)

## 2024-05-23 LAB — TROPONIN I (HIGH SENSITIVITY)
Troponin I (High Sensitivity): 16 ng/L (ref <18)
Troponin I (High Sensitivity): 16 ng/L (ref <18)

## 2024-05-23 LAB — TSH: TSH: 1.678 u[IU]/mL (ref 0.350–4.500)

## 2024-05-23 LAB — MAGNESIUM: Magnesium: 2 mg/dL (ref 1.7–2.4)

## 2024-05-23 LAB — BRAIN NATRIURETIC PEPTIDE: B Natriuretic Peptide: 720.2 pg/mL — ABNORMAL HIGH (ref 0.0–100.0)

## 2024-05-23 MED ORDER — AMIODARONE HCL IN DEXTROSE 360-4.14 MG/200ML-% IV SOLN
60.0000 mg/h | INTRAVENOUS | Status: AC
  Administered 2024-05-23 (×2): 60 mg/h via INTRAVENOUS
  Filled 2024-05-23: qty 200

## 2024-05-23 MED ORDER — ONDANSETRON HCL 4 MG/2ML IJ SOLN
4.0000 mg | Freq: Four times a day (QID) | INTRAMUSCULAR | Status: DC | PRN
Start: 2024-05-23 — End: 2024-05-24

## 2024-05-23 MED ORDER — POLYETHYLENE GLYCOL 3350 17 G PO PACK: 17.0000 g | PACK | Freq: Every day | ORAL | Status: DC | PRN

## 2024-05-23 MED ORDER — METOPROLOL TARTRATE 5 MG/5ML IV SOLN
5.0000 mg | Freq: Once | INTRAVENOUS | Status: AC
  Administered 2024-05-23: 5 mg via INTRAVENOUS
  Filled 2024-05-23: qty 5

## 2024-05-23 MED ORDER — ATORVASTATIN CALCIUM 20 MG PO TABS
40.0000 mg | ORAL_TABLET | Freq: Every day | ORAL | Status: DC
  Administered 2024-05-24: 40 mg via ORAL
  Filled 2024-05-23: qty 2

## 2024-05-23 MED ORDER — SODIUM CHLORIDE 0.9 % IV BOLUS
500.0000 mL | Freq: Once | INTRAVENOUS | Status: AC
  Administered 2024-05-23: 500 mL via INTRAVENOUS

## 2024-05-23 MED ORDER — ONDANSETRON HCL 4 MG PO TABS: 4.0000 mg | ORAL_TABLET | Freq: Four times a day (QID) | ORAL | Status: DC | PRN

## 2024-05-23 MED ORDER — PANTOPRAZOLE SODIUM 40 MG PO TBEC
40.0000 mg | DELAYED_RELEASE_TABLET | Freq: Every day | ORAL | Status: DC
  Administered 2024-05-23 – 2024-05-24 (×2): 40 mg via ORAL
  Filled 2024-05-23 (×2): qty 1

## 2024-05-23 MED ORDER — MELATONIN 5 MG PO TABS: 5.0000 mg | ORAL_TABLET | Freq: Every evening | ORAL | Status: DC | PRN

## 2024-05-23 MED ORDER — ISOSORBIDE MONONITRATE ER 60 MG PO TB24
30.0000 mg | ORAL_TABLET | Freq: Every day | ORAL | Status: DC
  Administered 2024-05-24: 30 mg via ORAL
  Filled 2024-05-23: qty 1

## 2024-05-23 MED ORDER — AMIODARONE HCL IN DEXTROSE 360-4.14 MG/200ML-% IV SOLN
30.0000 mg/h | INTRAVENOUS | Status: DC
  Administered 2024-05-23 – 2024-05-24 (×2): 30 mg/h via INTRAVENOUS
  Filled 2024-05-23 (×2): qty 200

## 2024-05-23 MED ORDER — DILTIAZEM HCL-DEXTROSE 125-5 MG/125ML-% IV SOLN (PREMIX)
5.0000 mg/h | INTRAVENOUS | Status: DC
  Administered 2024-05-23: 5 mg/h via INTRAVENOUS
  Filled 2024-05-23: qty 125

## 2024-05-23 MED ORDER — ACETAMINOPHEN 325 MG PO TABS: 650.0000 mg | ORAL_TABLET | Freq: Four times a day (QID) | ORAL | Status: DC | PRN

## 2024-05-23 MED ORDER — ACETAMINOPHEN 650 MG RE SUPP: 650.0000 mg | Freq: Four times a day (QID) | RECTAL | Status: DC | PRN

## 2024-05-23 MED ORDER — AMIODARONE LOAD VIA INFUSION
150.0000 mg | Freq: Once | INTRAVENOUS | Status: AC
  Administered 2024-05-23: 150 mg via INTRAVENOUS
  Filled 2024-05-23: qty 83.34

## 2024-05-23 MED ORDER — LISINOPRIL 10 MG PO TABS
5.0000 mg | ORAL_TABLET | Freq: Every day | ORAL | Status: DC
  Administered 2024-05-24: 5 mg via ORAL
  Filled 2024-05-23: qty 1

## 2024-05-23 MED ORDER — APIXABAN 5 MG PO TABS
5.0000 mg | ORAL_TABLET | Freq: Two times a day (BID) | ORAL | Status: DC
  Administered 2024-05-23 – 2024-05-24 (×2): 5 mg via ORAL
  Filled 2024-05-23 (×2): qty 1

## 2024-05-23 MED ORDER — LEVOTHYROXINE SODIUM 100 MCG PO TABS
100.0000 ug | ORAL_TABLET | Freq: Every day | ORAL | Status: DC
  Administered 2024-05-24: 100 ug via ORAL
  Filled 2024-05-23: qty 1

## 2024-05-23 NOTE — ED Notes (Signed)
 CCMD called to place patient on the monitor.

## 2024-05-23 NOTE — ED Notes (Signed)
 Ccmd notified of cardiac monitoring

## 2024-05-23 NOTE — H&P (Addendum)
 History and Physical    Maria Trujillo FMW:[id number] DOB: December 24, 1933 DOA: 05/23/2024  DOS: the patient was seen and examined on 05/23/2024  PCP: Jeffie Cheryl BRAVO, MD   Patient coming from: Home  I have personally briefly reviewed patient's old medical records in [facility] and CareEverywhere  HPI:   Maria Trujillo is a 89 y.o. year old female with past medical history of atrial fibrillation on Tikosyn  and Eliquis , hypertension, hyperlipidemia, coronary artery disease, hypothyroidism, restless leg syndrome, and glaucoma.  She presents to [facility] ED with reports of palpitations and increasing weakness that has been ongoing for approximately 3-4 weeks.  Patient reports early on she was able to take metoprolol  and it relieved the palpitations however she notes that now the palpitations continue despite the metoprolol .  She denies chest pain, dyspnea, nausea, vomiting, diarrhea.  She endorses dizziness/lightheadedness, and reports she has blacked out for a split-second while sitting at home.  Denies any falls.  ED Course: On arrival to [facility] regional ED patient was noted to be afebrile temp 36.4 C, BP 129/99, HR 126, RR 16, SpO2 100% on room air.  Labs notable for vitals platelets 149, BNP 720, and troponin negative x 2.  CXR obtained and negative for any acute abnormality.  She was given 500 mL normal saline bolus, and 5 mg IV metoprolol , and started on a Cardizem  drip. [facility] cardiology consulted by EDP and will see patient. [facility] contacted for admission.  Review of Systems: As mentioned in the history of present illness. All other systems reviewed and are negative.  Review of Systems  Constitutional:  Negative for chills and fever.  Eyes:  Negative for blurred vision and double vision.  Respiratory:  Negative for cough and shortness of breath.   Cardiovascular:  Positive for palpitations. Negative for chest pain, leg swelling and PND.  Gastrointestinal:  Negative for  abdominal pain, diarrhea, nausea and vomiting.  Neurological:  Positive for dizziness. Negative for focal weakness and headaches.  All other systems reviewed and are negative.   Past Medical History:  Diagnosis Date   Arthritis    Atrial fibrillation (HCC)    History of kidney stones    Hypothyroidism    Myocardial infarct (HCC)    PONV (postoperative nausea and vomiting)     Past Surgical History:  Procedure Laterality Date   BREAST EXCISIONAL BIOPSY Bilateral 1984   benign   CARDIOVERSION N/A 03/04/2023   Procedure: CARDIOVERSION;  Surgeon: Ammon Blunt, MD;  Location: [facility] ORS;  Service: Cardiovascular;  Laterality: N/A;   CATARACT EXTRACTION, BILATERAL     CHOLECYSTECTOMY     EYE SURGERY     JOINT REPLACEMENT Left 12/2012   knee   KNEE ARTHROPLASTY Right 02/15/2017   Procedure: COMPUTER ASSISTED TOTAL KNEE ARTHROPLASTY;  Surgeon: Lynwood SHAUNNA Hue, MD;  Location: [facility] ORS;  Service: Orthopedics;  Laterality: Right;   LEFT HEART CATH AND CORONARY ANGIOGRAPHY N/A 11/04/2020   Procedure: LEFT HEART CATH AND CORONARY ANGIOGRAPHY;  Surgeon: Ammon Blunt, MD;  Location: [facility] INVASIVE CV LAB;  Service: Cardiovascular;  Laterality: N/A;   PHOTOCOAGULATION WITH LASER Right 04/20/2018   Procedure: TRANSCLERAL DIODE CYCLOPHOTOCOAGULATION  RIGHT per [place] block;  Surgeon: Mittie Gaskin, MD;  Location: [facility];  Service: Ophthalmology;  Laterality: Right;   ROTATOR CUFF REPAIR Left 2010   TONSILLECTOMY       reports that she has never smoked. She has never used smokeless tobacco. She reports that she does not drink  alcohol  and does not use drugs.  Allergies  Allergen Reactions   Celecoxib Hives   Dorzolamide Shortness Of Breath    Other Reaction(s): Other (See Comments)  Pt stated had reactions but unable to provide information   Penicillins Other (See Comments), Hives and Rash    Redness (sunburn type rash with peeling)  Has patient had a PCN  reaction causing immediate rash, facial/tongue/throat swelling, SOB or lightheadedness with hypotension: No  Has patient had a PCN reaction causing severe rash involving mucus membranes or skin necrosis: No  Has patient had a PCN reaction that required hospitalization No  Has patient had a PCN reaction occurring within the last 10 years: Yes  If all of the above answers are NO, then may proceed with Cephalosporin use.  Other Reaction(s): Other (See Comments)  Redness (sunburn type rash with peeling)  Has patient had a PCN reaction causing immediate rash, facial/tongue/throat swelling, SOB or lightheadedness with hypotension: No  Has patient had a PCN reaction causing severe rash involving mucus membranes or skin necrosis: No  Has patient had a PCN reaction that required hospitalization No  Has patient had a PCN reaction occurring within the last 10 years: Yes  If all of the above answers are NO, then may proceed with Cephalosporin use.  Redness and flushing  Redness and flushing  Redness (sunburn type rash with peeling)   Shellfish Allergy Hives and Itching   Timolol  Maleate Other (See Comments) and Shortness Of Breath    Redness  Other Reaction(s): Other (See Comments)  Shortness of breath  Redness  Redness  Higher Doses  Shortness of breath    Redness   Sulfa Antibiotics Other (See Comments)    Redness in eyes   Amoxicillin Rash   Amoxicillin-Pot Clavulanate Other (See Comments)    Redness and flushing   Brimonidine Other (See Comments)    blisters   Cefuroxime Axetil Other (See Comments)    redness   Dipivefrin Other (See Comments)    sleepiness   Oxycodone      BP dropped  Other Reaction(s): Other (See Comments)  Dropped BP down too low  Dropped blood pressure   BP dropped  Dropped BP down too low    Family History  Problem Relation Age of Onset   Breast cancer Sister 69    Prior to Admission medications   Medication Sig Start Date End Date  Taking? Authorizing Provider  acetaminophen  (TYLENOL ) 650 MG CR tablet Take 650 mg by mouth daily as needed for pain.    [provider]  apixaban  (ELIQUIS ) 5 MG TABS tablet Take 1 tablet (5 mg total) by mouth 2 (two) times daily. 07/16/23 02/29/24  Riddle, Suzann, NP  aspirin  81 MG tablet Take 81 mg by mouth at bedtime.    [provider]  atorvastatin  (LIPITOR ) 40 MG tablet Take 40 mg by mouth daily.    [provider]  azelastine  (ASTELIN ) 0.1 % nasal spray Place 1 spray into both nostrils as needed. 11/13/21   [provider]  bimatoprost (LUMIGAN) 0.03 % ophthalmic solution Place 1 drop into the right eye at bedtime.    [provider]  Calcium  Carb-Cholecalciferol  (CALCIUM  CARBONATE-VITAMIN D3) 600-400 MG-UNIT TABS Take 1 tablet by mouth daily.    [provider]  cholecalciferol  (VITAMIN D ) 1000 units tablet Take 1,000 Units by mouth daily.    [provider]  dofetilide  (TIKOSYN ) 125 MCG capsule TAKE 1 CAPSULE BY MOUTH TWICE DAILY 03/17/24   Riddle, Suzann,  NP  fexofenadine (ALLEGRA) 180 MG tablet 180 mg daily as needed for allergies.    [provider]  furosemide  (LASIX ) 20 MG tablet Take 20 mg by mouth daily. prn 01/04/23 02/29/24  [provider]  hydrocortisone valerate cream (WESTCORT) 0.2 % Apply 1 Application topically as needed. 03/24/23   [provider]  isosorbide  mononitrate (IMDUR ) 30 MG 24 hr tablet Take 1 tablet (30 mg total) by mouth daily. 11/06/20 02/29/24  Awanda City, MD  levothyroxine  (SYNTHROID , LEVOTHROID) 100 MCG tablet Take 100 mcg by mouth daily before breakfast.    [provider]  lisinopril  (ZESTRIL ) 5 MG tablet Take 1 tablet (5 mg total) by mouth daily. 11/06/20 02/29/24  Awanda City, MD  metoprolol  tartrate (LOPRESSOR ) 25 MG tablet Take 12.5 mg by mouth. 12.5 mg orally daily for hr over 100    [provider]  Multiple Vitamin (MULTIVITAMIN WITH MINERALS) TABS tablet Take  1 tablet by mouth daily.    [provider]  Multiple Vitamins-Minerals (PRESERVISION AREDS 2 PO) Take 1 tablet by mouth 2 (two) times daily.    [provider]  polyethylene glycol powder (GLYCOLAX /MIRALAX ) 17 GM/SCOOP powder Take by mouth. 07/30/23   [provider]  timolol  (TIMOPTIC ) 0.5 % ophthalmic solution Place 1 drop into the right eye in the morning. 10/28/20   [provider]    Physical Exam: Vitals:   05/23/24 1445 05/23/24 1453 05/23/24 1530 05/23/24 1559  BP:  (!) 138/92 132/81   Pulse: (!) 133  (!) 115 (!) 106  Resp:   17   Temp:      TempSrc:      SpO2:   99%   Weight:      Height:        Physical Exam Vitals and nursing note reviewed.  Constitutional:      Comments: Elderly Caucasian lady lying on ED stretcher  HENT:     Head: Normocephalic.  Eyes:     Pupils: Pupils are equal, round, and reactive to light.  Cardiovascular:     Rate and Rhythm: Tachycardia present. Rhythm irregular.     Pulses: Normal pulses.     Heart sounds: No murmur heard.    No friction rub. No gallop.  Pulmonary:     Effort: Pulmonary effort is normal.     Breath sounds: Normal breath sounds. No wheezing, rhonchi or rales.  Abdominal:     Palpations: Abdomen is soft.     Tenderness: There is no abdominal tenderness. There is no guarding.  Musculoskeletal:     Right lower leg: No edema.     Left lower leg: No edema.  Skin:    General: Skin is warm and dry.     Capillary Refill: Capillary refill takes less than 2 seconds.  Neurological:     General: No focal deficit present.     Mental Status: She is alert and oriented to person, place, and time.  Psychiatric:        Mood and Affect: Mood normal.     Labs on Admission: I have personally reviewed following labs and imaging studies  CBC: Recent Labs  Lab 05/23/24 1302  WBC 6.0  HGB 12.2  HCT 37.7  MCV 93.8  PLT 149*   Basic Metabolic Panel: Recent Labs  Lab 05/23/24 1302  NA 141   K 4.1  CL 106  CO2 24  GLUCOSE 102*  BUN 11  CREATININE 0.68  CALCIUM  9.6  MG 2.0  GFR: Estimated Creatinine Clearance: 47.5 mL/min (by C-G formula based on SCr of 0.68 mg/dL). Liver Function Tests: No results for input(s): AST, ALT, ALKPHOS, BILITOT, PROT, ALBUMIN in the last 168 hours. No results for input(s): LIPASE, AMYLASE in the last 168 hours. No results for input(s): AMMONIA in the last 168 hours. Coagulation Profile: No results for input(s): INR, PROTIME in the last 168 hours. Cardiac Enzymes: Recent Labs  Lab 05/23/24 1302 05/23/24 1435  TROPONINIHS 16 16   BNP (last 3 results) Recent Labs    05/23/24 1302  BNP 720.2*   HbA1C: No results for input(s): HGBA1C in the last 72 hours. CBG: No results for input(s): GLUCAP in the last 168 hours. Lipid Profile: No results for input(s): CHOL, HDL, LDLCALC, TRIG, CHOLHDL, LDLDIRECT in the last 72 hours. Thyroid  Function Tests: No results for input(s): TSH, T4TOTAL, FREET4, T3FREE, THYROIDAB in the last 72 hours. Anemia Panel: No results for input(s): VITAMINB12, FOLATE, FERRITIN, TIBC, IRON, RETICCTPCT in the last 72 hours. Urine analysis:    Component Value Date/Time   COLORURINE YELLOW (A) 11/27/2023 1505   APPEARANCEUR CLEAR (A) 11/27/2023 1505   APPEARANCEUR Clear 12/12/2012 1557   LABSPEC 1.018 11/27/2023 1505   LABSPEC 1.013 12/12/2012 1557   PHURINE 5.0 11/27/2023 1505   GLUCOSEU NEGATIVE 11/27/2023 1505   GLUCOSEU Negative 12/12/2012 1557   HGBUR NEGATIVE 11/27/2023 1505   BILIRUBINUR NEGATIVE 11/27/2023 1505   BILIRUBINUR Negative 12/12/2012 1557   KETONESUR 5 (A) 11/27/2023 1505   PROTEINUR NEGATIVE 11/27/2023 1505   NITRITE NEGATIVE 11/27/2023 1505   LEUKOCYTESUR NEGATIVE 11/27/2023 1505   LEUKOCYTESUR Trace 12/12/2012 1557    Radiological Exams on Admission: I have personally reviewed images DG Chest Port 1 View Result Date:  05/23/2024 CLINICAL DATA:  Cardiac palpitations weakness for several weeks. EXAM: PORTABLE CHEST 1 VIEW COMPARISON:  November 18, 2022. FINDINGS: Stable cardiomediastinal silhouette. No definite acute pulmonary abnormality is noted. Degenerative changes are seen involving both shoulders. IMPRESSION: No active disease. Electronically Signed   By: Lynwood Landy Raddle M.D.   On: 05/23/2024 13:53    EKG: My personal interpretation of EKG shows: Atrial fibrillation with rapid ventricular response, HR 126 with PVCs noted   Assessment/Plan Principal Problem:   Atrial fibrillation with rapid ventricular response [facility])    Active medical issues: ##Atrial Fibrillation with Rapid Ventricular Response Outpatient Meds: Tikosyn , metoprolol , and Eliquis  - Continue diltiazem  drip - Continue home Eliquis   - Tikosyn  and metoprolol  per cardiology - Repeat ECHO, last was 10/2020 and showed LVEF 55 to 60% with mild to moderate tricuspid regurgitation - Goal K>4, Mg >2 - TSH with AM labs - Cardiology consulted, appreciate their recommendations  ##Elevated BNP Patient euvolemic on exam, no crackles noted on auscultation of lungs, no pulmonary edema seen on CXR Elevation in setting of A-fib with RVR secondary to atrial wall stress and prolonged increased A-fib burden over the last 3 to 4 weeks  ##Syncope Events: Patient reports blacking out for a split-second while seated on the couch.  Denies any falls, denies head trauma. Endorses: Dizziness/lightheadedness, palpitations  Neuro findings: No neuro deficits noted on exam - Orthostatic vital signs  - ECHO - Cardiac Monitoring - Troponin 16-->16 - Neuro checks - PT/OT eval and treat - Cardiology consulted, appreciate their recommendations  Chronic medical issues: #Hypertension  - Continue home Imdur  and lisinopril   #Coronary artery disease #Hyperlipidemia  -Continue home atorvastatin   #Hypothyroidism - Continue home Synthroid   #Glaucoma - Continue  home eyedrops drops once med  rec complete  VTE prophylaxis:  Eliquis   GI prophylaxis: Protonix  Diet: Heart healthy Access: PIV Lines: None Code Status:  Full Code Telemetry: Yes  Admission status: Inpatient, Progressive Patient is from: Home Anticipated d/c is to: Home Anticipated d/c date is: 2 to 3 days Patient currently: On IV Cardizem , pending cardiology consultation, pending symptomatic improvement  Family Communication: Daughter [name] ([facility] RN) called at [phone] and updated.   Consults called: [facility] cardiology called by EDP   Severity of Illness: The appropriate patient status for this patient is INPATIENT. Inpatient status is judged to be reasonable and necessary in order to provide the required intensity of service to ensure the patient's safety. The patient's presenting symptoms, physical exam findings, and initial radiographic and laboratory data in the context of their chronic comorbidities is felt to place them at high risk for further clinical deterioration. Furthermore, it is not anticipated that the patient will be medically stable for discharge from the hospital within 2 midnights of admission.   * I certify that at the point of admission it is my clinical judgment that the patient will require inpatient hospital care spanning beyond 2 midnights from the point of admission due to high intensity of service, high risk for further deterioration and high frequency of surveillance required.*  To reach the provider On-Call:   7AM- 7PM see care teams to locate the attending and reach out to them via [email or web address]. Password: [email or web address] 7PM-7AM contact night-coverage If you still have difficulty reaching the appropriate provider, please page the [facility] (Director on Call) for [facility] on amion for assistance  This document was prepared using Conservation officer, historic buildings and may include unintentional dictation errors.  Rockie Rams FNP-BC, PMHNP-BC Nurse  Practitioner [facility]

## 2024-05-23 NOTE — ED Provider Notes (Signed)
 [facility] Provider Note    Event Date/Time   First MD Initiated Contact with Patient 05/23/24 1306     (approximate)   History   Palpitations   HPI  Maria Trujillo is a 89 y.o. female with a past medical history of atrial fibrillation on Tikosyn , hypertension followed by cardiology who presents to the emergency department with 2 weeks of intermittent palpitations that have become constant starting this morning.  Patient states that despite taking her regular medications as prescribed, she has had episodes of intermittent palpitations every other day for the past 2 weeks.  She denies any shortness of breath or chest pain or lightheadedness.  Today the palpitations awoke her from sleep at 1 AM and have been constant.  She took an extra dose of her metoprolol  without resolve.  Denies any fevers or chills cough congestion abdominal pain changes in urinary or bowel habits.  Patient's niece is beside her who helps contribute to the history      Physical Exam   Triage Vital Signs: ED Triage Vitals  Encounter Vitals Group     BP 05/23/24 1258 (!) 129/99     Girls Systolic BP Percentile --      Girls Diastolic BP Percentile --      Boys Systolic BP Percentile --      Boys Diastolic BP Percentile --      Pulse Rate 05/23/24 1258 (!) 40     Resp 05/23/24 1258 16     Temp 05/23/24 1258 97.6 F (36.4 C)     Temp Source 05/23/24 1258 Oral     SpO2 05/23/24 1258 100 %     Weight 05/23/24 1259 160 lb (72.6 kg)     Height 05/23/24 1259 5' 4 (1.626 m)     Head Circumference --      Peak Flow --      Pain Score 05/23/24 1259 0     Pain Loc --      Pain Education --      Exclude from Growth Chart --     Most recent vital signs: Vitals:   05/23/24 1453 05/23/24 1530  BP: (!) 138/92 132/81  Pulse:  (!) 115  Resp:  17  Temp:    SpO2:  99%    Nursing Triage Note reviewed. Vital signs reviewed and patients oxygen saturation is normoxic  General: Patient is  well nourished, well developed, awake and alert, resting comfortably in no acute distress Head: Normocephalic and atraumatic Eyes: Normal inspection, extraocular muscles intact, no conjunctival pallor Ear, nose, throat: Normal external exam Neck: Normal range of motion Respiratory: Patient is in no respiratory distress, lungs with mild rales Cardiovascular: Patient is tachycardic, irregular irregular rate GI: Abd SNT with no guarding or rebound  Back: Normal inspection of the back with good strength and range of motion throughout all ext Extremities: pulses intact with good cap refills, no LE pitting edema or calf tenderness Neuro: The patient is alert and oriented to person, place, and time, appropriately conversive, with 5/5 bilat UE/LE strength, no gross motor or sensory defects noted. Coordination appears to be adequate. Skin: Warm, dry, and intact Psych: normal mood and affect, no SI or HI  ED Results / Procedures / Treatments   Labs (all labs ordered are listed, but only abnormal results are displayed) Labs Reviewed  BASIC METABOLIC PANEL WITH GFR - Abnormal; Notable for the following components:      Result Value   Glucose,  Bld 102 (*)    All other components within normal limits  CBC - Abnormal; Notable for the following components:   Platelets 149 (*)    All other components within normal limits  BRAIN NATRIURETIC PEPTIDE - Abnormal; Notable for the following components:   B Natriuretic Peptide 720.2 (*)    All other components within normal limits  MAGNESIUM   TROPONIN I (HIGH SENSITIVITY)  TROPONIN I (HIGH SENSITIVITY)     EKG EKG and rhythm strip are interpreted by myself:   EKG: Atrial fibrillation at heart rate of 126, normal QRS duration, QTc 443 nonspecific ST segments and T waves, PVCs EKG not consistent with Acute STEMI Rhythm strip: afib with rvr in lead II   RADIOLOGY XR chest without acute abnormality on my independent review and  interpretation    PROCEDURES:  Critical Care performed: Yes, see critical care procedure note(s)  .Critical Care  Performed by: Nicholaus Rolland BRAVO, MD Authorized by: Nicholaus Rolland BRAVO, MD   Critical care provider statement:    Critical care time (minutes):  32   Critical care was necessary to treat or prevent imminent or life-threatening deterioration of the following conditions:  Cardiac failure   Critical care was time spent personally by me on the following activities:  Development of treatment plan with patient or surrogate, discussions with consultants, evaluation of patient's response to treatment, examination of patient, ordering and review of laboratory studies, ordering and review of radiographic studies, ordering and performing treatments and interventions, pulse oximetry, re-evaluation of patient's condition and review of old charts   Care discussed with: admitting provider   Comments:     Atrial fibrillation with rapid ventricular rate requiring push dose metoprolol  reassessment, cardiology consult and diltiazem  drip    MEDICATIONS ORDERED IN ED: Medications  diltiazem  (CARDIZEM ) 125 mg in dextrose  5% 125 mL (1 mg/mL) infusion (10 mg/hr Intravenous Rate/Dose Change 05/23/24 1543)  acetaminophen  (TYLENOL ) tablet 650 mg (has no administration in time range)    Or  acetaminophen  (TYLENOL ) suppository 650 mg (has no administration in time range)  polyethylene glycol (MIRALAX  / GLYCOLAX ) packet 17 g (has no administration in time range)  ondansetron  (ZOFRAN ) tablet 4 mg (has no administration in time range)    Or  ondansetron  (ZOFRAN ) injection 4 mg (has no administration in time range)  melatonin tablet 5 mg (has no administration in time range)  pantoprazole  (PROTONIX ) EC tablet 40 mg (has no administration in time range)  sodium chloride  0.9 % bolus 500 mL (0 mLs Intravenous Stopped 05/23/24 1400)  metoprolol  tartrate (LOPRESSOR ) injection 5 mg (5 mg Intravenous Given 05/23/24  1406)     IMPRESSION / MDM / ASSESSMENT AND PLAN / ED COURSE                                Differential diagnosis includes, but is not limited to, arrhythmia, ACS, electrolyte derangement, thyroid  abnormality, CHF  ED course: Patient arrives and she is in atrial fibrillation with rapid ventricular rate.  Likely her blood pressure is not hypotensive but in the systolic of 100s.  She was given a small bolus of fluid.  She does not have an elevated troponin but her BNP is slightly more elevated than previous.  Given that she was already on metoprolol  we did give a push dose of 5 mg which did not help the patient's rate but did temporarily drop the patient's blood pressure.  Case was discussed  with cardiologist on-call and he would not recommend amiodarone  with Tikosyn  but states to discontinue to Tikosyn  start the patient on a diltiazem  drip and his team will consult.  Her case was discussed with the hospitalist for admission   Clinical Course as of 05/23/24 1558  Tue May 23, 2024  1400 Troponin I (High Sensitivity): 16 Slightly milder than in previous [HD]  1422 Patients case discussed with Dr. Wilburn.  He recommends holding the Tikosyn  and starting the patient on a diltiazem  drip.  He recommends admission and he will consult on the patient [HD]    Clinical Course User Index [HD] Nicholaus Rolland BRAVO, MD   Data(2/3 categories following were performed): I reviewed or ordered at least three unique tests, external notes, and/or the history required an independent historian as one of the three requirements as following: At least 3 labs/imaging studies were obtained and/or reviewed. AND  I discussed the management of the patient with the following external physician or qualified healthcare provider: Admitting physician  Risk: This patient has a high risk of morbidity due to further diagnostic testing or treatment. Rationale: Decision made regarding admission  Admit Level 5 - Labs/Rads, Admit,  Consult: Cardiology  Suggested E/M Coding Level: 5, 99285  This level has been selected based on the 06/03/2022 CPT guidelines for E/M codes in the Emergency Department based on 2/3 of the CoPA, Data, and Risk.   FINAL CLINICAL IMPRESSION(S) / ED DIAGNOSES   Final diagnoses:  Atrial fibrillation with rapid ventricular response (HCC)  Palpitations     Rx / DC Orders   ED Discharge Orders     None        Note:  This document was prepared using Dragon voice recognition software and may include unintentional dictation errors.   Nicholaus Rolland BRAVO, MD 05/23/24 [phone]

## 2024-05-23 NOTE — Consult Note (Signed)
 [facility] CARDIOLOGY CONSULT NOTE       Patient ID: Maria Trujillo MRN: [id number] DOB/AGE: 88-Feb-1935 88 y.o.  Admit date: 05/23/2024 Referring Physician Dr. Nicholaus Primary Physician Feldpausch, Cheryl BRAVO, MD Primary Cardiologist Dr. Ammon Reason for Consultation Persistent AF  HPI: Maria Trujillo is a 89 y.o. female  with a past medical history of persistent atrial fibrillation (DCCV 02/2023, on Tikosyn ), hypertension, hyperlipidemia, history bradycardia, history NSTEMI (10/2020), coronary artery disease s/p stent to RCA (10/2020) who presented to the ED on 05/23/2024 for intermittent palpitations and worsening generalized weakness for the past 2 weeks.  Of note patient sees electrophysiology at [facility] heart care, was recently seen on 02/29/2024. Cardiology was consulted for further evaluation of atrial fibrillation.  Patient presented to the ED with remittent palpitations and worsening generalized weakness for the past 2 weeks.  Patient found to be in atrial fibrillation with RVR, PVCs rate 126 bpm. Work up in the ED notable for sodium 141, potassium 4.1, Mg 2.0, creatinine 0.68, hemoglobin 12, platelets 149.  Troponin negative x 2, BNP elevated at 720. Patient given IV fluids, 1X of IV metoprolol  tartrate, and started on IV diltiazem  infusion.  At the time of my evaluation this afternoon, patient was resting comfortably in ED stretcher with family at bedside. We discussed patients sxs in further detail. Patient states for past month she's been having palpitations and lightheadedness intermittently. Patient states during those times she would take a metoprolol  and that would help slow her HR and relieve sxs after about 1 hour. Unclear if patient has been in/out of AF or has been in AF with intermittently elevated HR for the past month. Patient also describes having a conversion pauses sometimes after taking metoprolol  as well. Patient was loaded and started in dofetilide  in 2024 and higher doses  have been attempted however patient not tolerate and have QT prolongation. Patient has been on lowest dose for past year. Previous patient hasn't been on amiodarone  due to reportedly elevated LFTs and abnormal TSH. Most recent LFTs and TSH have been normal.    Review of systems complete and found to be negative unless listed above    Past Medical History:  Diagnosis Date   Arthritis    Atrial fibrillation (HCC)    History of kidney stones    Hypothyroidism    Myocardial infarct (HCC)    PONV (postoperative nausea and vomiting)     Past Surgical History:  Procedure Laterality Date   BREAST EXCISIONAL BIOPSY Bilateral 1984   benign   CARDIOVERSION N/A 03/04/2023   Procedure: CARDIOVERSION;  Surgeon: Ammon Blunt, MD;  Location: [facility] ORS;  Service: Cardiovascular;  Laterality: N/A;   CATARACT EXTRACTION, BILATERAL     CHOLECYSTECTOMY     EYE SURGERY     JOINT REPLACEMENT Left 12/2012   knee   KNEE ARTHROPLASTY Right 02/15/2017   Procedure: COMPUTER ASSISTED TOTAL KNEE ARTHROPLASTY;  Surgeon: Lynwood SHAUNNA Hue, MD;  Location: [facility] ORS;  Service: Orthopedics;  Laterality: Right;   LEFT HEART CATH AND CORONARY ANGIOGRAPHY N/A 11/04/2020   Procedure: LEFT HEART CATH AND CORONARY ANGIOGRAPHY;  Surgeon: Ammon Blunt, MD;  Location: [facility] INVASIVE CV LAB;  Service: Cardiovascular;  Laterality: N/A;   PHOTOCOAGULATION WITH LASER Right 04/20/2018   Procedure: TRANSCLERAL DIODE CYCLOPHOTOCOAGULATION  RIGHT per [place] block;  Surgeon: Mittie Gaskin, MD;  Location: [facility];  Service: Ophthalmology;  Laterality: Right;   ROTATOR CUFF REPAIR Left 2010   TONSILLECTOMY      (Not  in a hospital admission)  Social History   Socioeconomic History   Marital status: Widowed    Spouse name: Not on file   Number of children: Not on file   Years of education: Not on file   Highest education level: Not on file  Occupational History   Not on file  Tobacco Use   Smoking  status: Never   Smokeless tobacco: Never  Vaping Use   Vaping status: Never Used  Substance and Sexual Activity   Alcohol  use: No   Drug use: No   Sexual activity: Not on file  Other Topics Concern   Not on file  Social History Narrative   Not on file   Social Drivers of Health   Financial Resource Strain: Low Risk  (01/20/2024)   Received from [facility]   Overall Financial Resource Strain (CARDIA)    Difficulty of Paying Living Expenses: Not hard at all  Food Insecurity: No Food Insecurity (01/20/2024)   Received from [facility]   Hunger Vital Sign    Within the past 12 months, you worried that your food would run out before you got the money to buy more.: Never true    Within the past 12 months, the food you bought just didn't last and you didn't have money to get more.: Never true  Transportation Needs: No Transportation Needs (01/20/2024)   Received from [facility] - Transportation    In the past 12 months, has lack of transportation kept you from medical appointments or from getting medications?: No    Lack of Transportation (Non-Medical): No  Physical Activity: Inactive (11/03/2018)   Received from [facility]   Exercise Vital Sign    Days of Exercise per Week: 0 days    Minutes of Exercise per Session: 0 min  Stress: No Stress Concern Present (11/03/2018)   Received from [facility] - Occupational Stress Questionnaire    Feeling of Stress : Not at all  Social Connections: Moderately Isolated (11/03/2018)   Received from [facility]   Social Connection and Isolation Panel    Frequency of Communication with Friends and Family: More than three times a week    Frequency of Social Gatherings with Friends and Family: More than three times a week    Attends Religious Services: More than 4 times per year    Active Member of [employer] or Organizations: No    Attends  Banker Meetings: Never    Marital Status: Widowed  Intimate Partner Violence: Not At Risk (05/24/2023)   Humiliation, Afraid, Rape, and Kick questionnaire    Fear of Current or Ex-Partner: No    Emotionally Abused: No    Physically Abused: No    Sexually Abused: No    Family History  Problem Relation Age of Onset   Breast cancer Sister 28     Vitals:   05/23/24 1258 05/23/24 1259 05/23/24 1400 05/23/24 1422  BP: (!) 129/99  122/88   Pulse: (!) 40  (!) 144 (!) 132  Resp: 16  19   Temp: 97.6 F (36.4 C)     TempSrc: Oral     SpO2: 100%  100%   Weight:  72.6 kg    Height:  5' 4 (1.626 m)      PHYSICAL EXAM General: Well appearing elderly female, well nourished, in no acute distress. HEENT: Normocephalic and atraumatic. Neck:  No JVD.   Lungs: Normal respiratory effort on room air. Clear bilaterally to auscultation. No wheezes, crackles, rhonchi.  Heart: Irregularly, irregular, elevated rates. Normal S1 and S2 without gallops or murmurs.  Abdomen: Non-distended appearing.  Msk: Normal strength and tone for age. Extremities: Warm and well perfused. No clubbing, cyanosis, edema.  Neuro: Alert and oriented X 3. Psych: Answers questions appropriately.   Labs: Basic Metabolic Panel: Recent Labs    05/23/24 1302  NA 141  K 4.1  CL 106  CO2 24  GLUCOSE 102*  BUN 11  CREATININE 0.68  CALCIUM  9.6   Liver Function Tests: No results for input(s): AST, ALT, ALKPHOS, BILITOT, PROT, ALBUMIN in the last 72 hours. No results for input(s): LIPASE, AMYLASE in the last 72 hours. CBC: Recent Labs    05/23/24 1302  WBC 6.0  HGB 12.2  HCT 37.7  MCV 93.8  PLT 149*   Cardiac Enzymes: Recent Labs    05/23/24 1302  TROPONINIHS 16   BNP: Recent Labs    05/23/24 1302  BNP 720.2*   D-Dimer: No results for input(s): DDIMER in the last 72 hours. Hemoglobin A1C: No results for input(s): HGBA1C in the last 72 hours. Fasting Lipid  Panel: No results for input(s): CHOL, HDL, LDLCALC, TRIG, CHOLHDL, LDLDIRECT in the last 72 hours. Thyroid  Function Tests: No results for input(s): TSH, T4TOTAL, T3FREE, THYROIDAB in the last 72 hours.  Invalid input(s): FREET3 Anemia Panel: No results for input(s): VITAMINB12, FOLATE, FERRITIN, TIBC, IRON, RETICCTPCT in the last 72 hours.   Radiology: [facility] Chest Port 1 View Result Date: 05/23/2024 CLINICAL DATA:  Cardiac palpitations weakness for several weeks. EXAM: PORTABLE CHEST 1 VIEW COMPARISON:  November 18, 2022. FINDINGS: Stable cardiomediastinal silhouette. No definite acute pulmonary abnormality is noted. Degenerative changes are seen involving both shoulders. IMPRESSION: No active disease. Electronically Signed   By: Lynwood Landy Raddle M.D.   On: 05/23/2024 13:53    ECHO ordered  TELEMETRY reviewed by me 05/23/2024: atrial fibrillation, rate 90-100s (s/p IV dit)  EKG reviewed by me: atrial fibrillation with RVR, PVCs rate 126 bpm.  Data reviewed by me 05/23/2024: last 24h vitals tele labs imaging I/O ED provider note, admission H&P.  Active Problems:   * No active hospital problems. *    ASSESSMENT AND PLAN:  Maria Trujillo is a 89 y.o. female  with a past medical history of persistent atrial fibrillation (DCCV 02/2023, on Tikosyn ), hypertension, hyperlipidemia, history bradycardia, history NSTEMI (10/2020), coronary artery disease s/p stent to RCA (10/2020) who presented to the ED on 05/23/2024 for intermittent palpitations and worsening generalized weakness for the past 2 weeks.  Of note patient sees electrophysiology at [facility] heart care, was recently seen on 02/29/2024. Cardiology was consulted for further evaluation of atrial fibrillation.  # Paroxysmal atrial fibrillation # Atrial fibrillation RVR # Hx Bradycardia Patient with hx of failed DCCV in 2024. Patient sees outpatient electrophysiology at [facility], recently seen on 02/29/2024 and  has been on dofetilide  since 2024 at highest tolerated dose. EKG in ED with atrial fibrillation with RVR, PVCs rate 126 bpm. Per tele remains in AF, with improved rate 90-100s s/p IV dil. No sign of sinus rhythm per tele.  -Echo ordered. TSH ordered. LFTs ordered. -Monitor and replenish electrolytes for a goal K >4, Mag >2  -Continue home Eliquis  5 mg twice daily for stroke risk reduction. -Will not resume home dofetilide  125 mcg.  -Wean off IV diltiazem  gtt. -Ordered IV amio load and  infusion. -Plan for TEE/DCCV tomorrow afternoon with Dr. Wilburn if patient doesn't convert to SR while on IV amio. NPO at midnight. Patient is agreeable to proceeding with TEE/DCCV. Ensure at least 2 doses of Eliquis  given prior to procedure.  # Coronary artery disease s/p stent to RCA (10/2020) # Hypertension # Hyperlipidemia Troponin negative x 2. EKG without acute ischemic changes. -Continue home aspirin  81 mg, atorvastatin  40 mg daily. -Continue home Imdur  30 mg daily. -Continue home lisinopril  5 mg daily. -Not on BB due to hx bradycardia. Patient was taking metoprolol  prn for when she felt she was in AF RVR.   This patient's plan of care was discussed and created with Dr. Wilburn and he is in agreement.  Signed: Dorene Comfort, PA-C  05/23/2024, 2:25 PM [facility]

## 2024-05-23 NOTE — ED Triage Notes (Signed)
 Pt c/o intermittent palpations and increasing weakness x2-3 weeks.  Pt reports she has a condition that causes her heart to go out of rhythm.  Pt reports taking Tikosyn  and Metoprolol  w/o relief.

## 2024-05-24 DIAGNOSIS — I480 Paroxysmal atrial fibrillation: Secondary | ICD-10-CM | POA: Diagnosis not present

## 2024-05-24 DIAGNOSIS — Z9861 Coronary angioplasty status: Secondary | ICD-10-CM | POA: Diagnosis not present

## 2024-05-24 DIAGNOSIS — R001 Bradycardia, unspecified: Secondary | ICD-10-CM | POA: Diagnosis not present

## 2024-05-24 DIAGNOSIS — I4891 Unspecified atrial fibrillation: Secondary | ICD-10-CM | POA: Diagnosis not present

## 2024-05-24 DIAGNOSIS — E785 Hyperlipidemia, unspecified: Secondary | ICD-10-CM | POA: Diagnosis not present

## 2024-05-24 DIAGNOSIS — I10 Essential (primary) hypertension: Secondary | ICD-10-CM | POA: Diagnosis not present

## 2024-05-24 LAB — BASIC METABOLIC PANEL WITH GFR
Anion gap: 8 (ref 5–15)
BUN: 13 mg/dL (ref 8–23)
CO2: 29 mmol/L (ref 22–32)
Calcium: 9.1 mg/dL (ref 8.9–10.3)
Chloride: 107 mmol/L (ref 98–111)
Creatinine, Ser: 0.58 mg/dL (ref 0.44–1.00)
GFR, Estimated: >60 mL/min (ref >60)
Glucose, Bld: 96 mg/dL (ref 70–99)
Potassium: 3.5 mmol/L (ref 3.5–5.1)
Sodium: 144 mmol/L (ref 135–145)

## 2024-05-24 LAB — CBC
HCT: 35.4 % — ABNORMAL LOW (ref 36.0–46.0)
Hemoglobin: 11.7 g/dL — ABNORMAL LOW (ref 12.0–15.0)
MCH: 30.8 pg (ref 26.0–34.0)
MCHC: 33.1 g/dL (ref 30.0–36.0)
MCV: 93.2 fL (ref 80.0–100.0)
Platelets: 133 K/uL — ABNORMAL LOW (ref 150–400)
RBC: 3.8 MIL/uL — ABNORMAL LOW (ref 3.87–5.11)
RDW: 13.3 % (ref 11.5–15.5)
WBC: 6.2 K/uL (ref 4.0–10.5)
nRBC: 0 % (ref 0.0–0.2)

## 2024-05-24 LAB — MAGNESIUM: Magnesium: 2.1 mg/dL (ref 1.7–2.4)

## 2024-05-24 SURGERY — ECHOCARDIOGRAM, TRANSESOPHAGEAL
Anesthesia: General

## 2024-05-24 MED ORDER — AMIODARONE HCL 200 MG PO TABS: ORAL_TABLET | ORAL | 0 refills | Status: DC

## 2024-05-24 MED ORDER — METOPROLOL TARTRATE 25 MG PO TABS: 12.5000 mg | ORAL_TABLET | Freq: Every day | ORAL | 1 refills | Status: AC | PRN

## 2024-05-24 MED ORDER — AMIODARONE HCL 200 MG PO TABS
400.0000 mg | ORAL_TABLET | Freq: Two times a day (BID) | ORAL | Status: DC
  Administered 2024-05-24: 400 mg via ORAL
  Filled 2024-05-24: qty 2

## 2024-05-24 MED ORDER — AMIODARONE HCL 200 MG PO TABS: 200.0000 mg | ORAL_TABLET | Freq: Every day | ORAL | Status: DC

## 2024-05-24 NOTE — Care Management Obs Status (Signed)
 MEDICARE OBSERVATION STATUS NOTIFICATION   Patient Details  Name: Maria Trujillo MRN: [id number] Date of Birth: [birth year]   Medicare Observation Status Notification Given:  Yes    [name] JAYSON Carpen, LCSW 05/24/2024, 11:37 AM

## 2024-05-24 NOTE — Care Management CC44 (Signed)
 Condition Code 44 Documentation Completed  Patient Details  Name: MEARL HAREWOOD MRN: [id number] Date of Birth: July 20, 1934   Condition Code 44 given:  Yes Patient signature on Condition Code 44 notice:  Yes Documentation of 2 MD's agreement:  Yes Code 44 added to claim:  Yes    [name] JAYSON Carpen, LCSW 05/24/2024, 11:37 AM

## 2024-05-24 NOTE — Discharge Summary (Incomplete)
 Physician Discharge Summary   AVAYA MCJUNKINS  female DOB: [birth year]  FMW:[id number]  PCP: Jeffie Cheryl BRAVO, MD  Admit date: 05/23/2024 Discharge date: 05/24/2024  Admitted From: home Disposition:  home CODE STATUS: Full code   Hospital Course:  For full details, please see H&P, progress notes, consult notes and ancillary notes.  Briefly,  Maria Trujillo is a 89 y.o. year old female with past medical history of atrial fibrillation on Tikosyn  and Eliquis , hypertension, coronary artery disease, hypothyroidism.  She presented to [facility] regional ED with reports of palpitations and increasing weakness that has been ongoing for approximately 3-4 weeks.    Patient reported early on she was able to take PRN metoprolol  and it relieved the palpitations however she notes that now the palpitations continue despite the metoprolol .  She endorses dizziness/lightheadedness, and reports she has blacked out for a split-second while sitting at home.   ##Atrial Fibrillation with Rapid Ventricular Response # Paroxysmal atrial fibrillation  --per cardio, Patient with hx of failed DCCV in 2024. Patient sees outpatient electrophysiology at [facility], recently seen on 02/29/2024 and has been on dofetilide  since 2024 at highest tolerated dose. EKG in ED with atrial fibrillation with RVR, PVCs rate 126 bpm. Per tele remained in AF, with improved rate 90-100s s/p IV dil.  Per tele pt converted to SR on 07/29 around 10PM s/p IV amio and has remained in SR. TSH and LFTs within normal limits. -Continue home Eliquis  5 mg twice daily for stroke risk reduction. -Will not resume home dofetilide  125 mcg due to dofetilide  failure. -Transition IV to PO amio 400 mg BID for 10 days, then 200 mg daily.   ##Syncope Patient reports blacking out for a split-second while seated on the couch.  Denies any falls, denies head trauma.  No neuro deficits.  Likely due to cardiac arrhythmia.   --orthostatic BP neg.    #Hypertension  - Continue home Imdur  and lisinopril    #Coronary artery disease -Continue home atorvastatin  and ASA   #Hypothyroidism - Continue home Synthroid    #Glaucoma - Continue home eyedrops    Unless noted above, medications under STOP list are ones pt was not taking PTA.  Discharge Diagnoses:  Principal Problem:   Atrial fibrillation with rapid ventricular response (HCC) Active Problems:   HTN (hypertension)   Hyperlipidemia, unspecified   Acquired hypothyroidism   Primary open angle glaucoma (POAG) of both eyes, severe stage   30 Day Unplanned Readmission Risk Score    Flowsheet Row ED from 05/23/2024 in [facility] Department at [facility]  30 Day Unplanned Readmission Risk Score (%) 14.19 Filed at 05/24/2024 0801    This score is the patient's risk of an unplanned readmission within 30 days of being discharged (0 -100%). The score is based on dignosis, age, lab data, medications, orders, and past utilization.   Low:  0-14.9   Medium: 15-21.9   High: 22-29.9   Extreme: 30 and above         Discharge Instructions:  Allergies as of 05/24/2024       Reactions   Celecoxib Hives   Dorzolamide Shortness Of Breath   Other Reaction(s): Other (See Comments) Pt stated had reactions but unable to provide information   Penicillins Other (See Comments), Hives, Rash   Redness (sunburn type rash with peeling) Has patient had a PCN reaction causing immediate rash, facial/tongue/throat swelling, SOB or lightheadedness with hypotension: No Has patient had a PCN reaction causing severe rash involving mucus membranes  or skin necrosis: No Has patient had a PCN reaction that required hospitalization No Has patient had a PCN reaction occurring within the last 10 years: Yes If all of the above answers are NO, then may proceed with Cephalosporin use. Other Reaction(s): Other (See Comments) Redness (sunburn type rash with peeling)  Has patient had a PCN  reaction causing immediate rash, facial/tongue/throat swelling, SOB or lightheadedness with hypotension: No  Has patient had a PCN reaction causing severe rash involving mucus membranes or skin necrosis: No  Has patient had a PCN reaction that required hospitalization No  Has patient had a PCN reaction occurring within the last 10 years: Yes  If all of the above answers are NO, then may proceed with Cephalosporin use.  Redness and flushing  Redness and flushing  Redness (sunburn type rash with peeling)   Shellfish Allergy Hives, Itching   Timolol  Maleate Other (See Comments), Shortness Of Breath   Redness Other Reaction(s): Other (See Comments) Shortness of breath  Redness  Redness  Higher Doses  Shortness of breath    Redness   Sulfa Antibiotics Other (See Comments)   Redness in eyes   Amoxicillin Rash   Amoxicillin-pot Clavulanate Other (See Comments)   Redness and flushing   Brimonidine Other (See Comments)   blisters   Cefuroxime Axetil Other (See Comments)   redness   Dipivefrin Other (See Comments)   sleepiness   Oxycodone     BP dropped Other Reaction(s): Other (See Comments) Dropped BP down too low Dropped blood pressure   BP dropped  Dropped BP down too low        Medication List     STOP taking these medications    dofetilide  125 MCG capsule Commonly known as: TIKOSYN    hydrocortisone valerate cream 0.2 % Commonly known as: WESTCORT   moxifloxacin 0.5 % ophthalmic solution Commonly known as: VIGAMOX       TAKE these medications    acetaminophen  650 MG CR tablet Commonly known as: TYLENOL  Take 650 mg by mouth daily as needed for pain.   amiodarone  200 MG tablet Commonly known as: PACERONE  Take 2 tablets (400 mg total) by mouth 2 (two) times daily for 10 days, THEN 1 tablet (200 mg total) daily. Start taking on: May 24, 2024   apixaban  5 MG Tabs tablet Commonly known as: ELIQUIS  Take 1 tablet (5 mg total) by mouth 2 (two) times daily.    aspirin  81 MG tablet Take 81 mg by mouth at bedtime.   atorvastatin  40 MG tablet Commonly known as: LIPITOR  Take 40 mg by mouth daily.   azelastine  0.1 % nasal spray Commonly known as: ASTELIN  Place 1 spray into both nostrils as needed.   bimatoprost 0.03 % ophthalmic solution Commonly known as: LUMIGAN Place 1 drop into the right eye at bedtime.   Calcium  Carbonate-Vitamin D3 600-400 MG-UNIT Tabs Take 1 tablet by mouth daily.   cholecalciferol  1000 units tablet Commonly known as: VITAMIN D  Take 1,000 Units by mouth daily.   fexofenadine 180 MG tablet Commonly known as: ALLEGRA 180 mg daily as needed for allergies.   furosemide  20 MG tablet Commonly known as: LASIX  Take 20 mg by mouth daily. prn   isosorbide  mononitrate 30 MG 24 hr tablet Commonly known as: IMDUR  Take 1 tablet (30 mg total) by mouth daily.   levothyroxine  100 MCG tablet Commonly known as: SYNTHROID  Take 100 mcg by mouth daily before breakfast.   lisinopril  5 MG tablet Commonly known as: ZESTRIL  Take 1  tablet (5 mg total) by mouth daily.   metoprolol  tartrate 25 MG tablet Commonly known as: LOPRESSOR  Take 0.5 tablets (12.5 mg total) by mouth daily as needed. for heart rate over 100.  Home med. What changed:  when to take this reasons to take this additional instructions   multivitamin with minerals Tabs tablet Take 1 tablet by mouth daily.   polyethylene glycol powder 17 GM/SCOOP powder Commonly known as: GLYCOLAX /MIRALAX  Take by mouth.   PRESERVISION AREDS 2 PO Take 1 tablet by mouth 2 (two) times daily.   timolol  0.5 % ophthalmic solution Commonly known as: TIMOPTIC  Place 1 drop into the right eye in the morning.         Follow-up Information     [name], Alexander, MD. Go in 1 week(s).   Specialty: Cardiology Contact information: [address] [facility] [place] [address] [phone]                 Allergies  Allergen  Reactions   Celecoxib Hives   Dorzolamide Shortness Of Breath    Other Reaction(s): Other (See Comments)  Pt stated had reactions but unable to provide information   Penicillins Other (See Comments), Hives and Rash    Redness (sunburn type rash with peeling)  Has patient had a PCN reaction causing immediate rash, facial/tongue/throat swelling, SOB or lightheadedness with hypotension: No  Has patient had a PCN reaction causing severe rash involving mucus membranes or skin necrosis: No  Has patient had a PCN reaction that required hospitalization No  Has patient had a PCN reaction occurring within the last 10 years: Yes  If all of the above answers are NO, then may proceed with Cephalosporin use.  Other Reaction(s): Other (See Comments)  Redness (sunburn type rash with peeling)  Has patient had a PCN reaction causing immediate rash, facial/tongue/throat swelling, SOB or lightheadedness with hypotension: No  Has patient had a PCN reaction causing severe rash involving mucus membranes or skin necrosis: No  Has patient had a PCN reaction that required hospitalization No  Has patient had a PCN reaction occurring within the last 10 years: Yes  If all of the above answers are NO, then may proceed with Cephalosporin use.  Redness and flushing  Redness and flushing  Redness (sunburn type rash with peeling)   Shellfish Allergy Hives and Itching   Timolol  Maleate Other (See Comments) and Shortness Of Breath    Redness  Other Reaction(s): Other (See Comments)  Shortness of breath  Redness  Redness  Higher Doses  Shortness of breath    Redness   Sulfa Antibiotics Other (See Comments)    Redness in eyes   Amoxicillin Rash   Amoxicillin-Pot Clavulanate Other (See Comments)    Redness and flushing   Brimonidine Other (See Comments)    blisters   Cefuroxime Axetil Other (See Comments)    redness   Dipivefrin Other (See Comments)    sleepiness   Oxycodone      BP  dropped  Other Reaction(s): Other (See Comments)  Dropped BP down too low  Dropped blood pressure   BP dropped  Dropped BP down too low     The results of significant diagnostics from this hospitalization (including imaging, microbiology, ancillary and laboratory) are listed below for reference.   Consultations:   Procedures/Studies: DG Chest Port 1 View Result Date: 05/23/2024 CLINICAL DATA:  Cardiac palpitations weakness for several weeks. EXAM: PORTABLE CHEST 1 VIEW COMPARISON:  November 18, 2022. FINDINGS: Stable cardiomediastinal  silhouette. No definite acute pulmonary abnormality is noted. Degenerative changes are seen involving both shoulders. IMPRESSION: No active disease. Electronically Signed   By: Lynwood Landy Raddle M.D.   On: 05/23/2024 13:53      Labs: BNP (last 3 results) Recent Labs    05/23/24 1302  BNP 720.2*   Basic Metabolic Panel: Recent Labs  Lab 05/23/24 1302 05/24/24 0338  NA 141 144  K 4.1 3.5  CL 106 107  CO2 24 29  GLUCOSE 102* 96  BUN 11 13  CREATININE 0.68 0.58  CALCIUM  9.6 9.1  MG 2.0 2.1   Liver Function Tests: Recent Labs  Lab 05/23/24 1435  AST 29  ALT 19  ALKPHOS 88  BILITOT 0.9  PROT 6.5  ALBUMIN 3.7   No results for input(s): LIPASE, AMYLASE in the last 168 hours. No results for input(s): AMMONIA in the last 168 hours. CBC: Recent Labs  Lab 05/23/24 1302 05/24/24 0338  WBC 6.0 6.2  HGB 12.2 11.7*  HCT 37.7 35.4*  MCV 93.8 93.2  PLT 149* 133*   Cardiac Enzymes: No results for input(s): CKTOTAL, CKMB, CKMBINDEX, TROPONINI in the last 168 hours. BNP: Invalid input(s): POCBNP CBG: No results for input(s): GLUCAP in the last 168 hours. D-Dimer No results for input(s): DDIMER in the last 72 hours. Hgb A1c No results for input(s): HGBA1C in the last 72 hours. Lipid Profile No results for input(s): CHOL, HDL, LDLCALC, TRIG, CHOLHDL, LDLDIRECT in the last 72 hours. Thyroid   function studies Recent Labs    05/23/24 1435  TSH 1.678   Anemia work up No results for input(s): VITAMINB12, FOLATE, FERRITIN, TIBC, IRON, RETICCTPCT in the last 72 hours. Urinalysis    Component Value Date/Time   COLORURINE YELLOW (A) 11/27/2023 1505   APPEARANCEUR CLEAR (A) 11/27/2023 1505   APPEARANCEUR Clear 12/12/2012 1557   LABSPEC 1.018 11/27/2023 1505   LABSPEC 1.013 12/12/2012 1557   PHURINE 5.0 11/27/2023 1505   GLUCOSEU NEGATIVE 11/27/2023 1505   GLUCOSEU Negative 12/12/2012 1557   HGBUR NEGATIVE 11/27/2023 1505   BILIRUBINUR NEGATIVE 11/27/2023 1505   BILIRUBINUR Negative 12/12/2012 1557   KETONESUR 5 (A) 11/27/2023 1505   PROTEINUR NEGATIVE 11/27/2023 1505   NITRITE NEGATIVE 11/27/2023 1505   LEUKOCYTESUR NEGATIVE 11/27/2023 1505   LEUKOCYTESUR Trace 12/12/2012 1557   Sepsis Labs Recent Labs  Lab 05/23/24 1302 05/24/24 0338  WBC 6.0 6.2   Microbiology No results found for this or any previous visit (from the past 240 hours).   Total time spend on discharging this patient, including the last patient exam, discussing the hospital stay, instructions for ongoing care as it relates to all pertinent caregivers, as well as preparing the medical discharge records, prescriptions, and/or referrals as applicable, is 40 minutes.    Ellouise Haber, MD  [facility] 05/24/2024, 11:16 AM

## 2024-05-24 NOTE — Evaluation (Signed)
 Physical Therapy Evaluation Patient Details Name: Maria Trujillo MRN: [id number] DOB: 1934-10-25 Today's Date: 05/24/2024  History of Present Illness  Pt admitted for Afib with RVR with complaints of chest palpatations and syncope. History includes Afib, HTN, HLD, CAD, and glacoma.  Clinical Impression  Pt cleared to participate via MD, reporting no need for TEE this date. Pt is a pleasant 89 year old female who was admitted for Afib with RVR. HR controlled with all mobility. Pt demonstrates all bed mobility/transfers/ambulation at baseline level. Pt does not require any further PT needs at this time. Pt will be dc in house and does not require follow up. RN aware. Will dc current orders.  Orthostatic VS for the past 24 hrs:  BP- Lying Pulse- Lying BP- Sitting Pulse- Sitting BP- Standing at 0 minutes Pulse- Standing at 0 minutes  05/24/24 1039 142/79 67 157/70 55 157/63 54  05/23/24 1740 127/71 106 125/73 102 108/76 105             If plan is discharge home, recommend the following:     Can travel by private vehicle        Equipment Recommendations None recommended by PT  Recommendations for Other Services       Functional Status Assessment Patient has not had a recent decline in their functional status     Precautions / Restrictions Precautions Precautions: Fall Recall of Precautions/Restrictions: Intact Restrictions Weight Bearing Restrictions Per Provider Order: No      Mobility  Bed Mobility Overal bed mobility: Independent             General bed mobility comments: safe technique    Transfers Overall transfer level: Needs assistance Equipment used: Straight cane Transfers: Sit to/from Stand Sit to Stand: Min assist           General transfer comment: needs slight min assist to stand from low surface in addition to line/lead management    Ambulation/Gait Ambulation/Gait assistance: Supervision Gait Distance (Feet): 120 Feet Assistive device:  Straight cane Gait Pattern/deviations: Step-through pattern       General Gait Details: slow gait speed with reciprocal gait pattern. Slightly unsteady initially, however with incraesed distance, does improve. No dizziness or SOB symptoms noted  Stairs            Wheelchair Mobility     Tilt Bed    Modified Rankin (Stroke Patients Only)       Balance Overall balance assessment: Needs assistance Sitting-balance support: Feet supported Sitting balance-Leahy Scale: Good     Standing balance support: Single extremity supported Standing balance-Leahy Scale: Good                               Pertinent Vitals/Pain Pain Assessment Pain Assessment: No/denies pain    Home Living Family/patient expects to be discharged to:: Private residence Living Arrangements: Alone Available Help at Discharge: Family;Available PRN/intermittently Type of Home: House Home Access: Ramped entrance       Home Layout: One level Home Equipment: Shower seat - built in;Cane - single point Additional Comments: daughter is nurse and plans to stay with her through the weekend    Prior Function Prior Level of Function : Independent/Modified Independent             Mobility Comments: indep with SPC ADLs Comments: indep     Extremity/Trunk Assessment   Upper Extremity Assessment Upper Extremity Assessment: Overall WFL for tasks assessed  Lower Extremity Assessment Lower Extremity Assessment: Overall WFL for tasks assessed       Communication   Communication Communication: No apparent difficulties    Cognition Arousal: Alert Behavior During Therapy: WFL for tasks assessed/performed   PT - Cognitive impairments: No apparent impairments                       PT - Cognition Comments: pleasant and agreeable to session Following commands: Intact       Cueing Cueing Techniques: Verbal cues     General Comments      Exercises Other  Exercises Other Exercises: ambulated to bathroom with ability to perform self toileting and hygiene indep. HR stable throughout mobility   Assessment/Plan    PT Assessment Patient does not need any further PT services  PT Problem List         PT Treatment Interventions      PT Goals (Current goals can be found in the Care Plan section)  Acute Rehab PT Goals Patient Stated Goal: to go home PT Goal Formulation: All assessment and education complete, DC therapy Time For Goal Achievement: 05/24/24 Potential to Achieve Goals: Good    Frequency       Co-evaluation               AM-PAC PT 6 Clicks Mobility  Outcome Measure Help needed turning from your back to your side while in a flat bed without using bedrails?: None Help needed moving from lying on your back to sitting on the side of a flat bed without using bedrails?: None Help needed moving to and from a bed to a chair (including a wheelchair)?: None Help needed standing up from a chair using your arms (e.g., wheelchair or bedside chair)?: None Help needed to walk in hospital room?: A Little Help needed climbing 3-5 steps with a railing? : A Little 6 Click Score: 22    End of Session   Activity Tolerance: Patient tolerated treatment well Patient left: in chair;with call bell/phone within reach Nurse Communication: Mobility status PT Visit Diagnosis: Difficulty in walking, not elsewhere classified (R26.2)    Time: 8990-8967 PT Time Calculation (min) (ACUTE ONLY): 23 min   Charges:   PT Evaluation $PT Eval Low Complexity: 1 Low PT Treatments $Therapeutic Activity: 8-22 mins PT General Charges $$ ACUTE PT VISIT: 1 Visit         Corean Dade, PT, DPT, GCS [phone]   Joahan Swatzell 05/24/2024, 10:48 AM

## 2024-05-24 NOTE — Progress Notes (Signed)
 PT Cancellation Note  Patient Details Name: Maria Trujillo MRN: [id number] DOB: 07-15-1934   Cancelled Treatment:    Reason Eval/Treat Not Completed: Other (comment). Pt pending TEE this date, currently NPO on amio drip. Will hold off on exertional evaluation at this time.   Darnella Zeiter 05/24/2024, 9:26 AM Corean Dade, PT, DPT, GCS [phone]

## 2024-05-24 NOTE — Progress Notes (Signed)
 [facility] CLINIC CARDIOLOGY PROGRESS NOTE       Patient ID: Maria Trujillo MRN: [id number] DOB/AGE: 88-Jun-1935 88 y.o.  Admit date: 05/23/2024 Referring Physician Dr. Nicholaus Primary Physician Feldpausch, Cheryl BRAVO, MD Primary Cardiologist Dr. Ammon Reason for Consultation Persistent AF  HPI: Maria Trujillo is a 89 y.o. female  with a past medical history of persistent atrial fibrillation (DCCV 02/2023, on Tikosyn ), hypertension, hyperlipidemia, history bradycardia, history NSTEMI (10/2020), coronary artery disease s/p stent to RCA (10/2020) who presented to the ED on 05/23/2024 for intermittent palpitations and worsening generalized weakness for the past 2 weeks.  Of note patient sees electrophysiology at [facility] heart care, was recently seen on 02/29/2024. Cardiology was consulted for further evaluation of atrial fibrillation.  Interval History: -Patient seen and examined this AM and laying comfortably in hospital bed with daughter at bedside.  Patient states she feels fine this a.m. and denies chest pain, palpitations, SOB or lightheadedness.  Patient appears euvolemic. -Patients BP and HR stable this AM. Overnight Tele showed no significant events. Per tele pt converted to SR on 07/29 around 10PM s/p IV amio and has remained in SR. -Patient remains on room air with stable SpO2.    Review of systems complete and found to be negative unless listed above    Past Medical History:  Diagnosis Date   Arthritis    Atrial fibrillation (HCC)    History of kidney stones    Hypothyroidism    Myocardial infarct (HCC)    PONV (postoperative nausea and vomiting)     Past Surgical History:  Procedure Laterality Date   BREAST EXCISIONAL BIOPSY Bilateral 1984   benign   CARDIOVERSION N/A 03/04/2023   Procedure: CARDIOVERSION;  Surgeon: Ammon Blunt, MD;  Location: [facility] ORS;  Service: Cardiovascular;  Laterality: N/A;   CATARACT EXTRACTION, BILATERAL     CHOLECYSTECTOMY     EYE SURGERY      JOINT REPLACEMENT Left 12/2012   knee   KNEE ARTHROPLASTY Right 02/15/2017   Procedure: COMPUTER ASSISTED TOTAL KNEE ARTHROPLASTY;  Surgeon: Lynwood SHAUNNA Hue, MD;  Location: [facility] ORS;  Service: Orthopedics;  Laterality: Right;   LEFT HEART CATH AND CORONARY ANGIOGRAPHY N/A 11/04/2020   Procedure: LEFT HEART CATH AND CORONARY ANGIOGRAPHY;  Surgeon: Ammon Blunt, MD;  Location: [facility] INVASIVE CV LAB;  Service: Cardiovascular;  Laterality: N/A;   PHOTOCOAGULATION WITH LASER Right 04/20/2018   Procedure: TRANSCLERAL DIODE CYCLOPHOTOCOAGULATION  RIGHT per Hope block;  Surgeon: Mittie Gaskin, MD;  Location: [facility];  Service: Ophthalmology;  Laterality: Right;   ROTATOR CUFF REPAIR Left 2010   TONSILLECTOMY      (Not in a hospital admission)  Social History   Socioeconomic History   Marital status: Widowed    Spouse name: Not on file   Number of children: Not on file   Years of education: Not on file   Highest education level: Not on file  Occupational History   Not on file  Tobacco Use   Smoking status: Never   Smokeless tobacco: Never  Vaping Use   Vaping status: Never Used  Substance and Sexual Activity   Alcohol  use: No   Drug use: No   Sexual activity: Not on file  Other Topics Concern   Not on file  Social History Narrative   Not on file   Social Drivers of Health   Financial Resource Strain: Low Risk  (01/20/2024)   Received from [facility]   Overall Financial Resource Strain (CARDIA)  Difficulty of Paying Living Expenses: Not hard at all  Food Insecurity: No Food Insecurity (01/20/2024)   Received from [facility]   Hunger Vital Sign    Within the past 12 months, you worried that your food would run out before you got the money to buy more.: Never true    Within the past 12 months, the food you bought just didn't last and you didn't have money to get more.: Never true  Transportation Needs: No  Transportation Needs (01/20/2024)   Received from [facility] - Transportation    In the past 12 months, has lack of transportation kept you from medical appointments or from getting medications?: No    Lack of Transportation (Non-Medical): No  Physical Activity: Inactive (11/03/2018)   Received from [facility]   Exercise Vital Sign    Days of Exercise per Week: 0 days    Minutes of Exercise per Session: 0 min  Stress: No Stress Concern Present (11/03/2018)   Received from [facility] - Occupational Stress Questionnaire    Feeling of Stress : Not at all  Social Connections: Moderately Isolated (11/03/2018)   Received from [facility]   Social Connection and Isolation Panel    Frequency of Communication with Friends and Family: More than three times a week    Frequency of Social Gatherings with Friends and Family: More than three times a week    Attends Religious Services: More than 4 times per year    Active Member of [employer] or Organizations: No    Attends Banker Meetings: Never    Marital Status: Widowed  Intimate Partner Violence: Not At Risk (05/24/2023)   Humiliation, Afraid, Rape, and Kick questionnaire    Fear of Current or Ex-Partner: No    Emotionally Abused: No    Physically Abused: No    Sexually Abused: No    Family History  Problem Relation Age of Onset   Breast cancer Sister 29     Vitals:   05/24/24 0041 05/24/24 0138 05/24/24 0615 05/24/24 0800  BP:  (!) 111/57 123/65 130/60  Pulse:  (!) 56 (!) 58   Resp:  15 18 13   Temp: 98 F (36.7 C)  97.8 F (36.6 C)   TempSrc: Oral     SpO2:  95% 96%   Weight:      Height:        PHYSICAL EXAM General: Well appearing elderly female, well nourished, in no acute distress. HEENT: Normocephalic and atraumatic. Neck: No JVD.   Lungs: Normal respiratory effort on room air. Clear bilaterally to auscultation. No wheezes, crackles,  rhonchi.  Heart: HRRR. Normal S1 and S2 without gallops or murmurs.  Abdomen: Non-distended appearing.  Msk: Normal strength and tone for age. Extremities: Warm and well perfused. No clubbing, cyanosis, edema.  Neuro: Alert and oriented X 3. Psych: Answers questions appropriately.   Labs: Basic Metabolic Panel: Recent Labs    05/23/24 1302 05/24/24 0338  NA 141 144  K 4.1 3.5  CL 106 107  CO2 24 29  GLUCOSE 102* 96  BUN 11 13  CREATININE 0.68 0.58  CALCIUM  9.6 9.1  MG 2.0 2.1   Liver Function Tests: Recent Labs    05/23/24 1435  AST 29  ALT 19  ALKPHOS 88  BILITOT 0.9  PROT 6.5  ALBUMIN 3.7   No results for input(s): LIPASE, AMYLASE in the  last 72 hours. CBC: Recent Labs    05/23/24 1302 05/24/24 0338  WBC 6.0 6.2  HGB 12.2 11.7*  HCT 37.7 35.4*  MCV 93.8 93.2  PLT 149* 133*   Cardiac Enzymes: Recent Labs    05/23/24 1302 05/23/24 1435  TROPONINIHS 16 16   BNP: Recent Labs    05/23/24 1302  BNP 720.2*   D-Dimer: No results for input(s): DDIMER in the last 72 hours. Hemoglobin A1C: No results for input(s): HGBA1C in the last 72 hours. Fasting Lipid Panel: No results for input(s): CHOL, HDL, LDLCALC, TRIG, CHOLHDL, LDLDIRECT in the last 72 hours. Thyroid  Function Tests: Recent Labs    05/23/24 1435  TSH 1.678   Anemia Panel: No results for input(s): VITAMINB12, FOLATE, FERRITIN, TIBC, IRON, RETICCTPCT in the last 72 hours.   Radiology: [facility] Chest Port 1 View Result Date: 05/23/2024 CLINICAL DATA:  Cardiac palpitations weakness for several weeks. EXAM: PORTABLE CHEST 1 VIEW COMPARISON:  November 18, 2022. FINDINGS: Stable cardiomediastinal silhouette. No definite acute pulmonary abnormality is noted. Degenerative changes are seen involving both shoulders. IMPRESSION: No active disease. Electronically Signed   By: Lynwood Landy Raddle M.D.   On: 05/23/2024 13:53    ECHO scheduled to be done outpatient on 08/14 at 1  PM.  TELEMETRY reviewed by me 05/24/2024: Sinus rhythm, rate 60s  EKG reviewed by me: atrial fibrillation with RVR, PVCs rate 126 bpm.  Data reviewed by me 05/24/2024: last 24h vitals tele labs imaging I/O hospitalist progress notes.  Principal Problem:   Atrial fibrillation with rapid ventricular response (HCC) Active Problems:   HTN (hypertension)   Hyperlipidemia, unspecified   Acquired hypothyroidism   Primary open angle glaucoma (POAG) of both eyes, severe stage    ASSESSMENT AND PLAN:  Maria Trujillo is a 89 y.o. female  with a past medical history of persistent atrial fibrillation (DCCV 02/2023, on Tikosyn ), hypertension, hyperlipidemia, history bradycardia, history NSTEMI (10/2020), coronary artery disease s/p stent to RCA (10/2020) who presented to the ED on 05/23/2024 for intermittent palpitations and worsening generalized weakness for the past 2 weeks.  Of note patient sees electrophysiology at [facility] heart care, was recently seen on 02/29/2024. Cardiology was consulted for further evaluation of atrial fibrillation.  # Paroxysmal atrial fibrillation # Atrial fibrillation RVR # Hx Bradycardia Patient with hx of failed DCCV in 2024. Patient sees outpatient electrophysiology at [facility], recently seen on 02/29/2024 and has been on dofetilide  since 2024 at highest tolerated dose. EKG in ED with atrial fibrillation with RVR, PVCs rate 126 bpm. Per tele remains in AF, with improved rate 90-100s s/p IV dil. Per tele pt converted to SR on 07/29 around 10PM s/p IV amio and has remained in SR. TSH and LFTs within normal limits.  -Monitor and replenish electrolytes for a goal K >4, Mag >2  -Continue home Eliquis  5 mg twice daily for stroke risk reduction. -Will not resume home dofetilide  125 mcg due to dofetilide  failure. -Transition IV to PO amio 400 mg BID for 10 days, then 200 mg daily.  # Coronary artery disease s/p stent to RCA (10/2020) # Hypertension # Hyperlipidemia Troponin  negative x 2. EKG without acute ischemic changes. -Continue home aspirin  81 mg, atorvastatin  40 mg daily. -Continue home Imdur  30 mg daily. -Continue home lisinopril  5 mg daily. -Not on BB due to hx bradycardia. Patient was taking metoprolol  prn for when she felt she was in AF RVR.   Ok for discharge today from a cardiac perspective.  Follow-up scheduled with Dr. Wilburn on 08/14 at 2:45 PM.  Outpatient echocardiogram scheduled for 08/14 at 1 PM at [facility] cardiology.   This patient's plan of care was discussed and created with Dr. Wilburn and he is in agreement.  Signed: Dorene Comfort, PA-C  05/24/2024, 10:15 AM [facility] Cardiology

## 2024-05-24 NOTE — Evaluation (Signed)
 Occupational Therapy Evaluation Patient Details Name: Maria Trujillo MRN: [id number] DOB: 26-Jun-1934 Today's Date: 05/24/2024   History of Present Illness   Pt admitted for Afib with RVR with complaints of chest palpatations and syncope. History includes Afib, HTN, HLD, CAD, and glacoma.     Clinical Impressions Ms. Thon presents with generalized weakness after presenting to ER with Afib with RVR. Pt cleared by MD to participate in OT evaluation. Pt reports that, PTA, she was IND in B/IADL, living alone and managing her household INDly, driving PRN, ambulating largely w/o AD. She reports no falls history; no pain this date. Pt appears to be near or at her baseline level of fxl mobility, no further OT services required at this time.   If plan is discharge home, recommend the following:         Functional Status Assessment   Patient has not had a recent decline in their functional status     Equipment Recommendations   None recommended by OT     Recommendations for Other Services         Precautions/Restrictions   Precautions Precautions: Fall Restrictions Weight Bearing Restrictions Per Provider Order: No     Mobility Bed Mobility               General bed mobility comments: NT    Transfers Overall transfer level: Needs assistance   Transfers: Sit to/from Stand Sit to Stand: Supervision                  Balance Overall balance assessment: Needs assistance Sitting-balance support: Feet supported Sitting balance-Leahy Scale: Good     Standing balance support: No upper extremity supported Standing balance-Leahy Scale: Good                             ADL either performed or assessed with clinical judgement   ADL Overall ADL's : At baseline                                             Vision         Perception         Praxis         Pertinent Vitals/Pain Pain Assessment Pain Assessment:  No/denies pain     Extremity/Trunk Assessment Upper Extremity Assessment Upper Extremity Assessment: Overall WFL for tasks assessed   Lower Extremity Assessment Lower Extremity Assessment: Overall WFL for tasks assessed       Communication Communication Communication: No apparent difficulties   Cognition Arousal: Alert Behavior During Therapy: WFL for tasks assessed/performed                                 Following commands: Intact       Cueing  General Comments          Exercises     Shoulder Instructions      Home Living Family/patient expects to be discharged to:: Private residence Living Arrangements: Alone Available Help at Discharge: Family;Available PRN/intermittently Type of Home: House Home Access: Ramped entrance     Home Layout: One level     Bathroom Shower/Tub: Estate manager/land agent Accessibility: Yes   Home Equipment: Shower seat - built in;Cane - single point  Additional Comments: daughter is nurse and plans to stay with her through the weekend      Prior Functioning/Environment Prior Level of Function : Independent/Modified Independent             Mobility Comments: indep, ambulates without AD in home, uses [facility] for longer distances in community setting. no falls hx ADLs Comments: indep    OT Problem List: Decreased activity tolerance   OT Treatment/Interventions:        OT Goals(Current goals can be found in the care plan section)       OT Frequency:       Co-evaluation              AM-PAC OT 6 Clicks Daily Activity     Outcome Measure Help from another person eating meals?: None Help from another person taking care of personal grooming?: None Help from another person toileting, which includes using toliet, bedpan, or urinal?: None Help from another person bathing (including washing, rinsing, drying)?: None Help from another person to put on and taking off regular upper body  clothing?: None Help from another person to put on and taking off regular lower body clothing?: None 6 Click Score: 24   End of Session    Activity Tolerance: Patient tolerated treatment well Patient left: in chair;with nursing/sitter in room;Other (comment) (with MD present)  OT Visit Diagnosis: Muscle weakness (generalized) (M62.81)                Time: 8944-8887 OT Time Calculation (min): 17 min Charges:  OT General Charges $OT Visit: 1 Visit OT Evaluation $OT Eval Low Complexity: 1 Low Suzen Hock, PhD, MS, OTR/L 05/24/24, 11:58 AM

## 2024-05-24 NOTE — Progress Notes (Signed)
 OT Cancellation Note  Patient Details Name: ARNOLD DEPINTO MRN: [id number] DOB: 1934-08-04   Cancelled Treatment:    Reason Eval/Treat Not Completed: Patient not medically ready;Patient at procedure or test/ unavailable. Pt pending TEE this date, currently NPO on amio drip. Will complete OT evaluation at a later time/date, as pt is available and medically appropriate.   Suzen Hock 05/24/2024, 9:53 AM

## 2024-06-08 DIAGNOSIS — I480 Paroxysmal atrial fibrillation: Secondary | ICD-10-CM | POA: Diagnosis not present

## 2024-06-08 DIAGNOSIS — I2510 Atherosclerotic heart disease of native coronary artery without angina pectoris: Secondary | ICD-10-CM | POA: Diagnosis not present

## 2024-06-08 DIAGNOSIS — I4819 Other persistent atrial fibrillation: Secondary | ICD-10-CM | POA: Diagnosis not present

## 2024-06-08 DIAGNOSIS — I10 Essential (primary) hypertension: Secondary | ICD-10-CM | POA: Diagnosis not present

## 2024-06-22 DIAGNOSIS — Z885 Allergy status to narcotic agent status: Secondary | ICD-10-CM | POA: Diagnosis not present

## 2024-06-22 DIAGNOSIS — I2510 Atherosclerotic heart disease of native coronary artery without angina pectoris: Secondary | ICD-10-CM | POA: Diagnosis not present

## 2024-06-22 DIAGNOSIS — Z79890 Hormone replacement therapy (postmenopausal): Secondary | ICD-10-CM | POA: Diagnosis not present

## 2024-06-22 DIAGNOSIS — T859XXA Unspecified complication of internal prosthetic device, implant and graft, initial encounter: Secondary | ICD-10-CM | POA: Diagnosis not present

## 2024-06-22 DIAGNOSIS — Z79899 Other long term (current) drug therapy: Secondary | ICD-10-CM | POA: Diagnosis not present

## 2024-06-22 DIAGNOSIS — Z7982 Long term (current) use of aspirin: Secondary | ICD-10-CM | POA: Diagnosis not present

## 2024-06-22 DIAGNOSIS — Z7901 Long term (current) use of anticoagulants: Secondary | ICD-10-CM | POA: Diagnosis not present

## 2024-06-22 DIAGNOSIS — Z882 Allergy status to sulfonamides status: Secondary | ICD-10-CM | POA: Diagnosis not present

## 2024-06-22 DIAGNOSIS — K219 Gastro-esophageal reflux disease without esophagitis: Secondary | ICD-10-CM | POA: Diagnosis not present

## 2024-06-22 DIAGNOSIS — Z880 Allergy status to penicillin: Secondary | ICD-10-CM | POA: Diagnosis not present

## 2024-06-22 DIAGNOSIS — H401113 Primary open-angle glaucoma, right eye, severe stage: Secondary | ICD-10-CM | POA: Diagnosis not present

## 2024-06-22 DIAGNOSIS — E039 Hypothyroidism, unspecified: Secondary | ICD-10-CM | POA: Diagnosis not present

## 2024-06-22 DIAGNOSIS — I10 Essential (primary) hypertension: Secondary | ICD-10-CM | POA: Diagnosis not present

## 2024-06-22 DIAGNOSIS — I480 Paroxysmal atrial fibrillation: Secondary | ICD-10-CM | POA: Diagnosis not present

## 2024-06-22 DIAGNOSIS — T85398A Other mechanical complication of other ocular prosthetic devices, implants and grafts, initial encounter: Secondary | ICD-10-CM | POA: Diagnosis not present

## 2024-06-22 DIAGNOSIS — I252 Old myocardial infarction: Secondary | ICD-10-CM | POA: Diagnosis not present

## 2024-07-05 ENCOUNTER — Encounter: Payer: Self-pay | Admitting: *Deleted

## 2024-07-05 ENCOUNTER — Other Ambulatory Visit: Payer: Self-pay

## 2024-07-05 ENCOUNTER — Emergency Department

## 2024-07-05 ENCOUNTER — Emergency Department
Admission: EM | Admit: 2024-07-05 | Discharge: 2024-07-05 | Disposition: A | Discharge status: Home / Self Care | Attending: Emergency Medicine | Admitting: Emergency Medicine

## 2024-07-05 DIAGNOSIS — Z79899 Other long term (current) drug therapy: Secondary | ICD-10-CM | POA: Diagnosis not present

## 2024-07-05 DIAGNOSIS — R000 Tachycardia, unspecified: Secondary | ICD-10-CM | POA: Diagnosis not present

## 2024-07-05 DIAGNOSIS — Z7901 Long term (current) use of anticoagulants: Secondary | ICD-10-CM | POA: Insufficient documentation

## 2024-07-05 DIAGNOSIS — R002 Palpitations: Secondary | ICD-10-CM | POA: Diagnosis not present

## 2024-07-05 DIAGNOSIS — I10 Essential (primary) hypertension: Secondary | ICD-10-CM | POA: Diagnosis not present

## 2024-07-05 DIAGNOSIS — I491 Atrial premature depolarization: Secondary | ICD-10-CM | POA: Insufficient documentation

## 2024-07-05 DIAGNOSIS — I4891 Unspecified atrial fibrillation: Secondary | ICD-10-CM | POA: Insufficient documentation

## 2024-07-05 DIAGNOSIS — I440 Atrioventricular block, first degree: Secondary | ICD-10-CM | POA: Diagnosis not present

## 2024-07-05 DIAGNOSIS — I700 Atherosclerosis of aorta: Secondary | ICD-10-CM | POA: Diagnosis not present

## 2024-07-05 LAB — BASIC METABOLIC PANEL WITH GFR
Anion gap: 8 (ref 5–15)
BUN: 15 mg/dL (ref 8–23)
CO2: 28 mmol/L (ref 22–32)
Calcium: 9.5 mg/dL (ref 8.9–10.3)
Chloride: 105 mmol/L (ref 98–111)
Creatinine, Ser: 0.76 mg/dL (ref 0.44–1.00)
GFR, Estimated: >60 mL/min (ref >60)
Glucose, Bld: 117 mg/dL — ABNORMAL HIGH (ref 70–99)
Potassium: 4.3 mmol/L (ref 3.5–5.1)
Sodium: 141 mmol/L (ref 135–145)

## 2024-07-05 LAB — BRAIN NATRIURETIC PEPTIDE: B Natriuretic Peptide: 746.6 pg/mL — ABNORMAL HIGH (ref 0.0–100.0)

## 2024-07-05 LAB — CBC
HCT: 37.1 % (ref 36.0–46.0)
Hemoglobin: 12.1 g/dL (ref 12.0–15.0)
MCH: 30.3 pg (ref 26.0–34.0)
MCHC: 32.6 g/dL (ref 30.0–36.0)
MCV: 93 fL (ref 80.0–100.0)
Platelets: 185 K/uL (ref 150–400)
RBC: 3.99 MIL/uL (ref 3.87–5.11)
RDW: 13.2 % (ref 11.5–15.5)
WBC: 6.9 K/uL (ref 4.0–10.5)
nRBC: 0 % (ref 0.0–0.2)

## 2024-07-05 LAB — TROPONIN I (HIGH SENSITIVITY)
Troponin I (High Sensitivity): 12 ng/L (ref <18)
Troponin I (High Sensitivity): 12 ng/L (ref <18)

## 2024-07-05 LAB — PROTIME-INR
INR: 1.2 (ref 0.8–1.2)
Prothrombin Time: 16.4 s — ABNORMAL HIGH (ref 11.4–15.2)

## 2024-07-05 MED ORDER — SODIUM CHLORIDE 0.9 % IV BOLUS
1000.0000 mL | Freq: Once | INTRAVENOUS | Status: AC
  Administered 2024-07-05: 1000 mL via INTRAVENOUS

## 2024-07-05 NOTE — Discharge Instructions (Signed)
 Please see your cardiologist for your appointment tomorrow.  Please take metoprolol  as needed for palpitations.  Please make sure to keep yourself hydrated.  Please return if you have worsening palpitations, lightheadedness, if you pass out or have any chest pain or shortness of breath or if your heart rate goes above 120s.

## 2024-07-05 NOTE — ED Triage Notes (Signed)
 Pt to triage via wheelchair.  Pt has rapid heart rate all day.  Denies chest pain or sob.  Pt took metoprolol  x2 today.   No n/v  pt alert  speech clear.  Hx afib.

## 2024-07-05 NOTE — ED Provider Notes (Signed)
 Maria Trujillo Provider Note    Event Date/Time   First MD Initiated Contact with Patient 07/05/24 2149     (approximate)   History   Tachycardia   HPI  Maria Trujillo is a 89 y.o. female with history of atrial fibrillation on amiodarone , metoprolol , Eliquis , history of hypertension, presenting with tachycardia.  She denies any chest pain or shortness of breath, took metoprolol  twice a day.  No nausea vomiting or any urinary symptoms, no lightheadedness or dizziness.  No falls or trauma.  On independent chart review, she was admitted in July for A-fib with RVR, has failed cardioversion in the past, sees outpatient electrophysiology at [facility] she required IV Dilt with improvement to heart rates to the 90s to 100s.  Was given IV amiodarone  with conversion to sinus rhythm.       Physical Exam   Triage Vital Signs: ED Triage Vitals  Encounter Vitals Group     BP 07/05/24 1847 106/72     Girls Systolic BP Percentile --      Girls Diastolic BP Percentile --      Boys Systolic BP Percentile --      Boys Diastolic BP Percentile --      Pulse Rate 07/05/24 1847 (!) 111     Resp 07/05/24 1847 18     Temp 07/05/24 1847 98.1 F (36.7 C)     Temp Source 07/05/24 1847 Oral     SpO2 07/05/24 1847 95 %     Weight 07/05/24 1844 158 lb 11.7 oz (72 kg)     Height 07/05/24 1844 5' 4 (1.626 m)     Head Circumference --      Peak Flow --      Pain Score 07/05/24 1844 0     Pain Loc --      Pain Education --      Exclude from Growth Chart --     Most recent vital signs: Vitals:   07/05/24 2254 07/05/24 2300  BP:  136/85  Pulse: 83 100  Resp: 14 14  Temp:    SpO2: 99% 99%     General: Awake, no distress.  CV:  Good peripheral perfusion.  Resp:  Normal effort.  No tachypnea or respiratory distress Abd:  No distention.  Soft nontender Other:  No lower extremity edema   ED Results / Procedures / Treatments   Labs (all labs ordered are listed, but only  abnormal results are displayed) Labs Reviewed  BASIC METABOLIC PANEL WITH GFR - Abnormal; Notable for the following components:      Result Value   Glucose, Bld 117 (*)    All other components within normal limits  PROTIME-INR - Abnormal; Notable for the following components:   Prothrombin Time 16.4 (*)    All other components within normal limits  BRAIN NATRIURETIC PEPTIDE - Abnormal; Notable for the following components:   B Natriuretic Peptide 746.6 (*)    All other components within normal limits  CBC  TROPONIN I (HIGH SENSITIVITY)  TROPONIN I (HIGH SENSITIVITY)     EKG  EKG shows, atrial fibrillation with RVR, rate 104, normal QRS, normal QTc, no obvious ischemic ST elevation, baseline is wandering, T wave flattening in aVL, this is changed compared to prior since prior EKG shows sinus bradycardia.   RADIOLOGY On my independent interpretation, chest x-ray without obvious consolidation   PROCEDURES:  Critical Care performed: No  Procedures   MEDICATIONS ORDERED IN ED: Medications  sodium chloride  0.9 % bolus 1,000 mL (1,000 mLs Intravenous New Bag/Given 07/05/24 2214)     IMPRESSION / MDM / ASSESSMENT AND PLAN / ED COURSE  I reviewed the triage vital signs and the nursing notes.                              Differential diagnosis includes, but is not limited to, arrhythmia, electrolyte derangements, atypical ACS, dehydration.  Labs, EKG, troponin, chest x-ray.  Will give her some fluids here.  Patient's presentation is most consistent with acute presentation with potential threat to life or bodily function.  Independent interpretation of labs and imaging below.  Patient was observed in the emergency department, heart rate has been in the 90s to low 100s, she is asymptomatic on reassessment.  Discussed with her about imaging and lab results, she has a cardiologist appointment tomorrow, instructed her to continue her metoprolol  as needed, and to follow-up with her  cardiologist tomorrow.  Strict return precautions given.  Otherwise considered but no indication for inpatient admission at this time, she safe for outpatient management.  Will discharge with strict turn precautions.  Shared decision making done with patient and family and they are agreeable with this plan.  Discharge.  The patient is on the cardiac monitor to evaluate for evidence of arrhythmia and/or significant heart rate changes.   Clinical Course as of 07/05/24 2304  Wed Jul 05, 2024  2150 DG Chest 2 View 1. No acute findings.  [TT]  2156 Independent review of labs, electrolytes not severely deranged, no leukocytosis, initial troponin is negative, INR is not elevated. [TT]  2218 Troponin I (High Sensitivity) Troponin x 2 is negative. [TT]  2233 B Natriuretic Peptide(!): 746.6 Elevated but patient is not short of breath at this time, chest x-ray without pulmonary edema. [TT]  2239 Heart rates have been in the 90s to low 100s. [TT]    Clinical Course User Index [TT] Waymond, Lorelle Cummins, MD     FINAL CLINICAL IMPRESSION(S) / ED DIAGNOSES   Final diagnoses:  Atrial fibrillation, unspecified type [facility])  Palpitations     Rx / DC Orders   ED Discharge Orders     None        Note:  This document was prepared using Dragon voice recognition software and may include unintentional dictation errors.    Waymond Lorelle Cummins, MD 07/05/24 [phone]

## 2024-07-06 ENCOUNTER — Other Ambulatory Visit: Payer: Self-pay

## 2024-07-06 ENCOUNTER — Emergency Department
Admission: EM | Admit: 2024-07-06 | Discharge: 2024-07-07 | Disposition: A | Discharge status: Home / Self Care | Attending: Emergency Medicine | Admitting: Emergency Medicine

## 2024-07-06 DIAGNOSIS — I499 Cardiac arrhythmia, unspecified: Secondary | ICD-10-CM | POA: Diagnosis not present

## 2024-07-06 DIAGNOSIS — I4891 Unspecified atrial fibrillation: Secondary | ICD-10-CM | POA: Diagnosis not present

## 2024-07-06 DIAGNOSIS — R55 Syncope and collapse: Secondary | ICD-10-CM | POA: Insufficient documentation

## 2024-07-06 DIAGNOSIS — R112 Nausea with vomiting, unspecified: Secondary | ICD-10-CM | POA: Insufficient documentation

## 2024-07-06 DIAGNOSIS — I2510 Atherosclerotic heart disease of native coronary artery without angina pectoris: Secondary | ICD-10-CM | POA: Diagnosis not present

## 2024-07-06 DIAGNOSIS — R404 Transient alteration of awareness: Secondary | ICD-10-CM | POA: Diagnosis not present

## 2024-07-06 DIAGNOSIS — I10 Essential (primary) hypertension: Secondary | ICD-10-CM | POA: Diagnosis not present

## 2024-07-06 DIAGNOSIS — I480 Paroxysmal atrial fibrillation: Secondary | ICD-10-CM | POA: Diagnosis not present

## 2024-07-06 LAB — CBC
HCT: 34.2 % — ABNORMAL LOW (ref 36.0–46.0)
Hemoglobin: 11 g/dL — ABNORMAL LOW (ref 12.0–15.0)
MCH: 30.6 pg (ref 26.0–34.0)
MCHC: 32.2 g/dL (ref 30.0–36.0)
MCV: 95.3 fL (ref 80.0–100.0)
Platelets: 159 K/uL (ref 150–400)
RBC: 3.59 MIL/uL — ABNORMAL LOW (ref 3.87–5.11)
RDW: 13.6 % (ref 11.5–15.5)
WBC: 6.9 K/uL (ref 4.0–10.5)
nRBC: 0 % (ref 0.0–0.2)

## 2024-07-06 LAB — COMPREHENSIVE METABOLIC PANEL WITH GFR
ALT: 16 U/L (ref 0–44)
AST: 24 U/L (ref 15–41)
Albumin: 3 g/dL — ABNORMAL LOW (ref 3.5–5.0)
Alkaline Phosphatase: 69 U/L (ref 38–126)
Anion gap: 10 (ref 5–15)
BUN: 19 mg/dL (ref 8–23)
CO2: 23 mmol/L (ref 22–32)
Calcium: 8.4 mg/dL — ABNORMAL LOW (ref 8.9–10.3)
Chloride: 108 mmol/L (ref 98–111)
Creatinine, Ser: 0.94 mg/dL (ref 0.44–1.00)
GFR, Estimated: 58 mL/min — ABNORMAL LOW (ref >60)
Glucose, Bld: 107 mg/dL — ABNORMAL HIGH (ref 70–99)
Potassium: 4 mmol/L (ref 3.5–5.1)
Sodium: 141 mmol/L (ref 135–145)
Total Bilirubin: 0.7 mg/dL (ref 0.0–1.2)
Total Protein: 5.5 g/dL — ABNORMAL LOW (ref 6.5–8.1)

## 2024-07-06 LAB — TROPONIN I (HIGH SENSITIVITY)
Troponin I (High Sensitivity): 9 ng/L (ref <18)
Troponin I (High Sensitivity): 9 ng/L (ref <18)

## 2024-07-06 MED ORDER — SODIUM CHLORIDE 0.9 % IV BOLUS
500.0000 mL | Freq: Once | INTRAVENOUS | Status: AC
  Administered 2024-07-06: 500 mL via INTRAVENOUS

## 2024-07-06 NOTE — ED Triage Notes (Signed)
 Pt arrives via ems after syncopal episode at home, pt was sitting at table and went out; denies hitting head. Pt seen here yesterday for afib RVR and increased her metoprolol  dose. Pt had syncopal episode tonight 1 hour after taking her metoprolol . Pt denies cp or sob, endorses weakness

## 2024-07-06 NOTE — ED Provider Notes (Signed)
 [facility] Provider Note    Event Date/Time   First MD Initiated Contact with Patient 07/06/24 2132     (approximate)  History   Chief Complaint: Loss of Consciousness  HPI  Maria Trujillo is a 89 y.o. female with a past medical history of arthritis, atrial fibrillation, prior MI, presents to the emergency department after syncopal episode.  According to the patient she was seen here yesterday, per chart review patient was given amiodarone  for A-fib RVR and ultimately converted back to her normal sinus rhythm was discharged home.  Patient followed up with her cardiologist Dr. Wilburn this morning.  He increase the patient's metoprolol  from 12.5 mg as needed for heart rate greater than 100, to now 37.5 mg twice daily.  Patient took her first dose of 37.5 mg tonight and approximately 1 hour later while sitting at a table she had a brief loss of consciousness with nausea and 1 episode of vomiting.  Here patient states feeling better initial heart rate around 50 bpm.  Daughter is here with the patient who states the patient's heart rate usually is around 55 to 60 bpm.  Patient did have a Holter monitor placed this morning which she is currently wearing but we are unable to obtain results from this.  Physical Exam   Triage Vital Signs: ED Triage Vitals [07/06/24 2118]  Encounter Vitals Group     BP 115/60     Girls Systolic BP Percentile      Girls Diastolic BP Percentile      Boys Systolic BP Percentile      Boys Diastolic BP Percentile      Pulse Rate (!) 52     Resp 14     Temp (!) 97.5 F (36.4 C)     Temp Source Oral     SpO2 100 %     Weight      Height      Head Circumference      Peak Flow      Pain Score 0     Pain Loc      Pain Education      Exclude from Growth Chart     Most recent vital signs: Vitals:   07/06/24 2118  BP: 115/60  Pulse: (!) 52  Resp: 14  Temp: (!) 97.5 F (36.4 C)  SpO2: 100%    General: Awake, no distress.   CV:  Good peripheral perfusion.  Regular regular rhythm rate around 60 bpm Resp:  Normal effort.  Equal breath sounds bilaterally.  Abd:  No distention.  Soft, nontender.  No rebound or guarding.   ED Results / Procedures / Treatments   EKG  EKG viewed and interpreted by myself shows a normal sinus rhythm at 50 bpm with a narrow QRS, normal axis, borderline PR prolongation otherwise normal intervals with no concerning ST changes.  MEDICATIONS ORDERED IN ED: Medications  sodium chloride  0.9 % bolus 500 mL (has no administration in time range)     IMPRESSION / MDM / ASSESSMENT AND PLAN / ED COURSE  I reviewed the triage vital signs and the nursing notes.  Patient's presentation is most consistent with acute presentation with potential threat to life or bodily function.  Patient presents to the emergency department for nausea and a syncopal episode tonight.  Patient took her first dose of 37.5 mg of metoprolol  approximately 1 hour prior to her symptoms possibly indicating the metoprolol  to be the cause of the patient's syncopal  episode possibly from bradycardia.  Patient's heart rate currently in the low 50s, daughter states normally in the mid 71s.  Patient denies any chest pain at any point.  Will IV hydrate the patient 500 cc of fluid.  Will check labs including troponin x 2 we will continue to closely monitor.  If the patient's workup is negative and the patient continues to appear well patient could potentially be discharged home discontinuing her 37.5 metoprolol  and returning back to her 12.5 dose until she can see her cardiologist again.  If the patient feels weak or abnormal workup patient may require admission.  Lab work is so far reassuring with a reassuring chemistry with normal renal function, reassuring CBC with a normal white blood cell count and normal H&H.  Troponin is negative.  Will repeat a troponin after 2 hours.  Patient receiving IV hydration.  Heart rate remains around  60 blood pressure remains well.  Patient's repeat troponin remains negative and the patient continues to appear well we will potentially discharge patient home restart metoprolol  12.5 instead of 37.5 and have her follow-up with her cardiologist first thing in the morning.  FINAL CLINICAL IMPRESSION(S) / ED DIAGNOSES   Syncope  Note:  This document was prepared using Dragon voice recognition software and may include unintentional dictation errors.   Dorothyann Drivers, MD 07/06/24 2258

## 2024-07-06 NOTE — ED Notes (Signed)
Green top sent to lab

## 2024-07-06 NOTE — Discharge Instructions (Addendum)
 Please discontinue your metoprolol  37.5 and resume taking your previously dosed 12.5 mg metoprolol .  Please follow-up with cardiology first thing in the morning to discuss your syncopal episode today.  Return to the emergency department for any further syncopal episodes, any chest pain or any other symptom personally concerning to yourself.

## 2024-07-07 NOTE — ED Provider Notes (Addendum)
 Signed out to me pending the second troponin, plan for if normal discharge with reduction in medication dosage as this was thought to be contributing to her syncope episode.  Troponin flat, patient stable for discharge.   Cyrena Mylar, MD 07/07/24 9972    Cyrena Mylar, MD 07/07/24 GLORIANNE

## 2024-07-09 NOTE — Progress Notes (Signed)
 Electrophysiology Clinic Note    Date:  07/10/2024  Patient ID:  Maria, Trujillo Jan 01, 1934, MRN [id number] PCP:  Jeffie Cheryl BRAVO, MD  Cardiologist:  Marsa Dooms, MD   Electrophysiologist:  OLE ONEIDA HOLTS, MD  Electrophysiology APP:  Torey Reinard, NP     Discussed the use of AI scribe software for clinical note transcription with the patient, who gave verbal consent to proceed.   Patient Profile    Chief Complaint: AFib  History of Present Illness: Maria Trujillo is a 89 y.o. female with PMH notable for persis Afib, PAC, CAD, HTN ; seen today for OLE ONEIDA HOLTS, MD for routine electrophysiology followup.   She is s/p tikosyn  load 7/29-05/28/23. While loading she converted to NSR. Her BB was stopped inpatient d/t bradycardia. Quickly after tikosyn  loading, she did have paroxysms of afib. I have seen her regularly in follow-up and she has continued to have rare AFib episodes and tikosyn  continued. I last saw her 02/2024.  Since that time, she has been eval'd in ER x 4 for AFib. [facility] cards stopped tikosyn  and started amiodarone  ~04/2024  She saw [facility] Dr. Wilburn 9/11 for hosp follow-up, was in afib during appt with ventricular rate in 90s, increased metop from 12.5mg  PRN > 37.5 BID and ordered ambulatory monitor. She presented to ER after visit for syncopal event after taking metop. BB was stopped in ER.   On follow-up today, she is fairly sure she converted to sinus rhythm and then in the hour or so after being in sinus rhythm, felt dizzy, LH, presyncopal before syncopizing. Historically, she can feel when she converts from AFib > sinus rhythm.  She was wearing her ambulatory monitor during the episode. Her daughter recalls her BP before syncope was 60s/40s with normal pulse in 50s Her daughter joins for appt, who is worried that she will continue to have syncopal events.  She is taking 200mg  amiodarone  daily without off-target effects Continues to take eliquis  BID, no  bleeding concerns.     Arrhythmia/Device History Tikosyn  - ineffective Amiodarone  - current    ROS:  Please see the history of present illness. All other systems are reviewed and otherwise negative.    Physical Exam    VS:  BP (!) 154/64 (BP Location: Left Arm, Patient Position: Sitting, Cuff Size: Normal)   Pulse 67   Resp 19   Ht 5' 4 (1.626 m)   Wt 167 lb (75.8 kg)   SpO2 97%   BMI 28.67 kg/m  BMI: Body mass index is 28.67 kg/m.      Wt Readings from Last 3 Encounters:  07/10/24 167 lb (75.8 kg)  07/05/24 158 lb 11.7 oz (72 kg)  05/23/24 160 lb (72.6 kg)     GEN- The patient is well appearing, alert and oriented x 3 today.   Lungs- Clear to ausculation bilaterally, normal work of breathing.  Heart- Regular rate and rhythm, no murmurs, rubs or gallops Extremities- Trace peripheral edema, warm, dry    Studies Reviewed   Previous EP, cardiology notes.    EKG is not ordered. Personal review of EKG from 07/06/2024 shows:  SR with rare PAC, 1st deg HB; rate 50        LHC, 11/04/2020 3rd RPL lesion is 100% stenosed. 1.  Non-ST elevation myocardial infarction 2.  Occluded small caliber RPL 3 3.  Normal left ventricular function   TTE, 11/03/2020  1. Left ventricular ejection fraction, by estimation, is 55 to  60%. The left ventricle has normal function. The left ventricle has no regional wall motion abnormalities. Left ventricular diastolic parameters were normal.   2. Right ventricular systolic function is normal. The right ventricular size is normal.   3. The mitral valve is normal in structure. Mild to moderate mitral valve regurgitation. No evidence of mitral stenosis.   4. Tricuspid valve regurgitation is mild to moderate.   5. The aortic valve is normal in structure. Aortic valve regurgitation is not visualized. No aortic stenosis is present.   6. The inferior vena cava is normal in size with greater than 50% respiratory variability, suggesting right atrial  pressure of 3 mmHg.    Assessment and Plan     #) persis AFib #) syncope #) bradycardia Recently initiated amiodarone  04/2024 after tikosyn  ineffective Continues to have AFib episode Single syncopal event last week after BB adjusted from 12.5 PRN > 37.5 BID. Administration of higher BB dose appears to correlate with converting to SB and hypotension. Gen cards ordered ambulatory monitor, consider lower BB dose vs PPM based on results  #) Hypercoag d/t persis afib CHA2DS2-VASc Score = at least 5 [CHF History: 0, HTN History: 1, Diabetes History: 0, Stroke History: 0, Vascular Disease History: 1, Age Score: 2, Gender Score: 1].  Therefore, the patient's annual risk of stroke is 7.2 %.    Stroke ppx - 5mg  eliquis  BID, appropriately dosed No bleeding concerns         Current medicines are reviewed at length with the patient today.   The patient has concerns regarding her medicines.  The following changes were made today:  none  Labs/ tests ordered today include:  No orders of the defined types were placed in this encounter.    Disposition: Patient will continue to follow-up with [facility].  Follow up with EP Team PRN    Signed, Kamya Watling, NP  07/10/24  11:36 AM  Electrophysiology [facility]

## 2024-07-10 ENCOUNTER — Telehealth: Payer: Self-pay | Admitting: Emergency Medicine

## 2024-07-10 ENCOUNTER — Ambulatory Visit: Attending: Cardiology | Admitting: Cardiology

## 2024-07-10 ENCOUNTER — Encounter: Payer: Self-pay | Admitting: Cardiology

## 2024-07-10 VITALS — BP 154/64 | HR 67 | Resp 19 | Ht 64.0 in | Wt 167.0 lb

## 2024-07-10 DIAGNOSIS — I4819 Other persistent atrial fibrillation: Secondary | ICD-10-CM

## 2024-07-10 DIAGNOSIS — R001 Bradycardia, unspecified: Secondary | ICD-10-CM | POA: Diagnosis not present

## 2024-07-10 DIAGNOSIS — R55 Syncope and collapse: Secondary | ICD-10-CM | POA: Diagnosis not present

## 2024-07-10 DIAGNOSIS — D6869 Other thrombophilia: Secondary | ICD-10-CM | POA: Diagnosis not present

## 2024-07-10 NOTE — Telephone Encounter (Signed)
-----   Message from Suzann Riddle sent at 07/09/2024 11:49 AM EDT ----- Regarding: Afib, [facility] appt last week I am scheduled to see this patient Monday morning for afib follow-up. She just saw [facility] late last week for same complaint, they've transitioned her from tikosyn  > amiodarone  and ordered a 3 day monitor. It doesn't seem like she needs to see both groups for AFib management.  Please call her to check in, I am happy to see her, but would also be OK to only see [facility].   Thanks, Suzann

## 2024-07-10 NOTE — Telephone Encounter (Signed)
 Called and spoke with the patient.  Patient states that she would like to keep the appointment today with Suzann as her daughter has some questions, and they will decide what they want to do after the appointment.

## 2024-07-10 NOTE — Patient Instructions (Signed)
 Medication Instructions:  Your physician recommends that you continue on your current medications as directed. Please refer to the Current Medication list given to you today.   *If you need a refill on your cardiac medications before your next appointment, please call your pharmacy*  Lab Work: None ordered at this time  If you have labs (blood work) drawn today and your tests are completely normal, you will receive your results only by: MyChart Message (if you have MyChart) OR A paper copy in the mail If you have any lab test that is abnormal or we need to change your treatment, we will call you to review the results.  Testing/Procedures: None ordered at this time   Follow-Up: At [facility], you and your health needs are our priority.  As part of our continuing mission to provide you with exceptional heart care, our providers are all part of one team.  This team includes your primary Cardiologist (physician) and Advanced Practice Providers or APPs (Physician Assistants and Nurse Practitioners) who all work together to provide you with the care you need, when you need it.  Your next appointment:   As Needed  Provider:   You may see Maria ONEIDA HOLTS, MD or one of the following Advanced Practice Providers on your designated Care Team:   Charlies Arthur, [place] Ozell Jodie Passey, PA-C Suzann Riddle, NP Daphne Barrack, NP Artist Pouch, PA-C     We recommend signing up for the patient portal called MyChart.  Sign up information is provided on this After Visit Summary.  MyChart is used to connect with patients for Virtual Visits (Telemedicine).  Patients are able to view lab/test results, encounter notes, upcoming appointments, etc.  Non-urgent messages can be sent to your provider as well.   To learn more about what you can do with MyChart, go to [email or web address].

## 2024-07-17 DIAGNOSIS — I480 Paroxysmal atrial fibrillation: Secondary | ICD-10-CM | POA: Diagnosis not present

## 2024-07-21 ENCOUNTER — Ambulatory Visit: Attending: Cardiology | Admitting: Cardiology

## 2024-07-21 ENCOUNTER — Encounter: Payer: Self-pay | Admitting: Cardiology

## 2024-07-21 VITALS — BP 112/58 | HR 59 | Ht 64.0 in | Wt 164.2 lb

## 2024-07-21 DIAGNOSIS — I495 Sick sinus syndrome: Secondary | ICD-10-CM

## 2024-07-21 DIAGNOSIS — I4819 Other persistent atrial fibrillation: Secondary | ICD-10-CM

## 2024-07-21 DIAGNOSIS — D6869 Other thrombophilia: Secondary | ICD-10-CM

## 2024-07-21 DIAGNOSIS — R55 Syncope and collapse: Secondary | ICD-10-CM

## 2024-07-21 NOTE — Patient Instructions (Signed)
 Medication Instructions:  Your physician recommends that you continue on your current medications as directed. Please refer to the Current Medication list given to you today.  *If you need a refill on your cardiac medications before your next appointment, please call your pharmacy*  Lab Work: No labs ordered today  If you have labs (blood work) drawn today and your tests are completely normal, you will receive your results only by: MyChart Message (if you have MyChart) OR A paper copy in the mail If you have any lab test that is abnormal or we need to change your treatment, we will call you to review the results.  Testing/Procedures: ou are scheduled for a Permanent transvenous pacemaker (PPM) on Monday, September 29 with Dr. Ole Holts.   [facility] - Main Entrance A                                                                                                                                                                   [address]                                                                                                                                                                                         [place], [place] [address]   Arrival Time: 12:30 PM   Special note: Every effort is made to have your procedure done on time. Please understand that emergencies sometimes delay  scheduled procedures. ___________________________________________________________________    Follow-Up:  At [facility], you and your health needs are our priority.  As part of our continuing mission to provide you with exceptional heart care, our providers are all part of one team.  This team includes your primary Cardiologist (physician) and Advanced Practice Providers or APPs (Physician Assistants and Nurse Practitioners) who all work together to provide you with the care you need, when you need it.  Your next appointment:  You will follow up with the Device Clinic  10-14 days after your procedure.  You will follow up with Dr. Ole Holts or EP APP 91 days after your procedure. These appointments will be made for you.  Provider:   Ole Holts, MD    We recommend signing up for the patient portal called MyChart.  Sign up information is provided on this After Visit Summary.  MyChart is used to connect with patients for Virtual Visits (Telemedicine).  Patients are able to view lab/test results, encounter notes, upcoming appointments, etc.  Non-urgent messages can be sent to your provider as well.   To learn more about what you can do with MyChart, go to [email or web address].   Other Instructions  Please see Implantable Device instructions letter given to you today.

## 2024-07-21 NOTE — Progress Notes (Signed)
 Electrophysiology Clinic Note    Date:  07/21/2024  Patient ID:  Maria, Trujillo 1934-04-03, MRN [id number] PCP:  Jeffie Cheryl BRAVO, MD  Cardiologist:  Marsa Dooms, MD   Electrophysiologist:  OLE ONEIDA HOLTS, MD  Electrophysiology APP:  Zaylan Kissoon, NP     Discussed the use of AI scribe software for clinical note transcription with the patient, who gave verbal consent to proceed.   Patient Profile    Chief Complaint: sinus pause  History of Present Illness: Maria Trujillo is a 89 y.o. female with PMH notable for persis Afib, PAC, CAD, HTN ; seen today for OLE ONEIDA HOLTS, MD for routine electrophysiology followup.   She is s/p tikosyn  load 7/29-05/28/23. While loading she converted to NSR. Her BB was stopped inpatient d/t bradycardia. Quickly after tikosyn  loading, she did have paroxysms of afib. I have seen her regularly in follow-up and she has continued to have rare AFib episodes and tikosyn  continued.  Over the summer, she had considerable increase in AFib burden and was eval'd in ER x 4 for AFib. [facility] cards was consulted, and stopped tikosyn  and started amiodarone  ~04/2024.  She saw [facility] Dr. Wilburn 9/11 for hosp follow-up, was in afib during appt with ventricular rate in 90s, increased metop from 12.5mg  PRN > 37.5 BID and ordered ambulatory monitor. She presented to ER after visit for syncopal event after taking metop. She felt very unwell after taking metop - LH, dizzy, BP at home before syncope was very low. BB was stopped in ER. I saw her 9/12 in follow-up and she was feeling well during that appointment.   Her ambulatory monitor resulted yesterday that showed several day time pauses, longest 6 seconds, and she presents today to discuss PPM.  Interestingly, yesterday she had dizziness and felt off throughout the day, checked her BP and it was elevated. HR was stable in 50s throughout the day. She has not had any further syncopal episodes since 9/11  Continues  to take eliquis  BID, no bleeding concerns.     Arrhythmia/Device History Tikosyn  - ineffective Amiodarone  - current    ROS:  Please see the history of present illness. All other systems are reviewed and otherwise negative.    Physical Exam    VS:  BP (!) 112/58 (BP Location: Left Arm, Patient Position: Sitting, Cuff Size: Normal)   Pulse (!) 59   Ht 5' 4 (1.626 m)   Wt 164 lb 3.2 oz (74.5 kg)   SpO2 99%   BMI 28.18 kg/m  BMI: Body mass index is 28.18 kg/m.      Wt Readings from Last 3 Encounters:  07/21/24 164 lb 3.2 oz (74.5 kg)  07/10/24 167 lb (75.8 kg)  07/05/24 158 lb 11.7 oz (72 kg)     GEN- The patient is well appearing, alert and oriented x 3 today.   Lungs- Clear to ausculation bilaterally, normal work of breathing.  Heart- Regular rate and rhythm, no murmurs, rubs or gallops Extremities- Trace peripheral edema, warm, dry    Studies Reviewed   Previous EP, cardiology notes.    EKG is not ordered. Personal review of EKG from 07/06/2024 shows:  SR with rare PAC, 1st deg HB; rate 50        TTE, 06/08/2024 NORMAL LEFT VENTRICULAR SYSTOLIC FUNCTION WITH MILD LVH  ESTIMATED EF: 55%  NORMAL LA PRESSURES WITH DIASTOLIC DYSFUNCTION (GRADE 1)  NORMAL RIGHT VENTRICULAR SYSTOLIC FUNCTION  VALVULAR REGURGITATION: No AR, MILD MR, No  PR, MILD TR  ESTIMATED RVSP: 36 mmHg (Normal)  NO VALVULAR STENOSIS  Left atrium severely dilated.    LHC, 11/04/2020 3rd RPL lesion is 100% stenosed. 1.  Non-ST elevation myocardial infarction 2.  Occluded small caliber RPL 3 3.  Normal left ventricular function   TTE, 11/03/2020  1. Left ventricular ejection fraction, by estimation, is 55 to 60%. The left ventricle has normal function. The left ventricle has no regional wall motion abnormalities. Left ventricular diastolic parameters were normal.   2. Right ventricular systolic function is normal. The right ventricular size is normal.   3. The mitral valve is normal in  structure. Mild to moderate mitral valve regurgitation. No evidence of mitral stenosis.   4. Tricuspid valve regurgitation is mild to moderate.   5. The aortic valve is normal in structure. Aortic valve regurgitation is not visualized. No aortic stenosis is present.   6. The inferior vena cava is normal in size with greater than 50% respiratory variability, suggesting right atrial pressure of 3 mmHg.    Assessment and Plan     #) persis AFib #) syncope #) tachy-brady syndrome Recently initiated amiodarone  04/2024 after tikosyn  ineffective Continues to have AFib episode Syncopal event 9/11 corresponds to 6 second pause on cardiac monitor She meets criteria for PPM, anticipate dual chamber PPM.  We discussed risks/benefits for procedure, patient and daughter are agreeable to proceed.  In discussion with Dr. Cindie, will implant Bos Sci device, scheduled 9/29.    #) Hypercoag d/t persis afib CHA2DS2-VASc Score = at least 5 [CHF History: 0, HTN History: 1, Diabetes History: 0, Stroke History: 0, Vascular Disease History: 1, Age Score: 2, Gender Score: 1].  Therefore, the patient's annual risk of stroke is 7.2 %.    Stroke ppx - 5mg  eliquis  BID, appropriately dosed No bleeding concerns Hold eliquis  beginning with today (9/26) PM dose until after PPM implant.          Current medicines are reviewed at length with the patient today.   The patient has concerns regarding her medicines.  The following changes were made today:  none  Labs/ tests ordered today include:  No orders of the defined types were placed in this encounter.    Disposition: Follow up with EP Team as usual post procedure   Signed, Lindi Abram, NP  07/21/24  3:35 PM  Electrophysiology [facility]

## 2024-07-21 NOTE — Pre-Procedure Instructions (Signed)
 Attempted to call patient regarding procedure instructions.   Left voicemail on the following items: Arrival time 1200 Nothing to eat or drink after midnight No meds AM of procedure Responsible person to drive you home and stay with you for 24 hrs Wash with special soap night before and morning of procedure If on anti-coagulant drug instructions Eliquis - last dose 9/26

## 2024-07-24 ENCOUNTER — Ambulatory Visit (HOSPITAL_COMMUNITY)

## 2024-07-24 ENCOUNTER — Ambulatory Visit (HOSPITAL_COMMUNITY): Admission: RE | Disposition: A | Discharge status: Home / Self Care | Source: Home / Self Care | Attending: Cardiology

## 2024-07-24 ENCOUNTER — Other Ambulatory Visit: Payer: Self-pay

## 2024-07-24 ENCOUNTER — Ambulatory Visit (HOSPITAL_COMMUNITY)
Admission: RE | Admit: 2024-07-24 | Discharge: 2024-07-24 | Disposition: A | Discharge status: Home / Self Care | Attending: Cardiology | Admitting: Cardiology

## 2024-07-24 DIAGNOSIS — I252 Old myocardial infarction: Secondary | ICD-10-CM | POA: Insufficient documentation

## 2024-07-24 DIAGNOSIS — Z950 Presence of cardiac pacemaker: Secondary | ICD-10-CM | POA: Diagnosis not present

## 2024-07-24 DIAGNOSIS — I2510 Atherosclerotic heart disease of native coronary artery without angina pectoris: Secondary | ICD-10-CM | POA: Insufficient documentation

## 2024-07-24 DIAGNOSIS — Z79899 Other long term (current) drug therapy: Secondary | ICD-10-CM | POA: Insufficient documentation

## 2024-07-24 DIAGNOSIS — Z7901 Long term (current) use of anticoagulants: Secondary | ICD-10-CM | POA: Diagnosis not present

## 2024-07-24 DIAGNOSIS — I5032 Chronic diastolic (congestive) heart failure: Secondary | ICD-10-CM | POA: Diagnosis not present

## 2024-07-24 DIAGNOSIS — I495 Sick sinus syndrome: Secondary | ICD-10-CM | POA: Diagnosis not present

## 2024-07-24 DIAGNOSIS — I110 Hypertensive heart disease with heart failure: Secondary | ICD-10-CM | POA: Insufficient documentation

## 2024-07-24 DIAGNOSIS — R9389 Abnormal findings on diagnostic imaging of other specified body structures: Secondary | ICD-10-CM | POA: Diagnosis not present

## 2024-07-24 DIAGNOSIS — Z7982 Long term (current) use of aspirin: Secondary | ICD-10-CM | POA: Insufficient documentation

## 2024-07-24 DIAGNOSIS — E785 Hyperlipidemia, unspecified: Secondary | ICD-10-CM | POA: Diagnosis not present

## 2024-07-24 DIAGNOSIS — I4819 Other persistent atrial fibrillation: Secondary | ICD-10-CM | POA: Insufficient documentation

## 2024-07-24 DIAGNOSIS — I700 Atherosclerosis of aorta: Secondary | ICD-10-CM | POA: Diagnosis not present

## 2024-07-24 HISTORY — PX: PACEMAKER IMPLANT: EP1218

## 2024-07-24 SURGERY — PACEMAKER IMPLANT

## 2024-07-24 MED ORDER — SODIUM CHLORIDE 0.9 % IV SOLN
INTRAVENOUS | Status: AC
  Filled 2024-07-24: qty 2

## 2024-07-24 MED ORDER — CHLORHEXIDINE GLUCONATE 4 % EX SOLN
4.0000 | Freq: Once | CUTANEOUS | Status: DC
  Filled 2024-07-24: qty 60

## 2024-07-24 MED ORDER — LIDOCAINE HCL 1 % IJ SOLN
INTRAMUSCULAR | Status: AC
  Filled 2024-07-24: qty 60

## 2024-07-24 MED ORDER — FENTANYL CITRATE (PF) 100 MCG/2ML IJ SOLN
INTRAMUSCULAR | Status: AC
  Filled 2024-07-24: qty 2

## 2024-07-24 MED ORDER — POVIDONE-IODINE 10 % EX SWAB
2.0000 | Freq: Once | CUTANEOUS | Status: AC
  Administered 2024-07-24: 2 via TOPICAL

## 2024-07-24 MED ORDER — ONDANSETRON HCL 4 MG/2ML IJ SOLN: 4.0000 mg | Freq: Four times a day (QID) | INTRAMUSCULAR | Status: DC | PRN

## 2024-07-24 MED ORDER — ACETAMINOPHEN 325 MG PO TABS: 325.0000 mg | ORAL_TABLET | ORAL | Status: DC | PRN

## 2024-07-24 MED ORDER — VANCOMYCIN HCL IN DEXTROSE 1-5 GM/200ML-% IV SOLN
INTRAVENOUS | Status: AC
  Filled 2024-07-24: qty 200

## 2024-07-24 MED ORDER — VANCOMYCIN HCL IN DEXTROSE 1-5 GM/200ML-% IV SOLN
1000.0000 mg | INTRAVENOUS | Status: AC
  Administered 2024-07-24: 1000 mg via INTRAVENOUS

## 2024-07-24 MED ORDER — HEPARIN (PORCINE) IN NACL 1000-0.9 UT/500ML-% IV SOLN
INTRAVENOUS | Status: DC | PRN
  Administered 2024-07-24: 500 mL

## 2024-07-24 MED ORDER — MIDAZOLAM HCL 5 MG/5ML IJ SOLN
INTRAMUSCULAR | Status: DC | PRN
  Administered 2024-07-24: .5 mg via INTRAVENOUS

## 2024-07-24 MED ORDER — MIDAZOLAM HCL 2 MG/2ML IJ SOLN
INTRAMUSCULAR | Status: AC
  Filled 2024-07-24: qty 2

## 2024-07-24 MED ORDER — LIDOCAINE HCL (PF) 1 % IJ SOLN
INTRAMUSCULAR | Status: DC | PRN
  Administered 2024-07-24: 50 mL via INTRADERMAL

## 2024-07-24 MED ORDER — FENTANYL CITRATE (PF) 100 MCG/2ML IJ SOLN
INTRAMUSCULAR | Status: DC | PRN
  Administered 2024-07-24: 12.5 ug via INTRAVENOUS

## 2024-07-24 MED ORDER — SODIUM CHLORIDE 0.9 % IV SOLN
80.0000 mg | INTRAVENOUS | Status: AC
  Administered 2024-07-24: 80 mg

## 2024-07-24 MED ORDER — SODIUM CHLORIDE 0.9 % IV SOLN: INTRAVENOUS | Status: DC

## 2024-07-24 SURGICAL SUPPLY — 8 items
CABLE SURGICAL S-101-97-12 (CABLE) ×1 IMPLANT
LEAD INGEVITY 7840 45 (Lead) IMPLANT
LEAD INGEVITY 7841 52 (Lead) IMPLANT
PACEMAKER ACCOLADE DR-EL (Pacemaker) IMPLANT
PAD DEFIB RADIO PHYSIO CONN (PAD) ×1 IMPLANT
SHEATH 7FR PRELUDE SNAP 13 (SHEATH) IMPLANT
SHEATH PROBE COVER 6X72 (BAG) IMPLANT
TRAY PACEMAKER INSERTION (PACKS) ×1 IMPLANT

## 2024-07-24 NOTE — H&P (Signed)
 Electrophysiology Office Note:     Date:  07/24/2024    ID:  Maria Trujillo, DOB October 16, 1934, MRN [id number]   [facility] Cardiologist:  None  [facility] Electrophysiologist:  OLE ONEIDA HOLTS, MD    Referring MD: Maria Kung, PA-C    Chief Complaint: Atrial fibrillation   History of Present Illness:     Maria Trujillo is a 89 y.o. femalewho I am seeing today for an evaluation of atrial fibrillation at the request of Trujillo Clarisa, PA-C.   The patient was last seen by Trujillo on Mar 11, 2023.   The patient has a medical history that includes coronary artery disease, NSTEMI, hypertension, hyperlipidemia, persistent atrial fibrillation with cardioversion on Mar 04, 2023.  Her atrial fibrillation was diagnosed in 2022 on a heart monitor.  She had a long history of palpitations and PACs prior to that diagnosis being made.  The patient had a cardioversion on Mar 04, 2023.  She felt well for 1 week after the cardioversion but then developed recurrent palpitations.  EKG in Maria Trujillo's office showed atrial fibrillation with a rapid ventricular rate of 107 bpm.  She also had associated lower extremity edema.   She is with her daughter today in clinic.  The patient felt better when she was back in normal rhythm for several days before returning to atrial fibrillation.  Her daughter confirms.  She tolerates Eliquis  without bleeding issues.    Presents for PPM implant today. Procedure reviewed.     Objective Their past medical, social and family history was reveiwed.     ROS:   Please see the history of present illness.    All other systems reviewed and are negative.   EKGs/Labs/Other Studies Reviewed:     The following studies were reviewed today:   November 04, 2020 echo EF 55-60 RV normal Mild to moderate MR Mild to moderate TR   EKG Interpretation Date/Time:                  Wednesday April 28 2023 14:28:36 EDT Ventricular Rate:         107 PR Interval:                   QRS Duration:              78 QT Interval:                 318 QTC Calculation:424 R Axis:                         -6   Text Interpretation:Atrial fibrillation with rapid ventricular response Confirmed by HOLTS OLE [phone]) on 04/28/2023 3:03:38 PM      Physical Exam:     VS:  BP 156/56   Pulse 51   Ht 5' 4 (1.626 m)   Wt 177 lb (80.3 kg)   BMI 30.38 kg/m         Wt Readings from Last 3 Encounters:  04/28/23 177 lb (80.3 kg)  03/04/23 182 lb (82.6 kg)  11/18/22 175 lb (79.4 kg)      GEN:  Well nourished, well developed in no acute distress.  Appears younger than stated age CARDIAC: Irregularly irregular, no murmurs, rubs, gallops RESPIRATORY:  Clear to auscultation without rales, wheezing or rhonchi      Assessment ASSESSMENT AND PLAN:     1. Persistent atrial fibrillation (HCC)   2. Coronary artery disease involving native  coronary artery of native heart without angina pectoris   3. Chronic diastolic heart failure (HCC)       #Persistent atrial fibrillation Rhythm control indicated given the symptomatic nature of atrial fibrillation and its association with decompensated diastolic heart failure. On Eliquis  for stroke prophylaxis, continue. Discussed treatment options including an antiarrhythmic drug such as amiodarone  or Tikosyn .  The patient has a history of elevated liver enzymes and abnormal thyroid  function which makes amiodarone  a poor choice.  Her creatinine is 0.76.  Her QTc is 410 ms.  She will need to stop metoprolol  in anticipation of starting Tikosyn .  I discussed the process for Tikosyn  loading in detail including the risks and the need for inpatient hospitalization and she wishes to proceed.   #Coronary artery disease No ischemic symptoms Continue aspirin  and atorvastatin    #Chronic diastolic heart failure Rhythm control indicated as above.  Continue lisinopril , Imdur .   #Bradycardia #Post conversion pauses Symptomatic. Will require PPM implant.  Risks, benefits,  alternatives to PPM implantation were discussed in detail with the patient today. The patient understands that the risks include but are not limited to bleeding, infection, pneumothorax, perforation, tamponade, vascular damage, renal failure, MI, stroke, death, and lead dislodgement and wishes to proceed.  We will therefore schedule device implantation at the next available time.      Presents for PPM implant today. Procedure reviewed.    Signed, Ole DASEN. Cindie, MD, [facility], [facility] 07/24/2024 Electrophysiology [facility]

## 2024-07-24 NOTE — Progress Notes (Signed)
 Patient has ambulated to the bathroom. No hematoma noted to site. Old drainage marked to dressing. Dr, Cindie informed of XRAY results and device representative has seen the patient and the provider has been notified. Per Dr. Cindie patient is ok to discharge at 1930.

## 2024-07-24 NOTE — Progress Notes (Signed)
 Teaching completed with patient and daughter Both report that she is no longer taking ASA since the start up of her Eliquis  at the direction of her attending cardiologist Dr. Wilburn. Also confirm no hx of stents (cardiac/vascular). I have updated her med list/removed ASA.  They are aware no Eliquis  until 07/30/24.  Charlies Arthur, PA-C

## 2024-07-24 NOTE — Discharge Instructions (Addendum)
 After Your Pacemaker   You have a Environmental education officer  If you have a [employer]onic or [employer]ron[employer] device, plug in your home monitor once you get home, and no manual interaction is required.   If you have an [employer] or [employer] device, plug your home monitor once you get home, sit near the device, and press the large activation button. Sit nearby until the process is complete, usually notated by lights on the monitor.   If you were set up for monitoring using an app on your phone, make sure the app remains open in the background and the Bluetooth remains on.  ACTIVITY Do not lift your arm above shoulder height for 1 week after your procedure. After 7 days, you may progress as below.  You should remove your sling 24 hours after your procedure, unless otherwise instructed by your provider.     Monday July 31, 2024  Tuesday August 01, 2024 Wednesday August 02, 2024 Thursday August 03, 2024   Do not lift, push, pull, or carry anything over 10 pounds with the affected arm until 6 weeks (Monday September 04, 2024 ) after your procedure.   You may drive AFTER your wound check, unless you have been told otherwise by your provider.   Ask your healthcare provider when you can go back to work   INCISION/Dressing If you are on a blood thinner such as Coumadin, Xarelto, Eliquis , Plavix , or Pradaxa please confirm with your provider when this should be resumed. Do not resume Eliquis  until 07/30/24  If large square, outer bandage is left in place, this can be removed after 24 hours from your procedure. Do not remove steri-strips or glue as below.   If a PRESSURE DRESSING (a bulky dressing that usually goes up over your shoulder) was applied or left in place, please follow instructions given by your provider on when to return to have this removed.   Monitor your Pacemaker site for redness, swelling, and drainage. Call the device clinic at [phone] if you experience these symptoms or  fever/chills.  If your incision is sealed with Steri-strips or staples, you may shower 7 days after your procedure or when told by your provider. Do not remove the steri-strips or let the shower hit directly on your site. You may wash around your site with soap and water.    If you were discharged in a sling, please do not wear this during the day more than 48 hours after your surgery unless otherwise instructed. This may increase the risk of stiffness and soreness in your shoulder.   Avoid lotions, ointments, or perfumes over your incision until it is well-healed.  You may use a hot tub or a pool AFTER your wound check appointment if the incision is completely closed.  Pacemaker Alerts:  Some alerts are vibratory and others beep. These are NOT emergencies. Please call our office to let us  know. If this occurs at night or on weekends, it can wait until the next business day. Send a remote transmission.  If your device is capable of reading fluid status (for heart failure), you will be offered monthly monitoring to review this with you.   DEVICE MANAGEMENT Remote monitoring is used to monitor your pacemaker from home. This monitoring is scheduled every 91 days by our office. It allows us  to keep an eye on the functioning of your device to ensure it is working properly. You will routinely see your Electrophysiologist annually (more often if necessary).  This will appear  as a REMOTE check on your MyChart schedule. These are automatic and there is nothing for you to manually do unless otherwise instructed.  You should receive your ID card for your new device in 4-8 weeks. Keep this card with you at all times once received. Consider wearing a medical alert bracelet or necklace.  Your Pacemaker may be MRI compatible. This will be discussed at your next office visit/wound check.  You should avoid contact with strong electric or magnetic fields.   Do not use amateur (ham) radio equipment or electric  (arc) welding torches. MP3 player headphones with magnets should not be used. Some devices are safe to use if held at least 12 inches (30 cm) from your Pacemaker. These include power tools, lawn mowers, and speakers. If you are unsure if something is safe to use, ask your health care provider.  When using your cell phone, hold it to the ear that is on the opposite side from the Pacemaker. Do not leave your cell phone in a pocket over the Pacemaker.  You may safely use electric blankets, heating pads, computers, and microwave ovens.  Call the office right away if: You have chest pain. You feel more short of breath than you have felt before. You feel more light-headed than you have felt before. Your incision starts to open up.  This information is not intended to replace advice given to you by your health care provider. Make sure you discuss any questions you have with your health care provider.

## 2024-07-25 ENCOUNTER — Encounter (HOSPITAL_COMMUNITY): Payer: Self-pay | Admitting: Cardiology

## 2024-07-25 MED FILL — Gentamicin Sulfate Inj 40 MG/ML: INTRAMUSCULAR | Qty: 80 | Status: AC

## 2024-07-25 MED FILL — Midazolam HCl Inj 2 MG/2ML (Base Equivalent): INTRAMUSCULAR | Qty: 0.5 | Status: AC

## 2024-07-30 ENCOUNTER — Emergency Department

## 2024-07-30 ENCOUNTER — Inpatient Hospital Stay
Admission: EM | Admit: 2024-07-30 | Discharge: 2024-08-12 | DRG: 329 | Disposition: A | Discharge status: Home Health | Attending: Internal Medicine | Admitting: Internal Medicine

## 2024-07-30 ENCOUNTER — Encounter: Payer: Self-pay | Admitting: Internal Medicine

## 2024-07-30 ENCOUNTER — Other Ambulatory Visit: Payer: Self-pay

## 2024-07-30 ENCOUNTER — Observation Stay

## 2024-07-30 DIAGNOSIS — I2510 Atherosclerotic heart disease of native coronary artery without angina pectoris: Secondary | ICD-10-CM | POA: Diagnosis not present

## 2024-07-30 DIAGNOSIS — R54 Age-related physical debility: Secondary | ICD-10-CM | POA: Diagnosis present

## 2024-07-30 DIAGNOSIS — Z1152 Encounter for screening for COVID-19: Secondary | ICD-10-CM

## 2024-07-30 DIAGNOSIS — Z888 Allergy status to other drugs, medicaments and biological substances status: Secondary | ICD-10-CM

## 2024-07-30 DIAGNOSIS — R1111 Vomiting without nausea: Secondary | ICD-10-CM | POA: Diagnosis not present

## 2024-07-30 DIAGNOSIS — K567 Ileus, unspecified: Secondary | ICD-10-CM | POA: Diagnosis not present

## 2024-07-30 DIAGNOSIS — K921 Melena: Secondary | ICD-10-CM

## 2024-07-30 DIAGNOSIS — I252 Old myocardial infarction: Secondary | ICD-10-CM

## 2024-07-30 DIAGNOSIS — R140 Abdominal distension (gaseous): Secondary | ICD-10-CM | POA: Diagnosis not present

## 2024-07-30 DIAGNOSIS — R112 Nausea with vomiting, unspecified: Secondary | ICD-10-CM | POA: Diagnosis not present

## 2024-07-30 DIAGNOSIS — Z91013 Allergy to seafood: Secondary | ICD-10-CM

## 2024-07-30 DIAGNOSIS — E039 Hypothyroidism, unspecified: Secondary | ICD-10-CM | POA: Diagnosis present

## 2024-07-30 DIAGNOSIS — K56609 Unspecified intestinal obstruction, unspecified as to partial versus complete obstruction: Principal | ICD-10-CM | POA: Diagnosis present

## 2024-07-30 DIAGNOSIS — Z9842 Cataract extraction status, left eye: Secondary | ICD-10-CM

## 2024-07-30 DIAGNOSIS — I4821 Permanent atrial fibrillation: Secondary | ICD-10-CM | POA: Diagnosis not present

## 2024-07-30 DIAGNOSIS — N390 Urinary tract infection, site not specified: Secondary | ICD-10-CM | POA: Diagnosis not present

## 2024-07-30 DIAGNOSIS — K56600 Partial intestinal obstruction, unspecified as to cause: Secondary | ICD-10-CM | POA: Diagnosis not present

## 2024-07-30 DIAGNOSIS — Z803 Family history of malignant neoplasm of breast: Secondary | ICD-10-CM

## 2024-07-30 DIAGNOSIS — I959 Hypotension, unspecified: Secondary | ICD-10-CM | POA: Diagnosis not present

## 2024-07-30 DIAGNOSIS — Z885 Allergy status to narcotic agent status: Secondary | ICD-10-CM

## 2024-07-30 DIAGNOSIS — Z7901 Long term (current) use of anticoagulants: Secondary | ICD-10-CM

## 2024-07-30 DIAGNOSIS — Z880 Allergy status to penicillin: Secondary | ICD-10-CM

## 2024-07-30 DIAGNOSIS — Z789 Other specified health status: Secondary | ICD-10-CM | POA: Diagnosis not present

## 2024-07-30 DIAGNOSIS — I10 Essential (primary) hypertension: Secondary | ICD-10-CM | POA: Diagnosis present

## 2024-07-30 DIAGNOSIS — E871 Hypo-osmolality and hyponatremia: Secondary | ICD-10-CM | POA: Diagnosis present

## 2024-07-30 DIAGNOSIS — Z7189 Other specified counseling: Secondary | ICD-10-CM | POA: Diagnosis not present

## 2024-07-30 DIAGNOSIS — K219 Gastro-esophageal reflux disease without esophagitis: Secondary | ICD-10-CM | POA: Diagnosis present

## 2024-07-30 DIAGNOSIS — R188 Other ascites: Secondary | ICD-10-CM | POA: Diagnosis present

## 2024-07-30 DIAGNOSIS — E785 Hyperlipidemia, unspecified: Secondary | ICD-10-CM | POA: Diagnosis present

## 2024-07-30 DIAGNOSIS — E46 Unspecified protein-calorie malnutrition: Secondary | ICD-10-CM | POA: Diagnosis present

## 2024-07-30 DIAGNOSIS — K565 Intestinal adhesions [bands], unspecified as to partial versus complete obstruction: Secondary | ICD-10-CM | POA: Diagnosis present

## 2024-07-30 DIAGNOSIS — I480 Paroxysmal atrial fibrillation: Secondary | ICD-10-CM | POA: Diagnosis not present

## 2024-07-30 DIAGNOSIS — M1990 Unspecified osteoarthritis, unspecified site: Secondary | ICD-10-CM | POA: Diagnosis present

## 2024-07-30 DIAGNOSIS — Z79890 Hormone replacement therapy (postmenopausal): Secondary | ICD-10-CM

## 2024-07-30 DIAGNOSIS — Z955 Presence of coronary angioplasty implant and graft: Secondary | ICD-10-CM

## 2024-07-30 DIAGNOSIS — Z950 Presence of cardiac pacemaker: Secondary | ICD-10-CM

## 2024-07-30 DIAGNOSIS — D62 Acute posthemorrhagic anemia: Secondary | ICD-10-CM | POA: Diagnosis not present

## 2024-07-30 DIAGNOSIS — K660 Peritoneal adhesions (postprocedural) (postinfection): Secondary | ICD-10-CM | POA: Diagnosis not present

## 2024-07-30 DIAGNOSIS — Z79899 Other long term (current) drug therapy: Secondary | ICD-10-CM

## 2024-07-30 DIAGNOSIS — K439 Ventral hernia without obstruction or gangrene: Secondary | ICD-10-CM | POA: Diagnosis present

## 2024-07-30 DIAGNOSIS — K460 Unspecified abdominal hernia with obstruction, without gangrene: Principal | ICD-10-CM | POA: Diagnosis present

## 2024-07-30 DIAGNOSIS — Z9841 Cataract extraction status, right eye: Secondary | ICD-10-CM

## 2024-07-30 DIAGNOSIS — K55019 Acute (reversible) ischemia of small intestine, extent unspecified: Secondary | ICD-10-CM | POA: Diagnosis present

## 2024-07-30 DIAGNOSIS — Z96653 Presence of artificial knee joint, bilateral: Secondary | ICD-10-CM | POA: Diagnosis present

## 2024-07-30 DIAGNOSIS — Z6826 Body mass index (BMI) 26.0-26.9, adult: Secondary | ICD-10-CM

## 2024-07-30 DIAGNOSIS — Z96652 Presence of left artificial knee joint: Secondary | ICD-10-CM | POA: Diagnosis present

## 2024-07-30 DIAGNOSIS — Z886 Allergy status to analgesic agent status: Secondary | ICD-10-CM

## 2024-07-30 DIAGNOSIS — Z23 Encounter for immunization: Secondary | ICD-10-CM

## 2024-07-30 DIAGNOSIS — Z882 Allergy status to sulfonamides status: Secondary | ICD-10-CM

## 2024-07-30 DIAGNOSIS — Z87442 Personal history of urinary calculi: Secondary | ICD-10-CM

## 2024-07-30 DIAGNOSIS — R1031 Right lower quadrant pain: Secondary | ICD-10-CM | POA: Diagnosis not present

## 2024-07-30 DIAGNOSIS — H401133 Primary open-angle glaucoma, bilateral, severe stage: Secondary | ICD-10-CM | POA: Diagnosis present

## 2024-07-30 DIAGNOSIS — Z4682 Encounter for fitting and adjustment of non-vascular catheter: Secondary | ICD-10-CM | POA: Diagnosis not present

## 2024-07-30 DIAGNOSIS — E440 Moderate protein-calorie malnutrition: Secondary | ICD-10-CM | POA: Diagnosis present

## 2024-07-30 DIAGNOSIS — Z515 Encounter for palliative care: Secondary | ICD-10-CM | POA: Diagnosis not present

## 2024-07-30 LAB — LIPASE, BLOOD: Lipase: 31 U/L (ref 11–51)

## 2024-07-30 LAB — URINALYSIS, COMPLETE (UACMP) WITH MICROSCOPIC
Bacteria, UA: NONE SEEN
Bilirubin Urine: NEGATIVE
Glucose, UA: NEGATIVE mg/dL
Hgb urine dipstick: NEGATIVE
Ketones, ur: 5 mg/dL — AB
Leukocytes,Ua: NEGATIVE
Nitrite: NEGATIVE
Protein, ur: 30 mg/dL — AB
Specific Gravity, Urine: 1.029 (ref 1.005–1.030)
pH: 5 (ref 5.0–8.0)

## 2024-07-30 LAB — CBC WITH DIFFERENTIAL/PLATELET
Abs Immature Granulocytes: 0.05 K/uL (ref 0.00–0.07)
Basophils Absolute: 0 K/uL (ref 0.0–0.1)
Basophils Relative: 0 %
Eosinophils Absolute: 0 K/uL (ref 0.0–0.5)
Eosinophils Relative: 0 %
HCT: 39.1 % (ref 36.0–46.0)
Hemoglobin: 12.6 g/dL (ref 12.0–15.0)
Immature Granulocytes: 0 %
Lymphocytes Relative: 5 %
Lymphs Abs: 0.6 K/uL — ABNORMAL LOW (ref 0.7–4.0)
MCH: 30.1 pg (ref 26.0–34.0)
MCHC: 32.2 g/dL (ref 30.0–36.0)
MCV: 93.5 fL (ref 80.0–100.0)
Monocytes Absolute: 0.7 K/uL (ref 0.1–1.0)
Monocytes Relative: 6 %
Neutro Abs: 10.9 K/uL — ABNORMAL HIGH (ref 1.7–7.7)
Neutrophils Relative %: 89 %
Platelets: 158 K/uL (ref 150–400)
RBC: 4.18 MIL/uL (ref 3.87–5.11)
RDW: 13.6 % (ref 11.5–15.5)
WBC: 12.4 K/uL — ABNORMAL HIGH (ref 4.0–10.5)
nRBC: 0 % (ref 0.0–0.2)

## 2024-07-30 LAB — COMPREHENSIVE METABOLIC PANEL WITH GFR
ALT: 18 U/L (ref 0–44)
AST: 24 U/L (ref 15–41)
Albumin: 3.7 g/dL (ref 3.5–5.0)
Alkaline Phosphatase: 89 U/L (ref 38–126)
Anion gap: 13 (ref 5–15)
BUN: 18 mg/dL (ref 8–23)
CO2: 21 mmol/L — ABNORMAL LOW (ref 22–32)
Calcium: 9.8 mg/dL (ref 8.9–10.3)
Chloride: 103 mmol/L (ref 98–111)
Creatinine, Ser: 0.66 mg/dL (ref 0.44–1.00)
GFR, Estimated: >60 mL/min (ref >60)
Glucose, Bld: 141 mg/dL — ABNORMAL HIGH (ref 70–99)
Potassium: 3.9 mmol/L (ref 3.5–5.1)
Sodium: 137 mmol/L (ref 135–145)
Total Bilirubin: 1.1 mg/dL (ref 0.0–1.2)
Total Protein: 7.1 g/dL (ref 6.5–8.1)

## 2024-07-30 LAB — RESP PANEL BY RT-PCR (RSV, FLU A&B, COVID)  RVPGX2
Influenza A by PCR: NEGATIVE
Influenza B by PCR: NEGATIVE
Resp Syncytial Virus by PCR: NEGATIVE
SARS Coronavirus 2 by RT PCR: NEGATIVE

## 2024-07-30 LAB — TROPONIN I (HIGH SENSITIVITY): Troponin I (High Sensitivity): 10 ng/L (ref <18)

## 2024-07-30 LAB — MAGNESIUM: Magnesium: 2.2 mg/dL (ref 1.7–2.4)

## 2024-07-30 MED ORDER — LEVOFLOXACIN 500 MG PO TABS: 250.0000 mg | ORAL_TABLET | Freq: Once | ORAL | Status: DC

## 2024-07-30 MED ORDER — LATANOPROST 0.005 % OP SOLN
1.0000 [drp] | Freq: Every day | OPHTHALMIC | Status: AC
Start: 2024-07-31 — End: ?
  Administered 2024-07-31 – 2024-08-11 (×13): 1 [drp] via OPHTHALMIC
  Filled 2024-07-30: qty 2.5

## 2024-07-30 MED ORDER — SODIUM CHLORIDE 0.9 % IV SOLN
12.5000 mg | Freq: Four times a day (QID) | INTRAVENOUS | Status: DC | PRN
  Administered 2024-08-02: 12.5 mg via INTRAVENOUS
  Filled 2024-07-30: qty 12.5

## 2024-07-30 MED ORDER — ENOXAPARIN SODIUM 40 MG/0.4ML IJ SOSY
40.0000 mg | PREFILLED_SYRINGE | INTRAMUSCULAR | Status: DC
  Administered 2024-07-30 – 2024-08-03 (×4): 40 mg via SUBCUTANEOUS
  Filled 2024-07-30 (×5): qty 0.4

## 2024-07-30 MED ORDER — MORPHINE SULFATE (PF) 2 MG/ML IV SOLN
2.0000 mg | INTRAVENOUS | Status: DC | PRN
  Filled 2024-07-30: qty 1

## 2024-07-30 MED ORDER — LEVOFLOXACIN 500 MG PO TABS
750.0000 mg | ORAL_TABLET | Freq: Once | ORAL | Status: AC
  Administered 2024-07-30: 750 mg via ORAL
  Filled 2024-07-30: qty 2

## 2024-07-30 MED ORDER — HYDRALAZINE HCL 20 MG/ML IJ SOLN: 5.0000 mg | INTRAMUSCULAR | Status: DC | PRN

## 2024-07-30 MED ORDER — ONDANSETRON HCL 4 MG/2ML IJ SOLN
4.0000 mg | Freq: Once | INTRAMUSCULAR | Status: AC
  Administered 2024-07-30: 4 mg via INTRAVENOUS
  Filled 2024-07-30: qty 2

## 2024-07-30 MED ORDER — ACETAMINOPHEN 650 MG RE SUPP: 650.0000 mg | Freq: Four times a day (QID) | RECTAL | Status: DC | PRN

## 2024-07-30 MED ORDER — IOHEXOL 300 MG/ML  SOLN
100.0000 mL | Freq: Once | INTRAMUSCULAR | Status: AC | PRN
  Administered 2024-07-30: 100 mL via INTRAVENOUS

## 2024-07-30 MED ORDER — ONDANSETRON HCL 4 MG/2ML IJ SOLN
4.0000 mg | Freq: Four times a day (QID) | INTRAMUSCULAR | Status: DC | PRN
  Administered 2024-07-30 – 2024-08-08 (×5): 4 mg via INTRAVENOUS
  Filled 2024-07-30 (×4): qty 2

## 2024-07-30 MED ORDER — ONDANSETRON HCL 4 MG PO TABS
4.0000 mg | ORAL_TABLET | Freq: Four times a day (QID) | ORAL | Status: DC | PRN
  Administered 2024-08-02: 4 mg via ORAL
  Filled 2024-07-30: qty 1

## 2024-07-30 MED ORDER — ONDANSETRON 4 MG PO TBDP
4.0000 mg | ORAL_TABLET | Freq: Once | ORAL | Status: AC
  Administered 2024-07-30: 4 mg via ORAL
  Filled 2024-07-30: qty 1

## 2024-07-30 MED ORDER — TIMOLOL MALEATE 0.5 % OP SOLN
1.0000 [drp] | Freq: Every day | OPHTHALMIC | Status: AC
Start: 2024-07-31 — End: ?
  Administered 2024-07-31 – 2024-08-12 (×12): 1 [drp] via OPHTHALMIC
  Filled 2024-07-30: qty 5

## 2024-07-30 MED ORDER — ACETAMINOPHEN 325 MG PO TABS: 650.0000 mg | ORAL_TABLET | Freq: Four times a day (QID) | ORAL | Status: DC | PRN

## 2024-07-30 MED ORDER — LACTATED RINGERS IV SOLN: INTRAVENOUS | Status: AC

## 2024-07-30 MED ORDER — SODIUM CHLORIDE 0.9 % IV BOLUS
1000.0000 mL | Freq: Once | INTRAVENOUS | Status: AC
  Administered 2024-07-30: 1000 mL via INTRAVENOUS

## 2024-07-30 MED ORDER — INFLUENZA VAC SPLIT HIGH-DOSE 0.5 ML IM SUSY
0.5000 mL | PREFILLED_SYRINGE | INTRAMUSCULAR | Status: AC
  Administered 2024-07-31: 0.5 mL via INTRAMUSCULAR
  Filled 2024-07-30: qty 0.5

## 2024-07-30 NOTE — ED Triage Notes (Signed)
 Pt c/o n/v since last night and lower abdominal pain. Pt reports some relief overnight, but it started back this afternoon. Pt reports vomiting 1-2 times per hour. Pt had pacemaker placed on Monday for afib.

## 2024-07-30 NOTE — H&P (Signed)
 History and Physical    Patient: Maria Trujillo DOB: 10-26-1934 DOA: 07/30/2024 DOS: the patient was seen and examined on 07/30/2024 PCP: Jeffie Cheryl BRAVO, MD  Patient coming from: Home  Chief Complaint:  Chief Complaint  Patient presents with   Nausea   Emesis    HPI: Maria Trujillo is a 89 y.o. female with medical history significant for CAD with prior NSTEMI, HTN, HLD, persistent A-fib on Eliquis  and amiodarone , complicated by post conversion  pauses/bradycardia with syncope 04/2024 s/p pacemaker on 07/24/2024 -(Eliquis  held 9/26 to 9/30), being admitted for small bowel obstruction.  She presented with intractable nonbloody, nonbilious, non-coffee-ground vomiting starting on the night prior to arrival associated with lower abdominal pain.  Denies change in bowel habits.  She denies fever or chills, dysuria, cough or chest pain. In the ED vitals within normal limits. Labs including CBC, CMP, magnesium , UA and respiratory viral panel notable only for WBC of 12.4.  She did have some ketones in the urine without infection.  Troponin not done.  EKG showed sinus at 60 CT abdomen and pelvis with contrast showed an SBO with suspected transition point in the right lower quadrant and mesenteric edema and free fluid. The ED provider spoke with surgeon, Dr. Jordis  who requested NG tube and hospitalist admission and will follow. Admission requested     Past Medical History:  Diagnosis Date   Arthritis    Atrial fibrillation (HCC)    History of kidney stones    Hypothyroidism    Myocardial infarct (HCC)    PONV (postoperative nausea and vomiting)    Past Surgical History:  Procedure Laterality Date   BREAST EXCISIONAL BIOPSY Bilateral 1984   benign   CARDIOVERSION N/A 03/04/2023   Procedure: CARDIOVERSION;  Surgeon: Ammon Blunt, MD;  Location: [facility] ORS;  Service: Cardiovascular;  Laterality: N/A;   CATARACT EXTRACTION, BILATERAL     CHOLECYSTECTOMY     EYE SURGERY      JOINT REPLACEMENT Left 12/2012   knee   KNEE ARTHROPLASTY Right 02/15/2017   Procedure: COMPUTER ASSISTED TOTAL KNEE ARTHROPLASTY;  Surgeon: Lynwood SHAUNNA Hue, MD;  Location: [facility] ORS;  Service: Orthopedics;  Laterality: Right;   LEFT HEART CATH AND CORONARY ANGIOGRAPHY N/A 11/04/2020   Procedure: LEFT HEART CATH AND CORONARY ANGIOGRAPHY;  Surgeon: Ammon Blunt, MD;  Location: [facility] INVASIVE CV LAB;  Service: Cardiovascular;  Laterality: N/A;   PACEMAKER IMPLANT N/A 07/24/2024   Procedure: PACEMAKER IMPLANT;  Surgeon: Cindie Ole DASEN, MD;  Location: [facility] INVASIVE CV LAB;  Service: Cardiovascular;  Laterality: N/A;   PHOTOCOAGULATION WITH LASER Right 04/20/2018   Procedure: TRANSCLERAL DIODE CYCLOPHOTOCOAGULATION  RIGHT per Hope block;  Surgeon: Mittie Gaskin, MD;  Location: [facility];  Service: Ophthalmology;  Laterality: Right;   ROTATOR CUFF REPAIR Left 2010   TONSILLECTOMY     Social History:  reports that she has never smoked. She has never used smokeless tobacco. She reports that she does not drink alcohol  and does not use drugs.  Allergies  Allergen Reactions   Celecoxib Hives   Dorzolamide Shortness Of Breath    Other Reaction(s): Other (See Comments)  Pt stated had reactions but unable to provide information   Penicillins Other (See Comments), Hives and Rash    Redness (sunburn type rash with peeling)  Has patient had a PCN reaction causing immediate rash, facial/tongue/throat swelling, SOB or lightheadedness with hypotension: No  Has patient had a PCN reaction causing severe rash involving mucus membranes or  skin necrosis: No  Has patient had a PCN reaction that required hospitalization No  Has patient had a PCN reaction occurring within the last 10 years: Yes  If all of the above answers are NO, then may proceed with Cephalosporin use.  Other Reaction(s): Other (See Comments)  Redness (sunburn type rash with peeling)  Has patient had a PCN  reaction causing immediate rash, facial/tongue/throat swelling, SOB or lightheadedness with hypotension: No  Has patient had a PCN reaction causing severe rash involving mucus membranes or skin necrosis: No  Has patient had a PCN reaction that required hospitalization No  Has patient had a PCN reaction occurring within the last 10 years: Yes  If all of the above answers are NO, then may proceed with Cephalosporin use.  Redness and flushing  Redness and flushing  Redness (sunburn type rash with peeling)   Shellfish Allergy Hives and Itching    All seafood   Timolol  Maleate Other (See Comments) and Shortness Of Breath    Redness  Other Reaction(s): Other (See Comments)  Shortness of breath  Redness  Redness  Higher Doses  Shortness of breath    Redness   Sulfa Antibiotics Other (See Comments)    Redness in eyes   Amoxicillin Rash   Amoxicillin-Pot Clavulanate Other (See Comments)    Redness and flushing   Brimonidine Other (See Comments)    blisters   Cefuroxime Axetil Other (See Comments)    redness   Dipivefrin Other (See Comments)    sleepiness   Oxycodone      BP dropped  Other Reaction(s): Other (See Comments)  Dropped BP down too low  Dropped blood pressure   BP dropped  Dropped BP down too low    Family History  Problem Relation Age of Onset   Breast cancer Sister 55    Prior to Admission medications   Medication Sig Start Date End Date Taking? Authorizing Provider  acetaminophen  (TYLENOL ) 650 MG CR tablet Take 650 mg by mouth daily as needed for pain.    [provider]  amiodarone  (PACERONE ) 200 MG tablet Take 2 tablets (400 mg total) by mouth 2 (two) times daily for 10 days, THEN 1 tablet (200 mg total) daily. Patient taking differently:  (200 mg total) daily. 05/24/24 07/24/24  Awanda City, MD  apixaban  (ELIQUIS ) 5 MG TABS tablet Take 1 tablet (5 mg total) by mouth 2 (two) times daily. 07/16/23 07/24/24  Riddle, Suzann, NP  atorvastatin  (LIPITOR )  40 MG tablet Take 40 mg by mouth at bedtime.    [provider]  azelastine  (ASTELIN ) 0.1 % nasal spray Place 1 spray into both nostrils daily. 11/13/21   [provider]  bimatoprost (LUMIGAN) 0.03 % ophthalmic solution Place 1 drop into the right eye at bedtime.    [provider]  Calcium  Carb-Cholecalciferol  (CALCIUM  CARBONATE-VITAMIN D3) 600-400 MG-UNIT TABS Take 1 tablet by mouth daily.    [provider]  Carboxymethylcellulose Sodium (THERATEARS) 0.25 % SOLN Place 1 drop into both eyes daily as needed (Dry eyes).    [provider]  Cholecalciferol  (VITAMIN D ) 50 MCG (2000 UT) tablet Take 2,000 Units by mouth daily.    [provider]  Docusate Sodium  100 MG capsule Take 100 mg by mouth daily as needed for mild constipation or moderate constipation.    [provider]  fexofenadine (ALLEGRA) 180 MG tablet Take 180 mg by mouth daily.    [provider]  folic acid (FOLVITE) 400 MCG tablet Take  400 mcg by mouth daily.    [provider]  furosemide  (LASIX ) 20 MG tablet Take 20 mg by mouth daily. prn Patient not taking: Reported on 07/21/2024 01/04/23 07/10/24  [provider]  ipratropium (ATROVENT) 0.06 % nasal spray Place 1 spray into both nostrils daily as needed for rhinitis.    [provider]  isosorbide  mononitrate (IMDUR ) 30 MG 24 hr tablet Take 1 tablet (30 mg total) by mouth daily. 11/06/20 07/24/24  Awanda City, MD  levothyroxine  (SYNTHROID , LEVOTHROID) 100 MCG tablet Take 100 mcg by mouth daily before breakfast.    [provider]  lisinopril  (ZESTRIL ) 5 MG tablet Take 1 tablet (5 mg total) by mouth daily. 11/06/20 07/24/24  Awanda City, MD  LUMIGAN 0.01 % SOLN Place 1 drop into the right eye at bedtime. 06/15/24   [provider]  metoprolol  tartrate (LOPRESSOR ) 25 MG tablet Take 0.5 tablets (12.5 mg total) by mouth daily as needed. for heart rate over 100.  Home med. 05/24/24    Awanda City, MD  Multiple Vitamin (MULTIVITAMIN WITH MINERALS) TABS tablet Take 1 tablet by mouth daily.    [provider]  Multiple Vitamins-Minerals (PRESERVISION AREDS 2 PO) Take 2 tablets by mouth 2 (two) times daily.    [provider]  pantoprazole  (PROTONIX ) 40 MG tablet Take 40 mg by mouth daily.    [provider]  prednisoLONE acetate (PRED FORTE) 1 % ophthalmic suspension Place 1 drop into the right eye 4 (four) times daily. 06/22/24   [provider]  timolol  (TIMOPTIC ) 0.5 % ophthalmic solution Place 1 drop into the right eye in the morning. 10/28/20   [provider]  vitamin B-12 (CYANOCOBALAMIN ) 100 MCG tablet Take 100 mcg by mouth daily.    [provider]    Physical Exam: Vitals:   07/30/24 1756 07/30/24 1900  BP: 121/63 (!) 137/53  Pulse: 60 60  Resp: 17 16  Temp: 98.3 F (36.8 C)   TempSrc: Oral   SpO2: 98% 98%  Weight: 74.4 kg   Height: 5' 4 (1.626 m)    Physical Exam Vitals and nursing note reviewed.  Constitutional:      General: She is not in acute distress.    Comments: Frail-appearing elderly female in no distress.  NG tube draining greenish-yellow liquid  HENT:     Head: Normocephalic and atraumatic.  Cardiovascular:     Rate and Rhythm: Normal rate and regular rhythm.     Heart sounds: Normal heart sounds.  Pulmonary:     Effort: Pulmonary effort is normal.     Breath sounds: Normal breath sounds.  Abdominal:     Palpations: Abdomen is soft.     Tenderness: There is no abdominal tenderness.  Neurological:     Mental Status: Mental status is at baseline.     Labs on Admission: I have personally reviewed following labs and imaging studies  CBC: Recent Labs  Lab 07/30/24 1800  WBC 12.4*  NEUTROABS 10.9*  HGB 12.6  HCT 39.1  MCV 93.5  PLT 158   Basic Metabolic Panel: Recent Labs  Lab 07/30/24 1800  NA 137  K 3.9  CL 103  CO2 21*  GLUCOSE 141*  BUN 18  CREATININE 0.66  CALCIUM   9.8  MG 2.2   GFR: Estimated Creatinine Clearance: 48 mL/min (by C-G formula based on SCr of 0.66 mg/dL). Liver Function Tests: Recent Labs  Lab 07/30/24 1800  AST 24  ALT 18  ALKPHOS 89  BILITOT 1.1  PROT 7.1  ALBUMIN 3.7   No results for input(s): LIPASE, AMYLASE in the last 168 hours. No results for input(s): AMMONIA in the last 168 hours. Coagulation Profile: No results for input(s): INR, PROTIME in the last 168 hours. Cardiac Enzymes: No results for input(s): CKTOTAL, CKMB, CKMBINDEX, TROPONINI in the last 168 hours. BNP (last 3 results) No results for input(s): PROBNP in the last 8760 hours. HbA1C: No results for input(s): HGBA1C in the last 72 hours. CBG: No results for input(s): GLUCAP in the last 168 hours. Lipid Profile: No results for input(s): CHOL, HDL, LDLCALC, TRIG, CHOLHDL, LDLDIRECT in the last 72 hours. Thyroid  Function Tests: No results for input(s): TSH, T4TOTAL, FREET4, T3FREE, THYROIDAB in the last 72 hours. Anemia Panel: No results for input(s): VITAMINB12, FOLATE, FERRITIN, TIBC, IRON, RETICCTPCT in the last 72 hours. Urine analysis:    Component Value Date/Time   COLORURINE AMBER (A) 07/30/2024 1908   APPEARANCEUR HAZY (A) 07/30/2024 1908   APPEARANCEUR Clear 12/12/2012 1557   LABSPEC 1.029 07/30/2024 1908   LABSPEC 1.013 12/12/2012 1557   PHURINE 5.0 07/30/2024 1908   GLUCOSEU NEGATIVE 07/30/2024 1908   GLUCOSEU Negative 12/12/2012 1557   HGBUR NEGATIVE 07/30/2024 1908   BILIRUBINUR NEGATIVE 07/30/2024 1908   BILIRUBINUR Negative 12/12/2012 1557   KETONESUR 5 (A) 07/30/2024 1908   PROTEINUR 30 (A) 07/30/2024 1908   NITRITE NEGATIVE 07/30/2024 1908   LEUKOCYTESUR NEGATIVE 07/30/2024 1908   LEUKOCYTESUR Trace 12/12/2012 1557    Radiological Exams on Admission: CT ABDOMEN PELVIS W CONTRAST Result Date: 07/30/2024 CLINICAL DATA:  Abdominal pain. Intractable vomiting. Recent  pacemaker placement. Prior cholecystectomy. EXAM: CT ABDOMEN AND PELVIS WITH CONTRAST TECHNIQUE: Multidetector CT imaging of the abdomen and pelvis was performed using the standard protocol following bolus administration of intravenous contrast. RADIATION DOSE REDUCTION: This exam was performed according to the departmental dose-optimization program which includes automated exposure control, adjustment of the mA and/or kV according to patient size and/or use of iterative reconstruction technique. CONTRAST:  OMNIPAQUE  IOHEXOL  300 MG/ML  SOLN COMPARISON:  Noncontrast CT 12/11/2014 FINDINGS: Lower chest: Pacemaker leads overlie the right atrium and ventricle. No pericardial effusion. No basilar airspace disease or pleural effusion. Coronary artery calcifications. Hepatobiliary: No focal liver abnormality. Cholecystectomy. Mild central intrahepatic biliary ductal dilatation. No common bile duct dilatation. Pancreas: No ductal dilatation or inflammation. Spleen: Normal in size without focal abnormality. Adrenals/Urinary Tract: No adrenal nodule. No hydronephrosis. No renal calculi. No suspicious renal abnormality. Partially distended urinary bladder, normal for degree of distension. Stomach/Bowel: Dilated fluid-filled stomach. Dilated fluid-filled small bowel. Suspected transition point in the right lower quadrant, series 2, image 61, the distal small bowel is decompressed. There is mesenteric edema and free fluid. No bowel pneumatosis. Small to moderate volume of stool in the colon. No colonic inflammation. Normal appendix is potentially visualized. Vascular/Lymphatic: Aortic atherosclerosis and tortuosity. No aortic aneurysm. Patent portal vein. No portal venous or mesenteric gas. No suspicious lymphadenopathy. Reproductive: Normal for age. Other: Generalized mesenteric edema. Free fluid in the mesentery, abdomen and pelvis. No free air or focal fluid collection. Musculoskeletal: Multilevel degenerative change in  the spine. There are no acute or suspicious osseous abnormalities. IMPRESSION: 1. Small bowel obstruction with suspected transition point in the right lower quadrant. Mesenteric edema and free fluid. 2. Aortic Atherosclerosis (ICD10-I70.0). Electronically Signed   By: Andrea Gasman M.D.   On: 07/30/2024 20:27   Data Reviewed for HPI: Relevant notes from primary care and specialist visits, past discharge  summaries as available in EHR, including Care Everywhere. Prior diagnostic testing as pertinent to current admission diagnoses Updated medications and problem lists for reconciliation ED course, including vitals, labs, imaging, treatment and response to treatment Triage notes, nursing and pharmacy notes and ED provider's notes Notable results as noted above in HPI      Assessment and Plan: * Small bowel obstruction (HCC) NG tube to low intermittent suction IV antiemetics and pain meds Surgery consult to follow- Dr Jordis  Coronary artery disease with history of NSTEMI Continue atorvastatin , Imdur  and lisinopril  when tolerating n.p.o.  Status cardiac pacemaker 07/24/2024 for sinus pause with syncope History of sinus pauses on antiarrhythmics No acute issues suspected Patient denies chest pain, shortness of breath or lightheadedness Will get baseline troponin  Permanent atrial fibrillation (HCC) Chronic anticoagulation Will hold Eliquis  in case of need for surgical intervention Lovenox  DVT prophylaxis in the interim  HTN (hypertension) Hydralazine  as needed while NPO  Acquired hypothyroidism Hold levothyroxine  at 100 for tonight    DVT prophylaxis: Lovenox   Consults:  surgerg  Advance Care Planning:   Code Status: Prior   Family Communication: Daughter at bedside  Disposition Plan: Back to previous home environment  Severity of Illness: The appropriate patient status for this patient is OBSERVATION. Observation status is judged to be reasonable and necessary in  order to provide the required intensity of service to ensure the patient's safety. The patient's presenting symptoms, physical exam findings, and initial radiographic and laboratory data in the context of their medical condition is felt to place them at decreased risk for further clinical deterioration. Furthermore, it is anticipated that the patient will be medically stable for discharge from the hospital within 2 midnights of admission.   Author: Delayne LULLA Solian, MD 07/30/2024 9:40 PM  For on call review [email or web address].

## 2024-07-30 NOTE — Assessment & Plan Note (Signed)
 Continue atorvastatin , Imdur  and lisinopril  when tolerating n.p.o.

## 2024-07-30 NOTE — Assessment & Plan Note (Signed)
 Hydralazine  as needed while NPO

## 2024-07-30 NOTE — ED Provider Notes (Signed)
 [facility] Provider Note    Event Date/Time   First MD Initiated Contact with Patient 07/30/24 1820     (approximate)   History   Nausea and Emesis  Pt comes via EMS with c/o N/V since yesterday. Pt had new pacemaker placed on 9/29. Pt denies any cp, sob or belly pain. Pt did vomit in route to hospital.   CBG 125   Pt c/o n/v since last night and lower abdominal pain. Pt reports some relief overnight, but it started back this afternoon. Pt reports vomiting 1-2 times per hour. Pt had pacemaker placed on Monday for afib.   HPI Maria Trujillo is a 89 y.o. female atrial fibrillation on Eliquis , hypertension, esophageal reflux, prior NSTEMI presents for evaluation of nausea and vomiting and abdominal pain - Patient developed several episodes of vomiting last night, nonbilious/nonbloody.  Ongoing until about 3 AM.  Did have bowel movement this morning which was reportedly unremarkable, no diarrhea, no black or bloody stools.  Then since 3 PM or so has been vomiting nearly hourly, remains nonbloody. - Has been having lower abdominal pain and some upper right sided pain.  History of prior cholecystectomy.  No fevers. - No chest pain or shortness of breath - No urinary symptoms but did complain of some bilateral flank discomfort last night - Not tolerating any p.o. including fluids - Did have a pacemaker placed on 07/24/2024 - Collateral gathered from patient's son bedside  Per chart review, patient had a pacemaker placed on 07/24/2024.     Physical Exam   Triage Vital Signs: ED Triage Vitals [07/30/24 1756]  Encounter Vitals Group     BP 121/63     Girls Systolic BP Percentile      Girls Diastolic BP Percentile      Boys Systolic BP Percentile      Boys Diastolic BP Percentile      Pulse Rate 60     Resp 17     Temp 98.3 F (36.8 C)     Temp Source Oral     SpO2 98 %     Weight 164 lb (74.4 kg)     Height 5' 4 (1.626 m)     Head Circumference       Peak Flow      Pain Score 0     Pain Loc      Pain Education      Exclude from Growth Chart     Most recent vital signs: Vitals:   07/30/24 1756 07/30/24 1900  BP: 121/63 (!) 137/53  Pulse: 60 60  Resp: 17 16  Temp: 98.3 F (36.8 C)   SpO2: 98% 98%     General: Awake, no distress.  CV:  Good peripheral perfusion. RRR, RP 2+ Resp:  Normal effort. CTAB Abd:  No distention.  Mild tenderness in suprapubic region, right lower quadrant, right upper quadrant.  No obvious distention.  No tenderness elsewhere in abdomen.  No CVA tenderness.    ED Results / Procedures / Treatments   Labs (all labs ordered are listed, but only abnormal results are displayed) Labs Reviewed  CBC WITH DIFFERENTIAL/PLATELET - Abnormal; Notable for the following components:      Result Value   WBC 12.4 (*)    Neutro Abs 10.9 (*)    Lymphs Abs 0.6 (*)    All other components within normal limits  COMPREHENSIVE METABOLIC PANEL WITH GFR - Abnormal; Notable for the following components:  CO2 21 (*)    Glucose, Bld 141 (*)    All other components within normal limits  URINALYSIS, COMPLETE (UACMP) WITH MICROSCOPIC - Abnormal; Notable for the following components:   Color, Urine AMBER (*)    APPearance HAZY (*)    Ketones, ur 5 (*)    Protein, ur 30 (*)    All other components within normal limits  RESP PANEL BY RT-PCR (RSV, FLU A&B, COVID)  RVPGX2  MAGNESIUM   LIPASE, BLOOD     EKG  Ecg = sinus rhythm, rate 60, no gross ST elevation or depression, no significant repolarization normality, normal axis, first-degree AV block.  No evidence of arrhythmia nor ischemia on my interpretation.   RADIOLOGY  Radiology interpreted myself and radiology report reviewed.  Concern for SBO.   PROCEDURES:  Critical Care performed: No  Procedures   MEDICATIONS ORDERED IN ED: Medications  ondansetron  (ZOFRAN -ODT) disintegrating tablet 4 mg (4 mg Oral Given 07/30/24 1814)  sodium chloride  0.9 % bolus  1,000 mL (1,000 mLs Intravenous New Bag/Given 07/30/24 1926)  iohexol  (OMNIPAQUE ) 300 MG/ML solution 100 mL (100 mLs Intravenous Contrast Given 07/30/24 1944)  levofloxacin (LEVAQUIN) tablet 750 mg (750 mg Oral Given 07/30/24 2021)  ondansetron  (ZOFRAN ) injection 4 mg (4 mg Intravenous Given 07/30/24 2028)     IMPRESSION / MDM / ASSESSMENT AND PLAN / ED COURSE  I reviewed the triage vital signs and the nursing notes.                              DDX/MDM/AP: Differential diagnosis includes, but is not limited to, enteritis/colitis, UTI/pyelonephritis, bowel obstruction, appendicitis, diverticulitis, doubt choledocholithiasis.  Doubt anginal equivalent.  Plan: - Labs EKG CT abdomen pelvis - IV fluid, Zofran  Can reassess  Patient's presentation is most consistent with acute presentation with potential threat to life or bodily function.  The patient is on the cardiac monitor to evaluate for evidence of arrhythmia and/or significant heart rate changes.  ED course below.  Workup with SBO, general surgery consulted, recommends NG tube and admission to hospitalist.  Also possible UTI, initially treated with p.o. levofloxacin.  Admitted to hospitalist service.  Clinical Course as of 07/30/24 2106  [place] Jul 30, 2024  1858 CBC with mild leukocytosis, no anemia [MM]  1910 CMP with mildly low bicarb, otherwise unremarkable [MM]  1954 Urinalysis with notable pyuria, not contaminated, concerning for UTI, given symptoms well treat for presumed pyelonephritis  Patient has multiple antibiotic allergies listed, due to this we will treat with levofloxacin [MM]  2057 CTAP: IMPRESSION: 1. Small bowel obstruction with suspected transition point in the right lower quadrant. Mesenteric edema and free fluid. 2. Aortic Atherosclerosis (ICD10-I70.0).   [MM]  2057 Paging Gen Surg [MM]  2059 D/w Dr Jordis of gen surg - Recommends NG tube and admission to medicine service [MM]  2105 Hospitalist consult  order placed [MM]    Clinical Course User Index [MM] Clarine Ozell LABOR, MD     FINAL CLINICAL IMPRESSION(S) / ED DIAGNOSES   Final diagnoses:  SBO (small bowel obstruction) (HCC)  Urinary tract infection without hematuria, site unspecified     Rx / DC Orders   ED Discharge Orders     None        Note:  This document was prepared using Dragon voice recognition software and may include unintentional dictation errors.   Clarine Ozell LABOR, MD 07/30/24 2106

## 2024-07-30 NOTE — ED Triage Notes (Signed)
 Pt comes via EMS with c/o N/V since yesterday. Pt had new pacemaker placed on 9/29. Pt denies any cp, sob or belly pain. Pt did vomit in route to hospital.   CBG 125

## 2024-07-30 NOTE — ED Notes (Signed)
 Bed alarm is on, fall risk bracelet is on, grip socks are on, and pt has call bell on R bed rail.

## 2024-07-30 NOTE — Hospital Course (Signed)
 Maria Trujillo

## 2024-07-30 NOTE — Assessment & Plan Note (Addendum)
 Holding home Synthroid  while she is n.p.o. Will resume once able to take by mouth

## 2024-07-30 NOTE — Assessment & Plan Note (Addendum)
 Chronic anticoagulation Will hold Eliquis  in case of need for surgical intervention Lovenox  DVT prophylaxis in the interim

## 2024-07-30 NOTE — Assessment & Plan Note (Signed)
 Still no BM or passing flatus.  Significant discharge in NG tube. -Continue with NG tube and intermittent suctioning -Continue with conservative management -Surgery might do Gastrografin challenge tomorrow if remained unchanged

## 2024-07-30 NOTE — Assessment & Plan Note (Addendum)
 History of sinus pauses on antiarrhythmics No acute issues suspected Patient denies chest pain, shortness of breath or lightheadedness Will get baseline troponin

## 2024-07-31 DIAGNOSIS — I480 Paroxysmal atrial fibrillation: Secondary | ICD-10-CM | POA: Diagnosis not present

## 2024-07-31 DIAGNOSIS — E46 Unspecified protein-calorie malnutrition: Secondary | ICD-10-CM | POA: Diagnosis not present

## 2024-07-31 DIAGNOSIS — I2510 Atherosclerotic heart disease of native coronary artery without angina pectoris: Secondary | ICD-10-CM | POA: Diagnosis not present

## 2024-07-31 DIAGNOSIS — I10 Essential (primary) hypertension: Secondary | ICD-10-CM | POA: Diagnosis not present

## 2024-07-31 DIAGNOSIS — R112 Nausea with vomiting, unspecified: Secondary | ICD-10-CM | POA: Diagnosis not present

## 2024-07-31 DIAGNOSIS — R188 Other ascites: Secondary | ICD-10-CM | POA: Diagnosis not present

## 2024-07-31 DIAGNOSIS — D62 Acute posthemorrhagic anemia: Secondary | ICD-10-CM | POA: Diagnosis not present

## 2024-07-31 DIAGNOSIS — Z7901 Long term (current) use of anticoagulants: Secondary | ICD-10-CM | POA: Diagnosis not present

## 2024-07-31 DIAGNOSIS — K429 Umbilical hernia without obstruction or gangrene: Secondary | ICD-10-CM | POA: Diagnosis not present

## 2024-07-31 DIAGNOSIS — K56609 Unspecified intestinal obstruction, unspecified as to partial versus complete obstruction: Secondary | ICD-10-CM | POA: Diagnosis not present

## 2024-07-31 DIAGNOSIS — K460 Unspecified abdominal hernia with obstruction, without gangrene: Secondary | ICD-10-CM | POA: Diagnosis not present

## 2024-07-31 DIAGNOSIS — K565 Intestinal adhesions [bands], unspecified as to partial versus complete obstruction: Secondary | ICD-10-CM | POA: Diagnosis not present

## 2024-07-31 DIAGNOSIS — E039 Hypothyroidism, unspecified: Secondary | ICD-10-CM | POA: Diagnosis not present

## 2024-07-31 DIAGNOSIS — K219 Gastro-esophageal reflux disease without esophagitis: Secondary | ICD-10-CM | POA: Diagnosis not present

## 2024-07-31 DIAGNOSIS — R1031 Right lower quadrant pain: Secondary | ICD-10-CM | POA: Diagnosis present

## 2024-07-31 DIAGNOSIS — Z79890 Hormone replacement therapy (postmenopausal): Secondary | ICD-10-CM | POA: Diagnosis not present

## 2024-07-31 DIAGNOSIS — Z4682 Encounter for fitting and adjustment of non-vascular catheter: Secondary | ICD-10-CM | POA: Diagnosis not present

## 2024-07-31 DIAGNOSIS — Z515 Encounter for palliative care: Secondary | ICD-10-CM | POA: Diagnosis not present

## 2024-07-31 DIAGNOSIS — K921 Melena: Secondary | ICD-10-CM | POA: Diagnosis not present

## 2024-07-31 DIAGNOSIS — I4821 Permanent atrial fibrillation: Secondary | ICD-10-CM

## 2024-07-31 DIAGNOSIS — Z1152 Encounter for screening for COVID-19: Secondary | ICD-10-CM | POA: Diagnosis not present

## 2024-07-31 DIAGNOSIS — Z955 Presence of coronary angioplasty implant and graft: Secondary | ICD-10-CM | POA: Diagnosis not present

## 2024-07-31 DIAGNOSIS — K567 Ileus, unspecified: Secondary | ICD-10-CM | POA: Diagnosis not present

## 2024-07-31 DIAGNOSIS — Z79899 Other long term (current) drug therapy: Secondary | ICD-10-CM | POA: Diagnosis not present

## 2024-07-31 DIAGNOSIS — N390 Urinary tract infection, site not specified: Secondary | ICD-10-CM | POA: Diagnosis not present

## 2024-07-31 DIAGNOSIS — R1030 Lower abdominal pain, unspecified: Secondary | ICD-10-CM | POA: Diagnosis not present

## 2024-07-31 DIAGNOSIS — K55019 Acute (reversible) ischemia of small intestine, extent unspecified: Secondary | ICD-10-CM | POA: Diagnosis not present

## 2024-07-31 DIAGNOSIS — Z886 Allergy status to analgesic agent status: Secondary | ICD-10-CM | POA: Diagnosis not present

## 2024-07-31 DIAGNOSIS — Z23 Encounter for immunization: Secondary | ICD-10-CM | POA: Diagnosis not present

## 2024-07-31 DIAGNOSIS — Z950 Presence of cardiac pacemaker: Secondary | ICD-10-CM | POA: Diagnosis not present

## 2024-07-31 DIAGNOSIS — E871 Hypo-osmolality and hyponatremia: Secondary | ICD-10-CM | POA: Diagnosis not present

## 2024-07-31 DIAGNOSIS — E785 Hyperlipidemia, unspecified: Secondary | ICD-10-CM | POA: Diagnosis not present

## 2024-07-31 DIAGNOSIS — K529 Noninfective gastroenteritis and colitis, unspecified: Secondary | ICD-10-CM | POA: Diagnosis not present

## 2024-07-31 DIAGNOSIS — E440 Moderate protein-calorie malnutrition: Secondary | ICD-10-CM | POA: Diagnosis not present

## 2024-07-31 LAB — BASIC METABOLIC PANEL WITH GFR
Anion gap: 8 (ref 5–15)
BUN: 19 mg/dL (ref 8–23)
CO2: 22 mmol/L (ref 22–32)
Calcium: 9.6 mg/dL (ref 8.9–10.3)
Chloride: 109 mmol/L (ref 98–111)
Creatinine, Ser: 0.56 mg/dL (ref 0.44–1.00)
GFR, Estimated: >60 mL/min (ref >60)
Glucose, Bld: 132 mg/dL — ABNORMAL HIGH (ref 70–99)
Potassium: 3.9 mmol/L (ref 3.5–5.1)
Sodium: 139 mmol/L (ref 135–145)

## 2024-07-31 LAB — CBC
HCT: 36.5 % (ref 36.0–46.0)
Hemoglobin: 12.3 g/dL (ref 12.0–15.0)
MCH: 30.8 pg (ref 26.0–34.0)
MCHC: 33.7 g/dL (ref 30.0–36.0)
MCV: 91.5 fL (ref 80.0–100.0)
Platelets: 148 K/uL — ABNORMAL LOW (ref 150–400)
RBC: 3.99 MIL/uL (ref 3.87–5.11)
RDW: 13.6 % (ref 11.5–15.5)
WBC: 11.3 K/uL — ABNORMAL HIGH (ref 4.0–10.5)
nRBC: 0 % (ref 0.0–0.2)

## 2024-07-31 LAB — TROPONIN I (HIGH SENSITIVITY): Troponin I (High Sensitivity): 11 ng/L (ref <18)

## 2024-07-31 NOTE — Plan of Care (Signed)
   Problem: Education: Goal: Knowledge of General Education information will improve Description Including pain rating scale, medication(s)/side effects and non-pharmacologic comfort measures Outcome: Progressing   Problem: Clinical Measurements: Goal: Ability to maintain clinical measurements within normal limits will improve Outcome: Progressing   Problem: Activity: Goal: Risk for activity intolerance will decrease Outcome: Progressing   Problem: Nutrition: Goal: Adequate nutrition will be maintained Outcome: Progressing   Problem: Safety: Goal: Ability to remain free from injury will improve Outcome: Progressing   Problem: Skin Integrity: Goal: Risk for impaired skin integrity will decrease Outcome: Progressing

## 2024-07-31 NOTE — TOC CM/SW Note (Signed)
 Transition of Care [facility]) CM/SW Note    Transition of Care [facility]) - Inpatient Brief Assessment   Patient Details  Name: Maria Trujillo MRN: [id number] Date of Birth: 1933-12-21  Transition of Care [facility]) CM/SW Contact:    Alfonso Rummer, LCSW Phone Number: 07/31/2024, 12:20 PM   Clinical Narrative:  KEN DELENA Rummer completed TOC chart review. No TOC needs identified. Please contact should needs arise.   Transition of Care Asessment: Insurance and Status: Insurance coverage has been reviewed Patient has primary care physician: Yes [facility], [facility]) Home environment has been reviewed: single family home   Prior/Current Home Services: No current home services Social Drivers of Health Review: SDOH reviewed no interventions necessary Readmission risk has been reviewed: Yes Transition of care needs: no transition of care needs at this time

## 2024-07-31 NOTE — Hospital Course (Addendum)
 Partly taken from H&P.  Maria Trujillo is a 89 y.o. female with medical history significant for CAD with prior NSTEMI, HTN, HLD, persistent A-fib on Eliquis  and amiodarone , complicated by post conversion  pauses/bradycardia with syncope 04/2024 s/p pacemaker on 07/24/2024 -(Eliquis  held 9/26 to 9/30), being admitted for small bowel obstruction.  She presented with intractable nonbloody, nonbilious, non-coffee-ground vomiting starting on the night prior to arrival associated with lower abdominal pain.   On presentation stable vitals, labs with leukocytosis of 12.4 and UA with some ketones otherwise stable labs.  CT abdomen and pelvis showed SBO with suspected transition point in the right lower quadrant and mesenteric edema with free fluid.  NG tube was placed and general surgery was consulted.  10/6: Vital stable, improving leukocytosis now at 11.3, Significant drainage of about 1700 cc with NG tube. Continuing conservative management and general surgery might do Gastrografin challenge tomorrow if remained unchanged.  10/7: Vital stable, still no bowel movement or flatus, repeat KUB with persistently dilated loops of bowel with scattered air-fluid levels.  Continuing with conservative management, general surgery might try clamping NG tube today.  10/8: Hemodynamically stable, NG tube was removed and patient was started on clear liquid diet, still no BM or flatus, KUB with persistently dilated loops of bowel.  Surgery would like to give her a trial of some p.o. intake before doing Gastrografin challenge.  Surgery later decided to take her to the OR tomorrow morning, failed Gastrografin challenge.  10/9: Vital stable, s/p exploratory laparotomy with reduction of internal hernia and resection of small bowel with primary anastomosis and adhesion lysis. General surgery wants her to be started on TPN as expecting prolonged ileus, patient recently had a pacemaker in place and cardiology is little hesitant  to use a central line.  IR was consulted to avoid pacemaker leads.  10/10: Hemodynamically stable but still no bowel function.  IR was consulted for central line placement because of her recent pacemaker, followed by starting TPN.  Foley catheter was removed to give her a voiding trial.  Patient is very frail, consulting palliative care to clarify goals of care.  Currently full code.  10/16: Vital stable, started getting bowel function and NG tube was removed on 10/13.  Patient was slowly started on clear liquid diet and advance to soft.  Started tapering TPN today.  Patient received 1 unit of PRBC on 10/15 for concern of melena and Eliquis  was held.  IV amiodarone  is being transition to p.o. today.  PT is recommending SNF but patient would like to go home with daughter, maximum home health services ordered.  10/17: Hemodynamically stable, had a bowel movement and tolerating diet.  Worsening leukocytosis so repeat CT abdomen was obtained with pending results, no obvious abscess seen on preliminary.  Surgery would like to keep holding anticoagulation for 2 weeks, cardiology is aware and they will restart as outpatient.  10/18: Hemodynamically stable, had a BM, persistent leukocytosis at 14.7.  Repeat abdominal CT yesterday with post operative changes, some free and organizing fluid in pelvis but no discrete abscesses.  Did show some concern of colitis involving the transverse, descending and rectosigmoid colon.  Discussed with general surgery and according to him low concern of any infection so they will hold off to any further antibiotics at this time.  Likely reactive after the surgery.  Patient is tolerating diet and requesting to go home.  Patient has an history of low tolerance of metoprolol  and was feeling funny after taking it  so it was held.  Case was also discussed with cardiology.  She will continue with amiodarone  as directed which include loading dose.  We are holding Eliquis  for  another 10 days as advised by general surgery.  Her home lisinopril , Lasix  and Imdur  was discontinued due to borderline soft blood pressure and her outpatient provider can restart when appropriate.  Patient will continue on current medications and need to have a close follow-up with her providers for further assistance.

## 2024-07-31 NOTE — Progress Notes (Signed)
  Progress Note   Patient: Maria Trujillo FMW:[id number] DOB: 11/07/1933 DOA: 07/30/2024     0 DOS: the patient was seen and examined on 07/31/2024   Brief hospital course: Partly taken from H&P.  RIKITA GRABERT is a 89 y.o. female with medical history significant for CAD with prior NSTEMI, HTN, HLD, persistent A-fib on Eliquis  and amiodarone , complicated by post conversion  pauses/bradycardia with syncope 04/2024 s/p pacemaker on 07/24/2024 -(Eliquis  held 9/26 to 9/30), being admitted for small bowel obstruction.  She presented with intractable nonbloody, nonbilious, non-coffee-ground vomiting starting on the night prior to arrival associated with lower abdominal pain.   On presentation stable vitals, labs with leukocytosis of 12.4 and UA with some ketones otherwise stable labs.  CT abdomen and pelvis showed SBO with suspected transition point in the right lower quadrant and mesenteric edema with free fluid.  NG tube was placed and general surgery was consulted.  10/6: Vital stable, improving leukocytosis now at 11.3, Significant drainage of about 1700 cc with NG tube. Continuing conservative management and general surgery might do Gastrografin challenge tomorrow if remained unchanged.  Assessment and Plan: * SBO (small bowel obstruction) (HCC) Still no BM or passing flatus.  Significant discharge in NG tube. -Continue with NG tube and intermittent suctioning -Continue with conservative management -Surgery might do Gastrografin challenge tomorrow if remained unchanged  Coronary artery disease with history of NSTEMI Continue atorvastatin , Imdur  and lisinopril  when tolerating n.p.o.  Status cardiac pacemaker 07/24/2024 for sinus pause with syncope History of sinus pauses on antiarrhythmics No acute issues suspected Patient denies chest pain, shortness of breath or lightheadedness Troponin negative.  Permanent atrial fibrillation (HCC) Chronic anticoagulation Will hold Eliquis  in case of  need for surgical intervention Lovenox  DVT prophylaxis in the interim  HTN (hypertension) Hydralazine  as needed while NPO  Acquired hypothyroidism Holding home Synthroid  while she is n.p.o. Will resume once able to take by mouth   Subjective: Patient was still having mild lower abdominal pain, no bowel movement or passing flatus.  Physical Exam: Vitals:   07/30/24 2300 07/31/24 0344 07/31/24 0743 07/31/24 1120  BP: (!) 155/65 (!) 138/58 (!) 140/59 (!) 142/63  Pulse: 61 65 60 61  Resp: 16 16 19 20   Temp: 98.2 F (36.8 C) 98.3 F (36.8 C) 98.3 F (36.8 C) 97.6 F (36.4 C)  TempSrc:   Oral Oral  SpO2: 98% 93% 95% 95%  Weight:      Height:       General.  Ill-appearing, frail elderly lady, in no acute distress. Pulmonary.  Lungs clear bilaterally, normal respiratory effort. CV.  Regular rate and rhythm, no JVD, rub or murmur. Abdomen.  Soft, mild lower abdominal tenderness, nondistended, BS hyperactive. CNS.  Alert and oriented .  No focal neurologic deficit. Extremities.  No edema, no cyanosis, pulses intact and symmetrical.   Data Reviewed: Prior data reviewed  Family Communication: Talked with daughter on phone.  Disposition: Status is: Inpatient Remains inpatient appropriate because: Severity of illness  Planned Discharge Destination: Home  DVT prophylaxis.  Lovenox  Time spent: 50 minutes  This record has been created using Conservation officer, historic buildings. Errors have been sought and corrected,but may not always be located. Such creation errors do not reflect on the standard of care.   Author: Amaryllis Dare, MD 07/31/2024 11:45 AM  For on call review [email or web address].

## 2024-07-31 NOTE — Consult Note (Signed)
 [facility] SURGICAL CONSULTATION NOTE (initial) - cpt: 00756   HISTORY OF PRESENT ILLNESS (HPI):  89 y.o. female presented to [facility] ED last night for evaluation of abdominal pain. Patient reports the initial onset of RLQ abdominal pain day prior to presentation. This was accompanied with nausea and emesis. She initially felt some relief but then symptoms recurred which prompted presentation. No fever, chills, CP, SOB, urinary changes. She did have a BM in the last 24 hours. Previous abdominal surgeries positive for cholecystectomy. Work up in the ED revealed a leukocytosis to 12.4K, Hgb to 12.6, sCr - 0.66, no electrolyte derangements, lipase 31. She did have CT Abdomen/Pelvis concerning for SBO. She was admitted to medicine. NGT placed with 1700 ccs recorded.   Surgery is consulted by emergency medicine physician Dr. Ozell Klein, MD in this context for evaluation and management of SBO.  PAST MEDICAL HISTORY (PMH):  Past Medical History:  Diagnosis Date   Arthritis    Atrial fibrillation (HCC)    History of kidney stones    Hypothyroidism    Myocardial infarct (HCC)    PONV (postoperative nausea and vomiting)      PAST SURGICAL HISTORY (PSH):  Past Surgical History:  Procedure Laterality Date   BREAST EXCISIONAL BIOPSY Bilateral 1984   benign   CARDIOVERSION N/A 03/04/2023   Procedure: CARDIOVERSION;  Surgeon: Ammon Blunt, MD;  Location: [facility] ORS;  Service: Cardiovascular;  Laterality: N/A;   CATARACT EXTRACTION, BILATERAL     CHOLECYSTECTOMY     EYE SURGERY     JOINT REPLACEMENT Left 12/2012   knee   KNEE ARTHROPLASTY Right 02/15/2017   Procedure: COMPUTER ASSISTED TOTAL KNEE ARTHROPLASTY;  Surgeon: Lynwood SHAUNNA Hue, MD;  Location: [facility] ORS;  Service: Orthopedics;  Laterality: Right;   LEFT HEART CATH AND CORONARY ANGIOGRAPHY N/A 11/04/2020   Procedure: LEFT HEART CATH AND CORONARY ANGIOGRAPHY;  Surgeon: Ammon Blunt, MD;  Location: [facility] INVASIVE CV  LAB;  Service: Cardiovascular;  Laterality: N/A;   PACEMAKER IMPLANT N/A 07/24/2024   Procedure: PACEMAKER IMPLANT;  Surgeon: Cindie Ole DASEN, MD;  Location[facility] INVASIVE CV LAB;  Service: Cardiovascular;  Laterality: N/A;   PHOTOCOAGULATION WITH LASER Right 04/20/2018   Procedure: TRANSCLERAL DIODE CYCLOPHOTOCOAGULATION  RIGHT per [place] block;  Surgeon: Mittie Gaskin, MD;  Location: [facility];  Service: Ophthalmology;  Laterality: Right;   ROTATOR CUFF REPAIR Left 2010   TONSILLECTOMY       MEDICATIONS:  Prior to Admission medications   Medication Sig Start Date End Date Taking? Authorizing Provider  acetaminophen  (TYLENOL ) 650 MG CR tablet Take 650 mg by mouth daily as needed for pain.   Yes [provider]  amiodarone  (PACERONE ) 200 MG tablet Take 2 tablets (400 mg total) by mouth 2 (two) times daily for 10 days, THEN 1 tablet (200 mg total) daily. Patient taking differently:  (200 mg total) daily. 05/24/24 07/30/24 Yes Awanda City, MD  apixaban  (ELIQUIS ) 5 MG TABS tablet Take 1 tablet (5 mg total) by mouth 2 (two) times daily. 07/16/23 07/30/24 Yes Riddle, Suzann, NP  atorvastatin  (LIPITOR ) 40 MG tablet Take 40 mg by mouth at bedtime.   Yes [provider]  azelastine  (ASTELIN ) 0.1 % nasal spray Place 1 spray into both nostrils daily. 11/13/21  Yes [provider]  bimatoprost (LUMIGAN) 0.03 % ophthalmic solution Place 1 drop into the right eye at bedtime.   Yes [provider]  Calcium  Carb-Cholecalciferol  (CALCIUM  CARBONATE-VITAMIN D3) 600-400 MG-UNIT TABS Take 1 tablet by  mouth daily.   Yes [provider]  Carboxymethylcellulose Sodium (THERATEARS) 0.25 % SOLN Place 1 drop into both eyes daily as needed (Dry eyes).   Yes [provider]  Cholecalciferol  (VITAMIN D ) 50 MCG (2000 UT) tablet Take 2,000 Units by mouth daily.   Yes [provider]  Docusate Sodium  100 MG capsule Take 100 mg by mouth daily as needed for  mild constipation or moderate constipation.   Yes [provider]  fexofenadine (ALLEGRA) 180 MG tablet Take 180 mg by mouth at bedtime.   Yes [provider]  folic acid (FOLVITE) 400 MCG tablet Take 400 mcg by mouth daily.   Yes [provider]  ipratropium (ATROVENT) 0.06 % nasal spray Place 1 spray into both nostrils daily as needed for rhinitis.   Yes [provider]  isosorbide  mononitrate (IMDUR ) 30 MG 24 hr tablet Take 1 tablet (30 mg total) by mouth daily. 11/06/20 07/30/24 Yes Awanda City, MD  levothyroxine  (SYNTHROID , LEVOTHROID) 100 MCG tablet Take 100 mcg by mouth daily before breakfast.   Yes [provider]  lisinopril  (ZESTRIL ) 5 MG tablet Take 1 tablet (5 mg total) by mouth daily. 11/06/20 07/30/24 Yes Awanda City, MD  LUMIGAN 0.01 % SOLN Place 1 drop into the right eye at bedtime. 06/15/24  Yes [provider]  metoprolol  tartrate (LOPRESSOR ) 25 MG tablet Take 0.5 tablets (12.5 mg total) by mouth daily as needed. for heart rate over 100.  Home med. 05/24/24  Yes Awanda City, MD  Multiple Vitamin (MULTIVITAMIN WITH MINERALS) TABS tablet Take 1 tablet by mouth daily.   Yes [provider]  Multiple Vitamins-Minerals (PRESERVISION AREDS 2 PO) Take 2 tablets by mouth 2 (two) times daily.   Yes [provider]  pantoprazole  (PROTONIX ) 40 MG tablet Take 40 mg by mouth daily.   Yes [provider]  prednisoLONE acetate (PRED FORTE) 1 % ophthalmic suspension Place 1 drop into the right eye 4 (four) times daily. 06/22/24  Yes [provider]  timolol  (TIMOPTIC ) 0.5 % ophthalmic solution Place 1 drop into the right eye in the morning. 10/28/20  Yes [provider]  vitamin B-12 (CYANOCOBALAMIN ) 100 MCG tablet Take 100 mcg by mouth daily.   Yes [provider]  furosemide  (LASIX ) 20 MG tablet Take 20 mg by mouth daily. prn Patient not taking: Reported on 07/21/2024 01/04/23 07/10/24  [provider]     ALLERGIES:  Allergies  Allergen Reactions   Celecoxib Hives   Dorzolamide Shortness Of Breath    Other Reaction(s): Other (See Comments)  Pt stated had reactions but unable to provide information   Penicillins Other (See Comments), Hives and Rash    Redness (sunburn type rash with peeling)  Has patient had a PCN reaction causing immediate rash, facial/tongue/throat swelling, SOB or lightheadedness with hypotension: No  Has patient had a PCN reaction causing severe rash involving mucus membranes or skin necrosis: No  Has patient had a PCN reaction that required hospitalization No  Has patient had a PCN reaction occurring within the last 10 years: Yes  If all of the above answers are NO, then may proceed with Cephalosporin use.  Other Reaction(s): Other (See Comments)  Redness (sunburn type rash with peeling)  Has patient had a PCN reaction causing immediate rash, facial/tongue/throat swelling, SOB or lightheadedness with hypotension: No  Has patient had a PCN reaction causing severe rash involving mucus membranes or skin necrosis: No  Has patient had a PCN reaction  that required hospitalization No  Has patient had a PCN reaction occurring within the last 10 years: Yes  If all of the above answers are NO, then may proceed with Cephalosporin use.  Redness and flushing  Redness and flushing  Redness (sunburn type rash with peeling)   Shellfish Allergy Hives and Itching    All seafood   Timolol  Maleate Other (See Comments) and Shortness Of Breath    Redness  Other Reaction(s): Other (See Comments)  Shortness of breath  Redness  Redness  Higher Doses  Shortness of breath    Redness   Sulfa Antibiotics Other (See Comments)    Redness in eyes   Amoxicillin Rash   Amoxicillin-Pot Clavulanate Other (See Comments)    Redness and flushing   Brimonidine Other (See Comments)    blisters   Cefuroxime Axetil Other (See Comments)    redness   Dipivefrin Other (See  Comments)    sleepiness   Oxycodone      BP dropped  Other Reaction(s): Other (See Comments)  Dropped BP down too low  Dropped blood pressure   BP dropped  Dropped BP down too low     SOCIAL HISTORY:  Social History   Socioeconomic History   Marital status: Widowed    Spouse name: Not on file   Number of children: Not on file   Years of education: Not on file   Highest education level: Not on file  Occupational History   Not on file  Tobacco Use   Smoking status: Never   Smokeless tobacco: Never  Vaping Use   Vaping status: Never Used  Substance and Sexual Activity   Alcohol  use: No   Drug use: No   Sexual activity: Not on file  Other Topics Concern   Not on file  Social History Narrative   Not on file   Social Drivers of Health   Financial Resource Strain: Low Risk  (01/20/2024)   Received from [facility]   Overall Financial Resource Strain (CARDIA)    Difficulty of Paying Living Expenses: Not hard at all  Food Insecurity: No Food Insecurity (07/30/2024)   Hunger Vital Sign    Worried About Running Out of Food in the Last Year: Never true    Ran Out of Food in the Last Year: Never true  Transportation Needs: No Transportation Needs (07/30/2024)   PRAPARE - Administrator, Civil Service (Medical): No    Lack of Transportation (Non-Medical): No  Physical Activity: Inactive (11/03/2018)   Received from [facility]   Exercise Vital Sign    Days of Exercise per Week: 0 days    Minutes of Exercise per Session: 0 min  Stress: No Stress Concern Present (11/03/2018)   Received from [facility] - Occupational Stress Questionnaire    Feeling of Stress : Not at all  Social Connections: Moderately Isolated (07/30/2024)   Social Connection and Isolation Panel    Frequency of Communication with Friends and Family: Three times a week    Frequency of Social Gatherings with Friends and Family: Once  a week    Attends Religious Services: More than 4 times per year    Active Member of [employer] or Organizations: No    Attends Banker Meetings: Never    Marital Status: Widowed  Intimate Partner Violence: Not At Risk (07/30/2024)   Humiliation, Afraid, Rape, and Kick questionnaire    Fear of Current  or Ex-Partner: No    Emotionally Abused: No    Physically Abused: No    Sexually Abused: No     FAMILY HISTORY:  Family History  Problem Relation Age of Onset   Breast cancer Sister 66      REVIEW OF SYSTEMS:  Review of Systems  Constitutional:  Negative for chills and fever.  Respiratory:  Negative for cough and shortness of breath.   Cardiovascular:  Negative for chest pain and palpitations.  Gastrointestinal:  Positive for abdominal pain, nausea and vomiting. Negative for constipation and diarrhea.  Genitourinary:  Negative for dysuria and urgency.  All other systems reviewed and are negative.   VITAL SIGNS:  Temp:  [98.2 F (36.8 C)-98.3 F (36.8 C)] 98.3 F (36.8 C) (10/06 0344) Pulse Rate:  [60-66] 65 (10/06 0344) Resp:  [16-20] 16 (10/06 0344) BP: (121-155)/(53-75) 138/58 (10/06 0344) SpO2:  [93 %-99 %] 93 % (10/06 0344) Weight:  [74.4 kg] 74.4 kg (10/05 1756)     Height: 5' 4 (162.6 cm) Weight: 74.4 kg BMI (Calculated): 28.14   INTAKE/OUTPUT:  10/05 0701 - 10/06 0700 In: 297.1 [I.V.:297.1] Out: 1700 [Emesis/NG output:1700]  PHYSICAL EXAM:  Physical Exam Vitals and nursing note reviewed. Exam conducted with a chaperone present.  Constitutional:      General: She is not in acute distress.    Appearance: Normal appearance. She is not ill-appearing.     Comments: Resting in bed; NAD. Daughter at bedside   HENT:     Head: Normocephalic and atraumatic.  Eyes:     General: No scleral icterus.    Conjunctiva/sclera: Conjunctivae normal.  Cardiovascular:     Rate and Rhythm: Normal rate.     Pulses: Normal pulses.     Heart sounds: No murmur  heard. Pulmonary:     Effort: Pulmonary effort is normal. No respiratory distress.  Abdominal:     General: Abdomen is flat. A surgical scar is present. There is no distension.     Palpations: Abdomen is soft.     Tenderness: There is no abdominal tenderness. There is no guarding or rebound.     Comments: Abdomen is soft, she is non-tender, does not appear overtly distended, no rebound/guarding.   Genitourinary:    Comments: Deferred Skin:    General: Skin is warm and dry.     Coloration: Skin is not jaundiced.  Neurological:     General: No focal deficit present.     Mental Status: She is alert and oriented to person, place, and time.  Psychiatric:        Mood and Affect: Mood normal.      Labs:     Latest Ref Rng & Units 07/31/2024    4:15 AM 07/30/2024    6:00 PM 07/06/2024    9:15 PM  CBC  WBC 4.0 - 10.5 K/uL 11.3  12.4  6.9   Hemoglobin 12.0 - 15.0 g/dL 87.6  87.3  88.9   Hematocrit 36.0 - 46.0 % 36.5  39.1  34.2   Platelets 150 - 400 K/uL 148  158  159       Latest Ref Rng & Units 07/31/2024    4:15 AM 07/30/2024    6:00 PM 07/06/2024    9:15 PM  CMP  Glucose 70 - 99 mg/dL 867  858  892   BUN 8 - 23 mg/dL 19  18  19    Creatinine 0.44 - 1.00 mg/dL 9.43  9.33  9.05   Sodium  135 - 145 mmol/L 139  137  141   Potassium 3.5 - 5.1 mmol/L 3.9  3.9  4.0   Chloride 98 - 111 mmol/L 109  103  108   CO2 22 - 32 mmol/L 22  21  23    Calcium  8.9 - 10.3 mg/dL 9.6  9.8  8.4   Total Protein 6.5 - 8.1 g/dL  7.1  5.5   Total Bilirubin 0.0 - 1.2 mg/dL  1.1  0.7   Alkaline Phos 38 - 126 U/L  89  69   AST 15 - 41 U/L  24  24   ALT 0 - 44 U/L  18  16     Imaging studies:   CT Abdomen/Pelvis (07/31/2023) personally reviewed with dilated loops of small bowel, transition noted in RLQ, no free air, and radiologist report reviewed below: IMPRESSION: 1. Small bowel obstruction with suspected transition point in the right lower quadrant. Mesenteric edema and free fluid. 2. Aortic  Atherosclerosis (ICD10-I70.0).   Assessment/Plan: (ICD-10's: K54.609) 89 y.o. female with small bowel obstruction, complicated by pertinent comorbidities including advanced age, need for anticoagulation.   - Appreciate medicine admission    - Agree with NGT decompression; LIS; monitor and record output  - No need for emergent surgical intervention - Monitor abdominal examination; on-going bowel function    - Serial KUB as needed; May consider gastrografin challenge tomorrow if no significant improvement.  - Pain control prn; antiemetics prn  - Okay to mobilize; may clamp NGT to allow this  - Further management per primary service; we will follow   All of the above findings and recommendations were discussed with the patient and her family (daughter at bedside), and all of patient's and her family's questions were answered to their expressed satisfaction.  Thank you for the opportunity to participate in this patient's care.   -- Arthea Platt, PA-C [facility] 07/31/2024, 7:26 AM M-F: 7am - 4pm

## 2024-08-01 ENCOUNTER — Inpatient Hospital Stay

## 2024-08-01 DIAGNOSIS — I4821 Permanent atrial fibrillation: Secondary | ICD-10-CM | POA: Diagnosis not present

## 2024-08-01 DIAGNOSIS — K56609 Unspecified intestinal obstruction, unspecified as to partial versus complete obstruction: Secondary | ICD-10-CM | POA: Diagnosis not present

## 2024-08-01 DIAGNOSIS — Z7901 Long term (current) use of anticoagulants: Secondary | ICD-10-CM | POA: Diagnosis not present

## 2024-08-01 DIAGNOSIS — Z950 Presence of cardiac pacemaker: Secondary | ICD-10-CM | POA: Diagnosis not present

## 2024-08-01 LAB — BASIC METABOLIC PANEL WITH GFR
Anion gap: 6 (ref 5–15)
BUN: 29 mg/dL — ABNORMAL HIGH (ref 8–23)
CO2: 25 mmol/L (ref 22–32)
Calcium: 8.9 mg/dL (ref 8.9–10.3)
Chloride: 108 mmol/L (ref 98–111)
Creatinine, Ser: 0.69 mg/dL (ref 0.44–1.00)
GFR, Estimated: >60 mL/min (ref >60)
Glucose, Bld: 97 mg/dL (ref 70–99)
Potassium: 3.9 mmol/L (ref 3.5–5.1)
Sodium: 139 mmol/L (ref 135–145)

## 2024-08-01 LAB — CBC
HCT: 36.4 % (ref 36.0–46.0)
Hemoglobin: 12.2 g/dL (ref 12.0–15.0)
MCH: 30.5 pg (ref 26.0–34.0)
MCHC: 33.5 g/dL (ref 30.0–36.0)
MCV: 91 fL (ref 80.0–100.0)
Platelets: 142 K/uL — ABNORMAL LOW (ref 150–400)
RBC: 4 MIL/uL (ref 3.87–5.11)
RDW: 13.5 % (ref 11.5–15.5)
WBC: 8 K/uL (ref 4.0–10.5)
nRBC: 0 % (ref 0.0–0.2)

## 2024-08-01 LAB — MAGNESIUM: Magnesium: 2.1 mg/dL (ref 1.7–2.4)

## 2024-08-01 NOTE — Progress Notes (Addendum)
 Mobility Specialist Progress Note:    08/01/24 1105  Mobility  Activity Ambulated with assistance  Level of Assistance Contact guard assist, steadying assist  Assistive Device Cane  Distance Ambulated (ft) 40 ft  Range of Motion/Exercises Active;All extremities  LUE Weight Bearing Per Provider Order NWB  Activity Response Tolerated well  Mobility visit 1 Mobility  Mobility Specialist Start Time (ACUTE ONLY) 1039  Mobility Specialist Stop Time (ACUTE ONLY) 1055  Mobility Specialist Time Calculation (min) (ACUTE ONLY) 16 min   Pt received in bed, requesting assistance to bathroom and agreeable to further mobility. Required CGA to stand and ambulate with cane. Tolerated well, rate of perceived exertion 3-5/10 on the modified borg scale. HR range 65-70 bpm throughout session, SpO2 96% on RA. Returned pt supine, alarm on. HOB >40 degrees, all needs met.   Sherrilee Ditty Mobility Specialist Please contact via Special educational needs teacher or  Rehab office at [phone]

## 2024-08-01 NOTE — Progress Notes (Signed)
 [facility] SURGICAL PROGRESS NOTE (cpt [phone])  Hospital Day(s): 1.   Interval History: Patient seen and examined, no acute events or new complaints overnight. Patient reports she is feeling better. He abdominal soreness and distension are improved. No fever, chills, nausea. Previous leukocytosis resolved; WBC 8.0K. Hgb to 12.2; stable. Renal function normal; sCr - 0.69; UO - unmeasured. No electrolyte derangements. NGT with 950 ccs out nothing recorded overnight; no significant output this AM. KUB without air filled small bowel; question of gas in colon  Review of Systems:  Constitutional: denies fever, chills  HEENT: denies cough or congestion  Respiratory: denies any shortness of breath  Cardiovascular: denies chest pain or palpitations  Gastrointestinal: denies abdominal pain, N/V Genitourinary: denies burning with urination or urinary frequency Musculoskeletal: denies pain, decreased motor or sensation  Vital signs in last 24 hours: [min-max] current  Temp:  [97.6 F (36.4 C)-98.5 F (36.9 C)] 98.4 F (36.9 C) (10/07 0336) Pulse Rate:  [60-61] 61 (10/07 0336) Resp:  [16-21] 16 (10/07 0336) BP: (132-146)/(58-64) 132/64 (10/07 0336) SpO2:  [93 %-96 %] 95 % (10/07 0336)     Height: 5' 4 (162.6 cm) Weight: 74.4 kg BMI (Calculated): 28.14   Intake/Output last 2 shifts:  10/06 0701 - 10/07 0700 In: 0  Out: 950 [Emesis/NG output:950]   Physical Exam:  Constitutional: alert, cooperative and no distress  HENT: normocephalic without obvious abnormality; NGT in place (clamped) Eyes: PERRL, EOM's grossly intact and symmetric  Respiratory: breathing non-labored at rest  Cardiovascular: regular rate and sinus rhythm  Gastrointestinal: soft, mild upper abdominal soreness improved from yesterday, distension improved, no rebound/guarding    Labs:     Latest Ref Rng & Units 08/01/2024    4:01 AM 07/31/2024    4:15 AM 07/30/2024    6:00 PM  CBC  WBC 4.0 - 10.5 K/uL 8.0   11.3  12.4   Hemoglobin 12.0 - 15.0 g/dL 87.7  87.6  87.3   Hematocrit 36.0 - 46.0 % 36.4  36.5  39.1   Platelets 150 - 400 K/uL 142  148  158       Latest Ref Rng & Units 08/01/2024    4:01 AM 07/31/2024    4:15 AM 07/30/2024    6:00 PM  CMP  Glucose 70 - 99 mg/dL 97  867  858   BUN 8 - 23 mg/dL 29  19  18    Creatinine 0.44 - 1.00 mg/dL 9.30  9.43  9.33   Sodium 135 - 145 mmol/L 139  139  137   Potassium 3.5 - 5.1 mmol/L 3.9  3.9  3.9   Chloride 98 - 111 mmol/L 108  109  103   CO2 22 - 32 mmol/L 25  22  21    Calcium  8.9 - 10.3 mg/dL 8.9  9.6  9.8   Total Protein 6.5 - 8.1 g/dL   7.1   Total Bilirubin 0.0 - 1.2 mg/dL   1.1   Alkaline Phos 38 - 126 U/L   89   AST 15 - 41 U/L   24   ALT 0 - 44 U/L   18      Imaging studies:   KUB (08/01/2024) personally reviewed with question of gas in colon, no gross small bowel dilation, and radiologist report pending...   Assessment/Plan: (ICD-10's: K60.609) 89 y.o. female with small bowel obstruction, complicated by pertinent comorbidities including advanced age, need for anticoagulation.                -  We will proceed with NGT clamp trial x4 hours given improvement in exam and KUB this AM. IF after 4 hours residuals are <150 ccs, we can consider removal of NGT  - Continue NPO for now pending clamping trial             - No need for emergent surgical intervention - Monitor abdominal examination; on-going bowel function               - Serial KUB as needed; If she fails NGT clamp trial, we will proceed with gastrografin challenge             - Pain control prn; antiemetics prn             - Okay to mobilize; may clamp NGT to allow this             - Further management per primary service; we will follow   All of the above findings and recommendations were discussed with the patient, and the medical team, and all of patient's questions were answered to her expressed satisfaction.  -- Arthea Platt, PA-C  [facility] 08/01/2024, 7:28 AM M-F: 7am - 4pm

## 2024-08-01 NOTE — Assessment & Plan Note (Addendum)
 S/p laparotomy with some bowel resection and adhesion of lysis along with internal hernia reduction on 08/03/2024.  NG tube eventually removed on 10/31 patient was started on diet, currently tolerating soft diet with bowel function. TPN is being tapered off. - Continue supportive care

## 2024-08-01 NOTE — Plan of Care (Signed)
  Problem: Education: Goal: Knowledge of General Education information will improve Description: Including pain rating scale, medication(s)/side effects and non-pharmacologic comfort measures 08/01/2024 1901 by Steve Lonell PARAS, RN Outcome: Progressing 08/01/2024 1901 by Steve Lonell PARAS, RN Outcome: Progressing   Problem: Health Behavior/Discharge Planning: Goal: Ability to manage health-related needs will improve 08/01/2024 1901 by Steve Lonell PARAS, RN Outcome: Progressing 08/01/2024 1901 by Steve Lonell PARAS, RN Outcome: Progressing   Problem: Clinical Measurements: Goal: Ability to maintain clinical measurements within normal limits will improve 08/01/2024 1901 by Steve Lonell PARAS, RN Outcome: Progressing 08/01/2024 1901 by Steve Lonell PARAS, RN Outcome: Progressing Goal: Will remain free from infection 08/01/2024 1901 by Steve Lonell PARAS, RN Outcome: Progressing 08/01/2024 1901 by Steve Lonell PARAS, RN Outcome: Progressing Goal: Diagnostic test results will improve 08/01/2024 1901 by Steve Lonell PARAS, RN Outcome: Progressing 08/01/2024 1901 by Steve Lonell PARAS, RN Outcome: Progressing Goal: Respiratory complications will improve 08/01/2024 1901 by Steve Lonell PARAS, RN Outcome: Progressing 08/01/2024 1901 by Steve Lonell PARAS, RN Outcome: Progressing Goal: Cardiovascular complication will be avoided 08/01/2024 1901 by Steve Lonell PARAS, RN Outcome: Progressing 08/01/2024 1901 by Steve Lonell PARAS, RN Outcome: Progressing   Problem: Nutrition: Goal: Adequate nutrition will be maintained 08/01/2024 1901 by Steve Lonell PARAS, RN Outcome: Progressing 08/01/2024 1901 by Steve Lonell PARAS, RN Outcome: Progressing   Problem: Coping: Goal: Level of anxiety will decrease 08/01/2024 1901 by Steve Lonell PARAS, RN Outcome: Progressing 08/01/2024  1901 by Steve Lonell PARAS, RN Outcome: Progressing   Problem: Elimination: Goal: Will not experience complications related to bowel motility 08/01/2024 1901 by Steve Lonell PARAS, RN Outcome: Progressing 08/01/2024 1901 by Steve Lonell PARAS, RN Outcome: Progressing Goal: Will not experience complications related to urinary retention 08/01/2024 1901 by Steve Lonell PARAS, RN Outcome: Progressing 08/01/2024 1901 by Steve Lonell PARAS, RN Outcome: Progressing   Problem: Safety: Goal: Ability to remain free from injury will improve 08/01/2024 1901 by Steve Lonell PARAS, RN Outcome: Progressing 08/01/2024 1901 by Steve Lonell PARAS, RN Outcome: Progressing   Problem: Skin Integrity: Goal: Risk for impaired skin integrity will decrease 08/01/2024 1901 by Steve Lonell PARAS, RN Outcome: Progressing 08/01/2024 1901 by Steve Lonell PARAS, RN Outcome: Progressing

## 2024-08-01 NOTE — Plan of Care (Signed)

## 2024-08-01 NOTE — Progress Notes (Signed)
 Progress Note   Patient: Maria Trujillo FMW:[id number] DOB: 05/15/1934 DOA: 07/30/2024     1 DOS: the patient was seen and examined on 08/01/2024   Brief hospital course: Partly taken from H&P.  Maria Trujillo is a 89 y.o. female with medical history significant for CAD with prior NSTEMI, HTN, HLD, persistent A-fib on Eliquis  and amiodarone , complicated by post conversion  pauses/bradycardia with syncope 04/2024 s/p pacemaker on 07/24/2024 -(Eliquis  held 9/26 to 9/30), being admitted for small bowel obstruction.  She presented with intractable nonbloody, nonbilious, non-coffee-ground vomiting starting on the night prior to arrival associated with lower abdominal pain.   On presentation stable vitals, labs with leukocytosis of 12.4 and UA with some ketones otherwise stable labs.  CT abdomen and pelvis showed SBO with suspected transition point in the right lower quadrant and mesenteric edema with free fluid.  NG tube was placed and general surgery was consulted.  10/6: Vital stable, improving leukocytosis now at 11.3, Significant drainage of about 1700 cc with NG tube. Continuing conservative management and general surgery might do Gastrografin challenge tomorrow if remained unchanged.  10/7: Vital stable, still no bowel movement or flatus, repeat KUB with persistently dilated loops of bowel with scattered air-fluid levels.  Continuing with conservative management, general surgery might try clamping NG tube today.  Assessment and Plan: * SBO (small bowel obstruction) (HCC) Still no BM or passing flatus.  Improving NG tube volume.  Repeat KUB with persistent SBO -Continue with NG tube and intermittent suctioning -Continue with conservative management -Surgery might do Gastrografin challenge tomorrow if remained unchanged  Coronary artery disease with history of NSTEMI Continue atorvastatin , Imdur  and lisinopril  when tolerating n.p.o.  Status cardiac pacemaker 07/24/2024 for sinus pause with  syncope History of sinus pauses on antiarrhythmics No acute issues suspected Patient denies chest pain, shortness of breath or lightheadedness Troponin negative.  Permanent atrial fibrillation (HCC) Chronic anticoagulation Will hold Eliquis  in case of need for surgical intervention Lovenox  DVT prophylaxis in the interim  HTN (hypertension) Hydralazine  as needed while NPO  Acquired hypothyroidism Holding home Synthroid  while she is n.p.o. Will resume once able to take by mouth   Subjective: Patient with still no bowel movement or passing flatus.  Still having mild lower abdominal pain.  Physical Exam: Vitals:   07/31/24 1509 07/31/24 2000 08/01/24 0336 08/01/24 1030  BP: (!) 142/58 (!) 146/62 132/64 126/62  Pulse: 61 60 61 60  Resp: (!) 21 16 16 18   Temp: 98.4 F (36.9 C) 98.5 F (36.9 C) 98.4 F (36.9 C) 98.1 F (36.7 C)  TempSrc: Oral     SpO2: 96% 93% 95% 94%  Weight:      Height:       General.  Frail elderly lady, in no acute distress. Pulmonary.  Lungs clear bilaterally, normal respiratory effort. CV.  Regular rate and rhythm, no JVD, rub or murmur. Abdomen.  Soft, mild lower abdominal tenderness, nondistended, BS positive. CNS.  Alert and oriented .  No focal neurologic deficit. Extremities.  No edema, no cyanosis, pulses intact and symmetrical.    Data Reviewed: Prior data reviewed  Family Communication: Talked with daughter on phone.  Disposition: Status is: Inpatient Remains inpatient appropriate because: Severity of illness  Planned Discharge Destination: Home  DVT prophylaxis.  Lovenox  Time spent: 50 minutes  This record has been created using Conservation officer, historic buildings. Errors have been sought and corrected,but may not always be located. Such creation errors do not reflect on the standard of  care.   Author: Amaryllis Dare, MD 08/01/2024 3:10 PM  For on call review [email or web address].

## 2024-08-02 ENCOUNTER — Inpatient Hospital Stay

## 2024-08-02 DIAGNOSIS — I4821 Permanent atrial fibrillation: Secondary | ICD-10-CM | POA: Diagnosis not present

## 2024-08-02 DIAGNOSIS — Z4682 Encounter for fitting and adjustment of non-vascular catheter: Secondary | ICD-10-CM | POA: Diagnosis not present

## 2024-08-02 DIAGNOSIS — R1030 Lower abdominal pain, unspecified: Secondary | ICD-10-CM | POA: Diagnosis not present

## 2024-08-02 DIAGNOSIS — K56609 Unspecified intestinal obstruction, unspecified as to partial versus complete obstruction: Secondary | ICD-10-CM | POA: Diagnosis not present

## 2024-08-02 DIAGNOSIS — Z7901 Long term (current) use of anticoagulants: Secondary | ICD-10-CM | POA: Diagnosis not present

## 2024-08-02 DIAGNOSIS — Z950 Presence of cardiac pacemaker: Secondary | ICD-10-CM | POA: Diagnosis not present

## 2024-08-02 MED ORDER — LEVOTHYROXINE SODIUM 100 MCG PO TABS: 200.0000 ug | ORAL_TABLET | Freq: Every day | ORAL | Status: DC

## 2024-08-02 MED ORDER — CHLORHEXIDINE GLUCONATE CLOTH 2 % EX PADS
6.0000 | MEDICATED_PAD | Freq: Every day | CUTANEOUS | Status: DC
  Administered 2024-08-03 – 2024-08-12 (×10): 6 via TOPICAL

## 2024-08-02 MED ORDER — IOHEXOL 300 MG/ML  SOLN
100.0000 mL | Freq: Once | INTRAMUSCULAR | Status: AC | PRN
  Administered 2024-08-02: 100 mL via INTRAVENOUS

## 2024-08-02 MED ORDER — AMIODARONE HCL 200 MG PO TABS
200.0000 mg | ORAL_TABLET | Freq: Every day | ORAL | Status: DC
  Administered 2024-08-02 – 2024-08-04 (×2): 200 mg via ORAL
  Filled 2024-08-02 (×2): qty 1

## 2024-08-02 MED ORDER — ACETAMINOPHEN 10 MG/ML IV SOLN
1000.0000 mg | Freq: Four times a day (QID) | INTRAVENOUS | Status: AC
Start: 2024-08-02 — End: 2024-08-04
  Administered 2024-08-02 – 2024-08-03 (×4): 1000 mg via INTRAVENOUS
  Filled 2024-08-02 (×4): qty 100

## 2024-08-02 MED ORDER — KCL IN DEXTROSE-NACL 20-5-0.45 MEQ/L-%-% IV SOLN
INTRAVENOUS | Status: AC
  Filled 2024-08-02 (×5): qty 1000

## 2024-08-02 MED ORDER — SODIUM CHLORIDE 0.9 % IV BOLUS
1000.0000 mL | Freq: Once | INTRAVENOUS | Status: AC
  Administered 2024-08-02: 1000 mL via INTRAVENOUS

## 2024-08-02 MED ORDER — METRONIDAZOLE 500 MG/100ML IV SOLN
500.0000 mg | Freq: Once | INTRAVENOUS | Status: AC
  Administered 2024-08-03: 500 mg via INTRAVENOUS
  Filled 2024-08-02 (×2): qty 100

## 2024-08-02 MED ORDER — PANTOPRAZOLE SODIUM 40 MG IV SOLR
40.0000 mg | INTRAVENOUS | Status: DC
  Administered 2024-08-02 – 2024-08-12 (×10): 40 mg via INTRAVENOUS
  Filled 2024-08-02 (×10): qty 10

## 2024-08-02 MED ORDER — LEVOTHYROXINE SODIUM 100 MCG PO TABS
100.0000 ug | ORAL_TABLET | Freq: Every day | ORAL | Status: DC
  Administered 2024-08-04 – 2024-08-12 (×8): 100 ug via ORAL
  Filled 2024-08-02 (×8): qty 1

## 2024-08-02 MED ORDER — IOHEXOL 9 MG/ML PO SOLN
500.0000 mL | ORAL | Status: AC
  Administered 2024-08-02 (×2): 500 mL via ORAL

## 2024-08-02 MED ORDER — METOPROLOL TARTRATE 5 MG/5ML IV SOLN
2.5000 mg | INTRAVENOUS | Status: AC | PRN
Start: 2024-08-02 — End: ?
  Administered 2024-08-03: 1 mg via INTRAVENOUS

## 2024-08-02 MED ORDER — CIPROFLOXACIN IN D5W 400 MG/200ML IV SOLN
400.0000 mg | Freq: Once | INTRAVENOUS | Status: AC
  Administered 2024-08-03: 400 mg via INTRAVENOUS
  Filled 2024-08-02: qty 200

## 2024-08-02 NOTE — Anesthesia Preprocedure Evaluation (Signed)
 Anesthesia Evaluation  Patient identified by MRN, date of birth, ID band Patient awake    Reviewed: Allergy & Precautions, H&P , NPO status , Patient's Chart, lab work & pertinent test results  History of Anesthesia Complications (+) PONV and history of anesthetic complications  Airway Mallampati: II  TM Distance: >3 FB Neck ROM: full    Dental no notable dental hx.    Pulmonary neg pulmonary ROS   Pulmonary exam normal        Cardiovascular hypertension, + CAD (h/o NSTEMI) and +CHF (Chronic diastolic heart failure)  + dysrhythmias (on Eliquis  and amiodarone ) Atrial Fibrillation + pacemaker (Dual Chamber implant 07/24/2024 for sinus pause with syncope. [employer] Accolade MRI EL DR, serial [id number])  Rhythm:Regular Rate:Tachycardia     Neuro/Psych negative neurological ROS  negative psych ROS   GI/Hepatic negative GI ROS, Neg liver ROS,,,  Endo/Other  negative endocrine ROS    Renal/GU      Musculoskeletal   Abdominal Normal abdominal exam  (+)  Abdomen: soft.   Peds  Hematology negative hematology ROS (+)   Anesthesia Other Findings SBO. NG decompression attempted over night with pt refusal. This morning pt denies nausea, abdomen is soft and nondistended, denies GERD.   Primary open angle glaucoma (POAG) of both eyes, severe stage  Past Medical History: No date: Arthritis No date: Atrial fibrillation (HCC) No date: History of kidney stones No date: Hypothyroidism No date: Myocardial infarct (HCC) No date: PONV (postoperative nausea and vomiting)  Past Surgical History: 1984: BREAST EXCISIONAL BIOPSY; Bilateral     Comment:  benign 03/04/2023: CARDIOVERSION; N/A     Comment:  Procedure: CARDIOVERSION;  Surgeon: Ammon Blunt, MD;  Location: [facility] ORS;  Service:               Cardiovascular;  Laterality: N/A; No date: CATARACT EXTRACTION, BILATERAL No date: CHOLECYSTECTOMY No  date: EYE SURGERY 12/2012: JOINT REPLACEMENT; Left     Comment:  knee 02/15/2017: KNEE ARTHROPLASTY; Right     Comment:  Procedure: COMPUTER ASSISTED TOTAL KNEE ARTHROPLASTY;                Surgeon: Lynwood SHAUNNA Hue, MD;  Location: [facility] ORS;                Service: Orthopedics;  Laterality: Right; 11/04/2020: LEFT HEART CATH AND CORONARY ANGIOGRAPHY; N/A     Comment:  Procedure: LEFT HEART CATH AND CORONARY ANGIOGRAPHY;                Surgeon: Ammon Blunt, MD;  Location: [facility]               INVASIVE CV LAB;  Service: Cardiovascular;  Laterality:               N/A; 07/24/2024: PACEMAKER IMPLANT; N/A     Comment:  Procedure: PACEMAKER IMPLANT;  Surgeon: Cindie Ole DASEN, MD;  Location: [facility] INVASIVE CV LAB;  Service:               Cardiovascular;  Laterality: N/A; 04/20/2018: PHOTOCOAGULATION WITH LASER; Right     Comment:  Procedure: TRANSCLERAL DIODE CYCLOPHOTOCOAGULATION                RIGHT per Hope block;  Surgeon: Mittie Gaskin, MD;  Location: [facility];  Service: Ophthalmology;                Laterality: Right; 2010: ROTATOR CUFF REPAIR; Left No date: TONSILLECTOMY  BMI    Body Mass Index: 28.15 kg/m      Reproductive/Obstetrics negative OB ROS                              Anesthesia Physical Anesthesia Plan  ASA: 3  Anesthesia Plan: General ETT   Post-op Pain Management: Ofirmev  IV (intra-op)* and Toradol IV (intra-op)*   Induction: Rapid sequence and Intravenous  PONV Risk Score and Plan: 2 and Ondansetron  and Dexamethasone   Airway Management Planned: Oral ETT  Additional Equipment:   Intra-op Plan:   Post-operative Plan: Extubation in OR  Informed Consent: I have reviewed the patients History and Physical, chart, labs and discussed the procedure including the risks, benefits and alternatives for the proposed anesthesia with the patient or authorized representative who has indicated  his/her understanding and acceptance.     Dental Advisory Given  Plan Discussed with: CRNA and Surgeon  Anesthesia Plan Comments:          Anesthesia Quick Evaluation

## 2024-08-02 NOTE — Assessment & Plan Note (Addendum)
 Chronic anticoagulation Patient developed A-fib with RVR during current hospitalization and was placed on amiodarone  infusion by cardiology 4 days.  - IV amiodarone  is being switched with p.o. today -Continue with metoprolol  -Eliquis  is being held for concern of melena requiring blood transfusion.  Surgery would like to keep holding for 2 weeks.  Cardiology will restart as outpatient.

## 2024-08-02 NOTE — Plan of Care (Signed)

## 2024-08-02 NOTE — Progress Notes (Signed)
 [facility] SURGICAL PROGRESS NOTE (cpt [phone])  Hospital Day(s): 2.   Interval History: Patient seen and examined, no acute events or new complaints overnight. Patient reports she is feeling well. She denied any significant abdominal pain, distension, nausea, emesis. She, and daughter, did note some reflux and has not been on home medications. No new labs this morning. KUB this morning with air in colon but there remain dilated loops of bowel. NPO this AM  Review of Systems:  Constitutional: denies fever, chills  HEENT: denies cough or congestion  Respiratory: denies any shortness of breath  Cardiovascular: denies chest pain or palpitations  Gastrointestinal: denies abdominal pain, N/V Genitourinary: denies burning with urination or urinary frequency Musculoskeletal: denies pain, decreased motor or sensation  Vital signs in last 24 hours: [min-max] current  Temp:  [98.1 F (36.7 C)-98.3 F (36.8 C)] 98.2 F (36.8 C) (10/08 0325) Pulse Rate:  [60-70] 61 (10/08 0325) Resp:  [16-18] 16 (10/08 0325) BP: (117-139)/(49-62) 117/62 (10/08 0325) SpO2:  [94 %-95 %] 94 % (10/08 0325)     Height: 5' 4 (162.6 cm) Weight: 74.4 kg BMI (Calculated): 28.14   Intake/Output last 2 shifts:  10/07 0701 - 10/08 0700 In: 20 [P.O.:20] Out: -    Physical Exam:  Constitutional: alert, cooperative and no distress  HENT: normocephalic without obvious abnormality Eyes: PERRL, EOM's grossly intact and symmetric  Respiratory: breathing non-labored at rest  Cardiovascular: regular rate and sinus rhythm  Gastrointestinal: soft, mild upper abdominal soreness improved from yesterday, distension improved, no rebound/guarding    Labs:     Latest Ref Rng & Units 08/01/2024    4:01 AM 07/31/2024    4:15 AM 07/30/2024    6:00 PM  CBC  WBC 4.0 - 10.5 K/uL 8.0  11.3  12.4   Hemoglobin 12.0 - 15.0 g/dL 87.7  87.6  87.3   Hematocrit 36.0 - 46.0 % 36.4  36.5  39.1   Platelets 150 - 400 K/uL 142   148  158       Latest Ref Rng & Units 08/01/2024    4:01 AM 07/31/2024    4:15 AM 07/30/2024    6:00 PM  CMP  Glucose 70 - 99 mg/dL 97  867  858   BUN 8 - 23 mg/dL 29  19  18    Creatinine 0.44 - 1.00 mg/dL 9.30  9.43  9.33   Sodium 135 - 145 mmol/L 139  139  137   Potassium 3.5 - 5.1 mmol/L 3.9  3.9  3.9   Chloride 98 - 111 mmol/L 108  109  103   CO2 22 - 32 mmol/L 25  22  21    Calcium  8.9 - 10.3 mg/dL 8.9  9.6  9.8   Total Protein 6.5 - 8.1 g/dL   7.1   Total Bilirubin 0.0 - 1.2 mg/dL   1.1   Alkaline Phos 38 - 126 U/L   89   AST 15 - 41 U/L   24   ALT 0 - 44 U/L   18      Imaging studies:   KUB (08/02/2024) personally reviewed with dilated loop of bowel in upper abdomen but there is frank gas in the right colon, and radiologist report pending...   Assessment/Plan: (ICD-10's: K78.609) 89 y.o. female with small bowel obstruction, complicated by pertinent comorbidities including advanced age, need for anticoagulation.                - I will trial CLD  with caution. She understands to take this slow and monitor closely for tolerance  - I do think she is sort of in a grey area given some dilation on her KUB and lack flatus but there is air in the right colon. If she fails to progress diet or has return of nausea/emesis, anticipate gastrografin challenge +/- replacement of NGT if needed. Patient and daughter are in agreement.   - We add IV PPI for GERD             - No need for emergent surgical intervention - Monitor abdominal examination; on-going bowel function               - Pain control prn; antiemetics prn             - Okay to mobilize; already ambulated on unit this AM             - Further management per primary service; we will follow   All of the above findings and recommendations were discussed with the patient, and the medical team, and all of patient's questions were answered to her expressed satisfaction.  -- Arthea Platt, PA-C [facility] 08/02/2024, 7:26 AM M-F: 7am - 4pm

## 2024-08-02 NOTE — Care Management Important Message (Signed)
 Important Message  Patient Details  Name: Maria Trujillo MRN: [id number] Date of Birth: [birth year]   Important Message Given:  Yes - Medicare IM     Rojelio SHAUNNA Rattler 08/02/2024, 4:15 PM

## 2024-08-02 NOTE — Progress Notes (Addendum)
 Progress Note   Patient: Maria Trujillo DOB: February 05, 1934 DOA: 07/30/2024     2 DOS: the patient was seen and examined on 08/02/2024   Brief hospital course: Partly taken from H&P.  Maria Trujillo is a 89 y.o. female with medical history significant for CAD with prior NSTEMI, HTN, HLD, persistent A-fib on Eliquis  and amiodarone , complicated by post conversion  pauses/bradycardia with syncope 04/2024 s/p pacemaker on 07/24/2024 -(Eliquis  held 9/26 to 9/30), being admitted for small bowel obstruction.  She presented with intractable nonbloody, nonbilious, non-coffee-ground vomiting starting on the night prior to arrival associated with lower abdominal pain.   On presentation stable vitals, labs with leukocytosis of 12.4 and UA with some ketones otherwise stable labs.  CT abdomen and pelvis showed SBO with suspected transition point in the right lower quadrant and mesenteric edema with free fluid.  NG tube was placed and general surgery was consulted.  10/6: Vital stable, improving leukocytosis now at 11.3, Significant drainage of about 1700 cc with NG tube. Continuing conservative management and general surgery might do Gastrografin challenge tomorrow if remained unchanged.  10/7: Vital stable, still no bowel movement or flatus, repeat KUB with persistently dilated loops of bowel with scattered air-fluid levels.  Continuing with conservative management, general surgery might try clamping NG tube today.  10/8: Hemodynamically stable, NG tube was removed and patient was started on clear liquid diet, still no BM or flatus, KUB with persistently dilated loops of bowel.  Surgery would like to give her a trial of some p.o. intake before doing Gastrografin challenge.  Surgery later decided to take her to the OR tomorrow morning.  Assessment and Plan: * SBO (small bowel obstruction) (HCC) Still no BM or passing flatus.  Repeat KUB with persistent SBO NG tube was removed and she was started on  clear liquid diet.  Surgery would like to give her some food before considering Gastrografin's challenge -Continue with conservative management - NG tube to be replaced if develop vomiting  Coronary artery disease with history of NSTEMI Continue atorvastatin , Imdur  and lisinopril  when tolerating n.p.o.  Status cardiac pacemaker 07/24/2024 for sinus pause with syncope History of sinus pauses on antiarrhythmics No acute issues suspected Patient denies chest pain, shortness of breath or lightheadedness Troponin negative.  Permanent atrial fibrillation (HCC) Chronic anticoagulation Will hold Eliquis  in case of need for surgical intervention Lovenox  DVT prophylaxis in the interim  HTN (hypertension) Hydralazine  as needed while NPO  Acquired hypothyroidism Holding home Synthroid  while she is n.p.o. Will resume once able to take by mouth   Subjective: Patient was having mild nausea but no vomiting.  Still no bowel movement are flatus, she was trying some clear liquid for but no appetite.  Physical Exam: Vitals:   08/01/24 1030 08/01/24 1630 08/01/24 2059 08/02/24 0325  BP: 126/62 139/62 (!) 135/49 117/62  Pulse: 60 61 70 61  Resp: 18 16 16 16   Temp: 98.1 F (36.7 C) 98.3 F (36.8 C) 98.3 F (36.8 C) 98.2 F (36.8 C)  TempSrc:  Oral Oral Oral  SpO2: 94% 95% 95% 94%  Weight:      Height:       General.  Frail elderly lady, in no acute distress. Pulmonary.  Lungs clear bilaterally, normal respiratory effort. CV.  Irregularly irregular Abdomen.  Soft, nontender, nondistended, BS hypoactive CNS.  Alert and oriented .  No focal neurologic deficit. Extremities.  No edema, no cyanosis, pulses intact and symmetrical.   Data Reviewed: Prior data reviewed  Family Communication: Discussed with daughter at bedside  Disposition: Status is: Inpatient Remains inpatient appropriate because: Severity of illness  Planned Discharge Destination: Home  DVT prophylaxis.  Lovenox  Time  spent: 50 minutes  This record has been created using Conservation officer, historic buildings. Errors have been sought and corrected,but may not always be located. Such creation errors do not reflect on the standard of care.   Author: Amaryllis Dare, MD 08/02/2024 4:21 PM  For on call review [email or web address].

## 2024-08-02 NOTE — Progress Notes (Addendum)
 Mobility Specialist Progress Note:    08/02/24 0928  Mobility  Activity Refused and notified nurse if applicable   Pt kindly refused mobility, asked this author to returned at a later time. All needs met.  Addendum: Attempted to see pt at 1345; refused d/t nausea.   Sherrilee Ditty Mobility Specialist Please contact via Special educational needs teacher or  Rehab office at [phone]

## 2024-08-03 ENCOUNTER — Inpatient Hospital Stay: Payer: Self-pay | Admitting: Anesthesiology

## 2024-08-03 ENCOUNTER — Encounter: Payer: Self-pay | Admitting: Internal Medicine

## 2024-08-03 ENCOUNTER — Ambulatory Visit

## 2024-08-03 ENCOUNTER — Other Ambulatory Visit: Payer: Self-pay

## 2024-08-03 ENCOUNTER — Encounter
Admission: EM | Disposition: A | Discharge status: Home / Self Care | Payer: Self-pay | Source: Home / Self Care | Attending: Internal Medicine

## 2024-08-03 DIAGNOSIS — I4821 Permanent atrial fibrillation: Secondary | ICD-10-CM | POA: Diagnosis not present

## 2024-08-03 DIAGNOSIS — Z7901 Long term (current) use of anticoagulants: Secondary | ICD-10-CM | POA: Diagnosis not present

## 2024-08-03 DIAGNOSIS — Z950 Presence of cardiac pacemaker: Secondary | ICD-10-CM | POA: Diagnosis not present

## 2024-08-03 DIAGNOSIS — K56609 Unspecified intestinal obstruction, unspecified as to partial versus complete obstruction: Secondary | ICD-10-CM | POA: Diagnosis not present

## 2024-08-03 HISTORY — PX: VENTRAL HERNIA REPAIR: SHX424

## 2024-08-03 HISTORY — PX: BOWEL RESECTION: SHX1257

## 2024-08-03 HISTORY — PX: LAPAROTOMY: SHX154

## 2024-08-03 LAB — BASIC METABOLIC PANEL WITH GFR
Anion gap: 7 (ref 5–15)
BUN: 33 mg/dL — ABNORMAL HIGH (ref 8–23)
CO2: 23 mmol/L (ref 22–32)
Calcium: 8.4 mg/dL — ABNORMAL LOW (ref 8.9–10.3)
Chloride: 102 mmol/L (ref 98–111)
Creatinine, Ser: 0.82 mg/dL (ref 0.44–1.00)
GFR, Estimated: >60 mL/min (ref >60)
Glucose, Bld: 113 mg/dL — ABNORMAL HIGH (ref 70–99)
Potassium: 3.6 mmol/L (ref 3.5–5.1)
Sodium: 135 mmol/L (ref 135–145)

## 2024-08-03 LAB — CBC
HCT: 38.9 % (ref 36.0–46.0)
Hemoglobin: 13 g/dL (ref 12.0–15.0)
MCH: 30.2 pg (ref 26.0–34.0)
MCHC: 33.4 g/dL (ref 30.0–36.0)
MCV: 90.5 fL (ref 80.0–100.0)
Platelets: 167 K/uL (ref 150–400)
RBC: 4.3 MIL/uL (ref 3.87–5.11)
RDW: 12.9 % (ref 11.5–15.5)
WBC: 8.7 K/uL (ref 4.0–10.5)
nRBC: 0 % (ref 0.0–0.2)

## 2024-08-03 LAB — MAGNESIUM: Magnesium: 2.1 mg/dL (ref 1.7–2.4)

## 2024-08-03 LAB — GLUCOSE, CAPILLARY: Glucose-Capillary: 124 mg/dL — ABNORMAL HIGH (ref 70–99)

## 2024-08-03 LAB — TYPE AND SCREEN
ABO/RH(D): O POS
Antibody Screen: NEGATIVE

## 2024-08-03 LAB — PHOSPHORUS: Phosphorus: 2.8 mg/dL (ref 2.5–4.6)

## 2024-08-03 SURGERY — LAPAROTOMY, EXPLORATORY
Anesthesia: General | Site: Abdomen

## 2024-08-03 MED ORDER — PROPOFOL 10 MG/ML IV BOLUS
INTRAVENOUS | Status: DC | PRN
  Administered 2024-08-03: 80 ug/kg/min via INTRAVENOUS
  Administered 2024-08-03: 100 ug/kg/min via INTRAVENOUS

## 2024-08-03 MED ORDER — DROPERIDOL 2.5 MG/ML IJ SOLN
0.6250 mg | Freq: Once | INTRAMUSCULAR | Status: DC | PRN
Start: 2024-08-03 — End: 2024-08-03

## 2024-08-03 MED ORDER — HYDROMORPHONE HCL 1 MG/ML IJ SOLN: 0.2500 mg | INTRAMUSCULAR | Status: DC | PRN

## 2024-08-03 MED ORDER — FENTANYL CITRATE (PF) 100 MCG/2ML IJ SOLN
INTRAMUSCULAR | Status: AC
  Filled 2024-08-03: qty 2

## 2024-08-03 MED ORDER — ROCURONIUM BROMIDE 100 MG/10ML IV SOLN
INTRAVENOUS | Status: DC | PRN
  Administered 2024-08-03: 30 mg via INTRAVENOUS

## 2024-08-03 MED ORDER — PROPOFOL 1000 MG/100ML IV EMUL
INTRAVENOUS | Status: AC
  Filled 2024-08-03: qty 200

## 2024-08-03 MED ORDER — 0.9 % SODIUM CHLORIDE (POUR BTL) OPTIME
TOPICAL | Status: DC | PRN
  Administered 2024-08-03: 2000 mL

## 2024-08-03 MED ORDER — INSULIN ASPART 100 UNIT/ML IJ SOLN
0.0000 [IU] | Freq: Four times a day (QID) | INTRAMUSCULAR | Status: DC
  Administered 2024-08-05 (×2): 1 [IU] via SUBCUTANEOUS
  Filled 2024-08-03 (×3): qty 1

## 2024-08-03 MED ORDER — TRAVASOL 10 % IV SOLN
INTRAVENOUS | Status: DC
  Filled 2024-08-03: qty 420

## 2024-08-03 MED ORDER — IRRISEPT - 450ML BOTTLE WITH 0.05% CHG IN STERILE WATER, USP 99.95% OPTIME
TOPICAL | Status: DC | PRN
  Administered 2024-08-03: 450 mL

## 2024-08-03 MED ORDER — DEXMEDETOMIDINE HCL IN NACL 80 MCG/20ML IV SOLN
INTRAVENOUS | Status: DC | PRN
Start: 2024-08-03 — End: 2024-08-03
  Administered 2024-08-03: 4 ug via INTRAVENOUS

## 2024-08-03 MED ORDER — LIDOCAINE HCL (CARDIAC) PF 100 MG/5ML IV SOSY
PREFILLED_SYRINGE | INTRAVENOUS | Status: DC | PRN
  Administered 2024-08-03: 100 mg via INTRAVENOUS

## 2024-08-03 MED ORDER — CALCIUM CHLORIDE 10 % IV SOLN
INTRAVENOUS | Status: AC
  Filled 2024-08-03: qty 10

## 2024-08-03 MED ORDER — ALBUMIN HUMAN 5 % IV SOLN: INTRAVENOUS | Status: DC | PRN

## 2024-08-03 MED ORDER — PHENYLEPHRINE HCL-NACL 20-0.9 MG/250ML-% IV SOLN
INTRAVENOUS | Status: AC
  Filled 2024-08-03: qty 250

## 2024-08-03 MED ORDER — ACETAMINOPHEN 10 MG/ML IV SOLN
1000.0000 mg | Freq: Four times a day (QID) | INTRAVENOUS | Status: AC
  Administered 2024-08-03 – 2024-08-04 (×4): 1000 mg via INTRAVENOUS
  Filled 2024-08-03 (×4): qty 100

## 2024-08-03 MED ORDER — METRONIDAZOLE 500 MG/100ML IV SOLN
500.0000 mg | Freq: Two times a day (BID) | INTRAVENOUS | Status: AC
  Administered 2024-08-03 – 2024-08-04 (×2): 500 mg via INTRAVENOUS
  Filled 2024-08-03 (×2): qty 100

## 2024-08-03 MED ORDER — SODIUM CHLORIDE (PF) 0.9 % IJ SOLN
INTRAMUSCULAR | Status: DC | PRN
  Administered 2024-08-03: 100 mL

## 2024-08-03 MED ORDER — EPHEDRINE SULFATE-NACL 50-0.9 MG/10ML-% IV SOSY
PREFILLED_SYRINGE | INTRAVENOUS | Status: DC | PRN
  Administered 2024-08-03: 10 mg via INTRAVENOUS

## 2024-08-03 MED ORDER — DIPHENHYDRAMINE HCL 50 MG/ML IJ SOLN: 25.0000 mg | Freq: Four times a day (QID) | INTRAMUSCULAR | Status: DC | PRN

## 2024-08-03 MED ORDER — DEXAMETHASONE SODIUM PHOSPHATE 10 MG/ML IJ SOLN
INTRAMUSCULAR | Status: DC | PRN
  Administered 2024-08-03: 10 mg via INTRAVENOUS

## 2024-08-03 MED ORDER — THIAMINE HCL 100 MG/ML IJ SOLN
100.0000 mg | Freq: Every day | INTRAMUSCULAR | Status: AC
Start: 2024-08-03 — End: 2024-08-09
  Administered 2024-08-03 – 2024-08-09 (×7): 100 mg via INTRAVENOUS
  Filled 2024-08-03 (×7): qty 2

## 2024-08-03 MED ORDER — PHENYLEPHRINE HCL (PRESSORS) 10 MG/ML IV SOLN
INTRAVENOUS | Status: DC | PRN
  Administered 2024-08-03: 160 ug via INTRAVENOUS

## 2024-08-03 MED ORDER — KETOROLAC TROMETHAMINE 15 MG/ML IJ SOLN
15.0000 mg | Freq: Four times a day (QID) | INTRAMUSCULAR | Status: DC | PRN
  Administered 2024-08-03 – 2024-08-07 (×8): 15 mg via INTRAVENOUS
  Filled 2024-08-03 (×9): qty 1

## 2024-08-03 MED ORDER — PHENYLEPHRINE 80 MCG/ML (10ML) SYRINGE FOR IV PUSH (FOR BLOOD PRESSURE SUPPORT)
PREFILLED_SYRINGE | INTRAVENOUS | Status: DC | PRN
  Administered 2024-08-03: 160 ug via INTRAVENOUS

## 2024-08-03 MED ORDER — SODIUM CHLORIDE (PF) 0.9 % IJ SOLN
INTRAMUSCULAR | Status: AC
  Filled 2024-08-03: qty 50

## 2024-08-03 MED ORDER — PROPOFOL 10 MG/ML IV BOLUS
INTRAVENOUS | Status: AC
Start: 2024-08-03 — End: 2024-08-03
  Filled 2024-08-03: qty 40

## 2024-08-03 MED ORDER — SUGAMMADEX SODIUM 200 MG/2ML IV SOLN
INTRAVENOUS | Status: DC | PRN
  Administered 2024-08-03: 100 mg via INTRAVENOUS

## 2024-08-03 MED ORDER — SEPRAFILM FOR OPTIME
ORAL_FILM | TOPICAL | Status: DC | PRN
  Administered 2024-08-03: 2 via TOPICAL

## 2024-08-03 MED ORDER — ACETAMINOPHEN 10 MG/ML IV SOLN
INTRAVENOUS | Status: AC
  Filled 2024-08-03: qty 100

## 2024-08-03 MED ORDER — ALBUMIN HUMAN 5 % IV SOLN
INTRAVENOUS | Status: AC
  Filled 2024-08-03: qty 500

## 2024-08-03 MED ORDER — CIPROFLOXACIN IN D5W 400 MG/200ML IV SOLN
400.0000 mg | Freq: Two times a day (BID) | INTRAVENOUS | Status: AC
Start: 2024-08-03 — End: 2024-08-04
  Administered 2024-08-03 – 2024-08-04 (×2): 400 mg via INTRAVENOUS
  Filled 2024-08-03 (×2): qty 200

## 2024-08-03 MED ORDER — FENTANYL CITRATE (PF) 100 MCG/2ML IJ SOLN
INTRAMUSCULAR | Status: DC | PRN
  Administered 2024-08-03: 100 ug via INTRAVENOUS
  Administered 2024-08-03 (×2): 50 ug via INTRAVENOUS

## 2024-08-03 MED ORDER — LIDOCAINE HCL URETHRAL/MUCOSAL 2 % EX GEL
CUTANEOUS | Status: DC | PRN
Start: 2024-08-03 — End: 2024-08-03
  Administered 2024-08-03: 1 via TOPICAL

## 2024-08-03 MED ORDER — CIPROFLOXACIN IN D5W 400 MG/200ML IV SOLN
INTRAVENOUS | Status: AC
  Filled 2024-08-03: qty 200

## 2024-08-03 MED ORDER — CALCIUM CHLORIDE 10 % IV SOLN
INTRAVENOUS | Status: DC | PRN
  Administered 2024-08-03: .5 g via INTRAVENOUS

## 2024-08-03 MED ORDER — SODIUM CHLORIDE (PF) 0.9 % IJ SOLN
INTRAMUSCULAR | Status: AC
  Filled 2024-08-03: qty 20

## 2024-08-03 MED ORDER — SODIUM CHLORIDE 0.9 % IV SOLN: INTRAVENOUS | Status: DC | PRN

## 2024-08-03 MED ORDER — BUPIVACAINE-EPINEPHRINE (PF) 0.25% -1:200000 IJ SOLN
INTRAMUSCULAR | Status: AC
  Filled 2024-08-03: qty 30

## 2024-08-03 MED ORDER — SUCCINYLCHOLINE CHLORIDE 200 MG/10ML IV SOSY
PREFILLED_SYRINGE | INTRAVENOUS | Status: DC | PRN
  Administered 2024-08-03: 100 mg via INTRAVENOUS

## 2024-08-03 MED ORDER — PHENYLEPHRINE HCL-NACL 20-0.9 MG/250ML-% IV SOLN
INTRAVENOUS | Status: DC | PRN
  Administered 2024-08-03: 30 ug/min via INTRAVENOUS
  Administered 2024-08-03: 50 ug/min via INTRAVENOUS

## 2024-08-03 MED ORDER — BUPIVACAINE LIPOSOME 1.3 % IJ SUSP
INTRAMUSCULAR | Status: AC
Start: 2024-08-03 — End: 2024-08-03
  Filled 2024-08-03: qty 20

## 2024-08-03 MED ORDER — ACETAMINOPHEN 10 MG/ML IV SOLN: 1000.0000 mg | Freq: Once | INTRAVENOUS | Status: DC | PRN

## 2024-08-03 SURGICAL SUPPLY — 38 items
BARRIER ADH SEPRAFILM 3INX5IN (MISCELLANEOUS) IMPLANT
BLADE CLIPPER SURG (BLADE) ×1 IMPLANT
CHLORAPREP W/TINT 26 (MISCELLANEOUS) ×1 IMPLANT
CLIP APPLIE 11 MED OPEN (CLIP) IMPLANT
CLIP APPLIE 13 LRG OPEN (CLIP) IMPLANT
DRAPE LAPAROTOMY 100X77 ABD (DRAPES) ×1 IMPLANT
DRAPE TABLE BACK 80X90 (DRAPES) IMPLANT
DRAPE WARM FLUID 44X44 (DRAPES) IMPLANT
DRSG OPSITE POSTOP 4X10 (GAUZE/BANDAGES/DRESSINGS) IMPLANT
DRSG OPSITE POSTOP 4X12 (GAUZE/BANDAGES/DRESSINGS) IMPLANT
ELECT BLADE 6.5 EXT (BLADE) ×1 IMPLANT
ELECTRODE REM PT RTRN 9FT ADLT (ELECTROSURGICAL) ×1 IMPLANT
GLOVE BIO SURGEON STRL SZ7 (GLOVE) ×2 IMPLANT
GOWN STRL REUS W/ TWL LRG LVL3 (GOWN DISPOSABLE) ×2 IMPLANT
LAVAGE JET IRRISEPT WOUND (IRRIGATION / IRRIGATOR) IMPLANT
LIGASURE IMPACT 36 18CM CVD LR (INSTRUMENTS) IMPLANT
MANIFOLD NEPTUNE II (INSTRUMENTS) ×1 IMPLANT
NDL HYPO 22X1.5 SAFETY MO (MISCELLANEOUS) ×2 IMPLANT
NEEDLE HYPO 22X1.5 SAFETY MO (MISCELLANEOUS) ×1 IMPLANT
NS IRRIG 1000ML POUR BTL (IV SOLUTION) IMPLANT
PACK BASIN MAJOR ARMC (MISCELLANEOUS) ×1 IMPLANT
RELOAD STAPLE 55 3.8 BLU REG (ENDOMECHANICALS) IMPLANT
RELOAD STAPLE 75 3.8 BLU REG (ENDOMECHANICALS) IMPLANT
SPONGE T-LAP 18X18 ~~LOC~~+RFID (SPONGE) ×1 IMPLANT
SPONGE T-LAP 18X36 ~~LOC~~+RFID STR (SPONGE) IMPLANT
STAPLER PROXIMATE 75MM BLUE (STAPLE) IMPLANT
STAPLER SKIN PROX 35W (STAPLE) ×1 IMPLANT
SUT PDS AB 0 CT1 27 (SUTURE) ×3 IMPLANT
SUT SILK 2 0 SH CR/8 (SUTURE) ×1 IMPLANT
SUT SILK 2 0SH CR/8 30 (SUTURE) ×1 IMPLANT
SUT SILK 2-0 18XBRD TIE 12 (SUTURE) ×1 IMPLANT
SUT VIC AB 0 CT1 36 (SUTURE) ×2 IMPLANT
SUT VIC AB 2-0 SH 27XBRD (SUTURE) IMPLANT
SUT VIC AB 3-0 SH 27X BRD (SUTURE) ×1 IMPLANT
SYR 20ML LL LF (SYRINGE) ×2 IMPLANT
SYR 3ML LL SCALE MARK (SYRINGE) ×1 IMPLANT
TRAP FLUID SMOKE EVACUATOR (MISCELLANEOUS) ×1 IMPLANT
TRAY FOLEY MTR SLVR 16FR STAT (SET/KITS/TRAYS/PACK) ×1 IMPLANT

## 2024-08-03 NOTE — Progress Notes (Signed)
 Initial Nutrition Assessment  DOCUMENTATION CODES:   Not applicable  INTERVENTION:   TPN per pharmacy- to start 10/10  Recommend thiamine 100mg  IV daily x 5-7 days   Pt at high refeed risk; recommend monitor potassium, magnesium  and phosphorus labs daily until stable  Daily weights   NUTRITION DIAGNOSIS:   Inadequate oral intake related to altered GI function as evidenced by NPO status.  GOAL:   Patient will meet greater than or equal to 90% of their needs  MONITOR:   Diet advancement, Labs, Weight trends, Skin, I & O's, Other (Comment) (TPN)  REASON FOR ASSESSMENT:   Consult New TPN/TNA  ASSESSMENT:   89 y/o female with h/o HTN, CAD, NSTEMI, MI, cholecystectomy, hypothyroidism, bradycardia/syncope s/p pacemaker (9/29), Afib s/p cardioversion (2024), GERD, HLD, RLS and kidney stones who is admitted with SBO now s/p exploratory laparotomy, reduction of internal hernia, small bowel resection (~15cm) with primary anastomosis and repair ventral hernia 10/9.  Met with patient and family in room today; pt's son and daughter are at bedside. Pt in good spirits today. Pt reports good appetite and oral intake at baseline but reports nausea and vomiting that started one day pta. Pt reports that she drinks chocolate Boost at home. Pt reports that at baseline, she is fairly active. Pt denies any recent weight changes; pt reports that her UBW is 161-164lbs. RD discussed with pt the importance of adequate nutrition needed to preserve lean muscle and support post op healing. Pt is agreeable to supplements with diet advancement. Pt has been on NPO/clear liquid diet since admission and is now without adequate nutrition for 4 days. Plan is for PICC line and TPN tomorrow; PICC line to be placed in IR secondary to recent pacemaker. Pt is at high refeed risk. Pt confirms documented fish allergy; will use intralipids instead of SMOF. Will need to monitor triglycerides more closely. Pt with h/o afib;  will monitor daily weights. Per chart, pt is weight stable since admission.   Medications reviewed and include: lovenox , insulin, synthroid , protonix , thiamine, ciprofloxacin, 5% dextrose /0.45% NaCl with KCl @100ml /hr, metronidazole  Labs reviewed: K 3.6 wnl, BUN 33(H), P 2.8 wnl, Mg 2.1 wnl   NUTRITION - FOCUSED PHYSICAL EXAM:  Flowsheet Row Most Recent Value  Orbital Region No depletion  Upper Arm Region No depletion  Thoracic and Lumbar Region Mild depletion  Buccal Region No depletion  Temple Region Moderate depletion  Clavicle Bone Region Moderate depletion  Clavicle and Acromion Bone Region Moderate depletion  Scapular Bone Region Mild depletion  Dorsal Hand No depletion  Patellar Region No depletion  Anterior Thigh Region No depletion  Posterior Calf Region No depletion  Edema (RD Assessment) None  Hair Reviewed  Eyes Reviewed  Mouth Reviewed  Skin Reviewed  Nails Reviewed   Diet Order:   Diet Order             Diet NPO time specified Except for: Ice Chips  Diet effective now                  EDUCATION NEEDS:   Education needs have been addressed  Skin:  Skin Assessment: Reviewed RN Assessment (incision abdomen)  Last BM:  10/5  Height:   Ht Readings from Last 1 Encounters:  07/30/24 5' 4 (1.626 m)    Weight:   Wt Readings from Last 1 Encounters:  08/03/24 74.3 kg    Ideal Body Weight:  54.5 kg  BMI:  Body mass index is 28.12 kg/m.  Estimated  Nutritional Needs:   Kcal:  1600-1800kcal/day  Protein:  80-90g/day  Fluid:  1.4-1.6L/day  Augustin Shams MS, RD, LDN If unable to be reached, please send secure chat to RD inpatient available from 8:00a-4:00p daily

## 2024-08-03 NOTE — Progress Notes (Signed)
 PHARMACY - TOTAL PARENTERAL NUTRITION CONSULT NOTE   Indication: Small bowel obstruction  Patient Measurements: Height: 5' 4 (162.6 cm) Weight: 74.4 kg (164 lb) IBW/kg (Calculated) : 54.7 TPN AdjBW (KG): 59.6 Body mass index is 28.15 kg/m. Usual Weight: UNK  Assessment: 89yo female admitted with intractable vomiting and small bowel obstruction. Patient has PMH CAD, NSTEMI, HTN, HLD, and A. Fib. Patient reports Hives allergy to Shellfish. Allergy was confirmed with family to be to ALL seafood.   Glucose / Insulin: BG 90-140s Electrolytes: WNL, but high risk for refeeding  Renal: Scr <1 Hepatic: LFTs WNL Intake / Output; MIVF: D5/1/2NS w/KCL @ 100 ml/hr - Stop time 10/9 @ 1714 GI Imaging:  10/8 CT/ABDOMEN PELVIS Complex small bowel obstruction with 2 points of obstruction  GI Surgeries / Procedures: S/P exploratory laparotomy, small bowel resection, reduction of internal hernia, and repair or ventral hernia on 10/9  Central access: PICC order Placed 10/9 AM TPN start date: 08/03/2024  Nutritional Goals: Goal TPN rate is 70 mL/hr (provides 84g of protein and 1696 kcals per day)  RD Assessment: Estimated Needs Total Energy Estimated Needs: 1600-1800kcal/day Total Protein Estimated Needs: 80-90g/day Total Fluid Estimated Needs: 1.4-1.6L/day  Current Nutrition:  NPO, D5/1/2NS w/KCL 20mEq @ 100 ml/hr  Plan:  Start TPN at 35mL/hr (1/2 rate) at 1800 Electrolytes in TPN: Na 50mEq/L, K 50mEq/L, Ca 7mEq/L, Mg 50mEq/L, and Phos 15mmol/L. Cl:Ac 1:1 Due to allergy to ALL Seafoods- INTRALIPIDS used instead of SMOFLIPIDS Add standard MVI and trace elements to TPN Thiamine 100mg  IV x 7 days ordered (Day1)  Initiate Sensitive q6h SSI and adjust as needed  Monitor TPN labs on Mon/Thurs. BMP, Phos, and Mag ordered tomorrow.   Estill CHRISTELLA Lutes, PharmD, BCPS Clinical Pharmacist 08/03/2024 10:10 AM

## 2024-08-03 NOTE — Assessment & Plan Note (Addendum)
 Blood pressure currently borderline soft. - Holding home meds -Continue metoprolol 

## 2024-08-03 NOTE — Assessment & Plan Note (Addendum)
 Blood pressure currently soft Keep holding home antihypertensives.

## 2024-08-03 NOTE — Anesthesia Postprocedure Evaluation (Signed)
 Anesthesia Post Note  Patient: Maria Trujillo  Procedure(s) Performed: LAPAROTOMY, EXPLORATORY (Abdomen) EXCISION, SMALL INTESTINE (Abdomen) REPAIR, HERNIA, VENTRAL (Abdomen)  Patient location during evaluation: PACU Anesthesia Type: General Level of consciousness: awake and alert Pain management: pain level controlled Vital Signs Assessment: post-procedure vital signs reviewed and stable Respiratory status: spontaneous breathing, nonlabored ventilation, respiratory function stable and patient connected to nasal cannula oxygen Cardiovascular status: blood pressure returned to baseline, stable and tachycardic Postop Assessment: no apparent nausea or vomiting Anesthetic complications: no   There were no known notable events for this encounter.   Last Vitals:  Vitals:   08/03/24 1042 08/03/24 1115  BP: (!) 99/56 102/65  Pulse: 89   Resp: 18 16  Temp: 36.5 C 36.7 C  SpO2: 95% 94%    Last Pain:  Vitals:   08/03/24 1115  TempSrc: Oral  PainSc:                  Camellia Merilee Louder

## 2024-08-03 NOTE — Anesthesia Procedure Notes (Signed)
 Procedure Name: Intubation Date/Time: 08/03/2024 8:14 AM  Performed by: Norleen Alberta HERO., CRNAPre-anesthesia Checklist: Patient identified, Patient being monitored, Timeout performed, Emergency Drugs available and Suction available Patient Re-evaluated:Patient Re-evaluated prior to induction Oxygen Delivery Method: Circle system utilized Preoxygenation: Pre-oxygenation with 100% oxygen Induction Type: IV induction Ventilation: Mask ventilation without difficulty Laryngoscope Size: 3 and McGrath Grade View: Grade I Tube type: Oral Tube size: 6.5 mm Number of attempts: 1 Airway Equipment and Method: Stylet Placement Confirmation: ETT inserted through vocal cords under direct vision, positive ETCO2 and breath sounds checked- equal and bilateral Secured at: 19 cm Tube secured with: Tape Dental Injury: Teeth and Oropharynx as per pre-operative assessment

## 2024-08-03 NOTE — Progress Notes (Signed)
 Progress Note   Patient: Maria Trujillo FMW:[id number] DOB: [birth year] DOA: 07/30/2024     3 DOS: the patient was seen and examined on 08/03/2024   Brief hospital course: Partly taken from H&P.  Maria Trujillo is a 89 y.o. female with medical history significant for CAD with prior NSTEMI, HTN, HLD, persistent A-fib on Eliquis  and amiodarone , complicated by post conversion  pauses/bradycardia with syncope 04/2024 s/p pacemaker on 07/24/2024 -(Eliquis  held 9/26 to 9/30), being admitted for small bowel obstruction.  She presented with intractable nonbloody, nonbilious, non-coffee-ground vomiting starting on the night prior to arrival associated with lower abdominal pain.   On presentation stable vitals, labs with leukocytosis of 12.4 and UA with some ketones otherwise stable labs.  CT abdomen and pelvis showed SBO with suspected transition point in the right lower quadrant and mesenteric edema with free fluid.  NG tube was placed and general surgery was consulted.  10/6: Vital stable, improving leukocytosis now at 11.3, Significant drainage of about 1700 cc with NG tube. Continuing conservative management and general surgery might do Gastrografin challenge tomorrow if remained unchanged.  10/7: Vital stable, still no bowel movement or flatus, repeat KUB with persistently dilated loops of bowel with scattered air-fluid levels.  Continuing with conservative management, general surgery might try clamping NG tube today.  10/8: Hemodynamically stable, NG tube was removed and patient was started on clear liquid diet, still no BM or flatus, KUB with persistently dilated loops of bowel.  Surgery would like to give her a trial of some p.o. intake before doing Gastrografin challenge.  Surgery later decided to take her to the OR tomorrow morning, failed Gastrografin challenge.  10/9: Vital stable, s/p exploratory laparotomy with reduction of internal hernia and resection of small bowel with primary anastomosis  and adhesion lysis. General surgery wants her to be started on TPN as expecting prolonged ileus, patient recently had a pacemaker in place and cardiology is little hesitant to use a central line.  IR was consulted to avoid pacemaker leads.  Assessment and Plan: * SBO (small bowel obstruction) (HCC) S/p laparotomy with some bowel resection and adhesion of lysis along with internal hernia reduction today. -Continue with NG tube for now - Surgery would like to start her on TPN as expecting prolonged ileus. - IR was consulted as patient has a recent pacemaker placed and simple PICC line can dislodge any lead. -Continue with supportive care  Coronary artery disease with history of NSTEMI Continue atorvastatin , Imdur  and lisinopril  when tolerating n.p.o.  Status cardiac pacemaker 07/24/2024 for sinus pause with syncope History of sinus pauses on antiarrhythmics No acute issues suspected Patient denies chest pain, shortness of breath or lightheadedness Troponin negative. - Patient will need a central line for TPN which will increase the risk of infection or lead disruption.  Permanent atrial fibrillation (HCC) Chronic anticoagulation Will hold Eliquis  in case of need for surgical intervention Lovenox  DVT prophylaxis in the interim  HTN (hypertension) Hydralazine  as needed while NPO  Acquired hypothyroidism Holding home Synthroid  while she is n.p.o. Will resume once able to take by mouth   Subjective: Patient was seen after the laparotomy, she was somnolent but arousable after anesthesia, denies any significant pain.  Family at bedside.  Physical Exam: Vitals:   08/03/24 1000 08/03/24 1015 08/03/24 1042 08/03/24 1115  BP: (!) 102/58 97/67 (!) 99/56 102/65  Pulse: (!) 113 100 89   Resp: (!) 21 19 18 16   Temp:  98.8 F (37.1 C) 97.7 F (36.5  C) 98 F (36.7 C)  TempSrc:    Oral  SpO2: 93% 92% 95% 94%  Weight:      Height:       General.  Frail and malnourished elderly lady, in  no acute distress. Pulmonary.  Lungs clear bilaterally, normal respiratory effort. CV.  Regular rate and rhythm, no JVD, rub or murmur. Abdomen.  Soft, soft, mild diffuse tenderness, hypoactive bowel sounds CNS.  Somnolent.  No focal neurologic deficit. Extremities.  No edema,  pulses intact and symmetrical.   Data Reviewed: Prior data reviewed  Family Communication: Discussed with daughter at bedside  Disposition: Status is: Inpatient Remains inpatient appropriate because: Severity of illness  Planned Discharge Destination: Home  DVT prophylaxis.  Lovenox  Time spent: 50 minutes  This record has been created using Conservation officer, historic buildings. Errors have been sought and corrected,but may not always be located. Such creation errors do not reflect on the standard of care.   Author: Amaryllis Dare, MD 08/03/2024 1:59 PM  For on call review [email or web address].

## 2024-08-03 NOTE — Transfer of Care (Signed)
 Immediate Anesthesia Transfer of Care Note  Patient: Maria Trujillo  Procedure(s) Performed: LAPAROTOMY, EXPLORATORY (Abdomen)  Patient Location: PACU  Anesthesia Type:General  Level of Consciousness: drowsy and patient cooperative  Airway & Oxygen Therapy: Patient connected to face mask oxygen  Post-op Assessment: Post -op Vital signs reviewed and stable  Post vital signs: stable  Last Vitals:  Vitals Value Taken Time  BP 117/59 08/03/24 09:30  Temp    Pulse 86 08/03/24 09:31  Resp 24 08/03/24 09:31  SpO2 95 % 08/03/24 09:31  Vitals shown include unfiled device data.  Last Pain:  Vitals:   08/03/24 0704  TempSrc: Temporal  PainSc: 0-No pain         Complications: No notable events documented.

## 2024-08-03 NOTE — Assessment & Plan Note (Addendum)
 History of sinus pauses on antiarrhythmics No acute issues suspected Patient denies chest pain, shortness of breath or lightheadedness Troponin negative. Central line was placed by IR for concern of recent pacemaker leads

## 2024-08-03 NOTE — Progress Notes (Incomplete)
 Orders placed per Dr. Jordis to place NG tube. With help of second RN placed first NG and ordered KUB but NG not shown on imaging. Dr. Lane ordered to remove and place a second NG tube. During attempt for second NG tube, difficult to get past her nose and then immediately patient started vomiting and made tube curl back up into her mouth. She does not want another attempt made. Notified Dr. Lane

## 2024-08-03 NOTE — Progress Notes (Signed)
 Preoperative Review   Patient is met in the preoperative holding area. The history is reviewed in the chart and with the patient. I personally reviewed the options and rationale as well as the risks of this procedure that have been previously discussed with the patient. All questions asked by the patient and/or family were answered to their satisfaction. Specifically I talked to her and daughter regarding her disease process, even though she does have multiple risks factors at this point we have little choice but to proceed. They understand that this may be an extensive surgery w significant potential risk ( bleeding, infection, resp failure, prolonged hospitalization, need for ostomy) We attempted to do NG x 2 and pt was exhausted. She di vomit twice  Patient agrees to proceed with this procedure at this time.  Laneta Luna M.D. FACS

## 2024-08-03 NOTE — Op Note (Signed)
 PROCEDURES: 1. Exploratory Laparotomy 2. Reduction of internal hernia 3. Small bowel resection with primary anastomosis 4. Repair ventral hernia  Pre-operative Diagnosis: SBO  Post-operative Diagnosis: same  Surgeon: Laneta FALCON Harlowe Dowler   Assistants: Shelba Rakers RNFA  Anesthesia: General endotracheal anesthesia  ASA Class: 3  Surgeon: Laneta Luna , MD FACS  Anesthesia: Gen. with endotracheal tube  Findings: Internal Hernia causing SBO 2 cm ventral hernia Contused and ischemic small bowel in need for resection No evidence of perforation Serosanguinous ascites  Estimated Blood Loss: 20cc              Specimens: small bowel        Complications: none         Condition: stable  Procedure Details  The patient was seen again in the Holding Room. The benefits, complications, treatment options, and expected outcomes were discussed with the patient. The risks of bleeding, infection, recurrence of symptoms, failure to resolve symptoms,  bowel injury, any of which could require further surgery were reviewed with the patient.   The patient was taken to Operating Room, identified as Maria Trujillo and the procedure verified.  A Time Out was held and the above information confirmed.  Prior to the induction of general anesthesia, antibiotic prophylaxis was administered. VTE prophylaxis was in place. General endotracheal anesthesia was then administered and tolerated well. After the induction, the abdomen was prepped with Chloraprep and draped in the sterile fashion. The patient was positioned in the supine position.  Incision was created as a midline laparotomy. The abdominal cavity was entered under direct visualization after incising the peritoneum and fascia sharply between [employer]. There was serosanguinous ascites, small bowel was dilated but not perforated, we found an adhesion causing an internal hernia , we cut the adhesion with metz and the internal hernia reduced a 15 cm of bowel was  contusion along with its mesentery with some ischemic changes from mechanical obstruction it was not necrotic but I was very concerned about viability. I decided to perform a bowel resection by identifying good proximal and distal margins and mesenteric windows created with cautery. 75 mm GIA blue load stapler used to divede bowel proximally and distally. A side to side functional end to end stapler anastomosis created with a 75 mm blue load stapler and common channel closed in a two layer fashion with multiple 2-0 vicryl for inner layer and 2-0 silks for lemrberts. Anastomosis was checked and was found w great perfusion, widely patent and air and water tight. Mesenteric defect closed w a 3-0 vicryl. Abdominal cavity irrigated with irricept and seprafilm placed to prevent adhesions.  A second look showed no evidence of any bleeding or any other injuries. We changed gloves and place a new tray to close the   abdomen with a -0 PDS suture in a running fashion incorporating prior hernia defect and the skin was closed with staples. Liposomal Marcaine   was injected to perform bilateral TAP blocks under direct visualization.Sterile dressing applied. Needle and laparotomy count were correct and there were no immediate occasions  Laneta Luna, MD, FACS

## 2024-08-04 ENCOUNTER — Inpatient Hospital Stay: Admitting: Radiology

## 2024-08-04 ENCOUNTER — Encounter: Payer: Self-pay | Admitting: Surgery

## 2024-08-04 DIAGNOSIS — I4821 Permanent atrial fibrillation: Secondary | ICD-10-CM | POA: Diagnosis not present

## 2024-08-04 DIAGNOSIS — Z950 Presence of cardiac pacemaker: Secondary | ICD-10-CM | POA: Diagnosis not present

## 2024-08-04 DIAGNOSIS — Z7901 Long term (current) use of anticoagulants: Secondary | ICD-10-CM | POA: Diagnosis not present

## 2024-08-04 DIAGNOSIS — K56609 Unspecified intestinal obstruction, unspecified as to partial versus complete obstruction: Secondary | ICD-10-CM | POA: Diagnosis not present

## 2024-08-04 LAB — HEMOGLOBIN A1C
Hgb A1c MFr Bld: 5.1 % (ref 4.8–5.6)
Mean Plasma Glucose: 99.67 mg/dL

## 2024-08-04 LAB — BASIC METABOLIC PANEL WITH GFR
Anion gap: 7 (ref 5–15)
BUN: 32 mg/dL — ABNORMAL HIGH (ref 8–23)
CO2: 22 mmol/L (ref 22–32)
Calcium: 8.7 mg/dL — ABNORMAL LOW (ref 8.9–10.3)
Chloride: 103 mmol/L (ref 98–111)
Creatinine, Ser: 0.83 mg/dL (ref 0.44–1.00)
GFR, Estimated: >60 mL/min (ref >60)
Glucose, Bld: 112 mg/dL — ABNORMAL HIGH (ref 70–99)
Potassium: 3.8 mmol/L (ref 3.5–5.1)
Sodium: 132 mmol/L — ABNORMAL LOW (ref 135–145)

## 2024-08-04 LAB — GLUCOSE, CAPILLARY
Glucose-Capillary: 110 mg/dL — ABNORMAL HIGH (ref 70–99)
Glucose-Capillary: 117 mg/dL — ABNORMAL HIGH (ref 70–99)
Glucose-Capillary: 117 mg/dL — ABNORMAL HIGH (ref 70–99)
Glucose-Capillary: 97 mg/dL (ref 70–99)

## 2024-08-04 LAB — PHOSPHORUS: Phosphorus: 3 mg/dL (ref 2.5–4.6)

## 2024-08-04 LAB — APTT: aPTT: 40 s — ABNORMAL HIGH (ref 24–36)

## 2024-08-04 LAB — SURGICAL PATHOLOGY

## 2024-08-04 LAB — MAGNESIUM: Magnesium: 1.8 mg/dL (ref 1.7–2.4)

## 2024-08-04 LAB — HEPARIN LEVEL (UNFRACTIONATED): Heparin Unfractionated: <0.1 [IU]/mL — ABNORMAL LOW (ref 0.30–0.70)

## 2024-08-04 LAB — PROTIME-INR
INR: 1.4 — ABNORMAL HIGH (ref 0.8–1.2)
Prothrombin Time: 17.8 s — ABNORMAL HIGH (ref 11.4–15.2)

## 2024-08-04 MED ORDER — MAGNESIUM SULFATE 2 GM/50ML IV SOLN
2.0000 g | Freq: Once | INTRAVENOUS | Status: AC
  Administered 2024-08-04: 2 g via INTRAVENOUS
  Filled 2024-08-04: qty 50

## 2024-08-04 MED ORDER — TRAVASOL 10 % IV SOLN
INTRAVENOUS | Status: AC
  Filled 2024-08-04: qty 420

## 2024-08-04 MED ORDER — AMIODARONE LOAD VIA INFUSION
150.0000 mg | Freq: Once | INTRAVENOUS | Status: AC
  Administered 2024-08-04: 150 mg via INTRAVENOUS
  Filled 2024-08-04: qty 83.34

## 2024-08-04 MED ORDER — LIDOCAINE HCL 1 % IJ SOLN
INTRAMUSCULAR | Status: AC
  Filled 2024-08-04: qty 20

## 2024-08-04 MED ORDER — HEPARIN (PORCINE) 25000 UT/250ML-% IV SOLN
1000.0000 [IU]/h | INTRAVENOUS | Status: DC
  Administered 2024-08-04: 1000 [IU]/h via INTRAVENOUS
  Filled 2024-08-04: qty 250

## 2024-08-04 MED ORDER — AMIODARONE HCL IN DEXTROSE 360-4.14 MG/200ML-% IV SOLN
30.0000 mg/h | INTRAVENOUS | Status: AC
  Administered 2024-08-04 – 2024-08-10 (×12): 30 mg/h via INTRAVENOUS
  Filled 2024-08-04 (×13): qty 200

## 2024-08-04 MED ORDER — HEPARIN (PORCINE) 25000 UT/250ML-% IV SOLN
1000.0000 [IU]/h | INTRAVENOUS | Status: DC
  Filled 2024-08-04: qty 250

## 2024-08-04 MED ORDER — LIDOCAINE HCL 1 % IJ SOLN
2.0000 mL | Freq: Once | INTRAMUSCULAR | Status: AC
Start: 2024-08-04 — End: 2024-08-04
  Administered 2024-08-04: 2 mL via INTRADERMAL

## 2024-08-04 MED ORDER — AMIODARONE HCL IN DEXTROSE 360-4.14 MG/200ML-% IV SOLN
60.0000 mg/h | INTRAVENOUS | Status: AC
  Administered 2024-08-04: 60 mg/h via INTRAVENOUS
  Filled 2024-08-04: qty 200

## 2024-08-04 NOTE — Evaluation (Signed)
 Occupational Therapy Evaluation Patient Details Name: Maria Trujillo MRN: [id number] DOB: [birth year] Today's Date: 08/04/2024   History of Present Illness   89 y.o. female with medical history significant for CAD with prior NSTEMI, HTN, HLD, persistent A-fib on Eliquis  and amiodarone , complicated by post conversion  pauses/bradycardia with syncope 04/2024 s/p pacemaker on 07/24/2024 -(Eliquis  held 9/26 to 9/30), being admitted for small bowel obstruction.     Clinical Impressions Patient presenting with decreased Ind in self care,balance, functional mobility/transfers, endurance, and safety awareness. Patient reports being Mod I at baseline with use of SPC in community, driving, and living at home alone and independently. Pt needing mod A for bed mobility. Stand from bed with mod A and stand pivot transfer to [facility] with mod A. Pt able to void while seated on commode and needing assistance to stand and balance while performing hygiene. Pt stands and pivots back to bed with mod A. She sits on EOB with daughter remaining  in room. Pt is far from baseline. Patient will benefit from acute OT to increase overall independence in the areas of ADLs, functional mobility, and safety awareness in order to safely discharge.     If plan is discharge home, recommend the following:   A lot of help with walking and/or transfers;A lot of help with bathing/dressing/bathroom;Assistance with cooking/housework;Assist for transportation;Help with stairs or ramp for entrance     Functional Status Assessment   Patient has had a recent decline in their functional status and demonstrates the ability to make significant improvements in function in a reasonable and predictable amount of time.     Equipment Recommendations   Other (comment) (defer to next venue of care)      Precautions/Restrictions   Precautions Precautions: Fall;ICD/Pacemaker Precaution/Restrictions Comments: NG tube Restrictions Weight  Bearing Restrictions Per Provider Order: Yes LUE Weight Bearing Per Provider Order: Non weight bearing Other Position/Activity Restrictions: L UE pacemaker precautions -limit pushing and pulling     Mobility Bed Mobility Overal bed mobility: Needs Assistance Bed Mobility: Supine to Sit     Supine to sit: Mod assist          Transfers Overall transfer level: Needs assistance Equipment used: None, Straight cane Transfers: Sit to/from Stand, Bed to chair/wheelchair/BSC Sit to Stand: Min assist     Step pivot transfers: Min assist, Mod assist            Balance Overall balance assessment: Needs assistance Sitting-balance support: Feet supported Sitting balance-Leahy Scale: Good     Standing balance support: Single extremity supported, No upper extremity supported Standing balance-Leahy Scale: Poor                             ADL either performed or assessed with clinical judgement   ADL Overall ADL's : Needs assistance/impaired                         Toilet Transfer: Moderate assistance;BSC/3in1;Stand-pivot   Toileting- Clothing Manipulation and Hygiene: Moderate assistance;Sit to/from stand               Vision Baseline Vision/History: 1 Wears glasses Patient Visual Report: No change from baseline              Pertinent Vitals/Pain Pain Assessment Pain Assessment: Faces Faces Pain Scale: Hurts little more Pain Location: abdomen Pain Descriptors / Indicators: Aching, Discomfort Pain Intervention(s): Monitored during session, Repositioned, Limited activity within patient's  tolerance     Extremity/Trunk Assessment Upper Extremity Assessment Upper Extremity Assessment: Generalized weakness   Lower Extremity Assessment Lower Extremity Assessment: Generalized weakness       Communication Communication Communication: No apparent difficulties   Cognition Arousal: Alert Behavior During Therapy: WFL for tasks  assessed/performed Cognition: No apparent impairments                               Following commands: Intact       Cueing  General Comments   Cueing Techniques: Verbal cues              Home Living Family/patient expects to be discharged to:: Private residence Living Arrangements: Alone Available Help at Discharge: Family;Available 24 hours/day Type of Home: House Home Access: Ramped entrance     Home Layout: One level     Bathroom Shower/Tub: Estate manager/land agent Accessibility: Yes   Home Equipment: Shower seat - built in;Cane - single point;BSC/3in1          Prior Functioning/Environment Prior Level of Function : Independent/Modified Independent;Driving             Mobility Comments: indep, ambulates without AD in home, uses [facility] for longer distances in community setting. no falls hx ADLs Comments: indep    OT Problem List: Decreased strength;Impaired balance (sitting and/or standing);Decreased cognition;Decreased safety awareness;Decreased activity tolerance   OT Treatment/Interventions: Self-care/ADL training;Therapeutic exercise;Patient/family education;Balance training;Energy conservation;Therapeutic activities;Cognitive remediation/compensation      OT Goals(Current goals can be found in the care plan section)   Acute Rehab OT Goals Patient Stated Goal: to go home OT Goal Formulation: With patient/family Time For Goal Achievement: 08/18/24 Potential to Achieve Goals: Fair ADL Goals Pt Will Perform Upper Body Bathing: with supervision;sitting Pt Will Perform Lower Body Dressing: with supervision;sit to/from stand Pt Will Transfer to Toilet: with supervision;ambulating Pt Will Perform Toileting - Clothing Manipulation and hygiene: with supervision;sit to/from stand   OT Frequency:  Min 2X/week       AM-PAC OT 6 Clicks Daily Activity     Outcome Measure Help from another person eating meals?: None Help from  another person taking care of personal grooming?: A Little Help from another person toileting, which includes using toliet, bedpan, or urinal?: A Lot Help from another person bathing (including washing, rinsing, drying)?: A Lot Help from another person to put on and taking off regular upper body clothing?: A Little Help from another person to put on and taking off regular lower body clothing?: A Lot 6 Click Score: 16   End of Session Nurse Communication: Mobility status  Activity Tolerance: Patient limited by fatigue Patient left: in bed;with call bell/phone within reach;with family/visitor present  OT Visit Diagnosis: Unsteadiness on feet (R26.81);Repeated falls (R29.6);Muscle weakness (generalized) (M62.81)                Time: 8859-8790 OT Time Calculation (min): 29 min Charges:  OT General Charges $OT Visit: 1 Visit OT Evaluation $OT Eval Moderate Complexity: 1 Mod OT Treatments $Self Care/Home Management : 8-22 mins  Izetta Claude, MS, OTR/L , CBIS ascom [phone]  08/04/24, 1:03 PM

## 2024-08-04 NOTE — Progress Notes (Signed)
 PHARMACY - ANTICOAGULATION CONSULT NOTE  Pharmacy Consult for Heparin  infusion  Indication: atrial fibrillation  Allergies  Allergen Reactions   Celecoxib Hives   Dorzolamide Shortness Of Breath    Other Reaction(s): Other (See Comments)  Pt stated had reactions but unable to provide information   Penicillins Other (See Comments), Hives and Rash    Redness (sunburn type rash with peeling)  Has patient had a PCN reaction causing immediate rash, facial/tongue/throat swelling, SOB or lightheadedness with hypotension: No  Has patient had a PCN reaction causing severe rash involving mucus membranes or skin necrosis: No  Has patient had a PCN reaction that required hospitalization No  Has patient had a PCN reaction occurring within the last 10 years: Yes  If all of the above answers are NO, then may proceed with Cephalosporin use.  Other Reaction(s): Other (See Comments)  Redness (sunburn type rash with peeling)  Has patient had a PCN reaction causing immediate rash, facial/tongue/throat swelling, SOB or lightheadedness with hypotension: No  Has patient had a PCN reaction causing severe rash involving mucus membranes or skin necrosis: No  Has patient had a PCN reaction that required hospitalization No  Has patient had a PCN reaction occurring within the last 10 years: Yes  If all of the above answers are NO, then may proceed with Cephalosporin use.  Redness and flushing  Redness and flushing  Redness (sunburn type rash with peeling)   Shellfish Allergy Hives and Itching    All seafood   Timolol  Maleate Other (See Comments) and Shortness Of Breath    Redness  Other Reaction(s): Other (See Comments)  Shortness of breath  Redness  Redness  Higher Doses  Shortness of breath    Redness   Sulfa Antibiotics Other (See Comments)    Redness in eyes   Amoxicillin Rash   Amoxicillin-Pot Clavulanate Other (See Comments)    Redness and flushing   Brimonidine Other (See  Comments)    blisters   Cefuroxime Axetil Other (See Comments)    redness   Dipivefrin Other (See Comments)    sleepiness   Oxycodone      BP dropped  Other Reaction(s): Other (See Comments)  Dropped BP down too low  Dropped blood pressure   BP dropped  Dropped BP down too low    Patient Measurements: Height: 5' 4 (162.6 cm) Weight: 74.3 kg (163 lb 12.8 oz) IBW/kg (Calculated) : 54.7 HEPARIN  DW (KG): 70.2  Vital Signs: Temp: 97.7 F (36.5 C) (10/10 0956) Temp Source: Oral (10/10 0956) BP: 88/55 (10/10 0956) Pulse Rate: 105 (10/10 0956)  Labs: Recent Labs    08/03/24 0330 08/04/24 0504 08/04/24 1328  HGB 13.0  --   --   HCT 38.9  --   --   PLT 167  --   --   APTT  --   --  40*  LABPROT  --   --  17.8*  INR  --   --  1.4*  HEPARINUNFRC  --   --  <0.10*  CREATININE 0.82 0.83  --     Estimated Creatinine Clearance: 46.2 mL/min (by C-G formula based on SCr of 0.83 mg/dL).   Medical History: Past Medical History:  Diagnosis Date   Arthritis    Atrial fibrillation (HCC)    History of kidney stones    Hypothyroidism    Myocardial infarct (HCC)    PONV (postoperative nausea and vomiting)     Medications:  Medications Prior to Admission  Medication Sig Dispense  Refill Last Dose/Taking   acetaminophen  (TYLENOL ) 650 MG CR tablet Take 650 mg by mouth daily as needed for pain.   Taking As Needed   amiodarone  (PACERONE ) 200 MG tablet Take 2 tablets (400 mg total) by mouth 2 (two) times daily for 10 days, THEN 1 tablet (200 mg total) daily. (Patient taking differently:  (200 mg total) daily.) 70 tablet 0 07/30/2024 Morning   apixaban  (ELIQUIS ) 5 MG TABS tablet Take 1 tablet (5 mg total) by mouth 2 (two) times daily. 180 tablet 3 07/30/2024 Morning   atorvastatin  (LIPITOR ) 40 MG tablet Take 40 mg by mouth at bedtime.   07/29/2024 Bedtime   azelastine  (ASTELIN ) 0.1 % nasal spray Place 1 spray into both nostrils daily.   07/30/2024 Morning   bimatoprost (LUMIGAN) 0.03 %  ophthalmic solution Place 1 drop into the right eye at bedtime.   07/29/2024 Bedtime   Calcium  Carb-Cholecalciferol  (CALCIUM  CARBONATE-VITAMIN D3) 600-400 MG-UNIT TABS Take 1 tablet by mouth daily.   07/30/2024 Morning   Carboxymethylcellulose Sodium (THERATEARS) 0.25 % SOLN Place 1 drop into both eyes daily as needed (Dry eyes).   Taking As Needed   Cholecalciferol  (VITAMIN D ) 50 MCG (2000 UT) tablet Take 2,000 Units by mouth daily.   07/30/2024 Morning   Docusate Sodium  100 MG capsule Take 100 mg by mouth daily as needed for mild constipation or moderate constipation.   Taking As Needed   fexofenadine (ALLEGRA) 180 MG tablet Take 180 mg by mouth at bedtime.   07/29/2024 Bedtime   folic acid (FOLVITE) 400 MCG tablet Take 400 mcg by mouth daily.   Taking   ipratropium (ATROVENT) 0.06 % nasal spray Place 1 spray into both nostrils daily as needed for rhinitis.   Taking As Needed   isosorbide  mononitrate (IMDUR ) 30 MG 24 hr tablet Take 1 tablet (30 mg total) by mouth daily. 30 tablet 2 07/30/2024 Morning   levothyroxine  (SYNTHROID , LEVOTHROID) 100 MCG tablet Take 100 mcg by mouth daily before breakfast.   07/30/2024 Morning   lisinopril  (ZESTRIL ) 5 MG tablet Take 1 tablet (5 mg total) by mouth daily. 30 tablet 2 07/30/2024 Morning   LUMIGAN 0.01 % SOLN Place 1 drop into the right eye at bedtime.   Taking   metoprolol  tartrate (LOPRESSOR ) 25 MG tablet Take 0.5 tablets (12.5 mg total) by mouth daily as needed. for heart rate over 100.  Home med. 10 tablet 1 Unknown   Multiple Vitamin (MULTIVITAMIN WITH MINERALS) TABS tablet Take 1 tablet by mouth daily.   07/30/2024 Morning   Multiple Vitamins-Minerals (PRESERVISION AREDS 2 PO) Take 2 tablets by mouth 2 (two) times daily.   Taking   pantoprazole  (PROTONIX ) 40 MG tablet Take 40 mg by mouth daily.   07/30/2024 Morning   prednisoLONE acetate (PRED FORTE) 1 % ophthalmic suspension Place 1 drop into the right eye 4 (four) times daily.   Taking   timolol  (TIMOPTIC )  0.5 % ophthalmic solution Place 1 drop into the right eye in the morning.   07/30/2024 Morning   vitamin B-12 (CYANOCOBALAMIN ) 100 MCG tablet Take 100 mcg by mouth daily.   07/30/2024 Morning   furosemide  (LASIX ) 20 MG tablet Take 20 mg by mouth daily. prn (Patient not taking: Reported on 07/21/2024)       Assessment: 89 yo female with PMH of A. Fib, HTN, HLD, NSTEMI, and CAD admitted with small bowel obstruction s/p exploratory laparotomy and resection on 10/9. Patient remains NPO, TPN starting 10/10. Pharmacy has been consulted for heparin   infusion monitoring and dosing for A. Fib. Last apixaban  dose reported 10/5.  Update 10/10 following PICC placement -- brachial artery was struck, hemostasis now achieved. Heparin  will now be restarted 12 hours after procedure.  Goal of Therapy:  Heparin  level 0.3-0.7 units/ml Monitor platelets by anticoagulation protocol: Yes   Plan:  Restart heparin  infusion at 1000 units/hr at 0500 on 10/11 Check anti-Xa level in 8 hours after drip starts and daily while on heparin  Continue to monitor H&H and platelets  Will M. Lenon, PharmD, BCPS Clinical Pharmacist 08/04/2024 5:41 PM

## 2024-08-04 NOTE — Progress Notes (Signed)
 PHARMACY - TOTAL PARENTERAL NUTRITION CONSULT NOTE   Indication: Small bowel obstruction  Patient Measurements: Height: 5' 4 (162.6 cm) Weight: 74.3 kg (163 lb 12.8 oz) IBW/kg (Calculated) : 54.7 TPN AdjBW (KG): 59.6 Body mass index is 28.12 kg/m. Usual Weight: UNK  Assessment: 89yo female admitted with intractable vomiting and small bowel obstruction. Patient has PMH cholecystectomy, CAD, NSTEMI, HTN, HLD, A. Fib, and recent pacemaker. She has been NPO/clear liquids since admission. Patient reports Hives allergy to Shellfish. Allergy was confirmed with family to be to ALL seafood.   Glucose / Insulin: BG 90-140s Electrolytes: WNL, but high risk for refeeding  Renal: Scr <1 Hepatic: LFTs WNL Intake / Output; MIVF: D5/1/2NS w/KCL @ 100 ml/hr - Stop time 10/9 @ 1714 GI Imaging:  10/8 CT/ABDOMEN PELVIS Complex small bowel obstruction with 2 points of obstruction  GI Surgeries / Procedures: S/P exploratory laparotomy, small bowel resection, reduction of internal hernia, and repair or ventral hernia on 10/9  Central access: PICC order Placed 10/9 AM TPN start date: 08/04/2024  Nutritional Goals: Goal TPN rate is 70 mL/hr (provides 84g of protein and 1696 kcals per day)  RD Assessment: Estimated Needs Total Energy Estimated Needs: 1600-1800kcal/day Total Protein Estimated Needs: 80-90g/day Total Fluid Estimated Needs: 1.4-1.6L/day  Current Nutrition:  NPO, D5/1/2NS w/KCL 20mEq @ 100 ml/hr  Plan:  TPN not started 10/9 due to issues getting PICC placed. IR to place PICC today @ 1400.   Start TPN at 35mL/hr (1/2 rate) at 1800 Electrolytes in TPN: Na 58mEq/L, K 50mEq/L, Ca 1mEq/L, Mg 80mEq/L, and Phos 15mmol/L. Cl:Ac 1:1 Due to allergy to ALL Seafoods- INTRALIPIDS used instead of SMOFLIPIDS Add standard MVI and trace elements to TPN Thiamine 100mg  IV x 7 days ordered (Day2)  Initiate Sensitive q6h SSI and adjust as needed  Monitor TPN labs on Mon/Thurs. BMP, Phos, and  Mag monitored daily until stable.   Estill CHRISTELLA Lutes, PharmD, BCPS Clinical Pharmacist 08/04/2024 10:49 AM

## 2024-08-04 NOTE — Procedures (Signed)
 Interventional Radiology Procedure Note  Procedure: Placement of 32 cm dual-lumen PICC via RIGHT brachial vein.  Tip in SVC and ready for use.   Complications: Initial brachial artery puncture, pressure held and hemostasis achieved  Estimated Blood Loss: 50 mL  Recommendations: - PICC ready for use - RIGHT arm in sling and bedrest overnight.  Ok to resume activity tomorrow  Signed,  Wilkie LOIS Lent, MD

## 2024-08-04 NOTE — Consult Note (Signed)
 [facility] CLINIC CARDIOLOGY CONSULT NOTE       Patient ID: Maria Trujillo MRN: [id number] DOB/AGE: 1934/06/22 89 y.o.  Admit date: 07/30/2024 Referring Physician Dr. Amaryllis Dare Primary Physician Feldpausch, Cheryl BRAVO, MD  Primary Cardiologist Dr. Wilburn Reason for Consultation AF RVR  HPI: Maria Trujillo is a 89 y.o. female  with a past medical history of  persistent atrial fibrillation (DCCV 02/2023, on Tikosyn ), hypertension, hyperlipidemia, history bradycardia, history NSTEMI (10/2020), coronary artery disease s/p stent to RCA (10/2020) who presented to the ED on 07/30/2024 for intractable vomiting and abdominal pain. Admitted for small bowel obstruction/ileus. 08/04/2024 HR elevated, AF RVR. Cardiology was consulted for further evaluation.   Patient initially came to the ED earlier this week with complaints of intractable vomiting and abdominal pain notable, initially treated conservatively for small bowel obstruction however did not improve and thus underwent surgery yesterday.  Labs today include creatinine 0.83, potassium 3.8, yesterday hemoglobin 13.0, WBC 8.7. EKG in the ED normal sinus rhythm rate 60 bpm.  Exploratory laparotomy yesterday with reduction of internal hernia and small bowel resection with primary anastomosis.  Today she developed atrial fibrillation RVR, has been n.p.o. and has not been receiving her home amiodarone  or Eliquis .  At the time my evaluation this morning, patient is resting in hospital bed with daughter present at bedside.  She states that overall she feels better today.  She denies any significant nausea/vomiting or abdominal pain.  Also denies any chest pain, palpitations, shortness of breath.  Not on telemetry at the time my evaluation for assessment of heart rate.  Review of systems complete and found to be negative unless listed above    Past Medical History:  Diagnosis Date   Arthritis    Atrial fibrillation (HCC)    History of kidney stones     Hypothyroidism    Myocardial infarct (HCC)    PONV (postoperative nausea and vomiting)     Past Surgical History:  Procedure Laterality Date   BOWEL RESECTION N/A 08/03/2024   Procedure: EXCISION, SMALL INTESTINE;  Surgeon: Jordis Laneta FALCON, MD;  Location: [facility] ORS;  Service: General;  Laterality: N/A;   BREAST EXCISIONAL BIOPSY Bilateral 1984   benign   CARDIOVERSION N/A 03/04/2023   Procedure: CARDIOVERSION;  Surgeon: Ammon Blunt, MD;  Location: [facility] ORS;  Service: Cardiovascular;  Laterality: N/A;   CATARACT EXTRACTION, BILATERAL     CHOLECYSTECTOMY     EYE SURGERY     JOINT REPLACEMENT Left 12/2012   knee   KNEE ARTHROPLASTY Right 02/15/2017   Procedure: COMPUTER ASSISTED TOTAL KNEE ARTHROPLASTY;  Surgeon: Lynwood SHAUNNA Hue, MD;  Location: [facility] ORS;  Service: Orthopedics;  Laterality: Right;   LAPAROTOMY N/A 08/03/2024   Procedure: LAPAROTOMY, EXPLORATORY;  Surgeon: Jordis Laneta FALCON, MD;  Location: [facility] ORS;  Service: General;  Laterality: N/A;   LEFT HEART CATH AND CORONARY ANGIOGRAPHY N/A 11/04/2020   Procedure: LEFT HEART CATH AND CORONARY ANGIOGRAPHY;  Surgeon: Ammon Blunt, MD;  Location: [facility] INVASIVE CV LAB;  Service: Cardiovascular;  Laterality: N/A;   PACEMAKER IMPLANT N/A 07/24/2024   Procedure: PACEMAKER IMPLANT;  Surgeon: Cindie Ole DASEN, MD;  Location: [facility] INVASIVE CV LAB;  Service: Cardiovascular;  Laterality: N/A;   PHOTOCOAGULATION WITH LASER Right 04/20/2018   Procedure: TRANSCLERAL DIODE CYCLOPHOTOCOAGULATION  RIGHT per Hope block;  Surgeon: Mittie Gaskin, MD;  Location: [facility];  Service: Ophthalmology;  Laterality: Right;   ROTATOR CUFF REPAIR Left 2010   TONSILLECTOMY     VENTRAL HERNIA  REPAIR N/A 08/03/2024   Procedure: REPAIR, HERNIA, VENTRAL;  Surgeon: Jordis Laneta FALCON, MD;  Location: [facility] ORS;  Service: General;  Laterality: N/A;    Medications Prior to Admission  Medication Sig Dispense Refill Last Dose/Taking   acetaminophen   (TYLENOL ) 650 MG CR tablet Take 650 mg by mouth daily as needed for pain.   Taking As Needed   amiodarone  (PACERONE ) 200 MG tablet Take 2 tablets (400 mg total) by mouth 2 (two) times daily for 10 days, THEN 1 tablet (200 mg total) daily. (Patient taking differently:  (200 mg total) daily.) 70 tablet 0 07/30/2024 Morning   apixaban  (ELIQUIS ) 5 MG TABS tablet Take 1 tablet (5 mg total) by mouth 2 (two) times daily. 180 tablet 3 07/30/2024 Morning   atorvastatin  (LIPITOR ) 40 MG tablet Take 40 mg by mouth at bedtime.   07/29/2024 Bedtime   azelastine  (ASTELIN ) 0.1 % nasal spray Place 1 spray into both nostrils daily.   07/30/2024 Morning   bimatoprost (LUMIGAN) 0.03 % ophthalmic solution Place 1 drop into the right eye at bedtime.   07/29/2024 Bedtime   Calcium  Carb-Cholecalciferol  (CALCIUM  CARBONATE-VITAMIN D3) 600-400 MG-UNIT TABS Take 1 tablet by mouth daily.   07/30/2024 Morning   Carboxymethylcellulose Sodium (THERATEARS) 0.25 % SOLN Place 1 drop into both eyes daily as needed (Dry eyes).   Taking As Needed   Cholecalciferol  (VITAMIN D ) 50 MCG (2000 UT) tablet Take 2,000 Units by mouth daily.   07/30/2024 Morning   Docusate Sodium  100 MG capsule Take 100 mg by mouth daily as needed for mild constipation or moderate constipation.   Taking As Needed   fexofenadine (ALLEGRA) 180 MG tablet Take 180 mg by mouth at bedtime.   07/29/2024 Bedtime   folic acid (FOLVITE) 400 MCG tablet Take 400 mcg by mouth daily.   Taking   ipratropium (ATROVENT) 0.06 % nasal spray Place 1 spray into both nostrils daily as needed for rhinitis.   Taking As Needed   isosorbide  mononitrate (IMDUR ) 30 MG 24 hr tablet Take 1 tablet (30 mg total) by mouth daily. 30 tablet 2 07/30/2024 Morning   levothyroxine  (SYNTHROID , LEVOTHROID) 100 MCG tablet Take 100 mcg by mouth daily before breakfast.   07/30/2024 Morning   lisinopril  (ZESTRIL ) 5 MG tablet Take 1 tablet (5 mg total) by mouth daily. 30 tablet 2 07/30/2024 Morning   LUMIGAN 0.01 % SOLN  Place 1 drop into the right eye at bedtime.   Taking   metoprolol  tartrate (LOPRESSOR ) 25 MG tablet Take 0.5 tablets (12.5 mg total) by mouth daily as needed. for heart rate over 100.  Home med. 10 tablet 1 Unknown   Multiple Vitamin (MULTIVITAMIN WITH MINERALS) TABS tablet Take 1 tablet by mouth daily.   07/30/2024 Morning   Multiple Vitamins-Minerals (PRESERVISION AREDS 2 PO) Take 2 tablets by mouth 2 (two) times daily.   Taking   pantoprazole  (PROTONIX ) 40 MG tablet Take 40 mg by mouth daily.   07/30/2024 Morning   prednisoLONE acetate (PRED FORTE) 1 % ophthalmic suspension Place 1 drop into the right eye 4 (four) times daily.   Taking   timolol  (TIMOPTIC ) 0.5 % ophthalmic solution Place 1 drop into the right eye in the morning.   07/30/2024 Morning   vitamin B-12 (CYANOCOBALAMIN ) 100 MCG tablet Take 100 mcg by mouth daily.   07/30/2024 Morning   furosemide  (LASIX ) 20 MG tablet Take 20 mg by mouth daily. prn (Patient not taking: Reported on 07/21/2024)      Social History  Socioeconomic History   Marital status: Widowed    Spouse name: Not on file   Number of children: Not on file   Years of education: Not on file   Highest education level: Not on file  Occupational History   Not on file  Tobacco Use   Smoking status: Never   Smokeless tobacco: Never  Vaping Use   Vaping status: Never Used  Substance and Sexual Activity   Alcohol  use: No   Drug use: No   Sexual activity: Not on file  Other Topics Concern   Not on file  Social History Narrative   Not on file   Social Drivers of Health   Financial Resource Strain: Low Risk  (01/20/2024)   Received from [facility]   Overall Financial Resource Strain (CARDIA)    Difficulty of Paying Living Expenses: Not hard at all  Food Insecurity: No Food Insecurity (07/30/2024)   Hunger Vital Sign    Worried About Running Out of Food in the Last Year: Never true    Ran Out of Food in the Last Year: Never true  Transportation  Needs: No Transportation Needs (07/30/2024)   PRAPARE - Administrator, Civil Service (Medical): No    Lack of Transportation (Non-Medical): No  Physical Activity: Inactive (11/03/2018)   Received from [facility]   Exercise Vital Sign    Days of Exercise per Week: 0 days    Minutes of Exercise per Session: 0 min  Stress: No Stress Concern Present (11/03/2018)   Received from [facility] - Occupational Stress Questionnaire    Feeling of Stress : Not at all  Social Connections: Moderately Isolated (07/30/2024)   Social Connection and Isolation Panel    Frequency of Communication with Friends and Family: Three times a week    Frequency of Social Gatherings with Friends and Family: Once a week    Attends Religious Services: More than 4 times per year    Active Member of [employer] or Organizations: No    Attends Banker Meetings: Never    Marital Status: Widowed  Intimate Partner Violence: Not At Risk (07/30/2024)   Humiliation, Afraid, Rape, and Kick questionnaire    Fear of Current or Ex-Partner: No    Emotionally Abused: No    Physically Abused: No    Sexually Abused: No    Family History  Problem Relation Age of Onset   Breast cancer Sister 57     Vitals:   08/03/24 1903 08/03/24 1950 08/04/24 0448 08/04/24 0956  BP:  (!) 93/55 95/73 (!) 88/55  Pulse:  84 82 (!) 105  Resp:  16 18 16   Temp:  (!) 97.4 F (36.3 C) 98.1 F (36.7 C) 97.7 F (36.5 C)  TempSrc:   Oral Oral  SpO2:  97% 96% 97%  Weight: 74.3 kg     Height:        PHYSICAL EXAM General: Ill-appearing elderly female, well nourished, in no acute distress. HEENT: Normocephalic and atraumatic. Neck: No JVD.  Lungs: Normal respiratory effort on room air. Clear bilaterally to auscultation. No wheezes, crackles, rhonchi.  Heart: Irregularly irregular, elevated rate. Normal S1 and S2 without gallops or murmurs.  Abdomen: Non-distended appearing.   Msk: Normal strength and tone for age. Extremities: Warm and well perfused. No clubbing, cyanosis.  No edema.  Neuro: Alert and oriented X 3. Psych: Answers questions appropriately.   Labs: Basic Metabolic Panel:  Recent Labs    08/03/24 0330 08/04/24 0504  NA 135 132*  K 3.6 3.8  CL 102 103  CO2 23 22  GLUCOSE 113* 112*  BUN 33* 32*  CREATININE 0.82 0.83  CALCIUM  8.4* 8.7*  MG 2.1 1.8  PHOS 2.8 3.0   Liver Function Tests: No results for input(s): AST, ALT, ALKPHOS, BILITOT, PROT, ALBUMIN in the last 72 hours. No results for input(s): LIPASE, AMYLASE in the last 72 hours. CBC: Recent Labs    08/03/24 0330  WBC 8.7  HGB 13.0  HCT 38.9  MCV 90.5  PLT 167   Cardiac Enzymes: No results for input(s): CKTOTAL, CKMB, CKMBINDEX, TROPONINIHS in the last 72 hours. BNP: No results for input(s): BNP in the last 72 hours. D-Dimer: No results for input(s): DDIMER in the last 72 hours. Hemoglobin A1C: Recent Labs    08/04/24 0504  HGBA1C 5.1   Fasting Lipid Panel: No results for input(s): CHOL, HDL, LDLCALC, TRIG, CHOLHDL, LDLDIRECT in the last 72 hours. Thyroid  Function Tests: No results for input(s): TSH, T4TOTAL, T3FREE, THYROIDAB in the last 72 hours.  Invalid input(s): FREET3 Anemia Panel: No results for input(s): VITAMINB12, FOLATE, FERRITIN, TIBC, IRON, RETICCTPCT in the last 72 hours.   Radiology: US  EKG SITE RITE Result Date: 08/03/2024 If Site Rite image not attached, placement could not be confirmed due to current cardiac rhythm.  DG Abd 1 View Result Date: 08/02/2024 CLINICAL DATA:  Nasogastric tube placement EXAM: ABDOMEN - 1 VIEW COMPARISON:  None Available. FINDINGS: No nasogastric tube is identified within visualized lung bases and upper abdomen. Dilated fluid-filled loops of bowel noted on prior CT examination is not well appreciated on this exam. No free intraperitoneal gas. Pacemaker in  place. IMPRESSION: 1. No nasogastric tube identified. Correlate for malposition within the oropharynx is recommended. Electronically Signed   By: Dorethia Molt M.D.   On: 08/02/2024 23:33   CT ABDOMEN PELVIS W CONTRAST Result Date: 08/02/2024 EXAM: CT ABDOMEN AND PELVIS WITH CONTRAST 08/02/2024 07:14:10 PM TECHNIQUE: CT of the abdomen and pelvis was performed with the administration of intravenous contrast. Multiplanar reformatted images are provided for review. Automated exposure control, iterative reconstruction, and/or weight-based adjustment of the mA/kV was utilized to reduce the radiation dose to as low as reasonably achievable. COMPARISON: None available. CLINICAL HISTORY: Abdominal pain, acute, nonlocalized. 89 y.o. female with medical history significant for CAD with prior NSTEMI, HTN, HLD, persistent A-fib on Eliquis  and amiodarone , complicated by post conversion pauses/bradycardia with syncope 04/2024 s/p pacemaker on 07/24/2024 -(Eliquis  held 9/26 to 9/30), being admitted for small bowel obstruction. She presented with intractable nonbloody, nonbilious, non-coffee-ground vomiting starting on the night prior to arrival associated with lower abdominal pain. FINDINGS: LOWER CHEST: Pacemaker leads within the right heart. Cardiac size within normal limits. Moderate coronary artery calcification. LIVER: The liver is unremarkable. GALLBLADDER AND BILE DUCTS: Status post cholecystectomy. SPLEEN: No acute abnormality. PANCREAS: No acute abnormality. ADRENAL GLANDS: No acute abnormality. KIDNEYS, URETERS AND BLADDER: No stones in the kidneys or ureters. No hydronephrosis. No perinephric or periureteral stranding. Urinary bladder is unremarkable. GI AND BOWEL: There was a complex small bowel obstruction with 2 points of transitioning close proximity seen within the right hemipelvis on axial image 63, series 2 and coronal image 45 series 5. The proximal small bowel is dilated and fluid-filled. However, the  intervening small bowel within the pelvis appears not only dilated and fluid-filled but demonstrates extensive mesenteric edema in keeping with a closed loop obstruction of this  segment. The terminal small bowel is decompressed. The colon is unremarkable. The stomach is unremarkable. PERITONEUM AND RETROPERITONEUM: Trace free fluid within the pelvis. No free intraperitoneal gas. No ascites. VASCULATURE: Moderate aortoiliac atherosclerotic calcification. No aortic aneurysm. Aorta is normal in caliber. LYMPH NODES: No lymphadenopathy. REPRODUCTIVE ORGANS: Uterus absent. No adnexal masses. BONES AND SOFT TISSUES: Osseous structures are age appropriate. No acute bone abnormality. No lytic or blastic bone lesion. IMPRESSION: 1. Complex small bowel obstruction with 2 points of obstruction in close proximity likely related to an underlying adhesion within the right hemipelvis. Resultant closed loop obstruction of the intervening small bowel in the right hemipelvis, evidenced by dilated, fluid-filled intervening small bowel with extensive mesenteric edema. No free intraperitoneal gas. 2. Trace free fluid within the pelvis. 3. These results will be called to the ordering clinician or representative by the radiologist assistant, and communication documented in the pacs or clario dashboard. Electronically signed by: Dorethia Molt MD 08/02/2024 07:40 PM EDT RP Workstation: [id number]   DG ABD ACUTE 2+V W 1V CHEST Result Date: 08/02/2024 CLINICAL DATA:  Left hand small bowel obstruction EXAM: DG ABDOMEN ACUTE WITH 1 VIEW CHEST COMPARISON:  August 01, 2024 FINDINGS: Interval removal of enteric tube. Similar appearance of dilated loops of small bowel in the upper abdomen with persistent air-fluid levels again identified. No free air. Cholecystectomy clips. Unchanged cardiac pacemaker.  No acute osseous findings. IMPRESSION: Interval removal of enteric tube. Similar appearance of dilated loop of small bowel in the upper abdomen  with persistent air-fluid levels. Electronically Signed   By: Michaeline Blanch M.D.   On: 08/02/2024 11:23   DG ABD ACUTE 2+V W 1V CHEST Result Date: 08/01/2024 CLINICAL DATA:  Small bowel obstruction EXAM: DG ABDOMEN ACUTE WITH 1 VIEW CHEST COMPARISON:  July 30, 2024 FINDINGS: Unchanged enteric tube with tip in side hole projecting over the stomach. Scattered air-fluid levels with a few dilated loops of small bowel again identified in the upper abdomen, although there is paucity of visualized bowel gas limiting evaluation somewhat. No free air. No focal consolidations. No pleural effusions or pneumothorax. Left chest wall pacemaker. IMPRESSION: Enteric tube in place. Paucity of visualized bowel gas, limiting evaluation, although there appears to be persistent dilated loop of bowel in the upper abdomen with scattered air-fluid levels again identified. Electronically Signed   By: Michaeline Blanch M.D.   On: 08/01/2024 10:57   DG Abd 1 View Result Date: 07/30/2024 CLINICAL DATA:  Nasogastric tube placement. EXAM: ABDOMEN - 1 VIEW COMPARISON:  CT earlier today FINDINGS: Tip and side port of the enteric tube below the diaphragm in the stomach. Excreted IV contrast in the renal collecting systems from prior CT. Dilated small bowel in CT is fluid-filled and not well demonstrated by radiograph. IMPRESSION: Tip and side port of the enteric tube below the diaphragm in the stomach. Electronically Signed   By: Andrea Gasman M.D.   On: 07/30/2024 22:48   CT ABDOMEN PELVIS W CONTRAST Result Date: 07/30/2024 CLINICAL DATA:  Abdominal pain. Intractable vomiting. Recent pacemaker placement. Prior cholecystectomy. EXAM: CT ABDOMEN AND PELVIS WITH CONTRAST TECHNIQUE: Multidetector CT imaging of the abdomen and pelvis was performed using the standard protocol following bolus administration of intravenous contrast. RADIATION DOSE REDUCTION: This exam was performed according to the departmental dose-optimization program which  includes automated exposure control, adjustment of the mA and/or kV according to patient size and/or use of iterative reconstruction technique. CONTRAST:  OMNIPAQUE  IOHEXOL  300 MG/ML  SOLN COMPARISON:  Noncontrast  CT 12/11/2014 FINDINGS: Lower chest: Pacemaker leads overlie the right atrium and ventricle. No pericardial effusion. No basilar airspace disease or pleural effusion. Coronary artery calcifications. Hepatobiliary: No focal liver abnormality. Cholecystectomy. Mild central intrahepatic biliary ductal dilatation. No common bile duct dilatation. Pancreas: No ductal dilatation or inflammation. Spleen: Normal in size without focal abnormality. Adrenals/Urinary Tract: No adrenal nodule. No hydronephrosis. No renal calculi. No suspicious renal abnormality. Partially distended urinary bladder, normal for degree of distension. Stomach/Bowel: Dilated fluid-filled stomach. Dilated fluid-filled small bowel. Suspected transition point in the right lower quadrant, series 2, image 61, the distal small bowel is decompressed. There is mesenteric edema and free fluid. No bowel pneumatosis. Small to moderate volume of stool in the colon. No colonic inflammation. Normal appendix is potentially visualized. Vascular/Lymphatic: Aortic atherosclerosis and tortuosity. No aortic aneurysm. Patent portal vein. No portal venous or mesenteric gas. No suspicious lymphadenopathy. Reproductive: Normal for age. Other: Generalized mesenteric edema. Free fluid in the mesentery, abdomen and pelvis. No free air or focal fluid collection. Musculoskeletal: Multilevel degenerative change in the spine. There are no acute or suspicious osseous abnormalities. IMPRESSION: 1. Small bowel obstruction with suspected transition point in the right lower quadrant. Mesenteric edema and free fluid. 2. Aortic Atherosclerosis (ICD10-I70.0). Electronically Signed   By: Andrea Gasman M.D.   On: 07/30/2024 20:27   DG Chest 2 View Result Date:  07/24/2024 CLINICAL DATA:  Pacemaker. EXAM: CHEST - 2 VIEW COMPARISON:  Radiograph 07/05/2024 FINDINGS: Dual lead left-sided pacemaker with lead tips projecting over the right atrium and ventricle.The cardiomediastinal contours are normal. Aortic atherosclerosis. Unchanged elevation of right hemidiaphragm. Pulmonary vasculature is normal. No consolidation, pleural effusion, or pneumothorax. Chronic distal left clavicle deformity. No acute osseous abnormalities are seen. IMPRESSION: Dual lead left-sided pacemaker with lead tips projecting over the right atrium and ventricle. No pneumothorax. Electronically Signed   By: Andrea Gasman M.D.   On: 07/24/2024 18:38   EP PPM/ICD IMPLANT Result Date: 07/24/2024  CONCLUSIONS:  1. Symptomatic bradycardia due to tachycardia-bradycardia syndrome  2. Successful dual chamber permanent pacemaker implantation  3.  No early apparent complications.  4. Hold Eliquis  and Aspirin  for 5 days (OK to restart July 30, 2024)   DG Chest 2 View Result Date: 07/05/2024 EXAM: 2 VIEW(S) XRAY OF THE CHEST 07/05/2024 07:07:00 PM COMPARISON: 05/23/2024 CLINICAL HISTORY: Tachy. Rapid heart rate all day. Denies chest pain or SOB. Pt took metoprolol  x2 today. No n/v pt alert speech clear. Hx afib. FINDINGS: LUNGS AND PLEURA: No focal pulmonary opacity. No pulmonary edema. No pleural effusion. No pneumothorax. HEART AND MEDIASTINUM: Stable cardiomediastinal silhouette. Aortic atherosclerotic calcification. BONES AND SOFT TISSUES: No acute osseous abnormality. IMPRESSION: 1. No acute findings. Electronically signed by: Norman Gatlin MD 07/05/2024 07:14 PM EDT RP Workstation: [id number]    ECHO 05/2024: NORMAL LEFT VENTRICULAR SYSTOLIC FUNCTION WITH MILD LVH  ESTIMATED EF: 55%  NORMAL LA PRESSURES WITH DIASTOLIC DYSFUNCTION (GRADE 1)  NORMAL RIGHT VENTRICULAR SYSTOLIC FUNCTION  VALVULAR REGURGITATION: No AR, MILD MR, No PR, MILD TR  ESTIMATED RVSP: 36 mmHg (Normal)  NO VALVULAR  STENOSIS   TELEMETRY reviewed by me 08/04/2024: Not on telemetry  EKG reviewed by me: NSR rate 60 bpm  Data reviewed by me 08/04/2024: last 24h vitals tele labs imaging I/O ED provider note, admission H&P  Principal Problem:   SBO (small bowel obstruction) (HCC) Active Problems:   HTN (hypertension)   Acquired hypothyroidism   Coronary artery disease with history of NSTEMI   Status cardiac pacemaker 07/24/2024  for sinus pause with syncope   Permanent atrial fibrillation (HCC)   Chronic anticoagulation   Small bowel obstruction (HCC)    ASSESSMENT AND PLAN:  Maria Trujillo is a 89 y.o. female  with a past medical history of  persistent atrial fibrillation (DCCV 02/2023, on Tikosyn ), hypertension, hyperlipidemia, history bradycardia, history NSTEMI (10/2020), coronary artery disease s/p stent to RCA (10/2020) who presented to the ED on 07/30/2024 for intractable vomiting and abdominal pain. Admitted for small bowel obstruction/ileus. 08/04/2024 HR elevated, AF RVR. Cardiology was consulted for further evaluation.   # Small bowel obstruction s/p ex lap and resection 08/03/24 # Atrial fibrillation RVR # Paroxysmal atrial fibrillation # Coronary artery disease # Hypertension Patient initially presented with nausea/vomiting related to small bowel obstruction.  Underwent exploratory laparotomy yesterday for small bowel resection.  Has a history of atrial fibrillation, underwent cardioversion last year and had been maintaining sinus rhythm on amiodarone  at home.  Developed atrial fibrillation RVR this morning.  Remains NPO. - Will start IV amiodarone  for rhythm control. - Will start heparin  for anticoagulation, per surgery okay to start infusion with no bolus to avoid bleeding complications given she is postop day 1. - Has as needed IV metoprolol  for elevated heart rate as well as as needed IV hydralazine  for elevated BP.  This patient's plan of care was discussed and created with Dr. Ammon  and he is in agreement.  Signed: Danita Bloch, PA-C  08/04/2024, 10:26 AM [facility]

## 2024-08-04 NOTE — Progress Notes (Addendum)
 [facility] SURGICAL PROGRESS NOTE  Hospital Day(s): 4.   Post op day(s): 1 Day Post-Op.   Interval History:  Patient seen and examined No acute events or new complaints overnight.  Patient reports she is doing well Abdomen is sore expectedly She is anxious to get out of bed No fever, chills, nausea Renal function normal; sCr - 0.83; UO - 770 ccs Mild hyponatremia to 132; no other electrolyte derangements NGT in place; output 900 ccs Plan for IR line placement  No reported flatus   Vital signs in last 24 hours: [min-max] current  Temp:  [96.7 F (35.9 C)-98.8 F (37.1 C)] 98.1 F (36.7 C) (10/10 0448) Pulse Rate:  [82-128] 82 (10/10 0448) Resp:  [16-23] 18 (10/10 0448) BP: (93-117)/(53-73) 95/73 (10/10 0448) SpO2:  [92 %-98 %] 96 % (10/10 0448) Weight:  [74.3 kg] 74.3 kg (10/09 1903)     Height: 5' 4 (162.6 cm) Weight: 74.3 kg BMI (Calculated): 28.1   Intake/Output last 2 shifts:  10/09 0701 - 10/10 0700 In: 4371.8 [I.V.:3011.8; NG/GT:60; IV Piggyback:1300] Out: 2970 [Urine:770; Emesis/NG output:900]   Physical Exam:  Constitutional: alert, cooperative and no distress  HEENT: NGT in place Respiratory: breathing non-labored at rest  Cardiovascular: regular rate and sinus rhythm  Gastrointestinal: soft, incisional soreness, and non-distended. No rebound/guarding Genitourinary: Foley catheter in place; good UO Integumentary: Laparotomy is CDI with staples and honeycomb, no erythema   Labs:     Latest Ref Rng & Units 08/03/2024    3:30 AM 08/01/2024    4:01 AM 07/31/2024    4:15 AM  CBC  WBC 4.0 - 10.5 K/uL 8.7  8.0  11.3   Hemoglobin 12.0 - 15.0 g/dL 86.9  87.7  87.6   Hematocrit 36.0 - 46.0 % 38.9  36.4  36.5   Platelets 150 - 400 K/uL 167  142  148       Latest Ref Rng & Units 08/04/2024    5:04 AM 08/03/2024    3:30 AM 08/01/2024    4:01 AM  CMP  Glucose 70 - 99 mg/dL 887  886  97   BUN 8 - 23 mg/dL 32  33  29   Creatinine 0.44 - 1.00  mg/dL 9.16  9.17  9.30   Sodium 135 - 145 mmol/L 132  135  139   Potassium 3.5 - 5.1 mmol/L 3.8  3.6  3.9   Chloride 98 - 111 mmol/L 103  102  108   CO2 22 - 32 mmol/L 22  23  25    Calcium  8.9 - 10.3 mg/dL 8.7  8.4  8.9      Imaging studies: No new pertinent imaging studies   Assessment/Plan: 89 y.o. female 1 Day Post-Op s/p exploratory laparotomy, reduction of internal hernia, SBR, and repair of ventral hernia    - Unfortunately, patient not felt safe for PICC placement with IV team given recent pacemaker placement. Discussed with IR who is agreeable with placement; anticipate this being completed today; recommend right internal jugular line  - She will still benefit from TPN given prolonged NPO status   - Continue NGT decompression; LIS; monitor and record output   - Okay to discontinue foley catheter; good UO, sCr normal  - Monitor abdominal examination; on-going bowel function   - Pain control prn; antiemetics prn  - Mobilize; Okay to work with PT   - Further management per primary service; we will follow    All of the above findings and recommendations  were discussed with the patient, patient's family (daughter at bedside), and the medical team, and all of patient's and family's questions were answered to their expressed satisfaction.  -- Arthea Platt, PA-C [facility] 08/04/2024, 7:45 AM M-F: 7am - 4pm

## 2024-08-04 NOTE — Progress Notes (Signed)
 PT Cancellation Note  Patient Details Name: Maria Trujillo MRN: [id number] DOB: [birth year]   Cancelled Treatment:    Reason Eval/Treat Not Completed: Patient at procedure or test/unavailable (Chart, reviewed, evaluation attempted, pt going OTF for PICC placement.)   3:12 PM, 08/04/24 Peggye JAYSON Linear, PT, DPT Physical Therapist - [facility]  Outpatient Physical Therapy- Main Campus [phone]      Markale Birdsell C 08/04/2024, 3:10 PM

## 2024-08-04 NOTE — Progress Notes (Signed)
 Pt had picc placed in IR [facility], during placement brachial artery was stuck with needle, pressure held for 25 minutes, hemostasis achieved.  Picc line placed after hemostasis, per Dr Karalee pt is to NOT use right arm for 24 hours, and be kept in sling.  This was reported to pt's RN on floor [facility]).  [employer] R-TR, RCIS

## 2024-08-04 NOTE — Progress Notes (Signed)
 Progress Note   Patient: Maria Trujillo FMW:[id number] DOB: July 01, 1934 DOA: 07/30/2024     4 DOS: the patient was seen and examined on 08/04/2024   Brief hospital course: Partly taken from H&P.  Maria Trujillo is a 89 y.o. female with medical history significant for CAD with prior NSTEMI, HTN, HLD, persistent A-fib on Eliquis  and amiodarone , complicated by post conversion  pauses/bradycardia with syncope 04/2024 s/p pacemaker on 07/24/2024 -(Eliquis  held 9/26 to 9/30), being admitted for small bowel obstruction.  She presented with intractable nonbloody, nonbilious, non-coffee-ground vomiting starting on the night prior to arrival associated with lower abdominal pain.   On presentation stable vitals, labs with leukocytosis of 12.4 and UA with some ketones otherwise stable labs.  CT abdomen and pelvis showed SBO with suspected transition point in the right lower quadrant and mesenteric edema with free fluid.  NG tube was placed and general surgery was consulted.  10/6: Vital stable, improving leukocytosis now at 11.3, Significant drainage of about 1700 cc with NG tube. Continuing conservative management and general surgery might do Gastrografin challenge tomorrow if remained unchanged.  10/7: Vital stable, still no bowel movement or flatus, repeat KUB with persistently dilated loops of bowel with scattered air-fluid levels.  Continuing with conservative management, general surgery might try clamping NG tube today.  10/8: Hemodynamically stable, NG tube was removed and patient was started on clear liquid diet, still no BM or flatus, KUB with persistently dilated loops of bowel.  Surgery would like to give her a trial of some p.o. intake before doing Gastrografin challenge.  Surgery later decided to take her to the OR tomorrow morning, failed Gastrografin challenge.  10/9: Vital stable, s/p exploratory laparotomy with reduction of internal hernia and resection of small bowel with primary anastomosis  and adhesion lysis. General surgery wants her to be started on TPN as expecting prolonged ileus, patient recently had a pacemaker in place and cardiology is little hesitant to use a central line.  IR was consulted to avoid pacemaker leads.  10/10: Hemodynamically stable but still no bowel function.  IR was consulted for central line placement because of her recent pacemaker, followed by starting TPN.  Foley catheter was removed to give her a voiding trial.  Patient is very frail, consulting palliative care to clarify goals of care.  Currently full code  Assessment and Plan: * SBO (small bowel obstruction) (HCC) S/p laparotomy with some bowel resection and adhesion of lysis along with internal hernia reduction on 08/03/2024.  Still no bowel function -Continue with NG tube for now - Surgery would like to start her on TPN as expecting prolonged ileus. - IR was consulted as patient has a recent pacemaker placed and simple PICC line can dislodge any lead. -Continue with supportive care  Coronary artery disease with history of NSTEMI Continue atorvastatin , Imdur  and lisinopril  when tolerating n.p.o.  Status cardiac pacemaker 07/24/2024 for sinus pause with syncope History of sinus pauses on antiarrhythmics No acute issues suspected Patient denies chest pain, shortness of breath or lightheadedness Troponin negative. - Patient will need a central line for TPN which will increase the risk of infection or lead disruption.  Permanent atrial fibrillation (HCC) Chronic anticoagulation Will hold Eliquis  in case of need for surgical intervention Currently on heparin  infusion  Patient developed RVR this morning.  Cardiology was consulted and patient was started on amiodarone  infusion  HTN (hypertension) Blood pressure currently soft Hydralazine  as needed while NPO  Acquired hypothyroidism Holding home Synthroid  while she is  n.p.o. Will resume once able to take by mouth   Subjective: Patient  was having some abdominal discomfort.  No chest pain or shortness of breath.  Still no flatus or bowel movement.  Physical Exam: Vitals:   08/03/24 1903 08/03/24 1950 08/04/24 0448 08/04/24 0956  BP:  (!) 93/55 95/73 (!) 88/55  Pulse:  84 82 (!) 105  Resp:  16 18 16   Temp:  (!) 97.4 F (36.3 C) 98.1 F (36.7 C) 97.7 F (36.5 C)  TempSrc:   Oral Oral  SpO2:  97% 96% 97%  Weight: 74.3 kg     Height:       General.  Frail and malnourished elderly lady, in no acute distress. Pulmonary.  Lungs clear bilaterally, normal respiratory effort. CV.  Irregularly irregular with tachycardia Abdomen.  Mildly distended with mild diffuse tenderness, hypoactive bowel sounds CNS.  Alert and oriented .  No focal neurologic deficit. Extremities.  No edema, pulses intact and symmetrical. Psychiatry.  Judgment and insight appears normal.    Data Reviewed: Prior data reviewed  Family Communication: Discussed with daughter at bedside  Disposition: Status is: Inpatient Remains inpatient appropriate because: Severity of illness  Planned Discharge Destination: Home  DVT prophylaxis.  Lovenox  Time spent: 50 minutes  This record has been created using Conservation officer, historic buildings. Errors have been sought and corrected,but may not always be located. Such creation errors do not reflect on the standard of care.   Author: Amaryllis Dare, MD 08/04/2024 4:38 PM  For on call review [email or web address].

## 2024-08-04 NOTE — Progress Notes (Signed)
 Pt only urinated once this morning after I took her Foley out at about 10 a.m. No urine since then and no feeling to urinate per pt. Bladder scanned her 30 min ago but unable to visualize d/t midline honeycomb dressing and incision in the way of bladder. Dr. Caleen notified. Orders received to continue to monitor pt. Charge nurse Praweena notified who received the pt.

## 2024-08-04 NOTE — Plan of Care (Signed)

## 2024-08-04 NOTE — Progress Notes (Signed)
 PHARMACY - ANTICOAGULATION CONSULT NOTE  Pharmacy Consult for Heparin  infusion  Indication: atrial fibrillation  Allergies  Allergen Reactions   Celecoxib Hives   Dorzolamide Shortness Of Breath    Other Reaction(s): Other (See Comments)  Pt stated had reactions but unable to provide information   Penicillins Other (See Comments), Hives and Rash    Redness (sunburn type rash with peeling)  Has patient had a PCN reaction causing immediate rash, facial/tongue/throat swelling, SOB or lightheadedness with hypotension: No  Has patient had a PCN reaction causing severe rash involving mucus membranes or skin necrosis: No  Has patient had a PCN reaction that required hospitalization No  Has patient had a PCN reaction occurring within the last 10 years: Yes  If all of the above answers are NO, then may proceed with Cephalosporin use.  Other Reaction(s): Other (See Comments)  Redness (sunburn type rash with peeling)  Has patient had a PCN reaction causing immediate rash, facial/tongue/throat swelling, SOB or lightheadedness with hypotension: No  Has patient had a PCN reaction causing severe rash involving mucus membranes or skin necrosis: No  Has patient had a PCN reaction that required hospitalization No  Has patient had a PCN reaction occurring within the last 10 years: Yes  If all of the above answers are NO, then may proceed with Cephalosporin use.  Redness and flushing  Redness and flushing  Redness (sunburn type rash with peeling)   Shellfish Allergy Hives and Itching    All seafood   Timolol  Maleate Other (See Comments) and Shortness Of Breath    Redness  Other Reaction(s): Other (See Comments)  Shortness of breath  Redness  Redness  Higher Doses  Shortness of breath    Redness   Sulfa Antibiotics Other (See Comments)    Redness in eyes   Amoxicillin Rash   Amoxicillin-Pot Clavulanate Other (See Comments)    Redness and flushing   Brimonidine Other (See  Comments)    blisters   Cefuroxime Axetil Other (See Comments)    redness   Dipivefrin Other (See Comments)    sleepiness   Oxycodone      BP dropped  Other Reaction(s): Other (See Comments)  Dropped BP down too low  Dropped blood pressure   BP dropped  Dropped BP down too low    Patient Measurements: Height: 5' 4 (162.6 cm) Weight: 74.3 kg (163 lb 12.8 oz) IBW/kg (Calculated) : 54.7 HEPARIN  DW (KG): 70.2  Vital Signs: Temp: 97.7 F (36.5 C) (10/10 0956) Temp Source: Oral (10/10 0956) BP: 88/55 (10/10 0956) Pulse Rate: 105 (10/10 0956)  Labs: Recent Labs    08/03/24 0330 08/04/24 0504  HGB 13.0  --   HCT 38.9  --   PLT 167  --   CREATININE 0.82 0.83    Estimated Creatinine Clearance: 46.2 mL/min (by C-G formula based on SCr of 0.83 mg/dL).   Medical History: Past Medical History:  Diagnosis Date   Arthritis    Atrial fibrillation (HCC)    History of kidney stones    Hypothyroidism    Myocardial infarct (HCC)    PONV (postoperative nausea and vomiting)     Medications:  Medications Prior to Admission  Medication Sig Dispense Refill Last Dose/Taking   acetaminophen  (TYLENOL ) 650 MG CR tablet Take 650 mg by mouth daily as needed for pain.   Taking As Needed   amiodarone  (PACERONE ) 200 MG tablet Take 2 tablets (400 mg total) by mouth 2 (two) times daily for 10 days, THEN 1  tablet (200 mg total) daily. (Patient taking differently:  (200 mg total) daily.) 70 tablet 0 07/30/2024 Morning   apixaban  (ELIQUIS ) 5 MG TABS tablet Take 1 tablet (5 mg total) by mouth 2 (two) times daily. 180 tablet 3 07/30/2024 Morning   atorvastatin  (LIPITOR ) 40 MG tablet Take 40 mg by mouth at bedtime.   07/29/2024 Bedtime   azelastine  (ASTELIN ) 0.1 % nasal spray Place 1 spray into both nostrils daily.   07/30/2024 Morning   bimatoprost (LUMIGAN) 0.03 % ophthalmic solution Place 1 drop into the right eye at bedtime.   07/29/2024 Bedtime   Calcium  Carb-Cholecalciferol  (CALCIUM   CARBONATE-VITAMIN D3) 600-400 MG-UNIT TABS Take 1 tablet by mouth daily.   07/30/2024 Morning   Carboxymethylcellulose Sodium (THERATEARS) 0.25 % SOLN Place 1 drop into both eyes daily as needed (Dry eyes).   Taking As Needed   Cholecalciferol  (VITAMIN D ) 50 MCG (2000 UT) tablet Take 2,000 Units by mouth daily.   07/30/2024 Morning   Docusate Sodium  100 MG capsule Take 100 mg by mouth daily as needed for mild constipation or moderate constipation.   Taking As Needed   fexofenadine (ALLEGRA) 180 MG tablet Take 180 mg by mouth at bedtime.   07/29/2024 Bedtime   folic acid (FOLVITE) 400 MCG tablet Take 400 mcg by mouth daily.   Taking   ipratropium (ATROVENT) 0.06 % nasal spray Place 1 spray into both nostrils daily as needed for rhinitis.   Taking As Needed   isosorbide  mononitrate (IMDUR ) 30 MG 24 hr tablet Take 1 tablet (30 mg total) by mouth daily. 30 tablet 2 07/30/2024 Morning   levothyroxine  (SYNTHROID , LEVOTHROID) 100 MCG tablet Take 100 mcg by mouth daily before breakfast.   07/30/2024 Morning   lisinopril  (ZESTRIL ) 5 MG tablet Take 1 tablet (5 mg total) by mouth daily. 30 tablet 2 07/30/2024 Morning   LUMIGAN 0.01 % SOLN Place 1 drop into the right eye at bedtime.   Taking   metoprolol  tartrate (LOPRESSOR ) 25 MG tablet Take 0.5 tablets (12.5 mg total) by mouth daily as needed. for heart rate over 100.  Home med. 10 tablet 1 Unknown   Multiple Vitamin (MULTIVITAMIN WITH MINERALS) TABS tablet Take 1 tablet by mouth daily.   07/30/2024 Morning   Multiple Vitamins-Minerals (PRESERVISION AREDS 2 PO) Take 2 tablets by mouth 2 (two) times daily.   Taking   pantoprazole  (PROTONIX ) 40 MG tablet Take 40 mg by mouth daily.   07/30/2024 Morning   prednisoLONE acetate (PRED FORTE) 1 % ophthalmic suspension Place 1 drop into the right eye 4 (four) times daily.   Taking   timolol  (TIMOPTIC ) 0.5 % ophthalmic solution Place 1 drop into the right eye in the morning.   07/30/2024 Morning   vitamin B-12 (CYANOCOBALAMIN )  100 MCG tablet Take 100 mcg by mouth daily.   07/30/2024 Morning   furosemide  (LASIX ) 20 MG tablet Take 20 mg by mouth daily. prn (Patient not taking: Reported on 07/21/2024)       Assessment: 89 yo female with PMH of A. Fib, HTN, HLD, NSTEMI, and CAD admitted with small bowel obstruction s/p exploratory laparotomy and resection on 10/9. Patient remains NPO, TPN starting 10/10. Pharmacy has been consulted for heparin  infusion monitoring and dosing for A. Fib. Last apixaban  dose reported 10/5.   Goal of Therapy:  Heparin  level 0.3-0.7 units/ml Monitor platelets by anticoagulation protocol: Yes   Plan:  Baseline labs ordered No bolus per surgery. Start heparin  infusion at 1000 units/hr after PICC placement Check anti-Xa  level in 8 hours and daily while on heparin  Continue to monitor H&H and platelets  Estill CHRISTELLA Lutes, PharmD, BCPS Clinical Pharmacist 08/04/2024 12:42 PM

## 2024-08-05 DIAGNOSIS — Z950 Presence of cardiac pacemaker: Secondary | ICD-10-CM | POA: Diagnosis not present

## 2024-08-05 DIAGNOSIS — K56609 Unspecified intestinal obstruction, unspecified as to partial versus complete obstruction: Secondary | ICD-10-CM | POA: Diagnosis not present

## 2024-08-05 DIAGNOSIS — Z789 Other specified health status: Secondary | ICD-10-CM

## 2024-08-05 DIAGNOSIS — Z515 Encounter for palliative care: Secondary | ICD-10-CM | POA: Diagnosis not present

## 2024-08-05 DIAGNOSIS — Z7189 Other specified counseling: Secondary | ICD-10-CM

## 2024-08-05 DIAGNOSIS — I480 Paroxysmal atrial fibrillation: Secondary | ICD-10-CM | POA: Diagnosis not present

## 2024-08-05 LAB — BASIC METABOLIC PANEL WITH GFR
Anion gap: 11 (ref 5–15)
BUN: 31 mg/dL — ABNORMAL HIGH (ref 8–23)
CO2: 19 mmol/L — ABNORMAL LOW (ref 22–32)
Calcium: 8.6 mg/dL — ABNORMAL LOW (ref 8.9–10.3)
Chloride: 102 mmol/L (ref 98–111)
Creatinine, Ser: 0.78 mg/dL (ref 0.44–1.00)
GFR, Estimated: >60 mL/min (ref >60)
Glucose, Bld: 119 mg/dL — ABNORMAL HIGH (ref 70–99)
Potassium: 4 mmol/L (ref 3.5–5.1)
Sodium: 132 mmol/L — ABNORMAL LOW (ref 135–145)

## 2024-08-05 LAB — CBC
HCT: 35.2 % — ABNORMAL LOW (ref 36.0–46.0)
Hemoglobin: 11.9 g/dL — ABNORMAL LOW (ref 12.0–15.0)
MCH: 30.4 pg (ref 26.0–34.0)
MCHC: 33.8 g/dL (ref 30.0–36.0)
MCV: 90 fL (ref 80.0–100.0)
Platelets: 137 K/uL — ABNORMAL LOW (ref 150–400)
RBC: 3.91 MIL/uL (ref 3.87–5.11)
RDW: 13.2 % (ref 11.5–15.5)
WBC: 14.7 K/uL — ABNORMAL HIGH (ref 4.0–10.5)
nRBC: 0 % (ref 0.0–0.2)

## 2024-08-05 LAB — MAGNESIUM: Magnesium: 2.2 mg/dL (ref 1.7–2.4)

## 2024-08-05 LAB — GLUCOSE, CAPILLARY
Glucose-Capillary: 109 mg/dL — ABNORMAL HIGH (ref 70–99)
Glucose-Capillary: 117 mg/dL — ABNORMAL HIGH (ref 70–99)
Glucose-Capillary: 133 mg/dL — ABNORMAL HIGH (ref 70–99)
Glucose-Capillary: 131 mg/dL — ABNORMAL HIGH (ref 70–99)

## 2024-08-05 LAB — PHOSPHORUS: Phosphorus: 2.2 mg/dL — ABNORMAL LOW (ref 2.5–4.6)

## 2024-08-05 MED ORDER — HEPARIN (PORCINE) 25000 UT/250ML-% IV SOLN
1300.0000 [IU]/h | INTRAVENOUS | Status: DC
  Administered 2024-08-05: 1000 [IU]/h via INTRAVENOUS
  Administered 2024-08-06: 1150 [IU]/h via INTRAVENOUS
  Filled 2024-08-05 (×2): qty 250

## 2024-08-05 MED ORDER — TRAVASOL 10 % IV SOLN
INTRAVENOUS | Status: AC
  Filled 2024-08-05: qty 420

## 2024-08-05 MED ORDER — PHENOL 1.4 % MT LIQD
1.0000 | OROMUCOSAL | Status: DC | PRN
  Administered 2024-08-05: 1 via OROMUCOSAL
  Filled 2024-08-05: qty 177

## 2024-08-05 MED ORDER — POTASSIUM PHOSPHATES 15 MMOLE/5ML IV SOLN
30.0000 mmol | Freq: Once | INTRAVENOUS | Status: AC
  Administered 2024-08-05: 30 mmol via INTRAVENOUS
  Filled 2024-08-05: qty 10

## 2024-08-05 NOTE — Progress Notes (Signed)
 [facility]  SUBJECTIVE: Patient sitting on side of the bed, receiving physical therapy   Vitals:   08/04/24 2315 08/05/24 0308 08/05/24 0500 08/05/24 0749  BP: 104/62 99/88  90/61  Pulse: (!) 107 64  95  Resp: 17 17  16   Temp: 98.2 F (36.8 C) (!) 97.2 F (36.2 C)  97.9 F (36.6 C)  TempSrc:      SpO2: 96% 95%  95%  Weight:   74.9 kg   Height:         Intake/Output Summary (Last 24 hours) at 08/05/2024 0950 Last data filed at 08/05/2024 0831 Gross per 24 hour  Intake 734.01 ml  Output 1575 ml  Net -840.99 ml      PHYSICAL EXAM  General: Well developed, well nourished, in no acute distress HEENT:  Normocephalic and atramatic Neck:  No JVD.  Lungs: Clear bilaterally to auscultation and percussion. Heart: HRRR . Normal S1 and S2 without gallops or murmurs.  Abdomen: Bowel sounds are positive, abdomen soft and non-tender  Msk:  Back normal, normal gait. Normal strength and tone for age. Extremities: No clubbing, cyanosis or edema.   Neuro: Alert and oriented X 3. Psych:  Good affect, responds appropriately   LABS: Basic Metabolic Panel: Recent Labs    08/04/24 0504 08/05/24 0351  NA 132* 132*  K 3.8 4.0  CL 103 102  CO2 22 19*  GLUCOSE 112* 119*  BUN 32* 31*  CREATININE 0.83 0.78  CALCIUM  8.7* 8.6*  MG 1.8 2.2  PHOS 3.0 2.2*   Liver Function Tests: No results for input(s): AST, ALT, ALKPHOS, BILITOT, PROT, ALBUMIN in the last 72 hours. No results for input(s): LIPASE, AMYLASE in the last 72 hours. CBC: Recent Labs    08/03/24 0330 08/05/24 0351  WBC 8.7 14.7*  HGB 13.0 11.9*  HCT 38.9 35.2*  MCV 90.5 90.0  PLT 167 137*   Cardiac Enzymes: No results for input(s): CKTOTAL, CKMB, CKMBINDEX, TROPONINI in the last 72 hours. BNP: Invalid input(s): POCBNP D-Dimer: No results for input(s): DDIMER in the last 72 hours. Hemoglobin A1C: Recent Labs    08/04/24 0504  HGBA1C 5.1   Fasting Lipid Panel: No results  for input(s): CHOL, HDL, LDLCALC, TRIG, CHOLHDL, LDLDIRECT in the last 72 hours. Thyroid  Function Tests: No results for input(s): TSH, T4TOTAL, T3FREE, THYROIDAB in the last 72 hours.  Invalid input(s): FREET3 Anemia Panel: No results for input(s): VITAMINB12, FOLATE, FERRITIN, TIBC, IRON, RETICCTPCT in the last 72 hours.  IR PICC PLACEMENT RIGHT >5 YRS INC IMG GUIDE Result Date: 08/04/2024 INDICATION: 89 year old female with recently placed left subclavian approach cardiac rhythm maintenance device. Unfortunately, she requires TPN and therefore IR is consulted for PICC placement. EXAM: PICC LINE PLACEMENT WITH ULTRASOUND AND FLUOROSCOPIC GUIDANCE MEDICATIONS: None. ANESTHESIA/SEDATION: None. FLUOROSCOPY TIME:  Radiation exposure index: 2.9 mGy, air kerma COMPLICATIONS: SIR Level A - No therapy, no consequence. Inadvertent arterial puncture by the APP initially performing the procedure. The complication was recognized immediately and pressure held for 25 minutes. No evidence of hematoma. Physician then took over and completed placement of PICC. PROCEDURE: The patient was advised of the possible risks and complications and agreed to undergo the procedure. The patient was then brought to the angiographic suite for the procedure. The right arm was prepped with chlorhexidine , draped in the usual sterile fashion using maximum barrier technique (cap and mask, sterile gown, sterile gloves, large sterile sheet, hand hygiene and cutaneous antisepsis) and infiltrated locally with 1% Lidocaine . Ultrasound demonstrated  patency of the right brachial vein, and this was documented with an image. Under real-time ultrasound guidance, this vein was accessed with a 21 gauge micropuncture needle and image documentation was performed. A 0.018 wire was introduced in to the vein. Over this, a 5 [place] dual lumen power-injectable PICC was advanced to the lower SVC. Fluoroscopy during the  procedure and fluoro spot radiograph confirms appropriate catheter position. The catheter was flushed and covered with a sterile dressing. Catheter length: 32 cm IMPRESSION: Successful right arm Power PICC line placement with ultrasound and fluoroscopic guidance. The catheter is ready for use. Electronically Signed   By: Wilkie Lent M.D.   On: 08/04/2024 16:44   US  EKG SITE RITE Result Date: 08/03/2024 If Site Rite image not attached, placement could not be confirmed due to current cardiac rhythm.    Echo LVEF 55-60% 11/03/2020  TELEMETRY: Atrial fibrillation 118 bpm:  ASSESSMENT AND PLAN:  Principal Problem:   SBO (small bowel obstruction) (HCC) Active Problems:   HTN (hypertension)   Acquired hypothyroidism   Coronary artery disease with history of NSTEMI   Status cardiac pacemaker 07/24/2024 for sinus pause with syncope   Permanent atrial fibrillation (HCC)   Chronic anticoagulation   Small bowel obstruction (HCC)    1.  Persistent atrial fibrillation with rapid ventricular rate, on IV amiodarone , and metoprolol  to tartrate 2.5 mg IV as needed 2.  Small bowel obstruction, status post exploratory laparotomy and reduction of internal hernia, NG tube in place 3.  Status post dual-chamber pacemaker [facility] [employer] [address] DR EL) 07/24/2024 4.  History of NSTEMI 11/03/2020 with occluded RPL 3, treated medically 5.  Essential hypertension, blood pressure currently low  Recommendations  1.  Agree with current therapy 2.  Continue amiodarone  drip for rate control, transition to amiodarone  200 mg daily when able to take p.o. medications 3.  Continue heparin  drip, transition to Eliquis  when able to take p.o. medications   Marsa Dooms, MD, PhD, FACC 08/05/2024 9:50 AM

## 2024-08-05 NOTE — Consult Note (Signed)
 Consultation Note Date: 08/05/2024   Patient Name: Maria Trujillo  DOB: 08-14-1934  MRN: [id number]  Age / Sex: 89 y.o., female  PCP: Jeffie Cheryl BRAVO, MD Referring Physician: Jens Durand, MD  Reason for Consultation: Establishing goals of care   HPI/Brief Hospital Course: 89 y.o. female  with past medical history of CAD, history of NSTEMI, hypertension, hyperlipidemia, A-fib on Eliquis  and amiodarone , complications status post cardioversion with sinus pauses and significant bradycardia with syncope in July 2025 now s/p PPM on 07/24/2024 admitted from home on 07/30/2024 with significant abdominal pain with associated nausea and vomiting.  In ED found to have small bowel obstruction with suspected transition point in the right lower quadrant and mesenteric edema and free fluid  Admitted for small bowel obstruction, general surgery consulted, NG tube placed to suction, s/p exploratory lap with small bowel resection and hernia repair on 10/9 NG tube remains and has been started on TPN via PICC line  Palliative medicine was consulted for assisting with goals of care conversations.  Subjective:  Extensive chart review has been completed prior to meeting patient including labs, vital signs, imaging, progress notes, orders, and available advanced directive documents from current and previous encounters.  Visited with [name] at her bedside.  She is awake, alert and able to engage in conversations.  Daughter-Tracy at bedside during time of visit.  Introduced myself as a Publishing rights manager as a member of the palliative care team. Explained palliative medicine is specialized medical care for people living with serious illness. It focuses on providing relief from the symptoms and stress of a serious illness. The goal is to improve quality of life for both the patient and the family.   [name] provides a brief life review.  She is currently widowed.  She has 2  children [name] her daughter and [name] her son.  [name] is established HCPOA.  [name] shares advance directive documents were provided to secretary yesterday and copies were made.  Advance directive not available in [place] for review will obtain copy and scan into record.  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  Prior to admission [name] lived at home alone and functioned independently.  She does not require assistance with completing ADLs at baseline.  She remains active and enjoys socializing on occasion.  Ms. Lundy able to clearly explain her understanding of current medical situation.  Aware of small bowel obstruction that required surgical intervention.  Aware she is receiving artificial nutritional support via PICC line which will remain until return of bowel function.  Assessed symptoms.  She denies acute pain, reports mild discomfort at surgical site as expected.  [name] and daughter are hopeful she will be able to mobilize around her room later today.  Daughter shares she currently works as a Careers adviser and has a good understanding of Ms. spoons condition.  We discussed patient's current illness and what it means in the larger context of patient's on-going co-morbidities.   Attempted to elicit goals of care.  We discussed CODE STATUS and the difference between full code and DO NOT RESUSCITATE.  [name] and [name] confirm full CODE STATUS at this time.  They share plan is for ongoing recovery likely to discharge to short-term rehab within goal of returning home and functioning independently.  I discussed importance of continued conversations with family/support persons and all members of their medical team regarding overall plan of care and  treatment options ensuring decisions are in alignment with patients goals of care.  All questions/concerns addressed. Emotional support provided to patient/family/support  persons. PMT will continue to follow and support patient as needed.  Objective: Primary Diagnoses: Present on Admission:  SBO (small bowel obstruction) (HCC)  HTN (hypertension)  Acquired hypothyroidism  Small bowel obstruction (HCC)   Physical Exam Constitutional:      General: She is not in acute distress.    Appearance: She is ill-appearing.  HENT:     Head: Normocephalic.  Pulmonary:     Effort: Pulmonary effort is normal. No respiratory distress.  Abdominal:     Palpations: Abdomen is soft.     Tenderness: There is no abdominal tenderness.  Skin:    General: Skin is warm and dry.  Neurological:     Mental Status: She is alert and oriented to person, place, and time.     Motor: Weakness present.  Psychiatric:        Mood and Affect: Mood normal.        Behavior: Behavior normal.        Thought Content: Thought content normal.     Vital Signs: BP (!) 90/49 (BP Location: Right Arm)   Pulse 96   Temp 98 F (36.7 C) (Axillary)   Resp 19   Ht 5' 4 (1.626 m)   Wt 74.9 kg   SpO2 98%   BMI 28.34 kg/m  Pain Scale: 0-10 POSS *See Group Information*: 1-Acceptable,Awake and alert Pain Score: Asleep  IO: Intake/output summary:  Intake/Output Summary (Last 24 hours) at 08/05/2024 1542 Last data filed at 08/05/2024 1500 Gross per 24 hour  Intake 1301.65 ml  Output 1525 ml  Net -223.35 ml    LBM: Last BM Date : 07/30/24 Baseline Weight: Weight: 74.4 kg Most recent weight: Weight: 74.9 kg       Palliative Assessment/Data: 90%   Assessment and Plan  SUMMARY OF RECOMMENDATIONS   Full code/full scope Continue with current plan of care PMT to continue to provide support  Palliative Prophylaxis:   Bowel Regimen, Delirium Protocol and Frequent Pain Assessment  Thank you for this consult and allowing Palliative Medicine to participate in the care of [name]. Palliative medicine will continue to follow and assist as needed.   Visit includes: Detailed  review of medical records (labs, imaging, vital signs), medically appropriate exam (mental status, respiratory, cardiac, skin), discussed with treatment team, counseling and educating patient, family and staff, documenting clinical information, medication management and coordination of care.   Signed by: Waddell Lesches, DNP, AGNP-C Palliative Medicine    Please contact Palliative Medicine Team phone at [phone] for questions and concerns.  For individual provider: See Tracey

## 2024-08-05 NOTE — Progress Notes (Signed)
 PHARMACY - ANTICOAGULATION CONSULT NOTE  Pharmacy Consult for Heparin  infusion  Indication: atrial fibrillation  Allergies  Allergen Reactions   Celecoxib Hives   Dorzolamide Shortness Of Breath    Other Reaction(s): Other (See Comments)  Pt stated had reactions but unable to provide information   Penicillins Other (See Comments), Hives and Rash    Redness (sunburn type rash with peeling)  Has patient had a PCN reaction causing immediate rash, facial/tongue/throat swelling, SOB or lightheadedness with hypotension: No  Has patient had a PCN reaction causing severe rash involving mucus membranes or skin necrosis: No  Has patient had a PCN reaction that required hospitalization No  Has patient had a PCN reaction occurring within the last 10 years: Yes  If all of the above answers are NO, then may proceed with Cephalosporin use.  Other Reaction(s): Other (See Comments)  Redness (sunburn type rash with peeling)  Has patient had a PCN reaction causing immediate rash, facial/tongue/throat swelling, SOB or lightheadedness with hypotension: No  Has patient had a PCN reaction causing severe rash involving mucus membranes or skin necrosis: No  Has patient had a PCN reaction that required hospitalization No  Has patient had a PCN reaction occurring within the last 10 years: Yes  If all of the above answers are NO, then may proceed with Cephalosporin use.  Redness and flushing  Redness and flushing  Redness (sunburn type rash with peeling)   Shellfish Allergy Hives and Itching    All seafood   Timolol  Maleate Other (See Comments) and Shortness Of Breath    Redness  Other Reaction(s): Other (See Comments)  Shortness of breath  Redness  Redness  Higher Doses  Shortness of breath    Redness   Sulfa Antibiotics Other (See Comments)    Redness in eyes   Amoxicillin Rash   Amoxicillin-Pot Clavulanate Other (See Comments)    Redness and flushing   Brimonidine Other (See  Comments)    blisters   Cefuroxime Axetil Other (See Comments)    redness   Dipivefrin Other (See Comments)    sleepiness   Oxycodone      BP dropped  Other Reaction(s): Other (See Comments)  Dropped BP down too low  Dropped blood pressure   BP dropped  Dropped BP down too low    Patient Measurements: Height: 5' 4 (162.6 cm) Weight: 74.9 kg (165 lb 2 oz) IBW/kg (Calculated) : 54.7 HEPARIN  DW (KG): 70.2  Vital Signs: Temp: 97.2 F (36.2 C) (10/11 0308) BP: 99/88 (10/11 0308) Pulse Rate: 64 (10/11 0308)  Labs: Recent Labs    08/03/24 0330 08/04/24 0504 08/04/24 1328 08/05/24 0351  HGB 13.0  --   --  11.9*  HCT 38.9  --   --  35.2*  PLT 167  --   --  137*  APTT  --   --  40*  --   LABPROT  --   --  17.8*  --   INR  --   --  1.4*  --   HEPARINUNFRC  --   --  <0.10*  --   CREATININE 0.82 0.83  --  0.78    Estimated Creatinine Clearance: 48.2 mL/min (by C-G formula based on SCr of 0.78 mg/dL).   Medical History: Past Medical History:  Diagnosis Date   Arthritis    Atrial fibrillation (HCC)    History of kidney stones    Hypothyroidism    Myocardial infarct (HCC)    PONV (postoperative nausea and vomiting)  Medications:  Medications Prior to Admission  Medication Sig Dispense Refill Last Dose/Taking   acetaminophen  (TYLENOL ) 650 MG CR tablet Take 650 mg by mouth daily as needed for pain.   Taking As Needed   amiodarone  (PACERONE ) 200 MG tablet Take 2 tablets (400 mg total) by mouth 2 (two) times daily for 10 days, THEN 1 tablet (200 mg total) daily. (Patient taking differently:  (200 mg total) daily.) 70 tablet 0 07/30/2024 Morning   apixaban  (ELIQUIS ) 5 MG TABS tablet Take 1 tablet (5 mg total) by mouth 2 (two) times daily. 180 tablet 3 07/30/2024 Morning   atorvastatin  (LIPITOR ) 40 MG tablet Take 40 mg by mouth at bedtime.   07/29/2024 Bedtime   azelastine  (ASTELIN ) 0.1 % nasal spray Place 1 spray into both nostrils daily.   07/30/2024 Morning    bimatoprost (LUMIGAN) 0.03 % ophthalmic solution Place 1 drop into the right eye at bedtime.   07/29/2024 Bedtime   Calcium  Carb-Cholecalciferol  (CALCIUM  CARBONATE-VITAMIN D3) 600-400 MG-UNIT TABS Take 1 tablet by mouth daily.   07/30/2024 Morning   Carboxymethylcellulose Sodium (THERATEARS) 0.25 % SOLN Place 1 drop into both eyes daily as needed (Dry eyes).   Taking As Needed   Cholecalciferol  (VITAMIN D ) 50 MCG (2000 UT) tablet Take 2,000 Units by mouth daily.   07/30/2024 Morning   Docusate Sodium  100 MG capsule Take 100 mg by mouth daily as needed for mild constipation or moderate constipation.   Taking As Needed   fexofenadine (ALLEGRA) 180 MG tablet Take 180 mg by mouth at bedtime.   07/29/2024 Bedtime   folic acid (FOLVITE) 400 MCG tablet Take 400 mcg by mouth daily.   Taking   ipratropium (ATROVENT) 0.06 % nasal spray Place 1 spray into both nostrils daily as needed for rhinitis.   Taking As Needed   isosorbide  mononitrate (IMDUR ) 30 MG 24 hr tablet Take 1 tablet (30 mg total) by mouth daily. 30 tablet 2 07/30/2024 Morning   levothyroxine  (SYNTHROID , LEVOTHROID) 100 MCG tablet Take 100 mcg by mouth daily before breakfast.   07/30/2024 Morning   lisinopril  (ZESTRIL ) 5 MG tablet Take 1 tablet (5 mg total) by mouth daily. 30 tablet 2 07/30/2024 Morning   LUMIGAN 0.01 % SOLN Place 1 drop into the right eye at bedtime.   Taking   metoprolol  tartrate (LOPRESSOR ) 25 MG tablet Take 0.5 tablets (12.5 mg total) by mouth daily as needed. for heart rate over 100.  Home med. 10 tablet 1 Unknown   Multiple Vitamin (MULTIVITAMIN WITH MINERALS) TABS tablet Take 1 tablet by mouth daily.   07/30/2024 Morning   Multiple Vitamins-Minerals (PRESERVISION AREDS 2 PO) Take 2 tablets by mouth 2 (two) times daily.   Taking   pantoprazole  (PROTONIX ) 40 MG tablet Take 40 mg by mouth daily.   07/30/2024 Morning   prednisoLONE acetate (PRED FORTE) 1 % ophthalmic suspension Place 1 drop into the right eye 4 (four) times daily.    Taking   timolol  (TIMOPTIC ) 0.5 % ophthalmic solution Place 1 drop into the right eye in the morning.   07/30/2024 Morning   vitamin B-12 (CYANOCOBALAMIN ) 100 MCG tablet Take 100 mcg by mouth daily.   07/30/2024 Morning   furosemide  (LASIX ) 20 MG tablet Take 20 mg by mouth daily. prn (Patient not taking: Reported on 07/21/2024)       Assessment: 89 yo female with PMH of A. Fib, HTN, HLD, NSTEMI, and CAD admitted with small bowel obstruction s/p exploratory laparotomy and resection on 10/9. Patient remains  NPO, TPN starting 10/10. Pharmacy has been consulted for heparin  infusion monitoring and dosing for A. Fib. Last apixaban  dose reported 10/5.  Update 10/10 following PICC placement -- brachial artery was struck, hemostasis now achieved. Heparin  will now be restarted 12 hours after procedure.  Goal of Therapy:  Heparin  level 0.3-0.7 units/ml Monitor platelets by anticoagulation protocol: Yes   Plan:  Restart heparin  infusion at 1000 units/hr at 0500 on 10/11  10/11:   - unclear when MD wants to resume heparin .  Previous note says to wait 12 hrs but pt family states they were told to wait 24hrs.  Open AI and Gemini recommend waiting 24 hrs after significant bleeding  - heparin  currently on hold til 1700, will defer to MD if heparin  needs to start earlier Continue to monitor H&H and platelets  Duncan Alejandro D Clinical Pharmacist 08/05/2024 6:57 AM

## 2024-08-05 NOTE — Plan of Care (Signed)
  Problem: Education: Goal: Knowledge of General Education information will improve Description: Including pain rating scale, medication(s)/side effects and non-pharmacologic comfort measures Outcome: Progressing   Problem: Health Behavior/Discharge Planning: Goal: Ability to manage health-related needs will improve Outcome: Progressing   Problem: Clinical Measurements: Goal: Ability to maintain clinical measurements within normal limits will improve Outcome: Progressing   Problem: Activity: Goal: Risk for activity intolerance will decrease Outcome: Progressing   Problem: Elimination: Goal: Will not experience complications related to bowel motility Outcome: Progressing   

## 2024-08-05 NOTE — Progress Notes (Signed)
 PHARMACY - TOTAL PARENTERAL NUTRITION CONSULT NOTE   Indication: Small bowel obstruction  Patient Measurements: Height: 5' 4 (162.6 cm) Weight: 74.9 kg (165 lb 2 oz) IBW/kg (Calculated) : 54.7 TPN AdjBW (KG): 59.6 Body mass index is 28.34 kg/m. Usual Weight: UNK  Assessment: 89yo female admitted with intractable vomiting and small bowel obstruction. Patient has PMH cholecystectomy, CAD, NSTEMI, HTN, HLD, A. Fib, and recent pacemaker. She has been NPO/clear liquids since admission. Patient reports Hives allergy to Shellfish. Allergy was confirmed with family to be to ALL seafood.   Glucose / Insulin: BG 90-140s > 100-110 Electrolytes: WNL, but high risk for refeeding  Renal: Scr <1 Hepatic: LFTs WNL Intake / Output; MIVF: D5/1/2NS w/KCL @ 100 ml/hr - Stop time 10/9 @ 1714 GI Imaging:  10/8 CT/ABDOMEN PELVIS Complex small bowel obstruction with 2 points of obstruction  GI Surgeries / Procedures: S/P exploratory laparotomy, small bowel resection, reduction of internal hernia, and repair or ventral hernia on 10/9  Central access: PICC order Placed 10/10 AM TPN start date: 08/04/2024  Nutritional Goals: Goal TPN rate is 70 mL/hr (provides 84g of protein and 1696 kcals per day)  RD Assessment: Estimated Needs Total Energy Estimated Needs: 1600-1800kcal/day Total Protein Estimated Needs: 80-90g/day Total Fluid Estimated Needs: 1.4-1.6L/day  Current Nutrition:  NPO,   Plan:  Continue TPN at 49mL/hr (1/2 rate) at 1800. Risk of refeeding.  - will give Kphos 30 mmol IV x 1 outside of the bag.  Electrolytes in TPN: Na 54mEq/L, K 49mEq/L, Ca 66mEq/L, Mg 49mEq/L, and Phos 15mmol/L. Cl:Ac 1:1 Due to allergy to ALL Seafoods- INTRALIPIDS used instead of SMOFLIPIDS Add standard MVI and trace elements to TPN Thiamine 100mg  IV x 7 days ordered (Day2)  Initiate Sensitive q6h SSI and adjust as needed  Monitor TPN labs on Mon/Thurs. BMP, Phos, and Mag monitored daily until stable.    Cathaleen GORMAN Blanch, PharmD, BCPS Clinical Pharmacist 08/05/2024 9:42 AM

## 2024-08-05 NOTE — Progress Notes (Addendum)
 Progress Note    Maria Trujillo  FMW:[id number] DOB: 16-Jun-1934  DOA: 07/30/2024 PCP: Jeffie Cheryl BRAVO, MD      Brief Narrative:    Medical records reviewed and are as summarized below:  Maria Trujillo is a 89 y.o. female with medical history significant for CAD with prior NSTEMI, HTN, HLD, persistent A-fib on Eliquis  and amiodarone , complicated by post conversion pauses/bradycardia with syncope 04/2024 s/p pacemaker on 07/24/2024 -(Eliquis  held 9/26 to 9/30).  She presented to the hospital because of vomiting and abdominal pain.  She was admitted to the hospital for small bowel obstruction.  NG tube was placed and general surgery was consulted.  NG tube was placed for gastric decompression.  NG tube was subsequently removed and patient was started on clear liquid diet.  However, she failed Gastrografin challenge.  She was taken to the OR for exploratory laparotomy with reduction of internal hernia and resection of small bowel with primary anastomosis and lysis of adhesion on 08/03/2024.   Assessment/Plan:   Principal Problem:   SBO (small bowel obstruction) (HCC) Active Problems:   Coronary artery disease with history of NSTEMI   Status cardiac pacemaker 07/24/2024 for sinus pause with syncope   Paroxysmal atrial fibrillation (HCC)   Chronic anticoagulation   HTN (hypertension)   Acquired hypothyroidism   Small bowel obstruction (HCC)   Nutrition Problem: Inadequate oral intake Etiology: altered GI function  Signs/Symptoms: NPO status   Body mass index is 28.34 kg/m.   Small bowel obstruction: Failed conservative management.  S/p ex lap, reduction of internal hernia, small bowel resection with primary anastomosis and repair of ventral hernia on 08/03/2024. She is NPO.  She is on TPN for nutrition.  Analgesics as needed for pain.   Paroxysmal atrial fibrillation with RVR: Continue IV amiodarone  drip.  Plan to restart IV heparin  drip later today.  Monitor heparin  level  per protocol.  Eliquis  on hold.   CAD with previous NSTEMI, s/p cardiac pacemaker on 07/24/2024 for sinus pause with syncope   Mild hyponatremia: Asymptomatic.  Monitor BMP   Comorbidities include hypertension, hypothyroidism   Diet Order             Diet NPO time specified Except for: Ice Chips  Diet effective now                                  Consultants: Cardiologist General Surgeon  Procedures:  S/p ex lap, reduction of internal hernia, small bowel resection with primary anastomosis and repair of ventral hernia on 08/03/2024. PICC line placement in the right brachial vein on 08/04/2024 by IR    Medications:    Chlorhexidine  Gluconate Cloth  6 each Topical Q0600   insulin aspart  0-9 Units Subcutaneous Q6H   latanoprost   1 drop Right Eye QHS   levothyroxine   100 mcg Oral QAC breakfast   pantoprazole  (PROTONIX ) IV  40 mg Intravenous Q24H   thiamine (VITAMIN B1) injection  100 mg Intravenous Daily   timolol   1 drop Right Eye Daily   Continuous Infusions:  amiodarone  30 mg/hr (08/05/24 1022)   heparin      potassium PHOSPHATE IVPB (in mmol)     promethazine  (PHENERGAN ) injection (IM or IVPB) 12.5 mg (08/02/24 1556)   TPN ADULT (ION) 35 mL/hr at 08/04/24 1859   TPN ADULT (ION)       Anti-infectives (From admission, onward)    Start  Dose/Rate Route Frequency Ordered Stop   08/03/24 2000  ciprofloxacin (CIPRO) IVPB 400 mg        400 mg 200 mL/hr over 60 Minutes Intravenous Every 12 hours 08/03/24 1059 08/04/24 1100   08/03/24 2000  metroNIDAZOLE (FLAGYL) IVPB 500 mg        500 mg 100 mL/hr over 60 Minutes Intravenous Every 12 hours 08/03/24 1059 08/04/24 1240   08/03/24 0700  ciprofloxacin (CIPRO) IVPB 400 mg        400 mg 200 mL/hr over 60 Minutes Intravenous  Once 08/02/24 1713 08/03/24 0734   08/03/24 0700  metroNIDAZOLE (FLAGYL) IVPB 500 mg        500 mg 100 mL/hr over 60 Minutes Intravenous  Once 08/02/24 1713 08/03/24 0734    07/30/24 2000  levofloxacin (LEVAQUIN) tablet 250 mg  Status:  Discontinued        250 mg Oral  Once 07/30/24 1958 07/30/24 1959   07/30/24 2000  levofloxacin (LEVAQUIN) tablet 750 mg        750 mg Oral  Once 07/30/24 1959 07/30/24 2021              Family Communication/Anticipated D/C date and plan/Code Status   DVT prophylaxis:      Code Status: Full Code  Family Communication: Plan discussed with Dwayne, daughter, at the bedside Disposition Plan: Plan to discharge home   Status is: Inpatient Remains inpatient appropriate because: Ex lap for small bowel obstruction       Subjective:   Interval events noted.  She complains of abdominal pain.  No flatus or bowel movement.  Dwayne, daughter, at the bedside.  Mardel, RN, was at the bedside  Objective:    Vitals:   08/04/24 2315 08/05/24 0308 08/05/24 0500 08/05/24 0749  BP: 104/62 99/88  90/61  Pulse: (!) 107 64  95  Resp: 17 17  16   Temp: 98.2 F (36.8 C) (!) 97.2 F (36.2 C)  97.9 F (36.6 C)  TempSrc:      SpO2: 96% 95%  95%  Weight:   74.9 kg   Height:       No data found.   Intake/Output Summary (Last 24 hours) at 08/05/2024 1150 Last data filed at 08/05/2024 0831 Gross per 24 hour  Intake 734.01 ml  Output 1425 ml  Net -690.99 ml   Filed Weights   07/30/24 1756 08/03/24 1903 08/05/24 0500  Weight: 74.4 kg 74.3 kg 74.9 kg    Exam:  GEN: NAD SKIN: No rash EYES: No pallor or icterus ENT: MMM, NG tube in place CV: RRR PULM: CTA B ABD: soft, ND, mild surgical tenderness without rebound tenderness or guarding, +BS.  Surgical incisions clean, dry and intact with staples. CNS: AAO x 3, non focal EXT: No edema or tenderness        Data Reviewed:   I have personally reviewed following labs and imaging studies:  Labs: Labs show the following:   Basic Metabolic Panel: Recent Labs  Lab 07/30/24 1800 07/31/24 0415 08/01/24 0401 08/03/24 0330 08/04/24 0504 08/05/24 0351  NA  137 139 139 135 132* 132*  K 3.9 3.9 3.9 3.6 3.8 4.0  CL 103 109 108 102 103 102  CO2 21* 22 25 23 22  19*  GLUCOSE 141* 132* 97 113* 112* 119*  BUN 18 19 29* 33* 32* 31*  CREATININE 0.66 0.56 0.69 0.82 0.83 0.78  CALCIUM  9.8 9.6 8.9 8.4* 8.7* 8.6*  MG 2.2  --  2.1  2.1 1.8 2.2  PHOS  --   --   --  2.8 3.0 2.2*   GFR Estimated Creatinine Clearance: 48.2 mL/min (by C-G formula based on SCr of 0.78 mg/dL). Liver Function Tests: Recent Labs  Lab 07/30/24 1800  AST 24  ALT 18  ALKPHOS 89  BILITOT 1.1  PROT 7.1  ALBUMIN 3.7   Recent Labs  Lab 07/30/24 1800  LIPASE 31   No results for input(s): AMMONIA in the last 168 hours. Coagulation profile Recent Labs  Lab 08/04/24 1328  INR 1.4*    CBC: Recent Labs  Lab 07/30/24 1800 07/31/24 0415 08/01/24 0401 08/03/24 0330 08/05/24 0351  WBC 12.4* 11.3* 8.0 8.7 14.7*  NEUTROABS 10.9*  --   --   --   --   HGB 12.6 12.3 12.2 13.0 11.9*  HCT 39.1 36.5 36.4 38.9 35.2*  MCV 93.5 91.5 91.0 90.5 90.0  PLT 158 148* 142* 167 137*   Cardiac Enzymes: No results for input(s): CKTOTAL, CKMB, CKMBINDEX, TROPONINI in the last 168 hours. BNP (last 3 results) No results for input(s): PROBNP in the last 8760 hours. CBG: Recent Labs  Lab 08/04/24 0639 08/04/24 1146 08/04/24 1902 08/05/24 0638 08/05/24 0751  GLUCAP 97 110* 117* 109* 117*   D-Dimer: No results for input(s): DDIMER in the last 72 hours. Hgb A1c: Recent Labs    08/04/24 0504  HGBA1C 5.1   Lipid Profile: No results for input(s): CHOL, HDL, LDLCALC, TRIG, CHOLHDL, LDLDIRECT in the last 72 hours. Thyroid  function studies: No results for input(s): TSH, T4TOTAL, T3FREE, THYROIDAB in the last 72 hours.  Invalid input(s): FREET3 Anemia work up: No results for input(s): VITAMINB12, FOLATE, FERRITIN, TIBC, IRON, RETICCTPCT in the last 72 hours. Sepsis Labs: Recent Labs  Lab 07/31/24 0415 08/01/24 0401 08/03/24 0330  08/05/24 0351  WBC 11.3* 8.0 8.7 14.7*    Microbiology Recent Results (from the past 240 hours)  Resp panel by RT-PCR (RSV, Flu A&B, Covid) Anterior Nasal Swab     Status: None   Collection Time: 07/30/24  7:08 PM   Specimen: Anterior Nasal Swab  Result Value Ref Range Status   SARS Coronavirus 2 by RT PCR NEGATIVE NEGATIVE Final    Comment: (NOTE) SARS-CoV-2 target nucleic acids are NOT DETECTED.  The SARS-CoV-2 RNA is generally detectable in upper respiratory specimens during the acute phase of infection. The lowest concentration of SARS-CoV-2 viral copies this assay can detect is 138 copies/mL. A negative result does not preclude SARS-Cov-2 infection and should not be used as the sole basis for treatment or other patient management decisions. A negative result may occur with  improper specimen collection/handling, submission of specimen other than nasopharyngeal swab, presence of viral mutation(s) within the areas targeted by this assay, and inadequate number of viral copies(<138 copies/mL). A negative result must be combined with clinical observations, patient history, and epidemiological information. The expected result is Negative.  Fact Sheet for Patients:  BloggerCourse.com  Fact Sheet for Healthcare Providers:  SeriousBroker.it  This test is no t yet approved or cleared by the [place]  FDA and  has been authorized for detection and/or diagnosis of SARS-CoV-2 by FDA under an Emergency Use Authorization (EUA). This EUA will remain  in effect (meaning this test can be used) for the duration of the COVID-19 declaration under Section 564(b)(1) of the Act, 21 U.S.C.section 360bbb-3(b)(1), unless the authorization is terminated  or revoked sooner.       Influenza A by PCR NEGATIVE NEGATIVE Final  Influenza B by PCR NEGATIVE NEGATIVE Final    Comment: (NOTE) The Xpert Xpress SARS-CoV-2/FLU/RSV plus assay is  intended as an aid in the diagnosis of influenza from Nasopharyngeal swab specimens and should not be used as a sole basis for treatment. Nasal washings and aspirates are unacceptable for Xpert Xpress SARS-CoV-2/FLU/RSV testing.  Fact Sheet for Patients: BloggerCourse.com  Fact Sheet for Healthcare Providers: SeriousBroker.it  This test is not yet approved or cleared by the [place]  FDA and has been authorized for detection and/or diagnosis of SARS-CoV-2 by FDA under an Emergency Use Authorization (EUA). This EUA will remain in effect (meaning this test can be used) for the duration of the COVID-19 declaration under Section 564(b)(1) of the Act, 21 U.S.C. section 360bbb-3(b)(1), unless the authorization is terminated or revoked.     Resp Syncytial Virus by PCR NEGATIVE NEGATIVE Final    Comment: (NOTE) Fact Sheet for Patients: BloggerCourse.com  Fact Sheet for Healthcare Providers: SeriousBroker.it  This test is not yet approved or cleared by the [place]  FDA and has been authorized for detection and/or diagnosis of SARS-CoV-2 by FDA under an Emergency Use Authorization (EUA). This EUA will remain in effect (meaning this test can be used) for the duration of the COVID-19 declaration under Section 564(b)(1) of the Act, 21 U.S.C. section 360bbb-3(b)(1), unless the authorization is terminated or revoked.  Performed at [facility], [address] [place], [place] [address]     Procedures and diagnostic studies:  IR PICC PLACEMENT RIGHT >5 YRS INC IMG GUIDE Result Date: 08/04/2024 INDICATION: 89 year old female with recently placed left subclavian approach cardiac rhythm maintenance device. Unfortunately, she requires TPN and therefore IR is consulted for PICC placement. EXAM: PICC LINE PLACEMENT WITH ULTRASOUND AND FLUOROSCOPIC GUIDANCE MEDICATIONS: None.  ANESTHESIA/SEDATION: None. FLUOROSCOPY TIME:  Radiation exposure index: 2.9 mGy, air kerma COMPLICATIONS: SIR Level A - No therapy, no consequence. Inadvertent arterial puncture by the APP initially performing the procedure. The complication was recognized immediately and pressure held for 25 minutes. No evidence of hematoma. Physician then took over and completed placement of PICC. PROCEDURE: The patient was advised of the possible risks and complications and agreed to undergo the procedure. The patient was then brought to the angiographic suite for the procedure. The right arm was prepped with chlorhexidine , draped in the usual sterile fashion using maximum barrier technique (cap and mask, sterile gown, sterile gloves, large sterile sheet, hand hygiene and cutaneous antisepsis) and infiltrated locally with 1% Lidocaine . Ultrasound demonstrated patency of the right brachial vein, and this was documented with an image. Under real-time ultrasound guidance, this vein was accessed with a 21 gauge micropuncture needle and image documentation was performed. A 0.018 wire was introduced in to the vein. Over this, a 5 [place] dual lumen power-injectable PICC was advanced to the lower SVC. Fluoroscopy during the procedure and fluoro spot radiograph confirms appropriate catheter position. The catheter was flushed and covered with a sterile dressing. Catheter length: 32 cm IMPRESSION: Successful right arm Power PICC line placement with ultrasound and fluoroscopic guidance. The catheter is ready for use. Electronically Signed   By: Wilkie Lent M.D.   On: 08/04/2024 16:44               LOS: 5 days   Sady Monaco  [facility]   Pager on [email or web address]. If 7PM-7AM, please contact night-coverage at [email or web address]     08/05/2024, 11:50 AM

## 2024-08-05 NOTE — Progress Notes (Signed)
 [facility] SURGICAL PROGRESS NOTE  Hospital Day(s): 5.   Post op day(s): 2 Days Post-Op.   Interval History:  Patient seen and examined No acute events or new complaints overnight.  Patient reports she is doing well but continues to have abdominal pain. She has been pivoting on bed, has not sat in chair.  Abdomen is sore expectedlyd No fever, chills, nausea Renal function normal; sCr - 0.83; UO - 770 ccs Mild hyponatremia to 132; no other electrolyte derangements NGT in place; output 1000 ccs No reported flatus   Vital signs in last 24 hours: [min-max] current  Temp:  [97.2 F (36.2 C)-98.2 F (36.8 C)] 97.9 F (36.6 C) (10/11 0749) Pulse Rate:  [64-126] 95 (10/11 0749) Resp:  [16-20] 16 (10/11 0749) BP: (83-104)/(53-88) 90/61 (10/11 0749) SpO2:  [95 %-97 %] 95 % (10/11 0749) Weight:  [74.9 kg] 74.9 kg (10/11 0500)     Height: 5' 4 (162.6 cm) Weight: 74.9 kg BMI (Calculated): 28.33   Intake/Output last 2 shifts:  10/10 0701 - 10/11 0700 In: 681 [I.V.:681] Out: 1575 [Urine:575; Emesis/NG output:1000]   Physical Exam:  Constitutional: alert, cooperative and no distress  HEENT: NGT in place Respiratory: breathing non-labored at rest  Cardiovascular: regular rate and sinus rhythm  Gastrointestinal: soft, incisional soreness, and non-distended. No rebound/guarding Genitourinary: Foley catheter in place; good UO Integumentary: Laparotomy is CDI with staples and honeycomb, no erythema   Labs:     Latest Ref Rng & Units 08/05/2024    3:51 AM 08/03/2024    3:30 AM 08/01/2024    4:01 AM  CBC  WBC 4.0 - 10.5 K/uL 14.7  8.7  8.0   Hemoglobin 12.0 - 15.0 g/dL 88.0  86.9  87.7   Hematocrit 36.0 - 46.0 % 35.2  38.9  36.4   Platelets 150 - 400 K/uL 137  167  142       Latest Ref Rng & Units 08/05/2024    3:51 AM 08/04/2024    5:04 AM 08/03/2024    3:30 AM  CMP  Glucose 70 - 99 mg/dL 880  887  886   BUN 8 - 23 mg/dL 31  32  33   Creatinine 0.44 - 1.00  mg/dL 9.21  9.16  9.17   Sodium 135 - 145 mmol/L 132  132  135   Potassium 3.5 - 5.1 mmol/L 4.0  3.8  3.6   Chloride 98 - 111 mmol/L 102  103  102   CO2 22 - 32 mmol/L 19  22  23    Calcium  8.9 - 10.3 mg/dL 8.6  8.7  8.4      Imaging studies: No new pertinent imaging studies   Assessment/Plan: 89 y.o. female 2 Days Post-Op s/p exploratory laparotomy, reduction of internal hernia, SBR, and repair of ventral hernia    - arterial stick with line placement but no hematoma and no changes in pulse exam - She will still benefit from TPN given prolonged NPO status   - Continue NGT decompression; LIS; monitor and record output              - Incentive spirometer  - Monitor abdominal examination; on-going bowel function   - Pain control prn; antiemetics prn  - Mobilize; Okay to work with PT   - Further management per primary service; we will follow    Did have increase in wbc, will watch this and if continues to go up may need repeat scan in 48-72 hours.  All of the above findings and recommendations were discussed with the patient, patient's family (daughter at bedside), and the medical team, and all of patient's and family's questions were answered to their expressed satisfaction.  -- Jayson MALVA Endow

## 2024-08-05 NOTE — Progress Notes (Signed)
 Patient  had picc placed in IR, during placement brachial artery was stuck with needle. Writer and charge nurse [place] were told patients arm was to be in a sling for 24 hours and no heparin  was to be given for 24 hours. New order placed to start heparin  at 0500. Spoke to [employer] who stated it was to be held for 12 hours. Patients daughter at bedside who was also informed heparin  was to be held for 24 hours. Dr. Cleatus notified. Per Dr. Cleatus who stated hold until 0700 and defer to primary attending. [place] charge nurse notified, will endorse to 7 am Nurse.

## 2024-08-05 NOTE — Evaluation (Signed)
 Physical Therapy Evaluation Patient Details Name: Maria Trujillo MRN: [id number] DOB: May 10, 1934 Today's Date: 08/05/2024  History of Present Illness  89 y.o. female with medical history significant for CAD with prior NSTEMI, HTN, HLD, persistent A-fib on Eliquis  and amiodarone , complicated by post conversion  pauses/bradycardia with syncope 04/2024 s/p pacemaker on 07/24/2024 -(Eliquis  held 9/26 to 9/30), being admitted for small bowel obstruction.  Clinical Impression  Pt is pleasant 89 y.o. female admitted for small bowel obstruction. Permission to see patient  prior to bed rest removal by given nursing via secure chat. Prior to hospitalization, pt  was independent with amb without AD around her home and with [facility] for community distances. Pt able to perform ADLs independently as she lives alone.  Pt now requires mod A for supine<>sit for LE and trunk management and to adhere to NWB precautions for her RUE. requires min  A for STS from lowest bed height and mod A for step-pivot transfer to [facility]. Pt able to amb 71ft with HHA and mod A for assistance with weight shift to take steps. SPT provided verbal cues for sequencing. Pt demonstrates deficits in strength/balance/activity tolerance. Would benefit from skilled PT to address above deficits and promote optimal return to PLOF.       If plan is discharge home, recommend the following: Two people to help with walking and/or transfers;A lot of help with bathing/dressing/bathroom;Help with stairs or ramp for entrance   Can travel by private vehicle   No    Equipment Recommendations Other (comment) (TBD)  Recommendations for Other Services       Functional Status Assessment Patient has had a recent decline in their functional status and demonstrates the ability to make significant improvements in function in a reasonable and predictable amount of time.     Precautions / Restrictions Precautions Precautions: Fall;ICD/Pacemaker Recall of  Precautions/Restrictions: Intact Precaution/Restrictions Comments: NG tube Restrictions Weight Bearing Restrictions Per Provider Order: Yes RUE Weight Bearing Per Provider Order: Non weight bearing      Mobility  Bed Mobility Overal bed mobility: Needs Assistance Bed Mobility: Supine to Sit, Sit to Supine     Supine to sit: Mod assist Sit to supine: Mod assist   General bed mobility comments: Mod A to adhere to RUE NWB precautions. Assist for trunk management and LE management back to bed.    Transfers Overall transfer level: Needs assistance Equipment used: 1 person hand held assist Transfers: Sit to/from Stand, Bed to chair/wheelchair/BSC Sit to Stand: Min assist   Step pivot transfers: Mod assist       General transfer comment: Min A for STS from EOB. Pt requires Mod A for step pivot transfer to [facility] for assistance with weight shift to take steps.    Ambulation/Gait Ambulation/Gait assistance: Mod assist Gait Distance (Feet): 4 Feet Assistive device: 1 person hand held assist Gait Pattern/deviations: Trunk flexed, Steppage, Decreased step length - left, Decreased step length - right       General Gait Details: Pt able to correct posture with verbal cuing however returns to trunk flexed position quickly. Pt able to take steps to [facility] with mod A for assistance with weight shift to take steps.  Stairs            Wheelchair Mobility     Tilt Bed    Modified Rankin (Stroke Patients Only)       Balance Overall balance assessment: Needs assistance Sitting-balance support: Feet supported Sitting balance-Leahy Scale: Good Sitting balance - Comments: Pt  endorses dizziness upon sitting up however symptoms dec with prolonged sitting. CGA at first down to SBA for safety.   Standing balance support: Single extremity supported, No upper extremity supported Standing balance-Leahy Scale: Poor Standing balance comment: Pt relies on LUE support for balance. Unable to  use SPC d/t RUE weight bearing precaution and pt typically uses SPC in right hand.                             Pertinent Vitals/Pain Pain Assessment Pain Assessment: Faces Faces Pain Scale: Hurts a little bit Pain Location: neck Pain Descriptors / Indicators: Discomfort Pain Intervention(s): Limited activity within patient's tolerance    Home Living Family/patient expects to be discharged to:: Private residence Living Arrangements: Alone Available Help at Discharge: Family;Available 24 hours/day Type of Home: House Home Access: Ramped entrance       Home Layout: One level Home Equipment: Shower seat - built in;Cane - single point;BSC/3in1      Prior Function Prior Level of Function : Independent/Modified Independent;Driving             Mobility Comments: indep, ambulates without AD in home, uses [facility] for longer distances in community setting. no falls hx ADLs Comments: indep     Extremity/Trunk Assessment   Upper Extremity Assessment Upper Extremity Assessment: Generalized weakness    Lower Extremity Assessment Lower Extremity Assessment: Generalized weakness    Cervical / Trunk Assessment Cervical / Trunk Assessment: Kyphotic  Communication   Communication Communication: No apparent difficulties    Cognition Arousal: Alert Behavior During Therapy: WFL for tasks assessed/performed   PT - Cognitive impairments: No apparent impairments                       PT - Cognition Comments: Pt is A&Ox4. Pleasant and agreeable to PT session. Pt's daughter at bedside. Following commands: Intact       Cueing Cueing Techniques: Verbal cues     General Comments      Exercises Other Exercises Other Exercises: Assistance to [facility]. Max A for peri care and support for balance.   Assessment/Plan    PT Assessment Patient needs continued PT services  PT Problem List Decreased strength;Decreased activity tolerance;Decreased balance;Decreased  mobility;Decreased knowledge of use of DME       PT Treatment Interventions DME instruction;Gait training;Functional mobility training;Therapeutic activities;Therapeutic exercise;Balance training;Neuromuscular re-education;Patient/family education    PT Goals (Current goals can be found in the Care Plan section)  Acute Rehab PT Goals Patient Stated Goal: to get stronger PT Goal Formulation: With patient Time For Goal Achievement: 08/19/24 Potential to Achieve Goals: Fair    Frequency Min 2X/week     Co-evaluation               AM-PAC PT 6 Clicks Mobility  Outcome Measure Help needed turning from your back to your side while in a flat bed without using bedrails?: A Little Help needed moving from lying on your back to sitting on the side of a flat bed without using bedrails?: A Little Help needed moving to and from a bed to a chair (including a wheelchair)?: A Lot Help needed standing up from a chair using your arms (e.g., wheelchair or bedside chair)?: A Little Help needed to walk in hospital room?: A Lot Help needed climbing 3-5 steps with a railing? : Total 6 Click Score: 14    End of Session Equipment Utilized During Treatment: Gait  belt Activity Tolerance: Patient tolerated treatment well Patient left: in bed;with call bell/phone within reach;with bed alarm set;with family/visitor present Nurse Communication: Mobility status PT Visit Diagnosis: Unsteadiness on feet (R26.81);Muscle weakness (generalized) (M62.81);Difficulty in walking, not elsewhere classified (R26.2)    Time: 9166-9098 PT Time Calculation (min) (ACUTE ONLY): 28 min   Charges:                 Adreona Brand, SPT   Imogean Ciampa 08/05/2024, 10:15 AM

## 2024-08-06 DIAGNOSIS — K56609 Unspecified intestinal obstruction, unspecified as to partial versus complete obstruction: Secondary | ICD-10-CM | POA: Diagnosis not present

## 2024-08-06 DIAGNOSIS — N390 Urinary tract infection, site not specified: Secondary | ICD-10-CM | POA: Diagnosis not present

## 2024-08-06 DIAGNOSIS — Z515 Encounter for palliative care: Secondary | ICD-10-CM | POA: Diagnosis not present

## 2024-08-06 DIAGNOSIS — Z950 Presence of cardiac pacemaker: Secondary | ICD-10-CM | POA: Diagnosis not present

## 2024-08-06 LAB — RENAL FUNCTION PANEL
Albumin: 2.5 g/dL — ABNORMAL LOW (ref 3.5–5.0)
Anion gap: 9 (ref 5–15)
BUN: 27 mg/dL — ABNORMAL HIGH (ref 8–23)
CO2: 21 mmol/L — ABNORMAL LOW (ref 22–32)
Calcium: 8.6 mg/dL — ABNORMAL LOW (ref 8.9–10.3)
Chloride: 104 mmol/L (ref 98–111)
Creatinine, Ser: 0.62 mg/dL (ref 0.44–1.00)
GFR, Estimated: >60 mL/min (ref >60)
Glucose, Bld: 105 mg/dL — ABNORMAL HIGH (ref 70–99)
Phosphorus: 2.9 mg/dL (ref 2.5–4.6)
Potassium: 4.1 mmol/L (ref 3.5–5.1)
Sodium: 134 mmol/L — ABNORMAL LOW (ref 135–145)

## 2024-08-06 LAB — CBC
HCT: 32.2 % — ABNORMAL LOW (ref 36.0–46.0)
Hemoglobin: 11.1 g/dL — ABNORMAL LOW (ref 12.0–15.0)
MCH: 30.7 pg (ref 26.0–34.0)
MCHC: 34.5 g/dL (ref 30.0–36.0)
MCV: 89.2 fL (ref 80.0–100.0)
Platelets: 134 K/uL — ABNORMAL LOW (ref 150–400)
RBC: 3.61 MIL/uL — ABNORMAL LOW (ref 3.87–5.11)
RDW: 13.6 % (ref 11.5–15.5)
WBC: 12.7 K/uL — ABNORMAL HIGH (ref 4.0–10.5)
nRBC: 0 % (ref 0.0–0.2)

## 2024-08-06 LAB — GLUCOSE, CAPILLARY
Glucose-Capillary: 101 mg/dL — ABNORMAL HIGH (ref 70–99)
Glucose-Capillary: 110 mg/dL — ABNORMAL HIGH (ref 70–99)
Glucose-Capillary: 116 mg/dL — ABNORMAL HIGH (ref 70–99)
Glucose-Capillary: 118 mg/dL — ABNORMAL HIGH (ref 70–99)
Glucose-Capillary: 95 mg/dL (ref 70–99)

## 2024-08-06 LAB — MAGNESIUM: Magnesium: 2.3 mg/dL (ref 1.7–2.4)

## 2024-08-06 LAB — HEPARIN LEVEL (UNFRACTIONATED)
Heparin Unfractionated: 0.24 [IU]/mL — ABNORMAL LOW (ref 0.30–0.70)
Heparin Unfractionated: 0.46 [IU]/mL (ref 0.30–0.70)

## 2024-08-06 MED ORDER — TRAVASOL 10 % IV SOLN
INTRAVENOUS | Status: AC
  Filled 2024-08-06: qty 840

## 2024-08-06 MED ORDER — HEPARIN BOLUS VIA INFUSION
1050.0000 [IU] | Freq: Once | INTRAVENOUS | Status: AC
  Administered 2024-08-06: 1050 [IU] via INTRAVENOUS
  Filled 2024-08-06: qty 1050

## 2024-08-06 NOTE — Plan of Care (Signed)
   Problem: Education: Goal: Knowledge of General Education information will improve Description Including pain rating scale, medication(s)/side effects and non-pharmacologic comfort measures Outcome: Progressing

## 2024-08-06 NOTE — Progress Notes (Signed)
 Progress Note    Maria Trujillo  FMW:[id number] DOB: 22-Nov-1933  DOA: 07/30/2024 PCP: Jeffie Cheryl BRAVO, MD      Brief Narrative:    Medical records reviewed and are as summarized below:  Maria Trujillo is a 89 y.o. female with medical history significant for CAD with prior NSTEMI, HTN, HLD, persistent A-fib on Eliquis  and amiodarone , complicated by post conversion pauses/bradycardia with syncope 04/2024 s/p pacemaker on 07/24/2024 -(Eliquis  held 9/26 to 9/30).  She presented to the hospital because of vomiting and abdominal pain.  She was admitted to the hospital for small bowel obstruction.  NG tube was placed and general surgery was consulted.  NG tube was placed for gastric decompression.  NG tube was subsequently removed and patient was started on clear liquid diet.  However, she failed Gastrografin challenge.  She was taken to the OR for exploratory laparotomy with reduction of internal hernia and resection of small bowel with primary anastomosis and lysis of adhesion on 08/03/2024.   Assessment/Plan:   Principal Problem:   SBO (small bowel obstruction) (HCC) Active Problems:   Coronary artery disease with history of NSTEMI   Status cardiac pacemaker 07/24/2024 for sinus pause with syncope   Paroxysmal atrial fibrillation (HCC)   Chronic anticoagulation   HTN (hypertension)   Acquired hypothyroidism   Small bowel obstruction (HCC)   Nutrition Problem: Inadequate oral intake Etiology: altered GI function  Signs/Symptoms: NPO status   Body mass index is 28.08 kg/m.   Small bowel obstruction: Failed conservative management.  S/p ex lap, reduction of internal hernia, small bowel resection with primary anastomosis and repair of ventral hernia on 08/03/2024. She is still NPO and requiring TPN for nutrition.  Analgesics as needed for pain.   Paroxysmal atrial fibrillation with RVR: Continue IV amiodarone  drip.  Continue IV heparin  drip and monitor heparin  level per  protocol.   Eliquis  on hold.   CAD with previous NSTEMI, s/p cardiac pacemaker on 07/24/2024 for sinus pause with syncope   Mild hyponatremia: Asymptomatic.  Monitor BMP   Comorbidities include hypertension, hypothyroidism   Diet Order             Diet NPO time specified Except for: Ice Chips  Diet effective now                                  Consultants: Cardiologist General Surgeon  Procedures:  S/p ex lap, reduction of internal hernia, small bowel resection with primary anastomosis and repair of ventral hernia on 08/03/2024. PICC line placement in the right brachial vein on 08/04/2024 by IR    Medications:    Chlorhexidine  Gluconate Cloth  6 each Topical Q0600   insulin aspart  0-9 Units Subcutaneous Q6H   latanoprost   1 drop Right Eye QHS   levothyroxine   100 mcg Oral QAC breakfast   pantoprazole  (PROTONIX ) IV  40 mg Intravenous Q24H   thiamine (VITAMIN B1) injection  100 mg Intravenous Daily   timolol   1 drop Right Eye Daily   Continuous Infusions:  amiodarone  30 mg/hr (08/06/24 0927)   heparin  1,150 Units/hr (08/06/24 0336)   promethazine  (PHENERGAN ) injection (IM or IVPB) 150 mL/hr at 08/06/24 0344   TPN ADULT (ION) 35 mL/hr at 08/06/24 0336   TPN ADULT (ION)       Anti-infectives (From admission, onward)    Start     Dose/Rate Route Frequency Ordered Stop  08/03/24 2000  ciprofloxacin (CIPRO) IVPB 400 mg        400 mg 200 mL/hr over 60 Minutes Intravenous Every 12 hours 08/03/24 1059 08/04/24 1100   08/03/24 2000  metroNIDAZOLE (FLAGYL) IVPB 500 mg        500 mg 100 mL/hr over 60 Minutes Intravenous Every 12 hours 08/03/24 1059 08/04/24 1240   08/03/24 0700  ciprofloxacin (CIPRO) IVPB 400 mg        400 mg 200 mL/hr over 60 Minutes Intravenous  Once 08/02/24 1713 08/03/24 0734   08/03/24 0700  metroNIDAZOLE (FLAGYL) IVPB 500 mg        500 mg 100 mL/hr over 60 Minutes Intravenous  Once 08/02/24 1713 08/03/24 0734   07/30/24  2000  levofloxacin (LEVAQUIN) tablet 250 mg  Status:  Discontinued        250 mg Oral  Once 07/30/24 1958 07/30/24 1959   07/30/24 2000  levofloxacin (LEVAQUIN) tablet 750 mg        750 mg Oral  Once 07/30/24 1959 07/30/24 2021              Family Communication/Anticipated D/C date and plan/Code Status   DVT prophylaxis:      Code Status: Full Code  Family Communication: Plan discussed with Dwayne, daughter, at the bedside Disposition Plan: Plan to discharge home   Status is: Inpatient Remains inpatient appropriate because: Ex lap for small bowel obstruction       Subjective:   Interval events noted.  She is passing gas but no bowel movement.  No vomiting.  Abdominal pain is better. Dwayne, daughter, at the bedside  Objective:    Vitals:   08/06/24 0449 08/06/24 0726 08/06/24 0830 08/06/24 1211  BP:   96/65 (!) 99/51  Pulse:   60 (!) 105  Resp: 20 20 20 20   Temp:   98 F (36.7 C) 98 F (36.7 C)  TempSrc:      SpO2:   96% 96%  Weight: 74.2 kg     Height:       No data found.   Intake/Output Summary (Last 24 hours) at 08/06/2024 1216 Last data filed at 08/06/2024 1147 Gross per 24 hour  Intake 1712.79 ml  Output 1500 ml  Net 212.79 ml   Filed Weights   08/03/24 1903 08/05/24 0500 08/06/24 0449  Weight: 74.3 kg 74.9 kg 74.2 kg    Exam:   GEN: NAD SKIN: Warm and dry EYES: No pallor or icterus ENT: MMM CV: Irregular rate and rhythm, tachycardic PULM: CTA B ABD: soft, ND, +BS, mild surgical tenderness without rebound tenderness or guarding.  Surgical incisions are clean, dry and intact CNS: AAO x 3, non focal EXT: No edema or tenderness      Data Reviewed:   I have personally reviewed following labs and imaging studies:  Labs: Labs show the following:   Basic Metabolic Panel: Recent Labs  Lab 08/01/24 0401 08/03/24 0330 08/04/24 0504 08/05/24 0351 08/06/24 0134 08/06/24 0137  NA 139 135 132* 132* 134*  --   K 3.9 3.6 3.8  4.0 4.1  --   CL 108 102 103 102 104  --   CO2 25 23 22  19* 21*  --   GLUCOSE 97 113* 112* 119* 105*  --   BUN 29* 33* 32* 31* 27*  --   CREATININE 0.69 0.82 0.83 0.78 0.62  --   CALCIUM  8.9 8.4* 8.7* 8.6* 8.6*  --   MG 2.1 2.1 1.8  2.2  --  2.3  PHOS  --  2.8 3.0 2.2* 2.9  --    GFR Estimated Creatinine Clearance: 48 mL/min (by C-G formula based on SCr of 0.62 mg/dL). Liver Function Tests: Recent Labs  Lab 07/30/24 1800 08/06/24 0134  AST 24  --   ALT 18  --   ALKPHOS 89  --   BILITOT 1.1  --   PROT 7.1  --   ALBUMIN 3.7 2.5*   Recent Labs  Lab 07/30/24 1800  LIPASE 31   No results for input(s): AMMONIA in the last 168 hours. Coagulation profile Recent Labs  Lab 08/04/24 1328  INR 1.4*    CBC: Recent Labs  Lab 07/30/24 1800 07/31/24 0415 08/01/24 0401 08/03/24 0330 08/05/24 0351 08/06/24 0137  WBC 12.4* 11.3* 8.0 8.7 14.7* 12.7*  NEUTROABS 10.9*  --   --   --   --   --   HGB 12.6 12.3 12.2 13.0 11.9* 11.1*  HCT 39.1 36.5 36.4 38.9 35.2* 32.2*  MCV 93.5 91.5 91.0 90.5 90.0 89.2  PLT 158 148* 142* 167 137* 134*   Cardiac Enzymes: No results for input(s): CKTOTAL, CKMB, CKMBINDEX, TROPONINI in the last 168 hours. BNP (last 3 results) No results for input(s): PROBNP in the last 8760 hours. CBG: Recent Labs  Lab 08/05/24 1745 08/06/24 0003 08/06/24 0457 08/06/24 0757 08/06/24 1207  GLUCAP 131* 118* 116* 110* 95   D-Dimer: No results for input(s): DDIMER in the last 72 hours. Hgb A1c: Recent Labs    08/04/24 0504  HGBA1C 5.1   Lipid Profile: No results for input(s): CHOL, HDL, LDLCALC, TRIG, CHOLHDL, LDLDIRECT in the last 72 hours. Thyroid  function studies: No results for input(s): TSH, T4TOTAL, T3FREE, THYROIDAB in the last 72 hours.  Invalid input(s): FREET3 Anemia work up: No results for input(s): VITAMINB12, FOLATE, FERRITIN, TIBC, IRON, RETICCTPCT in the last 72 hours. Sepsis  Labs: Recent Labs  Lab 08/01/24 0401 08/03/24 0330 08/05/24 0351 08/06/24 0137  WBC 8.0 8.7 14.7* 12.7*    Microbiology Recent Results (from the past 240 hours)  Resp panel by RT-PCR (RSV, Flu A&B, Covid) Anterior Nasal Swab     Status: None   Collection Time: 07/30/24  7:08 PM   Specimen: Anterior Nasal Swab  Result Value Ref Range Status   SARS Coronavirus 2 by RT PCR NEGATIVE NEGATIVE Final    Comment: (NOTE) SARS-CoV-2 target nucleic acids are NOT DETECTED.  The SARS-CoV-2 RNA is generally detectable in upper respiratory specimens during the acute phase of infection. The lowest concentration of SARS-CoV-2 viral copies this assay can detect is 138 copies/mL. A negative result does not preclude SARS-Cov-2 infection and should not be used as the sole basis for treatment or other patient management decisions. A negative result may occur with  improper specimen collection/handling, submission of specimen other than nasopharyngeal swab, presence of viral mutation(s) within the areas targeted by this assay, and inadequate number of viral copies(<138 copies/mL). A negative result must be combined with clinical observations, patient history, and epidemiological information. The expected result is Negative.  Fact Sheet for Patients:  BloggerCourse.com  Fact Sheet for Healthcare Providers:  SeriousBroker.it  This test is no t yet approved or cleared by the [place]  FDA and  has been authorized for detection and/or diagnosis of SARS-CoV-2 by FDA under an Emergency Use Authorization (EUA). This EUA will remain  in effect (meaning this test can be used) for the duration of the COVID-19 declaration under Section 564(b)(1)  of the Act, 21 U.S.C.section 360bbb-3(b)(1), unless the authorization is terminated  or revoked sooner.       Influenza A by PCR NEGATIVE NEGATIVE Final   Influenza B by PCR NEGATIVE NEGATIVE Final     Comment: (NOTE) The Xpert Xpress SARS-CoV-2/FLU/RSV plus assay is intended as an aid in the diagnosis of influenza from Nasopharyngeal swab specimens and should not be used as a sole basis for treatment. Nasal washings and aspirates are unacceptable for Xpert Xpress SARS-CoV-2/FLU/RSV testing.  Fact Sheet for Patients: BloggerCourse.com  Fact Sheet for Healthcare Providers: SeriousBroker.it  This test is not yet approved or cleared by the [place]  FDA and has been authorized for detection and/or diagnosis of SARS-CoV-2 by FDA under an Emergency Use Authorization (EUA). This EUA will remain in effect (meaning this test can be used) for the duration of the COVID-19 declaration under Section 564(b)(1) of the Act, 21 U.S.C. section 360bbb-3(b)(1), unless the authorization is terminated or revoked.     Resp Syncytial Virus by PCR NEGATIVE NEGATIVE Final    Comment: (NOTE) Fact Sheet for Patients: BloggerCourse.com  Fact Sheet for Healthcare Providers: SeriousBroker.it  This test is not yet approved or cleared by the [place]  FDA and has been authorized for detection and/or diagnosis of SARS-CoV-2 by FDA under an Emergency Use Authorization (EUA). This EUA will remain in effect (meaning this test can be used) for the duration of the COVID-19 declaration under Section 564(b)(1) of the Act, 21 U.S.C. section 360bbb-3(b)(1), unless the authorization is terminated or revoked.  Performed at [facility], [address] [place], [place] [address]     Procedures and diagnostic studies:  IR PICC PLACEMENT RIGHT >5 YRS INC IMG GUIDE Result Date: 08/04/2024 INDICATION: 89 year old female with recently placed left subclavian approach cardiac rhythm maintenance device. Unfortunately, she requires TPN and therefore IR is consulted for PICC placement. EXAM: PICC LINE  PLACEMENT WITH ULTRASOUND AND FLUOROSCOPIC GUIDANCE MEDICATIONS: None. ANESTHESIA/SEDATION: None. FLUOROSCOPY TIME:  Radiation exposure index: 2.9 mGy, air kerma COMPLICATIONS: SIR Level A - No therapy, no consequence. Inadvertent arterial puncture by the APP initially performing the procedure. The complication was recognized immediately and pressure held for 25 minutes. No evidence of hematoma. Physician then took over and completed placement of PICC. PROCEDURE: The patient was advised of the possible risks and complications and agreed to undergo the procedure. The patient was then brought to the angiographic suite for the procedure. The right arm was prepped with chlorhexidine , draped in the usual sterile fashion using maximum barrier technique (cap and mask, sterile gown, sterile gloves, large sterile sheet, hand hygiene and cutaneous antisepsis) and infiltrated locally with 1% Lidocaine . Ultrasound demonstrated patency of the right brachial vein, and this was documented with an image. Under real-time ultrasound guidance, this vein was accessed with a 21 gauge micropuncture needle and image documentation was performed. A 0.018 wire was introduced in to the vein. Over this, a 5 [place] dual lumen power-injectable PICC was advanced to the lower SVC. Fluoroscopy during the procedure and fluoro spot radiograph confirms appropriate catheter position. The catheter was flushed and covered with a sterile dressing. Catheter length: 32 cm IMPRESSION: Successful right arm Power PICC line placement with ultrasound and fluoroscopic guidance. The catheter is ready for use. Electronically Signed   By: Wilkie Lent M.D.   On: 08/04/2024 16:44               LOS: 6 days   Kelcey Wickstrom  [facility]   Pager  on [email or web address]. If 7PM-7AM, please contact night-coverage at [email or web address]     08/06/2024, 12:16 PM

## 2024-08-06 NOTE — Progress Notes (Signed)
 PHARMACY - ANTICOAGULATION CONSULT NOTE  Pharmacy Consult for Heparin  infusion  Indication: atrial fibrillation  Allergies  Allergen Reactions   Celecoxib Hives   Dorzolamide Shortness Of Breath    Other Reaction(s): Other (See Comments)  Pt stated had reactions but unable to provide information   Penicillins Other (See Comments), Hives and Rash    Redness (sunburn type rash with peeling)  Has patient had a PCN reaction causing immediate rash, facial/tongue/throat swelling, SOB or lightheadedness with hypotension: No  Has patient had a PCN reaction causing severe rash involving mucus membranes or skin necrosis: No  Has patient had a PCN reaction that required hospitalization No  Has patient had a PCN reaction occurring within the last 10 years: Yes  If all of the above answers are NO, then may proceed with Cephalosporin use.  Other Reaction(s): Other (See Comments)  Redness (sunburn type rash with peeling)  Has patient had a PCN reaction causing immediate rash, facial/tongue/throat swelling, SOB or lightheadedness with hypotension: No  Has patient had a PCN reaction causing severe rash involving mucus membranes or skin necrosis: No  Has patient had a PCN reaction that required hospitalization No  Has patient had a PCN reaction occurring within the last 10 years: Yes  If all of the above answers are NO, then may proceed with Cephalosporin use.  Redness and flushing  Redness and flushing  Redness (sunburn type rash with peeling)   Shellfish Allergy Hives and Itching    All seafood   Timolol  Maleate Other (See Comments) and Shortness Of Breath    Redness  Other Reaction(s): Other (See Comments)  Shortness of breath  Redness  Redness  Higher Doses  Shortness of breath    Redness   Sulfa Antibiotics Other (See Comments)    Redness in eyes   Amoxicillin Rash   Amoxicillin-Pot Clavulanate Other (See Comments)    Redness and flushing   Brimonidine Other (See  Comments)    blisters   Cefuroxime Axetil Other (See Comments)    redness   Dipivefrin Other (See Comments)    sleepiness   Oxycodone      BP dropped  Other Reaction(s): Other (See Comments)  Dropped BP down too low  Dropped blood pressure   BP dropped  Dropped BP down too low    Patient Measurements: Height: 5' 4 (162.6 cm) Weight: 74.2 kg (163 lb 9.3 oz) IBW/kg (Calculated) : 54.7 HEPARIN  DW (KG): 70.2  Vital Signs: Temp: 98 F (36.7 C) (10/12 1211) Temp Source: Oral (10/12 0340) BP: 99/51 (10/12 1211) Pulse Rate: 105 (10/12 1211)  Labs: Recent Labs    08/04/24 0504 08/04/24 1328 08/05/24 0351 08/06/24 0134 08/06/24 0137 08/06/24 1149  HGB  --   --  11.9*  --  11.1*  --   HCT  --   --  35.2*  --  32.2*  --   PLT  --   --  137*  --  134*  --   APTT  --  40*  --   --   --   --   LABPROT  --  17.8*  --   --   --   --   INR  --  1.4*  --   --   --   --   HEPARINUNFRC  --  <0.10*  --  0.24*  --  0.46  CREATININE 0.83  --  0.78 0.62  --   --     Estimated Creatinine Clearance: 48 mL/min (by  C-G formula based on SCr of 0.62 mg/dL).   Medical History: Past Medical History:  Diagnosis Date   Arthritis    Atrial fibrillation (HCC)    History of kidney stones    Hypothyroidism    Myocardial infarct (HCC)    PONV (postoperative nausea and vomiting)     Medications:  Medications Prior to Admission  Medication Sig Dispense Refill Last Dose/Taking   acetaminophen  (TYLENOL ) 650 MG CR tablet Take 650 mg by mouth daily as needed for pain.   Taking As Needed   amiodarone  (PACERONE ) 200 MG tablet Take 2 tablets (400 mg total) by mouth 2 (two) times daily for 10 days, THEN 1 tablet (200 mg total) daily. (Patient taking differently:  (200 mg total) daily.) 70 tablet 0 07/30/2024 Morning   apixaban  (ELIQUIS ) 5 MG TABS tablet Take 1 tablet (5 mg total) by mouth 2 (two) times daily. 180 tablet 3 07/30/2024 Morning   atorvastatin  (LIPITOR ) 40 MG tablet Take 40 mg by mouth  at bedtime.   07/29/2024 Bedtime   azelastine  (ASTELIN ) 0.1 % nasal spray Place 1 spray into both nostrils daily.   07/30/2024 Morning   bimatoprost (LUMIGAN) 0.03 % ophthalmic solution Place 1 drop into the right eye at bedtime.   07/29/2024 Bedtime   Calcium  Carb-Cholecalciferol  (CALCIUM  CARBONATE-VITAMIN D3) 600-400 MG-UNIT TABS Take 1 tablet by mouth daily.   07/30/2024 Morning   Carboxymethylcellulose Sodium (THERATEARS) 0.25 % SOLN Place 1 drop into both eyes daily as needed (Dry eyes).   Taking As Needed   Cholecalciferol  (VITAMIN D ) 50 MCG (2000 UT) tablet Take 2,000 Units by mouth daily.   07/30/2024 Morning   Docusate Sodium  100 MG capsule Take 100 mg by mouth daily as needed for mild constipation or moderate constipation.   Taking As Needed   fexofenadine (ALLEGRA) 180 MG tablet Take 180 mg by mouth at bedtime.   07/29/2024 Bedtime   folic acid (FOLVITE) 400 MCG tablet Take 400 mcg by mouth daily.   Taking   ipratropium (ATROVENT) 0.06 % nasal spray Place 1 spray into both nostrils daily as needed for rhinitis.   Taking As Needed   isosorbide  mononitrate (IMDUR ) 30 MG 24 hr tablet Take 1 tablet (30 mg total) by mouth daily. 30 tablet 2 07/30/2024 Morning   levothyroxine  (SYNTHROID , LEVOTHROID) 100 MCG tablet Take 100 mcg by mouth daily before breakfast.   07/30/2024 Morning   lisinopril  (ZESTRIL ) 5 MG tablet Take 1 tablet (5 mg total) by mouth daily. 30 tablet 2 07/30/2024 Morning   LUMIGAN 0.01 % SOLN Place 1 drop into the right eye at bedtime.   Taking   metoprolol  tartrate (LOPRESSOR ) 25 MG tablet Take 0.5 tablets (12.5 mg total) by mouth daily as needed. for heart rate over 100.  Home med. 10 tablet 1 Unknown   Multiple Vitamin (MULTIVITAMIN WITH MINERALS) TABS tablet Take 1 tablet by mouth daily.   07/30/2024 Morning   Multiple Vitamins-Minerals (PRESERVISION AREDS 2 PO) Take 2 tablets by mouth 2 (two) times daily.   Taking   pantoprazole  (PROTONIX ) 40 MG tablet Take 40 mg by mouth daily.    07/30/2024 Morning   prednisoLONE acetate (PRED FORTE) 1 % ophthalmic suspension Place 1 drop into the right eye 4 (four) times daily.   Taking   timolol  (TIMOPTIC ) 0.5 % ophthalmic solution Place 1 drop into the right eye in the morning.   07/30/2024 Morning   vitamin B-12 (CYANOCOBALAMIN ) 100 MCG tablet Take 100 mcg by mouth daily.  07/30/2024 Morning   furosemide  (LASIX ) 20 MG tablet Take 20 mg by mouth daily. prn (Patient not taking: Reported on 07/21/2024)       Assessment: 89 yo female with PMH of A. Fib, HTN, HLD, NSTEMI, and CAD admitted with small bowel obstruction s/p exploratory laparotomy and resection on 10/9. Patient remains NPO, TPN starting 10/10. Pharmacy has been consulted for heparin  infusion monitoring and dosing for A. Fib. Last apixaban  dose reported 10/5.  Update 10/10 following PICC placement -- brachial artery was struck, hemostasis now achieved. Heparin  will now be restarted 12 hours after procedure.  1012 1149 HL 0.46.   Goal of Therapy:  Heparin  level 0.3-0.7 units/ml Monitor platelets by anticoagulation protocol: Yes   Plan:  Heparin  level is therapeutic. Will continue heparin  infusion at 1150 units/hr. Will order heparin  level and CBC with AM labs.   Cathaleen GORMAN Blanch, PharmD, BCPS Clinical Pharmacist 08/06/2024 12:24 PM

## 2024-08-06 NOTE — TOC Progression Note (Addendum)
 Transition of Care [facility]) - Progression Note    Patient Details  Name: Maria Trujillo MRN: [id number] Date of Birth: 01-03-1934  Transition of Care [facility]) CM/SW Contact  Victory Jackquline RAMAN, RN Phone Number: 08/06/2024, 3:54 PM  Clinical Narrative:  RNCM, spoke with the patient, and family at the bedside. I introduced myself, my role, and explained that discharge planning recommendations would be discussed. PT recommended Short Term Rehab and the patient is in agreement and would like to go to either [facility] or [facility]. PASRR# [id number], FL2 and Bed Offers submitted. RNCM will continue to follow discharge for planning/care coordination and update as applicable.                     Expected Discharge Plan and Services                                               Social Drivers of Health (SDOH) Interventions SDOH Screenings   Food Insecurity: No Food Insecurity (07/30/2024)  Housing: Low Risk  (07/30/2024)  Transportation Needs: No Transportation Needs (07/30/2024)  Utilities: Not At Risk (07/30/2024)  Depression (PHQ2-9): Low Risk  (04/29/2021)  Financial Resource Strain: Low Risk  (01/20/2024)   Received from [facility]  Physical Activity: Inactive (11/03/2018)   Received from [facility]  Social Connections: Moderately Isolated (07/30/2024)  Stress: No Stress Concern Present (11/03/2018)   Received from [facility]  Tobacco Use: Low Risk  (08/03/2024)  Health Literacy: Low Risk  (02/03/2021)   Received from [facility]    Readmission Risk Interventions     No data to display

## 2024-08-06 NOTE — Progress Notes (Signed)
 [facility] SURGICAL PROGRESS NOTE  Hospital Day(s): 6.   Post op day(s): 3 Days Post-Op.   Interval History:  Patient seen and examined No acute events or new complaints overnight.  Patient reports she is doing well but continues to have abdominal pain.  She is moving much better today.  She reports that she feels some gurgling in her stomach but has not passed any flatus.  Her NG tube output is lightening up. Abdomen is sore expectedlyd No fever, chills, nausea   Vital signs in last 24 hours: [min-max] current  Temp:  [97.7 F (36.5 C)-98.1 F (36.7 C)] 98 F (36.7 C) (10/12 1211) Pulse Rate:  [60-106] 105 (10/12 1211) Resp:  [17-20] 20 (10/12 1211) BP: (90-99)/(51-76) 99/51 (10/12 1211) SpO2:  [95 %-96 %] 96 % (10/12 1211) Weight:  [74.2 kg] 74.2 kg (10/12 0449)     Height: 5' 4 (162.6 cm) Weight: 74.2 kg BMI (Calculated): 28.06   Intake/Output last 2 shifts:  10/11 0701 - 10/12 0700 In: 1765.8 [I.V.:1236.6; IV Piggyback:529.2] Out: 1225 [Urine:1225]   Physical Exam:  Constitutional: alert, cooperative and no distress  HEENT: NGT in place Respiratory: breathing non-labored at rest  Cardiovascular: regular rate and sinus rhythm  Gastrointestinal: soft, incisional soreness, and distention improved from yesterday. Genitourinary: Foley catheter in place; good UO Integumentary: Laparotomy is CDI with staples and honeycomb, no erythema   Labs:     Latest Ref Rng & Units 08/06/2024    1:37 AM 08/05/2024    3:51 AM 08/03/2024    3:30 AM  CBC  WBC 4.0 - 10.5 K/uL 12.7  14.7  8.7   Hemoglobin 12.0 - 15.0 g/dL 88.8  88.0  86.9   Hematocrit 36.0 - 46.0 % 32.2  35.2  38.9   Platelets 150 - 400 K/uL 134  137  167       Latest Ref Rng & Units 08/06/2024    1:34 AM 08/05/2024    3:51 AM 08/04/2024    5:04 AM  CMP  Glucose 70 - 99 mg/dL 894  880  887   BUN 8 - 23 mg/dL 27  31  32   Creatinine 0.44 - 1.00 mg/dL 9.37  9.21  9.16   Sodium 135 - 145 mmol/L  134  132  132   Potassium 3.5 - 5.1 mmol/L 4.1  4.0  3.8   Chloride 98 - 111 mmol/L 104  102  103   CO2 22 - 32 mmol/L 21  19  22    Calcium  8.9 - 10.3 mg/dL 8.6  8.6  8.7      Imaging studies: No new pertinent imaging studies   Assessment/Plan: 89 y.o. female 3 Days Post-Op s/p exploratory laparotomy, reduction of internal hernia, SBR, and repair of ventral hernia    - arterial stick with line placement but no hematoma and no changes in pulse exam - She will still benefit from TPN given prolonged NPO status   - Continue NGT decompression; LIS; monitor and record output.              - Incentive spirometer  - Monitor abdominal examination; on-going bowel function   - Pain control prn; antiemetics prn  - Mobilize; Okay to work with PT   - Further management per primary service; we will follow    White blood cell decreasing from an increase today.  I think that we should continue decompression until she starts to have bowel function.  She does feel growing  in her stomach and I think that her bowels are starting to wake up. All of the above findings and recommendations were discussed with the patient, patient's family (daughter at bedside), and the medical team, and all of patient's and family's questions were answered to their expressed satisfaction.  -- Jayson MALVA Endow

## 2024-08-06 NOTE — Progress Notes (Signed)
 [facility] Cardiology  SUBJECTIVE: Patient seen laying in bed, denies chest pain shortness of breath   Vitals:   08/06/24 0340 08/06/24 0449 08/06/24 0726 08/06/24 0830  BP: 90/61   96/65  Pulse: (!) 106   60  Resp: 17 20 20 20   Temp: 97.7 F (36.5 C)   98 F (36.7 C)  TempSrc: Oral     SpO2: 95%   96%  Weight:  74.2 kg    Height:         Intake/Output Summary (Last 24 hours) at 08/06/2024 0944 Last data filed at 08/06/2024 0800 Gross per 24 hour  Intake 1712.79 ml  Output 1350 ml  Net 362.79 ml      PHYSICAL EXAM  General: Well developed, well nourished, in no acute distress HEENT:  Normocephalic and atramatic Neck:  No JVD.  Lungs: Clear bilaterally to auscultation and percussion. Heart: HRRR . Normal S1 and S2 without gallops or murmurs.  Abdomen: Bowel sounds are positive, abdomen soft and non-tender  Msk:  Back normal, normal gait. Normal strength and tone for age. Extremities: No clubbing, cyanosis or edema.   Neuro: Alert and oriented X 3. Psych:  Good affect, responds appropriately   LABS: Basic Metabolic Panel: Recent Labs    08/05/24 0351 08/06/24 0134 08/06/24 0137  NA 132* 134*  --   K 4.0 4.1  --   CL 102 104  --   CO2 19* 21*  --   GLUCOSE 119* 105*  --   BUN 31* 27*  --   CREATININE 0.78 0.62  --   CALCIUM  8.6* 8.6*  --   MG 2.2  --  2.3  PHOS 2.2* 2.9  --    Liver Function Tests: Recent Labs    08/06/24 0134  ALBUMIN 2.5*   No results for input(s): LIPASE, AMYLASE in the last 72 hours. CBC: Recent Labs    08/05/24 0351 08/06/24 0137  WBC 14.7* 12.7*  HGB 11.9* 11.1*  HCT 35.2* 32.2*  MCV 90.0 89.2  PLT 137* 134*   Cardiac Enzymes: No results for input(s): CKTOTAL, CKMB, CKMBINDEX, TROPONINI in the last 72 hours. BNP: Invalid input(s): POCBNP D-Dimer: No results for input(s): DDIMER in the last 72 hours. Hemoglobin A1C: Recent Labs    08/04/24 0504  HGBA1C 5.1   Fasting Lipid Panel: No results for  input(s): CHOL, HDL, LDLCALC, TRIG, CHOLHDL, LDLDIRECT in the last 72 hours. Thyroid  Function Tests: No results for input(s): TSH, T4TOTAL, T3FREE, THYROIDAB in the last 72 hours.  Invalid input(s): FREET3 Anemia Panel: No results for input(s): VITAMINB12, FOLATE, FERRITIN, TIBC, IRON, RETICCTPCT in the last 72 hours.  IR PICC PLACEMENT RIGHT >5 YRS INC IMG GUIDE Result Date: 08/04/2024 INDICATION: 89 year old female with recently placed left subclavian approach cardiac rhythm maintenance device. Unfortunately, she requires TPN and therefore IR is consulted for PICC placement. EXAM: PICC LINE PLACEMENT WITH ULTRASOUND AND FLUOROSCOPIC GUIDANCE MEDICATIONS: None. ANESTHESIA/SEDATION: None. FLUOROSCOPY TIME:  Radiation exposure index: 2.9 mGy, air kerma COMPLICATIONS: SIR Level A - No therapy, no consequence. Inadvertent arterial puncture by the APP initially performing the procedure. The complication was recognized immediately and pressure held for 25 minutes. No evidence of hematoma. Physician then took over and completed placement of PICC. PROCEDURE: The patient was advised of the possible risks and complications and agreed to undergo the procedure. The patient was then brought to the angiographic suite for the procedure. The right arm was prepped with chlorhexidine , draped in the usual sterile fashion using  maximum barrier technique (cap and mask, sterile gown, sterile gloves, large sterile sheet, hand hygiene and cutaneous antisepsis) and infiltrated locally with 1% Lidocaine . Ultrasound demonstrated patency of the right brachial vein, and this was documented with an image. Under real-time ultrasound guidance, this vein was accessed with a 21 gauge micropuncture needle and image documentation was performed. A 0.018 wire was introduced in to the vein. Over this, a 5 [place] dual lumen power-injectable PICC was advanced to the lower SVC. Fluoroscopy during the procedure  and fluoro spot radiograph confirms appropriate catheter position. The catheter was flushed and covered with a sterile dressing. Catheter length: 32 cm IMPRESSION: Successful right arm Power PICC line placement with ultrasound and fluoroscopic guidance. The catheter is ready for use. Electronically Signed   By: Wilkie Lent M.D.   On: 08/04/2024 16:44     Echo LVEF 55-60% 11/03/2020  TELEMETRY: Atrial fibrillation 90 bpm:  ASSESSMENT AND PLAN:  Principal Problem:   SBO (small bowel obstruction) (HCC) Active Problems:   HTN (hypertension)   Acquired hypothyroidism   Coronary artery disease with history of NSTEMI   Status cardiac pacemaker 07/24/2024 for sinus pause with syncope   Paroxysmal atrial fibrillation (HCC)   Chronic anticoagulation   Small bowel obstruction (HCC)    1.  Persistent atrial fibrillation with rapid ventricular rate, on IV amiodarone , and metoprolol  tartrate 2.5 mg IV as needed 2.  Small bowel obstruction, status post exploratory laparotomy and reduction of internal hernia, NG tube still in place 3.  Status post dual-chamber pacemaker [facility] [employer] [address] DR EL) 07/24/2024 4.  History of NSTEMI 11/03/2020 with occluded RPL 3, treated medically 5.  Essential hypertension, blood pressure currently low   Recommendations   1.  Agree with current therapy 2.  Continue amiodarone  drip for rate control, transition to amiodarone  200 mg daily when able to take p.o. medications 3.  Continue heparin  drip, transition to Eliquis  when able to take p.o. medications   Marsa Dooms, MD, PhD, FACC 08/06/2024 9:44 AM

## 2024-08-06 NOTE — NC FL2 (Signed)
 [place]  MEDICAID [id number] LEVEL OF CARE FORM     IDENTIFICATION  Patient Name: Maria Trujillo Birthdate: January 07, 1934 Sex: female Admission Date (Current Location): 07/30/2024  [facility] [place] Number:  Chiropodist and Address:         Provider Number: [id number]  Attending Physician Name and Address:  Jens Durand, MD  Relative Name and Phone Number:       Current Level of Care: Hospital Recommended Level of Care: Skilled Nursing Facility Prior Approval Number:    Date Approved/Denied:   PASRR Number: [id number]  Discharge Plan: SNF    Current Diagnoses: Patient Active Problem List   Diagnosis Date Noted   Small bowel obstruction (HCC) 07/31/2024   SBO (small bowel obstruction) (HCC) 07/30/2024   Coronary artery disease with history of NSTEMI 07/30/2024   Status cardiac pacemaker 07/24/2024 for sinus pause with syncope 07/30/2024   Paroxysmal atrial fibrillation (HCC) 07/30/2024   Chronic anticoagulation 07/30/2024   Atrial fibrillation with rapid ventricular response (HCC) 05/23/2024   Hypercoagulable state due to persistent atrial fibrillation (HCC) 06/04/2023   Persistent atrial fibrillation (HCC) 05/24/2023   Acquired hypothyroidism 12/25/2020   Nontraumatic complete tear of right rotator cuff 12/25/2020   NSTEMI (non-ST elevated myocardial infarction) (HCC) 11/03/2020   Primary open angle glaucoma (POAG) of both eyes, severe stage 11/01/2018   Primary osteoarthritis involving multiple joints 02/18/2017   Esophageal reflux 02/15/2017   HTN (hypertension) 02/15/2017   S/P total knee arthroplasty 02/15/2017   Hyperlipidemia, unspecified 03/06/2014   Restless leg syndrome 03/06/2014    Orientation RESPIRATION BLADDER Height & Weight     Self, Time, Situation, Place  Normal Continent Weight: 74.2 kg Height:  5' 4 (162.6 cm)  BEHAVIORAL SYMPTOMS/MOOD NEUROLOGICAL BOWEL NUTRITION STATUS      Continent Diet  AMBULATORY STATUS COMMUNICATION OF NEEDS  Skin   Limited Assist Verbally Surgical wounds, Other (Comment) (Redness)                       Personal Care Assistance Level of Assistance  Bathing, Dressing Bathing Assistance: Limited assistance   Dressing Assistance: Limited assistance     Functional Limitations Info             SPECIAL CARE FACTORS FREQUENCY  PT (By licensed PT), OT (By licensed OT)     PT Frequency: 3 X/Week OT Frequency: 3 X/Week            Contractures Contractures Info: Not present    Additional Factors Info  Code Status Code Status Info: Full             Current Medications (08/06/2024):  This is the current hospital active medication list Current Facility-Administered Medications  Medication Dose Route Frequency Provider Last Rate Last Admin   amiodarone  (NEXTERONE  PREMIX) 360-4.14 MG/200ML-% (1.8 mg/mL) IV infusion  30 mg/hr Intravenous Continuous Perley Lum DEL, RPH 16.67 mL/hr at 08/06/24 0927 30 mg/hr at 08/06/24 9072   Chlorhexidine  Gluconate Cloth 2 % PADS 6 each  6 each Topical Q0600 Jordis Laneta FALCON, MD   6 each at 08/06/24 0535   diphenhydrAMINE  (BENADRYL ) injection 25 mg  25 mg Intravenous Q6H PRN Schulz, Zachary R, PA-C       heparin  ADULT infusion 100 units/mL (25000 units/250mL)  1,150 Units/hr Intravenous Continuous Jens Durand, MD 11.5 mL/hr at 08/06/24 1504 1,150 Units/hr at 08/06/24 1504   hydrALAZINE  (APRESOLINE ) injection 5 mg  5 mg Intravenous Q4H PRN Jordis Laneta FALCON, MD  insulin aspart (novoLOG) injection 0-9 Units  0-9 Units Subcutaneous Q6H Sharilyn Estill HERO, RPH   1 Units at 08/05/24 1813   ketorolac (TORADOL) 15 MG/ML injection 15 mg  15 mg Intravenous Q6H PRN Schulz, Zachary R, PA-C   15 mg at 08/06/24 9090   latanoprost  (XALATAN ) 0.005 % ophthalmic solution 1 drop  1 drop Right Eye QHS Pabon, [place] F, MD   1 drop at 08/05/24 2122   levothyroxine  (SYNTHROID ) tablet 100 mcg  100 mcg Oral QAC breakfast Pabon, [place] F, MD   100 mcg at 08/05/24 9364    metoprolol  tartrate (LOPRESSOR ) injection 2.5 mg  2.5 mg Intravenous Q5 min PRN Jordis Cliche F, MD   1 mg at 08/03/24 9257   morphine  (PF) 2 MG/ML injection 2 mg  2 mg Intravenous Q2H PRN Pabon, Diego F, MD       ondansetron  (ZOFRAN ) tablet 4 mg  4 mg Oral Q6H PRN Pabon, Diego F, MD   4 mg at 08/02/24 1156   Or   ondansetron  (ZOFRAN ) injection 4 mg  4 mg Intravenous Q6H PRN Pabon, Cliche F, MD   4 mg at 08/03/24 0751   pantoprazole  (PROTONIX ) injection 40 mg  40 mg Intravenous Q24H Pabon, Diego F, MD   40 mg at 08/06/24 0853   phenol (CHLORASEPTIC) mouth spray 1 spray  1 spray Mouth/Throat PRN Jens Durand, MD   1 spray at 08/05/24 1259   promethazine  (PHENERGAN ) 12.5 mg in sodium chloride  0.9 % 50 mL IVPB  12.5 mg Intravenous Q6H PRN Jordis Cliche F, MD 150 mL/hr at 08/06/24 0344 Infusion Verify at 08/06/24 0344   thiamine (VITAMIN B1) injection 100 mg  100 mg Intravenous Daily HallajiEstill HERO, RPH   100 mg at 08/06/24 1223   timolol  (TIMOPTIC ) 0.5 % ophthalmic solution 1 drop  1 drop Right Eye Daily Jordis Cliche F, MD   1 drop at 08/06/24 0854   TPN ADULT (ION)   Intravenous Continuous TPN Patel, Kishan S, RPH 35 mL/hr at 08/06/24 9663 Infusion Verify at 08/06/24 0336   TPN ADULT (ION)   Intravenous Continuous TPN Patel, Kishan S, RPH         Discharge Medications: Please see discharge summary for a list of discharge medications.  Relevant Imaging Results:  Relevant Lab Results:   Additional Information    Victory Jackquline RAMAN, RN

## 2024-08-06 NOTE — Plan of Care (Signed)
  Problem: Clinical Measurements: Goal: Ability to maintain clinical measurements within normal limits will improve Outcome: Progressing   Problem: Activity: Goal: Risk for activity intolerance will decrease Outcome: Progressing   Problem: Elimination: Goal: Will not experience complications related to bowel motility Outcome: Progressing   Problem: Clinical Measurements: Goal: Cardiovascular complication will be avoided Outcome: Progressing

## 2024-08-06 NOTE — Progress Notes (Signed)
 PHARMACY - ANTICOAGULATION CONSULT NOTE  Pharmacy Consult for Heparin  infusion  Indication: atrial fibrillation  Allergies  Allergen Reactions   Celecoxib Hives   Dorzolamide Shortness Of Breath    Other Reaction(s): Other (See Comments)  Pt stated had reactions but unable to provide information   Penicillins Other (See Comments), Hives and Rash    Redness (sunburn type rash with peeling)  Has patient had a PCN reaction causing immediate rash, facial/tongue/throat swelling, SOB or lightheadedness with hypotension: No  Has patient had a PCN reaction causing severe rash involving mucus membranes or skin necrosis: No  Has patient had a PCN reaction that required hospitalization No  Has patient had a PCN reaction occurring within the last 10 years: Yes  If all of the above answers are NO, then may proceed with Cephalosporin use.  Other Reaction(s): Other (See Comments)  Redness (sunburn type rash with peeling)  Has patient had a PCN reaction causing immediate rash, facial/tongue/throat swelling, SOB or lightheadedness with hypotension: No  Has patient had a PCN reaction causing severe rash involving mucus membranes or skin necrosis: No  Has patient had a PCN reaction that required hospitalization No  Has patient had a PCN reaction occurring within the last 10 years: Yes  If all of the above answers are NO, then may proceed with Cephalosporin use.  Redness and flushing  Redness and flushing  Redness (sunburn type rash with peeling)   Shellfish Allergy Hives and Itching    All seafood   Timolol  Maleate Other (See Comments) and Shortness Of Breath    Redness  Other Reaction(s): Other (See Comments)  Shortness of breath  Redness  Redness  Higher Doses  Shortness of breath    Redness   Sulfa Antibiotics Other (See Comments)    Redness in eyes   Amoxicillin Rash   Amoxicillin-Pot Clavulanate Other (See Comments)    Redness and flushing   Brimonidine Other (See  Comments)    blisters   Cefuroxime Axetil Other (See Comments)    redness   Dipivefrin Other (See Comments)    sleepiness   Oxycodone      BP dropped  Other Reaction(s): Other (See Comments)  Dropped BP down too low  Dropped blood pressure   BP dropped  Dropped BP down too low    Patient Measurements: Height: 5' 4 (162.6 cm) Weight: 74.9 kg (165 lb 2 oz) IBW/kg (Calculated) : 54.7 HEPARIN  DW (KG): 70.2  Vital Signs: Temp: 98.1 F (36.7 C) (10/11 2304) Temp Source: Oral (10/11 2304) BP: 92/58 (10/11 2304) Pulse Rate: 98 (10/11 2304)  Labs: Recent Labs    08/03/24 0330 08/04/24 0504 08/04/24 1328 08/05/24 0351 08/06/24 0134 08/06/24 0137  HGB 13.0  --   --  11.9*  --  11.1*  HCT 38.9  --   --  35.2*  --  32.2*  PLT 167  --   --  137*  --  134*  APTT  --   --  40*  --   --   --   LABPROT  --   --  17.8*  --   --   --   INR  --   --  1.4*  --   --   --   HEPARINUNFRC  --   --  <0.10*  --  0.24*  --   CREATININE 0.82 0.83  --  0.78 0.62  --     Estimated Creatinine Clearance: 48.2 mL/min (by C-G formula based on SCr of  0.62 mg/dL).   Medical History: Past Medical History:  Diagnosis Date   Arthritis    Atrial fibrillation (HCC)    History of kidney stones    Hypothyroidism    Myocardial infarct (HCC)    PONV (postoperative nausea and vomiting)     Medications:  Medications Prior to Admission  Medication Sig Dispense Refill Last Dose/Taking   acetaminophen  (TYLENOL ) 650 MG CR tablet Take 650 mg by mouth daily as needed for pain.   Taking As Needed   amiodarone  (PACERONE ) 200 MG tablet Take 2 tablets (400 mg total) by mouth 2 (two) times daily for 10 days, THEN 1 tablet (200 mg total) daily. (Patient taking differently:  (200 mg total) daily.) 70 tablet 0 07/30/2024 Morning   apixaban  (ELIQUIS ) 5 MG TABS tablet Take 1 tablet (5 mg total) by mouth 2 (two) times daily. 180 tablet 3 07/30/2024 Morning   atorvastatin  (LIPITOR ) 40 MG tablet Take 40 mg by mouth  at bedtime.   07/29/2024 Bedtime   azelastine  (ASTELIN ) 0.1 % nasal spray Place 1 spray into both nostrils daily.   07/30/2024 Morning   bimatoprost (LUMIGAN) 0.03 % ophthalmic solution Place 1 drop into the right eye at bedtime.   07/29/2024 Bedtime   Calcium  Carb-Cholecalciferol  (CALCIUM  CARBONATE-VITAMIN D3) 600-400 MG-UNIT TABS Take 1 tablet by mouth daily.   07/30/2024 Morning   Carboxymethylcellulose Sodium (THERATEARS) 0.25 % SOLN Place 1 drop into both eyes daily as needed (Dry eyes).   Taking As Needed   Cholecalciferol  (VITAMIN D ) 50 MCG (2000 UT) tablet Take 2,000 Units by mouth daily.   07/30/2024 Morning   Docusate Sodium  100 MG capsule Take 100 mg by mouth daily as needed for mild constipation or moderate constipation.   Taking As Needed   fexofenadine (ALLEGRA) 180 MG tablet Take 180 mg by mouth at bedtime.   07/29/2024 Bedtime   folic acid (FOLVITE) 400 MCG tablet Take 400 mcg by mouth daily.   Taking   ipratropium (ATROVENT) 0.06 % nasal spray Place 1 spray into both nostrils daily as needed for rhinitis.   Taking As Needed   isosorbide  mononitrate (IMDUR ) 30 MG 24 hr tablet Take 1 tablet (30 mg total) by mouth daily. 30 tablet 2 07/30/2024 Morning   levothyroxine  (SYNTHROID , LEVOTHROID) 100 MCG tablet Take 100 mcg by mouth daily before breakfast.   07/30/2024 Morning   lisinopril  (ZESTRIL ) 5 MG tablet Take 1 tablet (5 mg total) by mouth daily. 30 tablet 2 07/30/2024 Morning   LUMIGAN 0.01 % SOLN Place 1 drop into the right eye at bedtime.   Taking   metoprolol  tartrate (LOPRESSOR ) 25 MG tablet Take 0.5 tablets (12.5 mg total) by mouth daily as needed. for heart rate over 100.  Home med. 10 tablet 1 Unknown   Multiple Vitamin (MULTIVITAMIN WITH MINERALS) TABS tablet Take 1 tablet by mouth daily.   07/30/2024 Morning   Multiple Vitamins-Minerals (PRESERVISION AREDS 2 PO) Take 2 tablets by mouth 2 (two) times daily.   Taking   pantoprazole  (PROTONIX ) 40 MG tablet Take 40 mg by mouth daily.    07/30/2024 Morning   prednisoLONE acetate (PRED FORTE) 1 % ophthalmic suspension Place 1 drop into the right eye 4 (four) times daily.   Taking   timolol  (TIMOPTIC ) 0.5 % ophthalmic solution Place 1 drop into the right eye in the morning.   07/30/2024 Morning   vitamin B-12 (CYANOCOBALAMIN ) 100 MCG tablet Take 100 mcg by mouth daily.   07/30/2024 Morning   furosemide  (LASIX )  20 MG tablet Take 20 mg by mouth daily. prn (Patient not taking: Reported on 07/21/2024)       Assessment: 89 yo female with PMH of A. Fib, HTN, HLD, NSTEMI, and CAD admitted with small bowel obstruction s/p exploratory laparotomy and resection on 10/9. Patient remains NPO, TPN starting 10/10. Pharmacy has been consulted for heparin  infusion monitoring and dosing for A. Fib. Last apixaban  dose reported 10/5.  Update 10/10 following PICC placement -- brachial artery was struck, hemostasis now achieved. Heparin  will now be restarted 12 hours after procedure.  Goal of Therapy:  Heparin  level 0.3-0.7 units/ml Monitor platelets by anticoagulation protocol: Yes   Plan:  10/12: HL @ 0134 = 0.24, SUBtherapeutic - will order heparin  1050 units IV X 1 bolus and increase drip rate to 1150 units/hr - recheck HL 8 hrs after rate change  - CBC daily   Jaclin Finks D Clinical Pharmacist 08/06/2024 3:17 AM

## 2024-08-06 NOTE — Progress Notes (Signed)
 Daily Progress Note   Patient Name: Maria Trujillo       Date: 08/06/2024 DOB: 1933-12-31  Age: 89 y.o. MRN#: [id number] Attending Physician: Jens Durand, MD Primary Care Physician: Jeffie Cheryl BRAVO, MD Admit Date: 07/30/2024  Reason for Consultation/Follow-up: Establishing goals of care  HPI/Brief Hospital Review: 89 y.o. female  with past medical history of CAD, history of NSTEMI, hypertension, hyperlipidemia, A-fib on Eliquis  and amiodarone , complications status post cardioversion with sinus pauses and significant bradycardia with syncope in July 2025 now s/p PPM on 07/24/2024 admitted from home on 07/30/2024 with significant abdominal pain with associated nausea and vomiting.   In ED found to have small bowel obstruction with suspected transition point in the right lower quadrant and mesenteric edema and free fluid   Admitted for small bowel obstruction, general surgery consulted, NG tube placed to suction, s/p exploratory lap with small bowel resection and hernia repair on 10/9 NG tube remains and has been started on TPN via PICC line   Palliative medicine was consulted for assisting with goals of care conversations.  Subjective: Extensive chart review has been completed prior to meeting patient including labs, vital signs, imaging, progress notes, orders, and available advanced directive documents from current and previous encounters.    Visited with Maria Trujillo at her bedside.  She is awake, alert and able to engage in conversation.  She reports feeling very well today without acute complaints of pain or discomfort.  Daughter at bedside during time of visit.  Maria Trujillo reports being able to tolerate short ambulation around the room and was able to sit up in recliner for several hours.   Hopeful to ambulate more and sit in recliner later today.  She reports passing gas throughout the day.  Plan remains for continued improvement with goal to discharge to short-term rehab with long-term goal of returning home and functioning independently.  Answered and addressed all questions and concerns.  PMT to step away from daily visits as plan and goals remain clear.  Objective:  Physical Exam Constitutional:      General: She is not in acute distress.    Appearance: She is not ill-appearing.  HENT:     Head: Normocephalic.  Pulmonary:     Effort: Pulmonary effort is normal. No respiratory distress.  Skin:    General:  Skin is warm and dry.  Neurological:     Mental Status: She is alert and oriented to person, place, and time.     Motor: Weakness present.  Psychiatric:        Mood and Affect: Mood normal.        Behavior: Behavior normal.        Thought Content: Thought content normal.             Vital Signs: BP (!) 99/51 (BP Location: Left Arm)   Pulse (!) 105   Temp 98 F (36.7 C)   Resp 20   Ht 5' 4 (1.626 m)   Wt 74.2 kg   SpO2 96%   BMI 28.08 kg/m  SpO2: SpO2: 96 % O2 Device: O2 Device: Room Air O2 Flow Rate: O2 Flow Rate (L/min): 2.5 L/min   Palliative Care Assessment & Plan   Assessment/Recommendation/Plan  Continue with current plan of care  Thank you for allowing the Palliative Medicine Team to assist in the care of this patient.  Visit includes: Detailed review of medical records (labs, imaging, vital signs), medically appropriate exam (mental status, respiratory, cardiac, skin), discussed with treatment team, counseling and educating patient, family and staff, documenting clinical information, medication management and coordination of care.  Waddell Lesches, DNP, AGNP-C Palliative Medicine   Please contact Palliative Medicine Team phone at [phone] for questions and concerns.

## 2024-08-06 NOTE — Progress Notes (Signed)
 PHARMACY - TOTAL PARENTERAL NUTRITION CONSULT NOTE   Indication: Small bowel obstruction  Patient Measurements: Height: 5' 4 (162.6 cm) Weight: 74.2 kg (163 lb 9.3 oz) IBW/kg (Calculated) : 54.7 TPN AdjBW (KG): 59.6 Body mass index is 28.08 kg/m. Usual Weight: UNK  Assessment: 89yo female admitted with intractable vomiting and small bowel obstruction. Patient has PMH cholecystectomy, CAD, NSTEMI, HTN, HLD, A. Fib, and recent pacemaker. She has been NPO/clear liquids since admission. Patient reports Hives allergy to Shellfish. Allergy was confirmed with family to be to ALL seafood.   Glucose / Insulin: BG 90-140s > 100-110 (2 units of SSI used in the last 24 hours.  Electrolytes: WNL Renal: Scr <1 Hepatic: LFTs WNL Intake / Output; MIVF: D5/1/2NS w/KCL @ 100 ml/hr - Stop time 10/9 @ 1714 GI Imaging:  10/8 CT/ABDOMEN PELVIS Complex small bowel obstruction with 2 points of obstruction  GI Surgeries / Procedures: S/P exploratory laparotomy, small bowel resection, reduction of internal hernia, and repair or ventral hernia on 10/9  Central access: PICC order Placed 10/10 AM TPN start date: 08/04/2024  Nutritional Goals: Goal TPN rate is 70 mL/hr (provides 84g of protein and 1696 kcals per day)  RD Assessment: Estimated Needs Total Energy Estimated Needs: 1600-1800kcal/day Total Protein Estimated Needs: 80-90g/day Total Fluid Estimated Needs: 1.4-1.6L/day  Current Nutrition:  NPO,   Plan:  Increase TPN to goal rate of 70 mL/hr at 1800.  Electrolytes in TPN: Na 53mEq/L, K 78mEq/L, Ca 40mEq/L, Mg 13mEq/L, and Phos 15mmol/L. Cl:Ac 1:1 Due to allergy to ALL Seafoods- INTRALIPIDS used instead of SMOFLIPIDS Add standard MVI and trace elements to TPN Thiamine 100mg  IV x 7 days ordered outside of bag.  Initiate Sensitive q6h SSI and adjust as needed  Monitor TPN labs on Mon/Thurs. BMP, Phos, and Mag monitored daily until stable.   Cathaleen GORMAN Blanch, PharmD, BCPS Clinical  Pharmacist 08/06/2024 8:56 AM

## 2024-08-07 DIAGNOSIS — K56609 Unspecified intestinal obstruction, unspecified as to partial versus complete obstruction: Secondary | ICD-10-CM | POA: Diagnosis not present

## 2024-08-07 LAB — CBC
HCT: 28.2 % — ABNORMAL LOW (ref 36.0–46.0)
Hemoglobin: 9.5 g/dL — ABNORMAL LOW (ref 12.0–15.0)
MCH: 30.7 pg (ref 26.0–34.0)
MCHC: 33.7 g/dL (ref 30.0–36.0)
MCV: 91.3 fL (ref 80.0–100.0)
Platelets: 153 K/uL (ref 150–400)
RBC: 3.09 MIL/uL — ABNORMAL LOW (ref 3.87–5.11)
RDW: 14 % (ref 11.5–15.5)
WBC: 9 K/uL (ref 4.0–10.5)
nRBC: 0 % (ref 0.0–0.2)

## 2024-08-07 LAB — COMPREHENSIVE METABOLIC PANEL WITH GFR
ALT: 28 U/L (ref 0–44)
AST: 28 U/L (ref 15–41)
Albumin: 2.1 g/dL — ABNORMAL LOW (ref 3.5–5.0)
Alkaline Phosphatase: 55 U/L (ref 38–126)
Anion gap: 9 (ref 5–15)
BUN: 30 mg/dL — ABNORMAL HIGH (ref 8–23)
CO2: 23 mmol/L (ref 22–32)
Calcium: 8.7 mg/dL — ABNORMAL LOW (ref 8.9–10.3)
Chloride: 107 mmol/L (ref 98–111)
Creatinine, Ser: 0.64 mg/dL (ref 0.44–1.00)
GFR, Estimated: >60 mL/min (ref >60)
Glucose, Bld: 117 mg/dL — ABNORMAL HIGH (ref 70–99)
Potassium: 4.1 mmol/L (ref 3.5–5.1)
Sodium: 139 mmol/L (ref 135–145)
Total Bilirubin: 0.5 mg/dL (ref 0.0–1.2)
Total Protein: 4.8 g/dL — ABNORMAL LOW (ref 6.5–8.1)

## 2024-08-07 LAB — MAGNESIUM: Magnesium: 2.2 mg/dL (ref 1.7–2.4)

## 2024-08-07 LAB — HEPARIN LEVEL (UNFRACTIONATED): Heparin Unfractionated: 0.29 [IU]/mL — ABNORMAL LOW (ref 0.30–0.70)

## 2024-08-07 LAB — GLUCOSE, CAPILLARY
Glucose-Capillary: 101 mg/dL — ABNORMAL HIGH (ref 70–99)
Glucose-Capillary: 114 mg/dL — ABNORMAL HIGH (ref 70–99)
Glucose-Capillary: 118 mg/dL — ABNORMAL HIGH (ref 70–99)
Glucose-Capillary: 125 mg/dL — ABNORMAL HIGH (ref 70–99)
Glucose-Capillary: 129 mg/dL — ABNORMAL HIGH (ref 70–99)

## 2024-08-07 LAB — PHOSPHORUS: Phosphorus: 2.6 mg/dL (ref 2.5–4.6)

## 2024-08-07 LAB — TRIGLYCERIDES: Triglycerides: 143 mg/dL (ref <150)

## 2024-08-07 MED ORDER — ENSURE PLUS HIGH PROTEIN PO LIQD
237.0000 mL | Freq: Three times a day (TID) | ORAL | Status: DC
  Administered 2024-08-07 – 2024-08-12 (×4): 237 mL via ORAL

## 2024-08-07 MED ORDER — TRAVASOL 10 % IV SOLN
INTRAVENOUS | Status: AC
  Filled 2024-08-07: qty 840

## 2024-08-07 MED ORDER — HEPARIN BOLUS VIA INFUSION
1350.0000 [IU] | Freq: Once | INTRAVENOUS | Status: AC
  Administered 2024-08-07: 1350 [IU] via INTRAVENOUS
  Filled 2024-08-07: qty 1350

## 2024-08-07 MED ORDER — APIXABAN 5 MG PO TABS
5.0000 mg | ORAL_TABLET | Freq: Two times a day (BID) | ORAL | Status: DC
  Administered 2024-08-07 (×2): 5 mg via ORAL
  Filled 2024-08-07 (×2): qty 1

## 2024-08-07 MED ORDER — INSULIN ASPART 100 UNIT/ML IJ SOLN
0.0000 [IU] | Freq: Three times a day (TID) | INTRAMUSCULAR | Status: DC
  Administered 2024-08-08: 2 [IU] via SUBCUTANEOUS
  Administered 2024-08-08: 1 [IU] via SUBCUTANEOUS
  Filled 2024-08-07 (×2): qty 1

## 2024-08-07 NOTE — Progress Notes (Signed)
 Nutrition Follow Up Note   DOCUMENTATION CODES:   Not applicable  INTERVENTION:   Continue TPN per pharmacy- provides 1696kcal/day and 84g/day protein   Continue thiamine 100mg  IV daily x 5-7 days   Ensure Plus High Protein po TID, each supplement provides 350 kcal and 20 grams of protein  MVI po once TPN discontinued   Daily weights   NUTRITION DIAGNOSIS:   Inadequate oral intake related to altered GI function as evidenced by NPO status. -resolving   GOAL:   Patient will meet greater than or equal to 90% of their needs -met   MONITOR:   PO intake, Supplement acceptance, Diet advancement, Labs, Weight trends, Skin, I & O's, TPN  ASSESSMENT:   89 y/o female with h/o HTN, CAD, NSTEMI, MI, cholecystectomy, hypothyroidism, bradycardia/syncope s/p pacemaker (9/29), Afib s/p cardioversion (2024), GERD, HLD, RLS and kidney stones who is admitted with SBO now s/p exploratory laparotomy, reduction of internal hernia, small bowel resection (~15cm) with primary anastomosis and repair ventral hernia 10/9.  Pt initiated on a clear liquid diet this morning and was advanced to a full liquid diet this afternoon. RD will add Ensure to help pt meet her estimated needs. Pt continues to tolerate TPN well at goal rate. Refeed labs stable. NGT with 50ml out overnight. No BM yet. Per chart, pt is down 4lbs since admission.    Medications reviewed and include: insulin, synthroid , protonix , thiamine, TPN  Labs reviewed: K 4.1 wnl, BUN 30(H), P 2.6 wnl, Mg 2.2 wnl Triglycerides- 143 Hgb 9.5(L), Hct 28.2(L) Cbgs- 101, 118, 125 x 24 hrs   UOP-   Diet Order:    Diet Order             Diet full liquid Room service appropriate? Yes; Fluid consistency: Thin  Diet effective 1400                  EDUCATION NEEDS:   Education needs have been addressed  Skin:  Skin Assessment: Reviewed RN Assessment (incision abdomen)  Last BM:  10/5  Height:   Ht Readings from Last 1  Encounters:  07/30/24 5' 4 (1.626 m)    Weight:   Wt Readings from Last 1 Encounters:  08/07/24 72.3 kg    Ideal Body Weight:  54.5 kg  BMI:  Body mass index is 27.36 kg/m.  Estimated Nutritional Needs:   Kcal:  1600-1800kcal/day  Protein:  80-90g/day  Fluid:  1.4-1.6L/day  Augustin Shams MS, RD, LDN If unable to be reached, please send secure chat to RD inpatient available from 8:00a-4:00p daily

## 2024-08-07 NOTE — Progress Notes (Signed)
 [facility] SURGICAL PROGRESS NOTE  Hospital Day(s): 7.   Post op day(s): 4 Days Post-Op.   Interval History:  Patient seen and examined No acute events or new complaints overnight.  Patient reports she is feeling good Abdomen soreness expected No fever, chills, nausea, emesis  Previous leukocytosis now resolved; WBC 9.0K Hgb to 9.5; suspect dilutional  Renal function normal; sCr - 0.64; UO - 275 ccs + unmeasured amounts  No electrolyte derangements NGT output 50 ccs She is passing flatus and had BM  Vital signs in last 24 hours: [min-max] current  Temp:  [97.5 F (36.4 C)-98.2 F (36.8 C)] 97.6 F (36.4 C) (10/13 0719) Pulse Rate:  [91-107] 105 (10/13 0719) Resp:  [16-20] 16 (10/13 0719) BP: (99-119)/(51-77) 119/63 (10/13 0719) SpO2:  [90 %-97 %] 95 % (10/13 0719) Weight:  [72.3 kg] 72.3 kg (10/13 0500)     Height: 5' 4 (162.6 cm) Weight: 72.3 kg BMI (Calculated): 27.35   Intake/Output last 2 shifts:  10/12 0701 - 10/13 0700 In: 2065.9 [I.V.:2065.9] Out: 425 [Urine:275; Emesis/NG output:50]   Physical Exam:  Constitutional: alert, cooperative and no distress  HEENT: NGT in place (removed) Respiratory: breathing non-labored at rest  Cardiovascular: regular rate and irregular Gastrointestinal: soft, incisional soreness, and non-distended. No rebound/guarding Integumentary: Laparotomy is CDI with staples, no erythema   Labs:     Latest Ref Rng & Units 08/07/2024    5:19 AM 08/06/2024    1:37 AM 08/05/2024    3:51 AM  CBC  WBC 4.0 - 10.5 K/uL 9.0  12.7  14.7   Hemoglobin 12.0 - 15.0 g/dL 9.5  88.8  88.0   Hematocrit 36.0 - 46.0 % 28.2  32.2  35.2   Platelets 150 - 400 K/uL 153  134  137       Latest Ref Rng & Units 08/07/2024    5:19 AM 08/06/2024    1:34 AM 08/05/2024    3:51 AM  CMP  Glucose 70 - 99 mg/dL 882  894  880   BUN 8 - 23 mg/dL 30  27  31    Creatinine 0.44 - 1.00 mg/dL 9.35  9.37  9.21   Sodium 135 - 145 mmol/L 139  134  132    Potassium 3.5 - 5.1 mmol/L 4.1  4.1  4.0   Chloride 98 - 111 mmol/L 107  104  102   CO2 22 - 32 mmol/L 23  21  19    Calcium  8.9 - 10.3 mg/dL 8.7  8.6  8.6   Total Protein 6.5 - 8.1 g/dL 4.8     Total Bilirubin 0.0 - 1.2 mg/dL 0.5     Alkaline Phos 38 - 126 U/L 55     AST 15 - 41 U/L 28     ALT 0 - 44 U/L 28        Imaging studies: No new pertinent imaging studies   Assessment/Plan: 89 y.o. female 4 Days Post-Op s/p exploratory laparotomy, reduction of internal hernia, SBR, and repair of ventral hernia    - NGT removed this morning  - Okay for CLD  - Continue TPN for now; anticipate weaning in next 24-48 hours  - Appreciate cardiology assistance; okay for pO medications   - Monitor abdominal examination; on-going bowel function   - Pain control prn; antiemetics prn  - Mobilize; Okay to work with PT   - Further management per primary service; we will follow    All of the above findings  and recommendations were discussed with the patient, patient's family (daughter at bedside), and the medical team, and all of patient's and family's questions were answered to their expressed satisfaction.  -- Arthea Platt, PA-C  [facility] 08/07/2024, 10:19 AM M-F: 7am - 4pm

## 2024-08-07 NOTE — Progress Notes (Signed)
 PHARMACY - ANTICOAGULATION CONSULT NOTE  Pharmacy Consult for Heparin  infusion  Indication: atrial fibrillation  Allergies  Allergen Reactions   Celecoxib Hives   Dorzolamide Shortness Of Breath    Other Reaction(s): Other (See Comments)  Pt stated had reactions but unable to provide information   Penicillins Other (See Comments), Hives and Rash    Redness (sunburn type rash with peeling)  Has patient had a PCN reaction causing immediate rash, facial/tongue/throat swelling, SOB or lightheadedness with hypotension: No  Has patient had a PCN reaction causing severe rash involving mucus membranes or skin necrosis: No  Has patient had a PCN reaction that required hospitalization No  Has patient had a PCN reaction occurring within the last 10 years: Yes  If all of the above answers are NO, then may proceed with Cephalosporin use.  Other Reaction(s): Other (See Comments)  Redness (sunburn type rash with peeling)  Has patient had a PCN reaction causing immediate rash, facial/tongue/throat swelling, SOB or lightheadedness with hypotension: No  Has patient had a PCN reaction causing severe rash involving mucus membranes or skin necrosis: No  Has patient had a PCN reaction that required hospitalization No  Has patient had a PCN reaction occurring within the last 10 years: Yes  If all of the above answers are NO, then may proceed with Cephalosporin use.  Redness and flushing  Redness and flushing  Redness (sunburn type rash with peeling)   Shellfish Allergy Hives and Itching    All seafood   Timolol  Maleate Other (See Comments) and Shortness Of Breath    Redness  Other Reaction(s): Other (See Comments)  Shortness of breath  Redness  Redness  Higher Doses  Shortness of breath    Redness   Sulfa Antibiotics Other (See Comments)    Redness in eyes   Amoxicillin Rash   Amoxicillin-Pot Clavulanate Other (See Comments)    Redness and flushing   Brimonidine Other (See  Comments)    blisters   Cefuroxime Axetil Other (See Comments)    redness   Dipivefrin Other (See Comments)    sleepiness   Oxycodone      BP dropped  Other Reaction(s): Other (See Comments)  Dropped BP down too low  Dropped blood pressure   BP dropped  Dropped BP down too low    Patient Measurements: Height: 5' 4 (162.6 cm) Weight: 72.3 kg (159 lb 6.3 oz) IBW/kg (Calculated) : 54.7 HEPARIN  DW (KG): 70.2  Vital Signs: Temp: 98 F (36.7 C) (10/13 0311) Temp Source: Oral (10/12 1928) BP: 119/76 (10/13 0311) Pulse Rate: 93 (10/13 0311)  Labs: Recent Labs    08/04/24 1328 08/04/24 1328 08/05/24 0351 08/06/24 0134 08/06/24 0137 08/06/24 1149 08/07/24 0519  HGB  --    < > 11.9*  --  11.1*  --  9.5*  HCT  --   --  35.2*  --  32.2*  --  28.2*  PLT  --   --  137*  --  134*  --  153  APTT 40*  --   --   --   --   --   --   LABPROT 17.8*  --   --   --   --   --   --   INR 1.4*  --   --   --   --   --   --   HEPARINUNFRC <0.10*  --   --  0.24*  --  0.46 0.29*  CREATININE  --   --  0.78 0.62  --   --   --    < > = values in this interval not displayed.    Estimated Creatinine Clearance: 47.3 mL/min (by C-G formula based on SCr of 0.62 mg/dL).   Medical History: Past Medical History:  Diagnosis Date   Arthritis    Atrial fibrillation (HCC)    History of kidney stones    Hypothyroidism    Myocardial infarct (HCC)    PONV (postoperative nausea and vomiting)     Medications:  Medications Prior to Admission  Medication Sig Dispense Refill Last Dose/Taking   acetaminophen  (TYLENOL ) 650 MG CR tablet Take 650 mg by mouth daily as needed for pain.   Taking As Needed   amiodarone  (PACERONE ) 200 MG tablet Take 2 tablets (400 mg total) by mouth 2 (two) times daily for 10 days, THEN 1 tablet (200 mg total) daily. (Patient taking differently:  (200 mg total) daily.) 70 tablet 0 07/30/2024 Morning   apixaban  (ELIQUIS ) 5 MG TABS tablet Take 1 tablet (5 mg total) by mouth 2  (two) times daily. 180 tablet 3 07/30/2024 Morning   atorvastatin  (LIPITOR ) 40 MG tablet Take 40 mg by mouth at bedtime.   07/29/2024 Bedtime   azelastine  (ASTELIN ) 0.1 % nasal spray Place 1 spray into both nostrils daily.   07/30/2024 Morning   bimatoprost (LUMIGAN) 0.03 % ophthalmic solution Place 1 drop into the right eye at bedtime.   07/29/2024 Bedtime   Calcium  Carb-Cholecalciferol  (CALCIUM  CARBONATE-VITAMIN D3) 600-400 MG-UNIT TABS Take 1 tablet by mouth daily.   07/30/2024 Morning   Carboxymethylcellulose Sodium (THERATEARS) 0.25 % SOLN Place 1 drop into both eyes daily as needed (Dry eyes).   Taking As Needed   Cholecalciferol  (VITAMIN D ) 50 MCG (2000 UT) tablet Take 2,000 Units by mouth daily.   07/30/2024 Morning   Docusate Sodium  100 MG capsule Take 100 mg by mouth daily as needed for mild constipation or moderate constipation.   Taking As Needed   fexofenadine (ALLEGRA) 180 MG tablet Take 180 mg by mouth at bedtime.   07/29/2024 Bedtime   folic acid (FOLVITE) 400 MCG tablet Take 400 mcg by mouth daily.   Taking   ipratropium (ATROVENT) 0.06 % nasal spray Place 1 spray into both nostrils daily as needed for rhinitis.   Taking As Needed   isosorbide  mononitrate (IMDUR ) 30 MG 24 hr tablet Take 1 tablet (30 mg total) by mouth daily. 30 tablet 2 07/30/2024 Morning   levothyroxine  (SYNTHROID , LEVOTHROID) 100 MCG tablet Take 100 mcg by mouth daily before breakfast.   07/30/2024 Morning   lisinopril  (ZESTRIL ) 5 MG tablet Take 1 tablet (5 mg total) by mouth daily. 30 tablet 2 07/30/2024 Morning   LUMIGAN 0.01 % SOLN Place 1 drop into the right eye at bedtime.   Taking   metoprolol  tartrate (LOPRESSOR ) 25 MG tablet Take 0.5 tablets (12.5 mg total) by mouth daily as needed. for heart rate over 100.  Home med. 10 tablet 1 Unknown   Multiple Vitamin (MULTIVITAMIN WITH MINERALS) TABS tablet Take 1 tablet by mouth daily.   07/30/2024 Morning   Multiple Vitamins-Minerals (PRESERVISION AREDS 2 PO) Take 2 tablets  by mouth 2 (two) times daily.   Taking   pantoprazole  (PROTONIX ) 40 MG tablet Take 40 mg by mouth daily.   07/30/2024 Morning   prednisoLONE acetate (PRED FORTE) 1 % ophthalmic suspension Place 1 drop into the right eye 4 (four) times daily.   Taking   timolol  (TIMOPTIC ) 0.5 % ophthalmic  solution Place 1 drop into the right eye in the morning.   07/30/2024 Morning   vitamin B-12 (CYANOCOBALAMIN ) 100 MCG tablet Take 100 mcg by mouth daily.   07/30/2024 Morning   furosemide  (LASIX ) 20 MG tablet Take 20 mg by mouth daily. prn (Patient not taking: Reported on 07/21/2024)       Assessment: 89 yo female with PMH of A. Fib, HTN, HLD, NSTEMI, and CAD admitted with small bowel obstruction s/p exploratory laparotomy and resection on 10/9. Patient remains NPO, TPN starting 10/10. Pharmacy has been consulted for heparin  infusion monitoring and dosing for A. Fib. Last apixaban  dose reported 10/5.  Update 10/10 following PICC placement -- brachial artery was struck, hemostasis now achieved. Heparin  will now be restarted 12 hours after procedure.  1012 1149 HL 0.46 1013 0519 HL 0.29, SUBtherapeutic   Goal of Therapy:  Heparin  level 0.3-0.7 units/ml Monitor platelets by anticoagulation protocol: Yes   Plan:  10/13:  HL @ 0519 = 0.29, SUBtherapeutic - Will order heparin  1350 units IV X 1 bolus and increase drip rate to 1300 units/hr - recheck HL 8 hrs after rate change - CBC daily   Leilyn Frayre D, PharmD Clinical Pharmacist 08/07/2024 7:12 AM

## 2024-08-07 NOTE — Progress Notes (Addendum)
 Progress Note    Maria Trujillo  FMW:[id number] DOB: Jun 08, 1934  DOA: 07/30/2024 PCP: Jeffie Cheryl BRAVO, MD      Brief Narrative:    Medical records reviewed and are as summarized below:  Maria Trujillo is a 89 y.o. female with medical history significant for CAD with prior NSTEMI, HTN, HLD, persistent A-fib on Eliquis  and amiodarone , complicated by post conversion pauses/bradycardia with syncope 04/2024 s/p pacemaker on 07/24/2024 -(Eliquis  held 9/26 to 9/30).  She presented to the hospital because of vomiting and abdominal pain.  She was admitted to the hospital for small bowel obstruction.  NG tube was placed and general surgery was consulted.  NG tube was placed for gastric decompression.  NG tube was subsequently removed and patient was started on clear liquid diet.  However, she failed Gastrografin challenge.  She was taken to the OR for exploratory laparotomy with reduction of internal hernia and resection of small bowel with primary anastomosis and lysis of adhesion on 08/03/2024.   Assessment/Plan:   Principal Problem:   SBO (small bowel obstruction) (HCC) Active Problems:   Coronary artery disease with history of NSTEMI   Status cardiac pacemaker 07/24/2024 for sinus pause with syncope   Paroxysmal atrial fibrillation (HCC)   Chronic anticoagulation   HTN (hypertension)   Acquired hypothyroidism   Small bowel obstruction (HCC)   Nutrition Problem: Inadequate oral intake Etiology: altered GI function  Signs/Symptoms: NPO status   Body mass index is 27.36 kg/m.   Small bowel obstruction: Initially failed conservative management.  S/p ex lap, reduction of internal hernia, small bowel resection with primary anastomosis and repair of ventral hernia on 08/03/2024. She is doing better.  Leukocytosis has improved.  She had a bowel movement today.  NG tube has been removed and she has been started on clear liquid diet. She will remain on TPN for now.   Paroxysmal atrial  fibrillation with RVR: She is still on IV amiodarone  drip and heparin  drip.  Plan to switch to oral amiodarone  home if she tolerates clear liquid diet.  Monitor heparin  level per protocol.  Follow-up with cardiologist.   Eliquis  on hold.   CAD with previous NSTEMI, s/p cardiac pacemaker on 07/24/2024 for sinus pause with syncope   Mild hyponatremia: Improved   Comorbidities include hypertension, hypothyroidism   Diet Order             Diet clear liquid Fluid consistency: Thin  Diet effective now                                  Consultants: Cardiologist General Surgeon  Procedures:  S/p ex lap, reduction of internal hernia, small bowel resection with primary anastomosis and repair of ventral hernia on 08/03/2024. PICC line placement in the right brachial vein on 08/04/2024 by IR    Medications:    Chlorhexidine  Gluconate Cloth  6 each Topical Q0600   insulin aspart  0-9 Units Subcutaneous Q6H   latanoprost   1 drop Right Eye QHS   levothyroxine   100 mcg Oral QAC breakfast   pantoprazole  (PROTONIX ) IV  40 mg Intravenous Q24H   thiamine (VITAMIN B1) injection  100 mg Intravenous Daily   timolol   1 drop Right Eye Daily   Continuous Infusions:  amiodarone  30 mg/hr (08/07/24 0927)   heparin  1,300 Units/hr (08/07/24 0817)   promethazine  (PHENERGAN ) injection (IM or IVPB) 150 mL/hr at 08/07/24 0547  TPN ADULT (ION) 70 mL/hr at 08/07/24 0547   TPN ADULT (ION)       Anti-infectives (From admission, onward)    Start     Dose/Rate Route Frequency Ordered Stop   08/03/24 2000  ciprofloxacin (CIPRO) IVPB 400 mg        400 mg 200 mL/hr over 60 Minutes Intravenous Every 12 hours 08/03/24 1059 08/04/24 1100   08/03/24 2000  metroNIDAZOLE (FLAGYL) IVPB 500 mg        500 mg 100 mL/hr over 60 Minutes Intravenous Every 12 hours 08/03/24 1059 08/04/24 1240   08/03/24 0700  ciprofloxacin (CIPRO) IVPB 400 mg        400 mg 200 mL/hr over 60 Minutes Intravenous   Once 08/02/24 1713 08/03/24 0734   08/03/24 0700  metroNIDAZOLE (FLAGYL) IVPB 500 mg        500 mg 100 mL/hr over 60 Minutes Intravenous  Once 08/02/24 1713 08/03/24 0734   07/30/24 2000  levofloxacin (LEVAQUIN) tablet 250 mg  Status:  Discontinued        250 mg Oral  Once 07/30/24 1958 07/30/24 1959   07/30/24 2000  levofloxacin (LEVAQUIN) tablet 750 mg        750 mg Oral  Once 07/30/24 1959 07/30/24 2021              Family Communication/Anticipated D/C date and plan/Code Status   DVT prophylaxis:      Code Status: Full Code  Family Communication: Plan discussed with Tracey, daughter, at the bedside Disposition Plan: Plan to discharge home versus SNF   Status is: Inpatient Remains inpatient appropriate because: Ex lap for small bowel obstruction       Subjective:   Interval events noted.  Abdominal pain has improved.  She moved her bowels this morning.  Dwayne, daughter, at the bedside  Objective:    Vitals:   08/07/24 0311 08/07/24 0500 08/07/24 0545 08/07/24 0719  BP: 119/76   119/63  Pulse: 93   (!) 105  Resp: 17  18 16   Temp: 98 F (36.7 C)   97.6 F (36.4 C)  TempSrc:      SpO2: 97%   95%  Weight:  72.3 kg    Height:       No data found.   Intake/Output Summary (Last 24 hours) at 08/07/2024 1122 Last data filed at 08/07/2024 0547 Gross per 24 hour  Intake 2065.9 ml  Output 300 ml  Net 1765.9 ml   Filed Weights   08/05/24 0500 08/06/24 0449 08/07/24 0500  Weight: 74.9 kg 74.2 kg 72.3 kg    Exam:   GEN: NAD SKIN: Warm and dry EYES: No pallor or icterus ENT: MMM CV: Irregular rate and rhythm, mildly tachycardic PULM: CTA B ABD: soft, ND, NT, +BS, surgical incisions are clean, dry and intact CNS: AAO x 3, non focal EXT: No edema or tenderness       Data Reviewed:   I have personally reviewed following labs and imaging studies:  Labs: Labs show the following:   Basic Metabolic Panel: Recent Labs  Lab 08/03/24 0330  08/04/24 0504 08/05/24 0351 08/06/24 0134 08/06/24 0137 08/07/24 0519  NA 135 132* 132* 134*  --  139  K 3.6 3.8 4.0 4.1  --  4.1  CL 102 103 102 104  --  107  CO2 23 22 19* 21*  --  23  GLUCOSE 113* 112* 119* 105*  --  117*  BUN 33* 32* 31* 27*  --  30*  CREATININE 0.82 0.83 0.78 0.62  --  0.64  CALCIUM  8.4* 8.7* 8.6* 8.6*  --  8.7*  MG 2.1 1.8 2.2  --  2.3 2.2  PHOS 2.8 3.0 2.2* 2.9  --  2.6   GFR Estimated Creatinine Clearance: 47.3 mL/min (by C-G formula based on SCr of 0.64 mg/dL). Liver Function Tests: Recent Labs  Lab 08/06/24 0134 08/07/24 0519  AST  --  28  ALT  --  28  ALKPHOS  --  55  BILITOT  --  0.5  PROT  --  4.8*  ALBUMIN 2.5* 2.1*   No results for input(s): LIPASE, AMYLASE in the last 168 hours.  No results for input(s): AMMONIA in the last 168 hours. Coagulation profile Recent Labs  Lab 08/04/24 1328  INR 1.4*    CBC: Recent Labs  Lab 08/01/24 0401 08/03/24 0330 08/05/24 0351 08/06/24 0137 08/07/24 0519  WBC 8.0 8.7 14.7* 12.7* 9.0  HGB 12.2 13.0 11.9* 11.1* 9.5*  HCT 36.4 38.9 35.2* 32.2* 28.2*  MCV 91.0 90.5 90.0 89.2 91.3  PLT 142* 167 137* 134* 153   Cardiac Enzymes: No results for input(s): CKTOTAL, CKMB, CKMBINDEX, TROPONINI in the last 168 hours. BNP (last 3 results) No results for input(s): PROBNP in the last 8760 hours. CBG: Recent Labs  Lab 08/06/24 1207 08/06/24 1645 08/07/24 0018 08/07/24 0416 08/07/24 0814  GLUCAP 95 101* 129* 114* 125*   D-Dimer: No results for input(s): DDIMER in the last 72 hours. Hgb A1c: No results for input(s): HGBA1C in the last 72 hours.  Lipid Profile: Recent Labs    08/07/24 0519  TRIG 143   Thyroid  function studies: No results for input(s): TSH, T4TOTAL, T3FREE, THYROIDAB in the last 72 hours.  Invalid input(s): FREET3 Anemia work up: No results for input(s): VITAMINB12, FOLATE, FERRITIN, TIBC, IRON, RETICCTPCT in the last 72  hours. Sepsis Labs: Recent Labs  Lab 08/03/24 0330 08/05/24 0351 08/06/24 0137 08/07/24 0519  WBC 8.7 14.7* 12.7* 9.0    Microbiology Recent Results (from the past 240 hours)  Resp panel by RT-PCR (RSV, Flu A&B, Covid) Anterior Nasal Swab     Status: None   Collection Time: 07/30/24  7:08 PM   Specimen: Anterior Nasal Swab  Result Value Ref Range Status   SARS Coronavirus 2 by RT PCR NEGATIVE NEGATIVE Final    Comment: (NOTE) SARS-CoV-2 target nucleic acids are NOT DETECTED.  The SARS-CoV-2 RNA is generally detectable in upper respiratory specimens during the acute phase of infection. The lowest concentration of SARS-CoV-2 viral copies this assay can detect is 138 copies/mL. A negative result does not preclude SARS-Cov-2 infection and should not be used as the sole basis for treatment or other patient management decisions. A negative result may occur with  improper specimen collection/handling, submission of specimen other than nasopharyngeal swab, presence of viral mutation(s) within the areas targeted by this assay, and inadequate number of viral copies(<138 copies/mL). A negative result must be combined with clinical observations, patient history, and epidemiological information. The expected result is Negative.  Fact Sheet for Patients:  BloggerCourse.com  Fact Sheet for Healthcare Providers:  SeriousBroker.it  This test is no t yet approved or cleared by the [place]  FDA and  has been authorized for detection and/or diagnosis of SARS-CoV-2 by FDA under an Emergency Use Authorization (EUA). This EUA will remain  in effect (meaning this test can be used) for the duration of the COVID-19 declaration under Section 564(b)(1) of the Act, 21  U.S.C.section 360bbb-3(b)(1), unless the authorization is terminated  or revoked sooner.       Influenza A by PCR NEGATIVE NEGATIVE Final   Influenza B by PCR NEGATIVE NEGATIVE  Final    Comment: (NOTE) The Xpert Xpress SARS-CoV-2/FLU/RSV plus assay is intended as an aid in the diagnosis of influenza from Nasopharyngeal swab specimens and should not be used as a sole basis for treatment. Nasal washings and aspirates are unacceptable for Xpert Xpress SARS-CoV-2/FLU/RSV testing.  Fact Sheet for Patients: BloggerCourse.com  Fact Sheet for Healthcare Providers: SeriousBroker.it  This test is not yet approved or cleared by the [place]  FDA and has been authorized for detection and/or diagnosis of SARS-CoV-2 by FDA under an Emergency Use Authorization (EUA). This EUA will remain in effect (meaning this test can be used) for the duration of the COVID-19 declaration under Section 564(b)(1) of the Act, 21 U.S.C. section 360bbb-3(b)(1), unless the authorization is terminated or revoked.     Resp Syncytial Virus by PCR NEGATIVE NEGATIVE Final    Comment: (NOTE) Fact Sheet for Patients: BloggerCourse.com  Fact Sheet for Healthcare Providers: SeriousBroker.it  This test is not yet approved or cleared by the [place]  FDA and has been authorized for detection and/or diagnosis of SARS-CoV-2 by FDA under an Emergency Use Authorization (EUA). This EUA will remain in effect (meaning this test can be used) for the duration of the COVID-19 declaration under Section 564(b)(1) of the Act, 21 U.S.C. section 360bbb-3(b)(1), unless the authorization is terminated or revoked.  Performed at [facility], [address] [place], [place] [address]     Procedures and diagnostic studies:  No results found.              LOS: 7 days   Billyjoe Go  [facility]   Pager on [email or web address]. If 7PM-7AM, please contact night-coverage at [email or web address]     08/07/2024, 11:22 AM

## 2024-08-07 NOTE — Progress Notes (Signed)
 Occupational Therapy Treatment Patient Details Name: Maria Trujillo MRN: [id number] DOB: April 12, 1934 Today's Date: 08/07/2024   History of present illness 89 y.o. female with medical history significant for CAD with prior NSTEMI, HTN, HLD, persistent A-fib on Eliquis  and amiodarone , complicated by post conversion  pauses/bradycardia with syncope 04/2024 s/p pacemaker on 07/24/2024 -(Eliquis  held 9/26 to 9/30), being admitted for small bowel obstruction.   OT comments  Pt is supine in bed on arrival. Easily arousable and agreeable to OT session. She does not report any pain. Pt performed bed mobility with Min A, cues for LUE/pacemaker precautions. Pt agreeable to routine ADL session this date seated at EOB d/t fatigue. Mod A for UB and LB ADL management this date both seated and standing at EOB. Pt able to stand from EOB with HHA x1 and Min A for lift off from lowest bed height with ability to lateral step to [facility] with CGA. Pt returned to bed with all needs in place and will cont to require skilled acute OT services to maximize her safety and IND to return to PLOF.       If plan is discharge home, recommend the following:  A lot of help with walking and/or transfers;A lot of help with bathing/dressing/bathroom;Assistance with cooking/housework;Assist for transportation;Help with stairs or ramp for entrance   Equipment Recommendations  Other (comment) (defer to next venue)    Recommendations for Other Services      Precautions / Restrictions Precautions Precautions: Fall;ICD/Pacemaker Recall of Precautions/Restrictions: Intact Restrictions Weight Bearing Restrictions Per Provider Order: No Other Position/Activity Restrictions: L UE pacemaker precautions -limit pushing and pulling       Mobility Bed Mobility Overal bed mobility: Needs Assistance Bed Mobility: Supine to Sit, Sit to Supine     Supine to sit: Min assist, Used rails, HOB elevated Sit to supine: Min assist   General bed  mobility comments: trunkal elevation to sit EOB, Min A for BLe management to return to supine    Transfers Overall transfer level: Needs assistance Equipment used: 1 person hand held assist Transfers: Sit to/from Stand Sit to Stand: Min assist, Contact guard assist           General transfer comment: Min A to stand from lowest bed height using HHA x1--no pushing up with LUE during transfer     Balance Overall balance assessment: Needs assistance Sitting-balance support: Feet supported Sitting balance-Leahy Scale: Good Sitting balance - Comments: supervision seated EOB   Standing balance support: Single extremity supported Standing balance-Leahy Scale: Fair Standing balance comment: able to perform standing peri-care with CGA                           ADL either performed or assessed with clinical judgement   ADL Overall ADL's : Needs assistance/impaired     Grooming: Wash/dry face;Sitting;Set up;Applying deodorant Grooming Details (indicate cue type and reason): seated EOB Upper Body Bathing: Moderate assistance;Sitting;Cueing for UE precautions Upper Body Bathing Details (indicate cue type and reason): d/t fatigue and cues for pacemaker precautions Lower Body Bathing: Moderate assistance;Sit to/from stand Lower Body Bathing Details (indicate cue type and reason): able to bathe anterior peri-area, needed assist for posterior and legs Upper Body Dressing : Minimal assistance;Sitting Upper Body Dressing Details (indicate cue type and reason): doff/donn gown                        Extremity/Trunk Assessment  Vision       Perception     Praxis     Communication Communication Communication: No apparent difficulties   Cognition Arousal: Alert Behavior During Therapy: WFL for tasks assessed/performed                                 Following commands: Intact        Cueing   Cueing Techniques: Verbal cues   Exercises      Shoulder Instructions       General Comments abdominal incision, PICC and pacemaker incision all intact pre/post session w/o issue    Pertinent Vitals/ Pain       Pain Assessment Pain Assessment: No/denies pain Pain Intervention(s): Monitored during session, Repositioned, Limited activity within patient's tolerance  Home Living                                          Prior Functioning/Environment              Frequency  Min 2X/week        Progress Toward Goals  OT Goals(current goals can now be found in the care plan section)  Progress towards OT goals: Progressing toward goals  Acute Rehab OT Goals Patient Stated Goal: improve strength OT Goal Formulation: With patient/family Time For Goal Achievement: 08/18/24 Potential to Achieve Goals: Fair  Plan      Co-evaluation                 AM-PAC OT 6 Clicks Daily Activity     Outcome Measure   Help from another person eating meals?: None Help from another person taking care of personal grooming?: A Little Help from another person toileting, which includes using toliet, bedpan, or urinal?: A Lot Help from another person bathing (including washing, rinsing, drying)?: A Lot Help from another person to put on and taking off regular upper body clothing?: A Little Help from another person to put on and taking off regular lower body clothing?: A Lot 6 Click Score: 16    End of Session    OT Visit Diagnosis: Unsteadiness on feet (R26.81);Repeated falls (R29.6);Muscle weakness (generalized) (M62.81)   Activity Tolerance Patient tolerated treatment well   Patient Left in bed;with call bell/phone within reach;with family/visitor present   Nurse Communication Mobility status        Time: 8449-8378 OT Time Calculation (min): 31 min  Charges: OT General Charges $OT Visit: 1 Visit OT Treatments $Self Care/Home Management : 23-37 mins  Amila Callies,  OTR/L  08/07/24, 4:54 PM   Emalina Dubreuil E Ellerie Arenz 08/07/2024, 4:52 PM

## 2024-08-07 NOTE — TOC Progression Note (Signed)
 Transition of Care [facility]) - Progression Note    Patient Details  Name: Maria Trujillo MRN: [id number] Date of Birth: [birth year]  [facility] of [facility]) CM/SW Contact  [name] JAYSON Carpen, LCSW Phone Number: 08/07/2024, 9:49 AM  Clinical Narrative:  SNF's typically will not accept TPN. They do not accept NG tubes. Will follow progress and update SNF referral to send back out once there are plans to remove NG and TPN.   Expected Discharge Plan and Services                                               Social Drivers of Health (SDOH) Interventions SDOH Screenings   Food Insecurity: No Food Insecurity (07/30/2024)  Housing: Low Risk  (07/30/2024)  Transportation Needs: No Transportation Needs (07/30/2024)  Utilities: Not At Risk (07/30/2024)  Depression (PHQ2-9): Low Risk  (04/29/2021)  Financial Resource Strain: Low Risk  (01/20/2024)   Received from [facility]  Physical Activity: Inactive (11/03/2018)   Received from [facility]  Social Connections: Moderately Isolated (07/30/2024)  Stress: No Stress Concern Present (11/03/2018)   Received from [facility]  Tobacco Use: Low Risk  (08/03/2024)  Health Literacy: Low Risk  (02/03/2021)   Received from [facility]    Readmission Risk Interventions     No data to display

## 2024-08-07 NOTE — Progress Notes (Signed)
 PHARMACY - TOTAL PARENTERAL NUTRITION CONSULT NOTE   Indication: Small bowel obstruction  Patient Measurements: Height: 5' 4 (162.6 cm) Weight: 72.3 kg (159 lb 6.3 oz) IBW/kg (Calculated) : 54.7 TPN AdjBW (KG): 59.6 Body mass index is 27.36 kg/m. Usual Weight: UNK  Assessment: 89yo female admitted with intractable vomiting and small bowel obstruction. Patient has PMH cholecystectomy, CAD, NSTEMI, HTN, HLD, A. Fib, and recent pacemaker. She has been NPO/clear liquids since admission. Patient reports Hives allergy to Shellfish. Allergy was confirmed with family to be to ALL seafood.   Glucose / Insulin: BG 90-140s > 100-125 (0 units of SSI used in the last 24 hours) Electrolytes: WNL Renal: Scr <1 Hepatic: LFTs WNL Intake / Output; MIVF: D5/1/2NS w/KCL @ 100 ml/hr - Stop time 10/9 @ 1714 GI Imaging:  10/8 CT/ABDOMEN PELVIS Complex small bowel obstruction with 2 points of obstruction  GI Surgeries / Procedures: S/P exploratory laparotomy, small bowel resection, reduction of internal hernia, and repair or ventral hernia on 10/9  Central access: PICC order Placed 10/10 AM TPN start date: 08/04/2024  Nutritional Goals: Goal TPN rate is 70 mL/hr (provides 84g of protein and 1696 kcals per day)  RD Assessment: Estimated Needs Total Energy Estimated Needs: 1600-1800kcal/day Total Protein Estimated Needs: 80-90g/day Total Fluid Estimated Needs: 1.4-1.6L/day  Current Nutrition:  NPO,   Plan:  Continue TPN to goal rate of 70 mL/hr at 1800.  Electrolytes in TPN: Na 57mEq/L, K 83mEq/L, Ca 70mEq/L, Mg 79mEq/L, and Phos 15mmol/L. Cl:Ac 1:1 Due to allergy to ALL Seafoods- INTRALIPIDS used instead of SMOFLIPIDS Add standard MVI and trace elements to TPN Thiamine 100mg  IV x 7 days ordered outside of bag.  Continue Sensitive q6h SSI and adjust as needed  Monitor TPN labs on Mon/Thurs. BMP, Phos, and Mag monitored daily until stable.   Josphine Laffey A Cynthea Zachman, PharmD Clinical  Pharmacist 08/07/2024 8:10 AM

## 2024-08-07 NOTE — Progress Notes (Signed)
 Physical Therapy Treatment Patient Details Name: Maria Trujillo MRN: [id number] DOB: December 18, 1933 Today's Date: 08/07/2024   History of Present Illness 89 y.o. female with medical history significant for CAD with prior NSTEMI, HTN, HLD, persistent A-fib on Eliquis  and amiodarone , complicated by post conversion  pauses/bradycardia with syncope 04/2024 s/p pacemaker on 07/24/2024 -(Eliquis  held 9/26 to 9/30), being admitted for small bowel obstruction.    PT Comments  Patient agreeable to PT session. Supportive daughter at the bedside. Increased activity tolerance this session. Patient walked around 52ft with her own cane with CGA provided for safety. She is fatigued with activity. Patient and family goal is still for short term rehab after this hospital stay. PT will continue to follow.    If plan is discharge home, recommend the following: Two people to help with walking and/or transfers;A lot of help with bathing/dressing/bathroom;Help with stairs or ramp for entrance   Can travel by private vehicle     No  Equipment Recommendations  Other (comment)    Recommendations for Other Services       Precautions / Restrictions Precautions Precautions: Fall;ICD/Pacemaker Recall of Precautions/Restrictions: Intact Restrictions Weight Bearing Restrictions Per Provider Order: No Other Position/Activity Restrictions: L UE pacemaker precautions -limit pushing and pulling     Mobility  Bed Mobility Overal bed mobility: Needs Assistance Bed Mobility: Supine to Sit, Sit to Supine     Supine to sit: Min assist, Used rails, HOB elevated Sit to supine: Min assist   General bed mobility comments: assistance for LE support. cues for technique.    Transfers Overall transfer level: Needs assistance Equipment used: 1 person hand held assist Transfers: Sit to/from Stand Sit to Stand: Min assist, Contact guard assist           General transfer comment: Min A for standing from bed and CGA for  standing from [facility]. patient avoids pushing with LUE with transfers.    Ambulation/Gait Ambulation/Gait assistance: Contact guard assist Gait Distance (Feet): 30 Feet Assistive device: Straight cane (used in right hand) Gait Pattern/deviations: Step-through pattern, Decreased stride length, Trunk flexed, Narrow base of support Gait velocity: decreased     General Gait Details: slow without loss of balance while walking in the room with her own cane. education on monitoring for need for rest breaks and short distance ambulation bouts for conditioning. she may have improved gait pattern and wider base of support with rolling walker however she feels better using the cane due to recent left side pacemaker   Stairs             Wheelchair Mobility     Tilt Bed    Modified Rankin (Stroke Patients Only)       Balance Overall balance assessment: Needs assistance Sitting-balance support: Feet supported Sitting balance-Leahy Scale: Good     Standing balance support: Single extremity supported Standing balance-Leahy Scale: Poor Standing balance comment: relying on cane for support in standing                            Communication Communication Communication: No apparent difficulties  Cognition Arousal: Alert Behavior During Therapy: WFL for tasks assessed/performed   PT - Cognitive impairments: No apparent impairments                         Following commands: Intact      Cueing Cueing Techniques: Verbal cues  Exercises  General Comments General comments (skin integrity, edema, etc.): no pain reported with mobility. patient is fatigued after walking and requesting to return to bed for a nap. supportive daughter at the bedside. small amount of urine in the bedside commode when up      Pertinent Vitals/Pain Pain Assessment Pain Assessment: No/denies pain    Home Living                          Prior Function             PT Goals (current goals can now be found in the care plan section) Acute Rehab PT Goals Patient Stated Goal: to get stronger PT Goal Formulation: With patient Time For Goal Achievement: 08/19/24 Potential to Achieve Goals: Fair Progress towards PT goals: Progressing toward goals    Frequency    Min 2X/week      PT Plan      Co-evaluation              AM-PAC PT 6 Clicks Mobility   Outcome Measure  Help needed turning from your back to your side while in a flat bed without using bedrails?: A Little Help needed moving from lying on your back to sitting on the side of a flat bed without using bedrails?: A Little Help needed moving to and from a bed to a chair (including a wheelchair)?: A Little Help needed standing up from a chair using your arms (e.g., wheelchair or bedside chair)?: A Little Help needed to walk in hospital room?: A Little Help needed climbing 3-5 steps with a railing? : Total 6 Click Score: 16    End of Session   Activity Tolerance: Patient tolerated treatment well Patient left: in bed;with call bell/phone within reach;with family/visitor present;with SCD's reapplied Nurse Communication: Mobility status PT Visit Diagnosis: Unsteadiness on feet (R26.81);Muscle weakness (generalized) (M62.81);Difficulty in walking, not elsewhere classified (R26.2)     Time: 8697-8673 PT Time Calculation (min) (ACUTE ONLY): 24 min  Charges:    $Therapeutic Activity: 23-37 mins PT General Charges $$ ACUTE PT VISIT: 1 Visit                     [name] Essex, PT, MPT    [name] LULLA Essex 08/07/2024, 1:52 PM

## 2024-08-07 NOTE — Progress Notes (Signed)
 [facility] CLINIC CARDIOLOGY PROGRESS NOTE       Patient ID: Maria Trujillo MRN: [id number] DOB/AGE: 02/09/1934 89 y.o.  Admit date: 07/30/2024 Referring Physician Dr. Amaryllis Dare Primary Physician Feldpausch, Cheryl BRAVO, MD  Primary Cardiologist Dr. Wilburn Reason for Consultation AF RVR  HPI: Maria Trujillo is a 89 y.o. female  with a past medical history of  persistent atrial fibrillation (DCCV 02/2023, on Tikosyn ), hypertension, hyperlipidemia, history bradycardia, history NSTEMI (10/2020), coronary artery disease s/p stent to RCA (10/2020) who presented to the ED on 07/30/2024 for intractable vomiting and abdominal pain. Admitted for small bowel obstruction/ileus. 08/04/2024 HR elevated, AF RVR. Cardiology was consulted for further evaluation.   Interval history:  -Patient seen and examined this AM, daughter at bedside.  -Reports feeling well overall today. Denies CP or palpitations.  -HR around 100s on tele, remains on amio gtt.  -Had a BM this AM.  Review of systems complete and found to be negative unless listed above    Past Medical History:  Diagnosis Date   Arthritis    Atrial fibrillation (HCC)    History of kidney stones    Hypothyroidism    Myocardial infarct (HCC)    PONV (postoperative nausea and vomiting)     Past Surgical History:  Procedure Laterality Date   BOWEL RESECTION N/A 08/03/2024   Procedure: EXCISION, SMALL INTESTINE;  Surgeon: Jordis Laneta FALCON, MD;  Location: [facility] ORS;  Service: General;  Laterality: N/A;   BREAST EXCISIONAL BIOPSY Bilateral 1984   benign   CARDIOVERSION N/A 03/04/2023   Procedure: CARDIOVERSION;  Surgeon: Ammon Blunt, MD;  Location: [facility] ORS;  Service: Cardiovascular;  Laterality: N/A;   CATARACT EXTRACTION, BILATERAL     CHOLECYSTECTOMY     EYE SURGERY     JOINT REPLACEMENT Left 12/2012   knee   KNEE ARTHROPLASTY Right 02/15/2017   Procedure: COMPUTER ASSISTED TOTAL KNEE ARTHROPLASTY;  Surgeon: Lynwood SHAUNNA Hue, MD;  Location:  [facility] ORS;  Service: Orthopedics;  Laterality: Right;   LAPAROTOMY N/A 08/03/2024   Procedure: LAPAROTOMY, EXPLORATORY;  Surgeon: Jordis Laneta FALCON, MD;  Location: [facility] ORS;  Service: General;  Laterality: N/A;   LEFT HEART CATH AND CORONARY ANGIOGRAPHY N/A 11/04/2020   Procedure: LEFT HEART CATH AND CORONARY ANGIOGRAPHY;  Surgeon: Ammon Blunt, MD;  Location: [facility] INVASIVE CV LAB;  Service: Cardiovascular;  Laterality: N/A;   PACEMAKER IMPLANT N/A 07/24/2024   Procedure: PACEMAKER IMPLANT;  Surgeon: Cindie Ole DASEN, MD;  Location: [facility] INVASIVE CV LAB;  Service: Cardiovascular;  Laterality: N/A;   PHOTOCOAGULATION WITH LASER Right 04/20/2018   Procedure: TRANSCLERAL DIODE CYCLOPHOTOCOAGULATION  RIGHT per Hope block;  Surgeon: Mittie Gaskin, MD;  Location: [facility];  Service: Ophthalmology;  Laterality: Right;   ROTATOR CUFF REPAIR Left 2010   TONSILLECTOMY     VENTRAL HERNIA REPAIR N/A 08/03/2024   Procedure: REPAIR, HERNIA, VENTRAL;  Surgeon: Jordis Laneta FALCON, MD;  Location: [facility] ORS;  Service: General;  Laterality: N/A;    Medications Prior to Admission  Medication Sig Dispense Refill Last Dose/Taking   acetaminophen  (TYLENOL ) 650 MG CR tablet Take 650 mg by mouth daily as needed for pain.   Taking As Needed   amiodarone  (PACERONE ) 200 MG tablet Take 2 tablets (400 mg total) by mouth 2 (two) times daily for 10 days, THEN 1 tablet (200 mg total) daily. (Patient taking differently:  (200 mg total) daily.) 70 tablet 0 07/30/2024 Morning   apixaban  (ELIQUIS ) 5 MG TABS tablet Take 1 tablet (5  mg total) by mouth 2 (two) times daily. 180 tablet 3 07/30/2024 Morning   atorvastatin  (LIPITOR ) 40 MG tablet Take 40 mg by mouth at bedtime.   07/29/2024 Bedtime   azelastine  (ASTELIN ) 0.1 % nasal spray Place 1 spray into both nostrils daily.   07/30/2024 Morning   bimatoprost (LUMIGAN) 0.03 % ophthalmic solution Place 1 drop into the right eye at bedtime.   07/29/2024 Bedtime   Calcium   Carb-Cholecalciferol  (CALCIUM  CARBONATE-VITAMIN D3) 600-400 MG-UNIT TABS Take 1 tablet by mouth daily.   07/30/2024 Morning   Carboxymethylcellulose Sodium (THERATEARS) 0.25 % SOLN Place 1 drop into both eyes daily as needed (Dry eyes).   Taking As Needed   Cholecalciferol  (VITAMIN D ) 50 MCG (2000 UT) tablet Take 2,000 Units by mouth daily.   07/30/2024 Morning   Docusate Sodium  100 MG capsule Take 100 mg by mouth daily as needed for mild constipation or moderate constipation.   Taking As Needed   fexofenadine (ALLEGRA) 180 MG tablet Take 180 mg by mouth at bedtime.   07/29/2024 Bedtime   folic acid (FOLVITE) 400 MCG tablet Take 400 mcg by mouth daily.   Taking   ipratropium (ATROVENT) 0.06 % nasal spray Place 1 spray into both nostrils daily as needed for rhinitis.   Taking As Needed   isosorbide  mononitrate (IMDUR ) 30 MG 24 hr tablet Take 1 tablet (30 mg total) by mouth daily. 30 tablet 2 07/30/2024 Morning   levothyroxine  (SYNTHROID , LEVOTHROID) 100 MCG tablet Take 100 mcg by mouth daily before breakfast.   07/30/2024 Morning   lisinopril  (ZESTRIL ) 5 MG tablet Take 1 tablet (5 mg total) by mouth daily. 30 tablet 2 07/30/2024 Morning   LUMIGAN 0.01 % SOLN Place 1 drop into the right eye at bedtime.   Taking   metoprolol  tartrate (LOPRESSOR ) 25 MG tablet Take 0.5 tablets (12.5 mg total) by mouth daily as needed. for heart rate over 100.  Home med. 10 tablet 1 Unknown   Multiple Vitamin (MULTIVITAMIN WITH MINERALS) TABS tablet Take 1 tablet by mouth daily.   07/30/2024 Morning   Multiple Vitamins-Minerals (PRESERVISION AREDS 2 PO) Take 2 tablets by mouth 2 (two) times daily.   Taking   pantoprazole  (PROTONIX ) 40 MG tablet Take 40 mg by mouth daily.   07/30/2024 Morning   prednisoLONE acetate (PRED FORTE) 1 % ophthalmic suspension Place 1 drop into the right eye 4 (four) times daily.   Taking   timolol  (TIMOPTIC ) 0.5 % ophthalmic solution Place 1 drop into the right eye in the morning.   07/30/2024 Morning    vitamin B-12 (CYANOCOBALAMIN ) 100 MCG tablet Take 100 mcg by mouth daily.   07/30/2024 Morning   furosemide  (LASIX ) 20 MG tablet Take 20 mg by mouth daily. prn (Patient not taking: Reported on 07/21/2024)      Social History   Socioeconomic History   Marital status: Widowed    Spouse name: Not on file   Number of children: Not on file   Years of education: Not on file   Highest education level: Not on file  Occupational History   Not on file  Tobacco Use   Smoking status: Never   Smokeless tobacco: Never  Vaping Use   Vaping status: Never Used  Substance and Sexual Activity   Alcohol  use: No   Drug use: No   Sexual activity: Not on file  Other Topics Concern   Not on file  Social History Narrative   Not on file   Social Drivers of Health  Financial Resource Strain: Low Risk  (01/20/2024)   Received from [facility]   Overall Financial Resource Strain (CARDIA)    Difficulty of Paying Living Expenses: Not hard at all  Food Insecurity: No Food Insecurity (07/30/2024)   Hunger Vital Sign    Worried About Running Out of Food in the Last Year: Never true    Ran Out of Food in the Last Year: Never true  Transportation Needs: No Transportation Needs (07/30/2024)   PRAPARE - Administrator, Civil Service (Medical): No    Lack of Transportation (Non-Medical): No  Physical Activity: Inactive (11/03/2018)   Received from [facility]   Exercise Vital Sign    Days of Exercise per Week: 0 days    Minutes of Exercise per Session: 0 min  Stress: No Stress Concern Present (11/03/2018)   Received from [facility] - Occupational Stress Questionnaire    Feeling of Stress : Not at all  Social Connections: Moderately Isolated (07/30/2024)   Social Connection and Isolation Panel    Frequency of Communication with Friends and Family: Three times a week    Frequency of Social Gatherings with Friends and Family: Once a  week    Attends Religious Services: More than 4 times per year    Active Member of [employer] or Organizations: No    Attends Banker Meetings: Never    Marital Status: Widowed  Intimate Partner Violence: Not At Risk (07/30/2024)   Humiliation, Afraid, Rape, and Kick questionnaire    Fear of Current or Ex-Partner: No    Emotionally Abused: No    Physically Abused: No    Sexually Abused: No    Family History  Problem Relation Age of Onset   Breast cancer Sister 19     Vitals:   08/07/24 0311 08/07/24 0500 08/07/24 0545 08/07/24 0719  BP: 119/76   119/63  Pulse: 93   (!) 105  Resp: 17  18 16   Temp: 98 F (36.7 C)   97.6 F (36.4 C)  TempSrc:      SpO2: 97%   95%  Weight:  72.3 kg    Height:        PHYSICAL EXAM General: Ill-appearing elderly female, well nourished, in no acute distress. HEENT: Normocephalic and atraumatic. NGT in place. Neck: No JVD.  Lungs: Normal respiratory effort on room air. Clear bilaterally to auscultation. No wheezes, crackles, rhonchi.  Heart: Irregularly irregular, elevated rate. Normal S1 and S2 without gallops or murmurs.  Abdomen: Non-distended appearing.  Msk: Normal strength and tone for age. Extremities: Warm and well perfused. No clubbing, cyanosis.  No edema.  Neuro: Alert and oriented X 3. Psych: Answers questions appropriately.   Labs: Basic Metabolic Panel: Recent Labs    08/06/24 0134 08/06/24 0137 08/07/24 0519  NA 134*  --  139  K 4.1  --  4.1  CL 104  --  107  CO2 21*  --  23  GLUCOSE 105*  --  117*  BUN 27*  --  30*  CREATININE 0.62  --  0.64  CALCIUM  8.6*  --  8.7*  MG  --  2.3 2.2  PHOS 2.9  --  2.6   Liver Function Tests: Recent Labs    08/06/24 0134 08/07/24 0519  AST  --  28  ALT  --  28  ALKPHOS  --  55  BILITOT  --  0.5  PROT  --  4.8*  ALBUMIN 2.5* 2.1*   No results for input(s): LIPASE, AMYLASE in the last 72 hours. CBC: Recent Labs    08/06/24 0137 08/07/24 0519  WBC 12.7* 9.0   HGB 11.1* 9.5*  HCT 32.2* 28.2*  MCV 89.2 91.3  PLT 134* 153   Cardiac Enzymes: No results for input(s): CKTOTAL, CKMB, CKMBINDEX, TROPONINIHS in the last 72 hours. BNP: No results for input(s): BNP in the last 72 hours. D-Dimer: No results for input(s): DDIMER in the last 72 hours. Hemoglobin A1C: No results for input(s): HGBA1C in the last 72 hours.  Fasting Lipid Panel: Recent Labs    08/07/24 0519  TRIG 143   Thyroid  Function Tests: No results for input(s): TSH, T4TOTAL, T3FREE, THYROIDAB in the last 72 hours.  Invalid input(s): FREET3 Anemia Panel: No results for input(s): VITAMINB12, FOLATE, FERRITIN, TIBC, IRON, RETICCTPCT in the last 72 hours.   Radiology: IR PICC PLACEMENT RIGHT >5 YRS INC IMG GUIDE Result Date: 08/04/2024 INDICATION: 89 year old female with recently placed left subclavian approach cardiac rhythm maintenance device. Unfortunately, she requires TPN and therefore IR is consulted for PICC placement. EXAM: PICC LINE PLACEMENT WITH ULTRASOUND AND FLUOROSCOPIC GUIDANCE MEDICATIONS: None. ANESTHESIA/SEDATION: None. FLUOROSCOPY TIME:  Radiation exposure index: 2.9 mGy, air kerma COMPLICATIONS: SIR Level A - No therapy, no consequence. Inadvertent arterial puncture by the APP initially performing the procedure. The complication was recognized immediately and pressure held for 25 minutes. No evidence of hematoma. Physician then took over and completed placement of PICC. PROCEDURE: The patient was advised of the possible risks and complications and agreed to undergo the procedure. The patient was then brought to the angiographic suite for the procedure. The right arm was prepped with chlorhexidine , draped in the usual sterile fashion using maximum barrier technique (cap and mask, sterile gown, sterile gloves, large sterile sheet, hand hygiene and cutaneous antisepsis) and infiltrated locally with 1% Lidocaine . Ultrasound demonstrated  patency of the right brachial vein, and this was documented with an image. Under real-time ultrasound guidance, this vein was accessed with a 21 gauge micropuncture needle and image documentation was performed. A 0.018 wire was introduced in to the vein. Over this, a 5 [place] dual lumen power-injectable PICC was advanced to the lower SVC. Fluoroscopy during the procedure and fluoro spot radiograph confirms appropriate catheter position. The catheter was flushed and covered with a sterile dressing. Catheter length: 32 cm IMPRESSION: Successful right arm Power PICC line placement with ultrasound and fluoroscopic guidance. The catheter is ready for use. Electronically Signed   By: Wilkie Lent M.D.   On: 08/04/2024 16:44   US  EKG SITE RITE Result Date: 08/03/2024 If Site Rite image not attached, placement could not be confirmed due to current cardiac rhythm.  DG Abd 1 View Result Date: 08/02/2024 CLINICAL DATA:  Nasogastric tube placement EXAM: ABDOMEN - 1 VIEW COMPARISON:  None Available. FINDINGS: No nasogastric tube is identified within visualized lung bases and upper abdomen. Dilated fluid-filled loops of bowel noted on prior CT examination is not well appreciated on this exam. No free intraperitoneal gas. Pacemaker in place. IMPRESSION: 1. No nasogastric tube identified. Correlate for malposition within the oropharynx is recommended. Electronically Signed   By: Dorethia Molt M.D.   On: 08/02/2024 23:33   CT ABDOMEN PELVIS W CONTRAST Result Date: 08/02/2024 EXAM: CT ABDOMEN AND PELVIS WITH CONTRAST 08/02/2024 07:14:10 PM TECHNIQUE: CT of the abdomen and pelvis was performed with the administration of intravenous contrast. Multiplanar reformatted images are provided for review. Automated  exposure control, iterative reconstruction, and/or weight-based adjustment of the mA/kV was utilized to reduce the radiation dose to as low as reasonably achievable. COMPARISON: None available. CLINICAL HISTORY:  Abdominal pain, acute, nonlocalized. 89 y.o. female with medical history significant for CAD with prior NSTEMI, HTN, HLD, persistent A-fib on Eliquis  and amiodarone , complicated by post conversion pauses/bradycardia with syncope 04/2024 s/p pacemaker on 07/24/2024 -(Eliquis  held 9/26 to 9/30), being admitted for small bowel obstruction. She presented with intractable nonbloody, nonbilious, non-coffee-ground vomiting starting on the night prior to arrival associated with lower abdominal pain. FINDINGS: LOWER CHEST: Pacemaker leads within the right heart. Cardiac size within normal limits. Moderate coronary artery calcification. LIVER: The liver is unremarkable. GALLBLADDER AND BILE DUCTS: Status post cholecystectomy. SPLEEN: No acute abnormality. PANCREAS: No acute abnormality. ADRENAL GLANDS: No acute abnormality. KIDNEYS, URETERS AND BLADDER: No stones in the kidneys or ureters. No hydronephrosis. No perinephric or periureteral stranding. Urinary bladder is unremarkable. GI AND BOWEL: There was a complex small bowel obstruction with 2 points of transitioning close proximity seen within the right hemipelvis on axial image 63, series 2 and coronal image 45 series 5. The proximal small bowel is dilated and fluid-filled. However, the intervening small bowel within the pelvis appears not only dilated and fluid-filled but demonstrates extensive mesenteric edema in keeping with a closed loop obstruction of this segment. The terminal small bowel is decompressed. The colon is unremarkable. The stomach is unremarkable. PERITONEUM AND RETROPERITONEUM: Trace free fluid within the pelvis. No free intraperitoneal gas. No ascites. VASCULATURE: Moderate aortoiliac atherosclerotic calcification. No aortic aneurysm. Aorta is normal in caliber. LYMPH NODES: No lymphadenopathy. REPRODUCTIVE ORGANS: Uterus absent. No adnexal masses. BONES AND SOFT TISSUES: Osseous structures are age appropriate. No acute bone abnormality. No lytic or  blastic bone lesion. IMPRESSION: 1. Complex small bowel obstruction with 2 points of obstruction in close proximity likely related to an underlying adhesion within the right hemipelvis. Resultant closed loop obstruction of the intervening small bowel in the right hemipelvis, evidenced by dilated, fluid-filled intervening small bowel with extensive mesenteric edema. No free intraperitoneal gas. 2. Trace free fluid within the pelvis. 3. These results will be called to the ordering clinician or representative by the radiologist assistant, and communication documented in the pacs or clario dashboard. Electronically signed by: Dorethia Molt MD 08/02/2024 07:40 PM EDT RP Workstation: [id number]   DG ABD ACUTE 2+V W 1V CHEST Result Date: 08/02/2024 CLINICAL DATA:  Left hand small bowel obstruction EXAM: DG ABDOMEN ACUTE WITH 1 VIEW CHEST COMPARISON:  August 01, 2024 FINDINGS: Interval removal of enteric tube. Similar appearance of dilated loops of small bowel in the upper abdomen with persistent air-fluid levels again identified. No free air. Cholecystectomy clips. Unchanged cardiac pacemaker.  No acute osseous findings. IMPRESSION: Interval removal of enteric tube. Similar appearance of dilated loop of small bowel in the upper abdomen with persistent air-fluid levels. Electronically Signed   By: Michaeline Blanch M.D.   On: 08/02/2024 11:23   DG ABD ACUTE 2+V W 1V CHEST Result Date: 08/01/2024 CLINICAL DATA:  Small bowel obstruction EXAM: DG ABDOMEN ACUTE WITH 1 VIEW CHEST COMPARISON:  July 30, 2024 FINDINGS: Unchanged enteric tube with tip in side hole projecting over the stomach. Scattered air-fluid levels with a few dilated loops of small bowel again identified in the upper abdomen, although there is paucity of visualized bowel gas limiting evaluation somewhat. No free air. No focal consolidations. No pleural effusions or pneumothorax. Left chest wall pacemaker. IMPRESSION: Enteric tube in place.  Paucity of  visualized bowel gas, limiting evaluation, although there appears to be persistent dilated loop of bowel in the upper abdomen with scattered air-fluid levels again identified. Electronically Signed   By: Michaeline Blanch M.D.   On: 08/01/2024 10:57   DG Abd 1 View Result Date: 07/30/2024 CLINICAL DATA:  Nasogastric tube placement. EXAM: ABDOMEN - 1 VIEW COMPARISON:  CT earlier today FINDINGS: Tip and side port of the enteric tube below the diaphragm in the stomach. Excreted IV contrast in the renal collecting systems from prior CT. Dilated small bowel in CT is fluid-filled and not well demonstrated by radiograph. IMPRESSION: Tip and side port of the enteric tube below the diaphragm in the stomach. Electronically Signed   By: Andrea Gasman M.D.   On: 07/30/2024 22:48   CT ABDOMEN PELVIS W CONTRAST Result Date: 07/30/2024 CLINICAL DATA:  Abdominal pain. Intractable vomiting. Recent pacemaker placement. Prior cholecystectomy. EXAM: CT ABDOMEN AND PELVIS WITH CONTRAST TECHNIQUE: Multidetector CT imaging of the abdomen and pelvis was performed using the standard protocol following bolus administration of intravenous contrast. RADIATION DOSE REDUCTION: This exam was performed according to the departmental dose-optimization program which includes automated exposure control, adjustment of the mA and/or kV according to patient size and/or use of iterative reconstruction technique. CONTRAST:  OMNIPAQUE  IOHEXOL  300 MG/ML  SOLN COMPARISON:  Noncontrast CT 12/11/2014 FINDINGS: Lower chest: Pacemaker leads overlie the right atrium and ventricle. No pericardial effusion. No basilar airspace disease or pleural effusion. Coronary artery calcifications. Hepatobiliary: No focal liver abnormality. Cholecystectomy. Mild central intrahepatic biliary ductal dilatation. No common bile duct dilatation. Pancreas: No ductal dilatation or inflammation. Spleen: Normal in size without focal abnormality. Adrenals/Urinary Tract: No  adrenal nodule. No hydronephrosis. No renal calculi. No suspicious renal abnormality. Partially distended urinary bladder, normal for degree of distension. Stomach/Bowel: Dilated fluid-filled stomach. Dilated fluid-filled small bowel. Suspected transition point in the right lower quadrant, series 2, image 61, the distal small bowel is decompressed. There is mesenteric edema and free fluid. No bowel pneumatosis. Small to moderate volume of stool in the colon. No colonic inflammation. Normal appendix is potentially visualized. Vascular/Lymphatic: Aortic atherosclerosis and tortuosity. No aortic aneurysm. Patent portal vein. No portal venous or mesenteric gas. No suspicious lymphadenopathy. Reproductive: Normal for age. Other: Generalized mesenteric edema. Free fluid in the mesentery, abdomen and pelvis. No free air or focal fluid collection. Musculoskeletal: Multilevel degenerative change in the spine. There are no acute or suspicious osseous abnormalities. IMPRESSION: 1. Small bowel obstruction with suspected transition point in the right lower quadrant. Mesenteric edema and free fluid. 2. Aortic Atherosclerosis (ICD10-I70.0). Electronically Signed   By: Andrea Gasman M.D.   On: 07/30/2024 20:27   DG Chest 2 View Result Date: 07/24/2024 CLINICAL DATA:  Pacemaker. EXAM: CHEST - 2 VIEW COMPARISON:  Radiograph 07/05/2024 FINDINGS: Dual lead left-sided pacemaker with lead tips projecting over the right atrium and ventricle.The cardiomediastinal contours are normal. Aortic atherosclerosis. Unchanged elevation of right hemidiaphragm. Pulmonary vasculature is normal. No consolidation, pleural effusion, or pneumothorax. Chronic distal left clavicle deformity. No acute osseous abnormalities are seen. IMPRESSION: Dual lead left-sided pacemaker with lead tips projecting over the right atrium and ventricle. No pneumothorax. Electronically Signed   By: Andrea Gasman M.D.   On: 07/24/2024 18:38   EP PPM/ICD  IMPLANT Result Date: 07/24/2024  CONCLUSIONS:  1. Symptomatic bradycardia due to tachycardia-bradycardia syndrome  2. Successful dual chamber permanent pacemaker implantation  3.  No early apparent complications.  4. Hold Eliquis  and Aspirin  for  5 days (OK to restart July 30, 2024)    ECHO 05/2024: NORMAL LEFT VENTRICULAR SYSTOLIC FUNCTION WITH MILD LVH  ESTIMATED EF: 55%  NORMAL LA PRESSURES WITH DIASTOLIC DYSFUNCTION (GRADE 1)  NORMAL RIGHT VENTRICULAR SYSTOLIC FUNCTION  VALVULAR REGURGITATION: No AR, MILD MR, No PR, MILD TR  ESTIMATED RVSP: 36 mmHg (Normal)  NO VALVULAR STENOSIS   TELEMETRY reviewed by me 08/07/2024: atrial fibrillation rate 100s  EKG reviewed by me: NSR rate 60 bpm  Data reviewed by me 08/07/2024: last 24h vitals tele labs imaging I/O hospitalist progress note, general surgery notes  Principal Problem:   SBO (small bowel obstruction) (HCC) Active Problems:   HTN (hypertension)   Acquired hypothyroidism   Coronary artery disease with history of NSTEMI   Status cardiac pacemaker 07/24/2024 for sinus pause with syncope   Paroxysmal atrial fibrillation (HCC)   Chronic anticoagulation   Small bowel obstruction (HCC)    ASSESSMENT AND PLAN:  DARRION MACAULAY is a 89 y.o. female  with a past medical history of  persistent atrial fibrillation (DCCV 02/2023, on Tikosyn ), hypertension, hyperlipidemia, history bradycardia, history NSTEMI (10/2020), coronary artery disease s/p stent to RCA (10/2020) who presented to the ED on 07/30/2024 for intractable vomiting and abdominal pain. Admitted for small bowel obstruction/ileus. 08/04/2024 HR elevated, AF RVR. Cardiology was consulted for further evaluation.   # Small bowel obstruction s/p ex lap and resection 08/03/24 # Atrial fibrillation RVR # Paroxysmal atrial fibrillation # Coronary artery disease # Hypertension Patient initially presented with nausea/vomiting related to small bowel obstruction.  Underwent exploratory  laparotomy yesterday for small bowel resection.  Has a history of atrial fibrillation, underwent cardioversion last year and had been maintaining sinus rhythm on amiodarone  at home.  Developed atrial fibrillation RVR this morning.  Remains NPO. - Continue IV amiodarone  for rhythm control. Transition to PO once able to tolerate. - Continue heparin  for anticoagulation, transition to eliquis  once able to take PO. - Has as needed IV metoprolol  for elevated heart rate as well as as needed IV hydralazine  for elevated BP.  This patient's plan of care was discussed and created with Dr. Florencio and he is in agreement.  Signed: Danita Bloch, PA-C  08/07/2024, 9:49 AM [facility]

## 2024-08-08 DIAGNOSIS — K56609 Unspecified intestinal obstruction, unspecified as to partial versus complete obstruction: Secondary | ICD-10-CM | POA: Diagnosis not present

## 2024-08-08 LAB — GLUCOSE, CAPILLARY
Glucose-Capillary: 110 mg/dL — ABNORMAL HIGH (ref 70–99)
Glucose-Capillary: 116 mg/dL — ABNORMAL HIGH (ref 70–99)
Glucose-Capillary: 125 mg/dL — ABNORMAL HIGH (ref 70–99)
Glucose-Capillary: 126 mg/dL — ABNORMAL HIGH (ref 70–99)
Glucose-Capillary: 138 mg/dL — ABNORMAL HIGH (ref 70–99)

## 2024-08-08 LAB — CBC
HCT: 30.6 % — ABNORMAL LOW (ref 36.0–46.0)
Hemoglobin: 10.1 g/dL — ABNORMAL LOW (ref 12.0–15.0)
MCH: 31.1 pg (ref 26.0–34.0)
MCHC: 33 g/dL (ref 30.0–36.0)
MCV: 94.2 fL (ref 80.0–100.0)
Platelets: 197 K/uL (ref 150–400)
RBC: 3.25 MIL/uL — ABNORMAL LOW (ref 3.87–5.11)
RDW: 14.3 % (ref 11.5–15.5)
WBC: 12.8 K/uL — ABNORMAL HIGH (ref 4.0–10.5)
nRBC: 0 % (ref 0.0–0.2)

## 2024-08-08 LAB — BASIC METABOLIC PANEL WITH GFR
Anion gap: 7 (ref 5–15)
BUN: 34 mg/dL — ABNORMAL HIGH (ref 8–23)
CO2: 21 mmol/L — ABNORMAL LOW (ref 22–32)
Calcium: 9.3 mg/dL (ref 8.9–10.3)
Chloride: 109 mmol/L (ref 98–111)
Creatinine, Ser: 0.59 mg/dL (ref 0.44–1.00)
GFR, Estimated: >60 mL/min (ref >60)
Glucose, Bld: 131 mg/dL — ABNORMAL HIGH (ref 70–99)
Potassium: 4.3 mmol/L (ref 3.5–5.1)
Sodium: 137 mmol/L (ref 135–145)

## 2024-08-08 LAB — HEMOGLOBIN AND HEMATOCRIT, BLOOD
HCT: 27.3 % — ABNORMAL LOW (ref 36.0–46.0)
Hemoglobin: 8.9 g/dL — ABNORMAL LOW (ref 12.0–15.0)

## 2024-08-08 MED ORDER — TRAVASOL 10 % IV SOLN
INTRAVENOUS | Status: AC
  Filled 2024-08-08: qty 840

## 2024-08-08 MED ORDER — METOPROLOL TARTRATE 25 MG PO TABS: 25.0000 mg | ORAL_TABLET | Freq: Two times a day (BID) | ORAL | Status: DC

## 2024-08-08 MED ORDER — METOPROLOL TARTRATE 25 MG PO TABS
12.5000 mg | ORAL_TABLET | Freq: Two times a day (BID) | ORAL | Status: DC
  Administered 2024-08-08 (×2): 12.5 mg via ORAL
  Filled 2024-08-08 (×2): qty 1

## 2024-08-08 MED ORDER — ACETAMINOPHEN 500 MG PO TABS
1000.0000 mg | ORAL_TABLET | Freq: Four times a day (QID) | ORAL | Status: DC
  Administered 2024-08-08 – 2024-08-12 (×15): 1000 mg via ORAL
  Filled 2024-08-08 (×15): qty 2

## 2024-08-08 NOTE — Progress Notes (Signed)
 [facility] CARDIOLOGY PROGRESS NOTE       Patient ID: Maria Trujillo MRN: [id number] DOB/AGE: 06/24/1934 89 y.o.  Admit date: 07/30/2024 Referring Physician Dr. Amaryllis Dare Primary Physician Feldpausch, Cheryl BRAVO, MD  Primary Cardiologist Dr. Wilburn Reason for Consultation AF RVR  HPI: Maria Trujillo is a 89 y.o. female  with a past medical history of  persistent atrial fibrillation (DCCV 02/2023, on Tikosyn ), hypertension, hyperlipidemia, history bradycardia, history NSTEMI (10/2020), coronary artery disease s/p stent to RCA (10/2020) who presented to the ED on 07/30/2024 for intractable vomiting and abdominal pain. Admitted for small bowel obstruction/ileus. 08/04/2024 HR elevated, AF RVR. Cardiology was consulted for further evaluation.   Interval history:  -Patient seen and examined this AM, daughter at bedside.  -Patient did not sleep much overnight. With nausea and vomiting this AM, multiple bloody BMs overnight.  -HR around 100s on tele, remains on amio gtt.   Review of systems complete and found to be negative unless listed above    Past Medical History:  Diagnosis Date   Arthritis    Atrial fibrillation (HCC)    History of kidney stones    Hypothyroidism    Myocardial infarct (HCC)    PONV (postoperative nausea and vomiting)     Past Surgical History:  Procedure Laterality Date   BOWEL RESECTION N/A 08/03/2024   Procedure: EXCISION, SMALL INTESTINE;  Surgeon: Jordis Laneta FALCON, MD;  Location: [facility] ORS;  Service: General;  Laterality: N/A;   BREAST EXCISIONAL BIOPSY Bilateral 1984   benign   CARDIOVERSION N/A 03/04/2023   Procedure: CARDIOVERSION;  Surgeon: Ammon Blunt, MD;  Location: [facility] ORS;  Service: Cardiovascular;  Laterality: N/A;   CATARACT EXTRACTION, BILATERAL     CHOLECYSTECTOMY     EYE SURGERY     JOINT REPLACEMENT Left 12/2012   knee   KNEE ARTHROPLASTY Right 02/15/2017   Procedure: COMPUTER ASSISTED TOTAL KNEE ARTHROPLASTY;  Surgeon: Lynwood SHAUNNA Hue, MD;  Location: [facility] ORS;  Service: Orthopedics;  Laterality: Right;   LAPAROTOMY N/A 08/03/2024   Procedure: LAPAROTOMY, EXPLORATORY;  Surgeon: Jordis Laneta FALCON, MD;  Location: [facility] ORS;  Service: General;  Laterality: N/A;   LEFT HEART CATH AND CORONARY ANGIOGRAPHY N/A 11/04/2020   Procedure: LEFT HEART CATH AND CORONARY ANGIOGRAPHY;  Surgeon: Ammon Blunt, MD;  Location: [facility] INVASIVE CV LAB;  Service: Cardiovascular;  Laterality: N/A;   PACEMAKER IMPLANT N/A 07/24/2024   Procedure: PACEMAKER IMPLANT;  Surgeon: Cindie Ole DASEN, MD;  Location: [facility] INVASIVE CV LAB;  Service: Cardiovascular;  Laterality: N/A;   PHOTOCOAGULATION WITH LASER Right 04/20/2018   Procedure: TRANSCLERAL DIODE CYCLOPHOTOCOAGULATION  RIGHT per Hope block;  Surgeon: Mittie Gaskin, MD;  Location: [facility];  Service: Ophthalmology;  Laterality: Right;   ROTATOR CUFF REPAIR Left 2010   TONSILLECTOMY     VENTRAL HERNIA REPAIR N/A 08/03/2024   Procedure: REPAIR, HERNIA, VENTRAL;  Surgeon: Jordis Laneta FALCON, MD;  Location: [facility] ORS;  Service: General;  Laterality: N/A;    Medications Prior to Admission  Medication Sig Dispense Refill Last Dose/Taking   acetaminophen  (TYLENOL ) 650 MG CR tablet Take 650 mg by mouth daily as needed for pain.   Taking As Needed   amiodarone  (PACERONE ) 200 MG tablet Take 2 tablets (400 mg total) by mouth 2 (two) times daily for 10 days, THEN 1 tablet (200 mg total) daily. (Patient taking differently:  (200 mg total) daily.) 70 tablet 0 07/30/2024 Morning   apixaban  (ELIQUIS ) 5 MG TABS tablet Take 1  tablet (5 mg total) by mouth 2 (two) times daily. 180 tablet 3 07/30/2024 Morning   atorvastatin  (LIPITOR ) 40 MG tablet Take 40 mg by mouth at bedtime.   07/29/2024 Bedtime   azelastine  (ASTELIN ) 0.1 % nasal spray Place 1 spray into both nostrils daily.   07/30/2024 Morning   bimatoprost (LUMIGAN) 0.03 % ophthalmic solution Place 1 drop into the right eye at bedtime.   07/29/2024  Bedtime   Calcium  Carb-Cholecalciferol  (CALCIUM  CARBONATE-VITAMIN D3) 600-400 MG-UNIT TABS Take 1 tablet by mouth daily.   07/30/2024 Morning   Carboxymethylcellulose Sodium (THERATEARS) 0.25 % SOLN Place 1 drop into both eyes daily as needed (Dry eyes).   Taking As Needed   Cholecalciferol  (VITAMIN D ) 50 MCG (2000 UT) tablet Take 2,000 Units by mouth daily.   07/30/2024 Morning   Docusate Sodium  100 MG capsule Take 100 mg by mouth daily as needed for mild constipation or moderate constipation.   Taking As Needed   fexofenadine (ALLEGRA) 180 MG tablet Take 180 mg by mouth at bedtime.   07/29/2024 Bedtime   folic acid (FOLVITE) 400 MCG tablet Take 400 mcg by mouth daily.   Taking   ipratropium (ATROVENT) 0.06 % nasal spray Place 1 spray into both nostrils daily as needed for rhinitis.   Taking As Needed   isosorbide  mononitrate (IMDUR ) 30 MG 24 hr tablet Take 1 tablet (30 mg total) by mouth daily. 30 tablet 2 07/30/2024 Morning   levothyroxine  (SYNTHROID , LEVOTHROID) 100 MCG tablet Take 100 mcg by mouth daily before breakfast.   07/30/2024 Morning   lisinopril  (ZESTRIL ) 5 MG tablet Take 1 tablet (5 mg total) by mouth daily. 30 tablet 2 07/30/2024 Morning   LUMIGAN 0.01 % SOLN Place 1 drop into the right eye at bedtime.   Taking   metoprolol  tartrate (LOPRESSOR ) 25 MG tablet Take 0.5 tablets (12.5 mg total) by mouth daily as needed. for heart rate over 100.  Home med. 10 tablet 1 Unknown   Multiple Vitamin (MULTIVITAMIN WITH MINERALS) TABS tablet Take 1 tablet by mouth daily.   07/30/2024 Morning   Multiple Vitamins-Minerals (PRESERVISION AREDS 2 PO) Take 2 tablets by mouth 2 (two) times daily.   Taking   pantoprazole  (PROTONIX ) 40 MG tablet Take 40 mg by mouth daily.   07/30/2024 Morning   prednisoLONE acetate (PRED FORTE) 1 % ophthalmic suspension Place 1 drop into the right eye 4 (four) times daily.   Taking   timolol  (TIMOPTIC ) 0.5 % ophthalmic solution Place 1 drop into the right eye in the morning.    07/30/2024 Morning   vitamin B-12 (CYANOCOBALAMIN ) 100 MCG tablet Take 100 mcg by mouth daily.   07/30/2024 Morning   furosemide  (LASIX ) 20 MG tablet Take 20 mg by mouth daily. prn (Patient not taking: Reported on 07/21/2024)      Social History   Socioeconomic History   Marital status: Widowed    Spouse name: Not on file   Number of children: Not on file   Years of education: Not on file   Highest education level: Not on file  Occupational History   Not on file  Tobacco Use   Smoking status: Never   Smokeless tobacco: Never  Vaping Use   Vaping status: Never Used  Substance and Sexual Activity   Alcohol  use: No   Drug use: No   Sexual activity: Not on file  Other Topics Concern   Not on file  Social History Narrative   Not on file   Social Drivers  of Health   Financial Resource Strain: Low Risk  (01/20/2024)   Received from [facility]   Overall Financial Resource Strain (CARDIA)    Difficulty of Paying Living Expenses: Not hard at all  Food Insecurity: No Food Insecurity (07/30/2024)   Hunger Vital Sign    Worried About Running Out of Food in the Last Year: Never true    Ran Out of Food in the Last Year: Never true  Transportation Needs: No Transportation Needs (07/30/2024)   PRAPARE - Administrator, Civil Service (Medical): No    Lack of Transportation (Non-Medical): No  Physical Activity: Inactive (11/03/2018)   Received from [facility]   Exercise Vital Sign    Days of Exercise per Week: 0 days    Minutes of Exercise per Session: 0 min  Stress: No Stress Concern Present (11/03/2018)   Received from [facility] - Occupational Stress Questionnaire    Feeling of Stress : Not at all  Social Connections: Moderately Isolated (07/30/2024)   Social Connection and Isolation Panel    Frequency of Communication with Friends and Family: Three times a week    Frequency of Social Gatherings with Friends  and Family: Once a week    Attends Religious Services: More than 4 times per year    Active Member of [employer] or Organizations: No    Attends Banker Meetings: Never    Marital Status: Widowed  Intimate Partner Violence: Not At Risk (07/30/2024)   Humiliation, Afraid, Rape, and Kick questionnaire    Fear of Current or Ex-Partner: No    Emotionally Abused: No    Physically Abused: No    Sexually Abused: No    Family History  Problem Relation Age of Onset   Breast cancer Sister 47     Vitals:   08/08/24 0357 08/08/24 0400 08/08/24 0500 08/08/24 0810  BP: 125/71   109/78  Pulse: (!) 48   (!) 103  Resp: 18 18    Temp: (!) 97.4 F (36.3 C)   98.3 F (36.8 C)  TempSrc: Oral     SpO2: 99%   96%  Weight:   71.3 kg   Height:        PHYSICAL EXAM General: Ill-appearing elderly female, well nourished, in no acute distress. HEENT: Normocephalic and atraumatic. NGT in place. Neck: No JVD.  Lungs: Normal respiratory effort on room air. Clear bilaterally to auscultation. No wheezes, crackles, rhonchi.  Heart: Irregularly irregular, elevated rate. Normal S1 and S2 without gallops or murmurs.  Abdomen: Non-distended appearing.  Msk: Normal strength and tone for age. Extremities: Warm and well perfused. No clubbing, cyanosis.  No edema.  Neuro: Alert and oriented X 3. Psych: Answers questions appropriately.   Labs: Basic Metabolic Panel: Recent Labs    08/06/24 0134 08/06/24 0137 08/07/24 0519 08/08/24 0441  NA 134*  --  139 137  K 4.1  --  4.1 4.3  CL 104  --  107 109  CO2 21*  --  23 21*  GLUCOSE 105*  --  117* 131*  BUN 27*  --  30* 34*  CREATININE 0.62  --  0.64 0.59  CALCIUM  8.6*  --  8.7* 9.3  MG  --  2.3 2.2  --   PHOS 2.9  --  2.6  --    Liver Function Tests: Recent Labs    08/06/24 0134 08/07/24 0519  AST  --  28  ALT  --  28  ALKPHOS  --  55  BILITOT  --  0.5  PROT  --  4.8*  ALBUMIN 2.5* 2.1*   No results for input(s): LIPASE, AMYLASE  in the last 72 hours. CBC: Recent Labs    08/07/24 0519 08/08/24 0547  WBC 9.0 12.8*  HGB 9.5* 10.1*  HCT 28.2* 30.6*  MCV 91.3 94.2  PLT 153 197   Cardiac Enzymes: No results for input(s): CKTOTAL, CKMB, CKMBINDEX, TROPONINIHS in the last 72 hours. BNP: No results for input(s): BNP in the last 72 hours. D-Dimer: No results for input(s): DDIMER in the last 72 hours. Hemoglobin A1C: No results for input(s): HGBA1C in the last 72 hours.  Fasting Lipid Panel: Recent Labs    08/07/24 0519  TRIG 143   Thyroid  Function Tests: No results for input(s): TSH, T4TOTAL, T3FREE, THYROIDAB in the last 72 hours.  Invalid input(s): FREET3 Anemia Panel: No results for input(s): VITAMINB12, FOLATE, FERRITIN, TIBC, IRON, RETICCTPCT in the last 72 hours.   Radiology: IR PICC PLACEMENT RIGHT >5 YRS INC IMG GUIDE Result Date: 08/04/2024 INDICATION: 89 year old female with recently placed left subclavian approach cardiac rhythm maintenance device. Unfortunately, she requires TPN and therefore IR is consulted for PICC placement. EXAM: PICC LINE PLACEMENT WITH ULTRASOUND AND FLUOROSCOPIC GUIDANCE MEDICATIONS: None. ANESTHESIA/SEDATION: None. FLUOROSCOPY TIME:  Radiation exposure index: 2.9 mGy, air kerma COMPLICATIONS: SIR Level A - No therapy, no consequence. Inadvertent arterial puncture by the APP initially performing the procedure. The complication was recognized immediately and pressure held for 25 minutes. No evidence of hematoma. Physician then took over and completed placement of PICC. PROCEDURE: The patient was advised of the possible risks and complications and agreed to undergo the procedure. The patient was then brought to the angiographic suite for the procedure. The right arm was prepped with chlorhexidine , draped in the usual sterile fashion using maximum barrier technique (cap and mask, sterile gown, sterile gloves, large sterile sheet, hand hygiene and  cutaneous antisepsis) and infiltrated locally with 1% Lidocaine . Ultrasound demonstrated patency of the right brachial vein, and this was documented with an image. Under real-time ultrasound guidance, this vein was accessed with a 21 gauge micropuncture needle and image documentation was performed. A 0.018 wire was introduced in to the vein. Over this, a 5 [place] dual lumen power-injectable PICC was advanced to the lower SVC. Fluoroscopy during the procedure and fluoro spot radiograph confirms appropriate catheter position. The catheter was flushed and covered with a sterile dressing. Catheter length: 32 cm IMPRESSION: Successful right arm Power PICC line placement with ultrasound and fluoroscopic guidance. The catheter is ready for use. Electronically Signed   By: Wilkie Lent M.D.   On: 08/04/2024 16:44   US  EKG SITE RITE Result Date: 08/03/2024 If Site Rite image not attached, placement could not be confirmed due to current cardiac rhythm.  DG Abd 1 View Result Date: 08/02/2024 CLINICAL DATA:  Nasogastric tube placement EXAM: ABDOMEN - 1 VIEW COMPARISON:  None Available. FINDINGS: No nasogastric tube is identified within visualized lung bases and upper abdomen. Dilated fluid-filled loops of bowel noted on prior CT examination is not well appreciated on this exam. No free intraperitoneal gas. Pacemaker in place. IMPRESSION: 1. No nasogastric tube identified. Correlate for malposition within the oropharynx is recommended. Electronically Signed   By: Dorethia Molt M.D.   On: 08/02/2024 23:33   CT ABDOMEN PELVIS W CONTRAST Result Date: 08/02/2024 EXAM: CT ABDOMEN AND PELVIS WITH CONTRAST 08/02/2024 07:14:10  PM TECHNIQUE: CT of the abdomen and pelvis was performed with the administration of intravenous contrast. Multiplanar reformatted images are provided for review. Automated exposure control, iterative reconstruction, and/or weight-based adjustment of the mA/kV was utilized to reduce the radiation  dose to as low as reasonably achievable. COMPARISON: None available. CLINICAL HISTORY: Abdominal pain, acute, nonlocalized. 89 y.o. female with medical history significant for CAD with prior NSTEMI, HTN, HLD, persistent A-fib on Eliquis  and amiodarone , complicated by post conversion pauses/bradycardia with syncope 04/2024 s/p pacemaker on 07/24/2024 -(Eliquis  held 9/26 to 9/30), being admitted for small bowel obstruction. She presented with intractable nonbloody, nonbilious, non-coffee-ground vomiting starting on the night prior to arrival associated with lower abdominal pain. FINDINGS: LOWER CHEST: Pacemaker leads within the right heart. Cardiac size within normal limits. Moderate coronary artery calcification. LIVER: The liver is unremarkable. GALLBLADDER AND BILE DUCTS: Status post cholecystectomy. SPLEEN: No acute abnormality. PANCREAS: No acute abnormality. ADRENAL GLANDS: No acute abnormality. KIDNEYS, URETERS AND BLADDER: No stones in the kidneys or ureters. No hydronephrosis. No perinephric or periureteral stranding. Urinary bladder is unremarkable. GI AND BOWEL: There was a complex small bowel obstruction with 2 points of transitioning close proximity seen within the right hemipelvis on axial image 63, series 2 and coronal image 45 series 5. The proximal small bowel is dilated and fluid-filled. However, the intervening small bowel within the pelvis appears not only dilated and fluid-filled but demonstrates extensive mesenteric edema in keeping with a closed loop obstruction of this segment. The terminal small bowel is decompressed. The colon is unremarkable. The stomach is unremarkable. PERITONEUM AND RETROPERITONEUM: Trace free fluid within the pelvis. No free intraperitoneal gas. No ascites. VASCULATURE: Moderate aortoiliac atherosclerotic calcification. No aortic aneurysm. Aorta is normal in caliber. LYMPH NODES: No lymphadenopathy. REPRODUCTIVE ORGANS: Uterus absent. No adnexal masses. BONES AND SOFT  TISSUES: Osseous structures are age appropriate. No acute bone abnormality. No lytic or blastic bone lesion. IMPRESSION: 1. Complex small bowel obstruction with 2 points of obstruction in close proximity likely related to an underlying adhesion within the right hemipelvis. Resultant closed loop obstruction of the intervening small bowel in the right hemipelvis, evidenced by dilated, fluid-filled intervening small bowel with extensive mesenteric edema. No free intraperitoneal gas. 2. Trace free fluid within the pelvis. 3. These results will be called to the ordering clinician or representative by the radiologist assistant, and communication documented in the pacs or clario dashboard. Electronically signed by: Dorethia Molt MD 08/02/2024 07:40 PM EDT RP Workstation: [id number]   DG ABD ACUTE 2+V W 1V CHEST Result Date: 08/02/2024 CLINICAL DATA:  Left hand small bowel obstruction EXAM: DG ABDOMEN ACUTE WITH 1 VIEW CHEST COMPARISON:  August 01, 2024 FINDINGS: Interval removal of enteric tube. Similar appearance of dilated loops of small bowel in the upper abdomen with persistent air-fluid levels again identified. No free air. Cholecystectomy clips. Unchanged cardiac pacemaker.  No acute osseous findings. IMPRESSION: Interval removal of enteric tube. Similar appearance of dilated loop of small bowel in the upper abdomen with persistent air-fluid levels. Electronically Signed   By: Michaeline Blanch M.D.   On: 08/02/2024 11:23   DG ABD ACUTE 2+V W 1V CHEST Result Date: 08/01/2024 CLINICAL DATA:  Small bowel obstruction EXAM: DG ABDOMEN ACUTE WITH 1 VIEW CHEST COMPARISON:  July 30, 2024 FINDINGS: Unchanged enteric tube with tip in side hole projecting over the stomach. Scattered air-fluid levels with a few dilated loops of small bowel again identified in the upper abdomen, although there is paucity of visualized bowel  gas limiting evaluation somewhat. No free air. No focal consolidations. No pleural effusions or  pneumothorax. Left chest wall pacemaker. IMPRESSION: Enteric tube in place. Paucity of visualized bowel gas, limiting evaluation, although there appears to be persistent dilated loop of bowel in the upper abdomen with scattered air-fluid levels again identified. Electronically Signed   By: Michaeline Blanch M.D.   On: 08/01/2024 10:57   DG Abd 1 View Result Date: 07/30/2024 CLINICAL DATA:  Nasogastric tube placement. EXAM: ABDOMEN - 1 VIEW COMPARISON:  CT earlier today FINDINGS: Tip and side port of the enteric tube below the diaphragm in the stomach. Excreted IV contrast in the renal collecting systems from prior CT. Dilated small bowel in CT is fluid-filled and not well demonstrated by radiograph. IMPRESSION: Tip and side port of the enteric tube below the diaphragm in the stomach. Electronically Signed   By: Andrea Gasman M.D.   On: 07/30/2024 22:48   CT ABDOMEN PELVIS W CONTRAST Result Date: 07/30/2024 CLINICAL DATA:  Abdominal pain. Intractable vomiting. Recent pacemaker placement. Prior cholecystectomy. EXAM: CT ABDOMEN AND PELVIS WITH CONTRAST TECHNIQUE: Multidetector CT imaging of the abdomen and pelvis was performed using the standard protocol following bolus administration of intravenous contrast. RADIATION DOSE REDUCTION: This exam was performed according to the departmental dose-optimization program which includes automated exposure control, adjustment of the mA and/or kV according to patient size and/or use of iterative reconstruction technique. CONTRAST:  OMNIPAQUE  IOHEXOL  300 MG/ML  SOLN COMPARISON:  Noncontrast CT 12/11/2014 FINDINGS: Lower chest: Pacemaker leads overlie the right atrium and ventricle. No pericardial effusion. No basilar airspace disease or pleural effusion. Coronary artery calcifications. Hepatobiliary: No focal liver abnormality. Cholecystectomy. Mild central intrahepatic biliary ductal dilatation. No common bile duct dilatation. Pancreas: No ductal dilatation or  inflammation. Spleen: Normal in size without focal abnormality. Adrenals/Urinary Tract: No adrenal nodule. No hydronephrosis. No renal calculi. No suspicious renal abnormality. Partially distended urinary bladder, normal for degree of distension. Stomach/Bowel: Dilated fluid-filled stomach. Dilated fluid-filled small bowel. Suspected transition point in the right lower quadrant, series 2, image 61, the distal small bowel is decompressed. There is mesenteric edema and free fluid. No bowel pneumatosis. Small to moderate volume of stool in the colon. No colonic inflammation. Normal appendix is potentially visualized. Vascular/Lymphatic: Aortic atherosclerosis and tortuosity. No aortic aneurysm. Patent portal vein. No portal venous or mesenteric gas. No suspicious lymphadenopathy. Reproductive: Normal for age. Other: Generalized mesenteric edema. Free fluid in the mesentery, abdomen and pelvis. No free air or focal fluid collection. Musculoskeletal: Multilevel degenerative change in the spine. There are no acute or suspicious osseous abnormalities. IMPRESSION: 1. Small bowel obstruction with suspected transition point in the right lower quadrant. Mesenteric edema and free fluid. 2. Aortic Atherosclerosis (ICD10-I70.0). Electronically Signed   By: Andrea Gasman M.D.   On: 07/30/2024 20:27   DG Chest 2 View Result Date: 07/24/2024 CLINICAL DATA:  Pacemaker. EXAM: CHEST - 2 VIEW COMPARISON:  Radiograph 07/05/2024 FINDINGS: Dual lead left-sided pacemaker with lead tips projecting over the right atrium and ventricle.The cardiomediastinal contours are normal. Aortic atherosclerosis. Unchanged elevation of right hemidiaphragm. Pulmonary vasculature is normal. No consolidation, pleural effusion, or pneumothorax. Chronic distal left clavicle deformity. No acute osseous abnormalities are seen. IMPRESSION: Dual lead left-sided pacemaker with lead tips projecting over the right atrium and ventricle. No pneumothorax.  Electronically Signed   By: Andrea Gasman M.D.   On: 07/24/2024 18:38   EP PPM/ICD IMPLANT Result Date: 07/24/2024  CONCLUSIONS:  1. Symptomatic bradycardia due to  tachycardia-bradycardia syndrome  2. Successful dual chamber permanent pacemaker implantation  3.  No early apparent complications.  4. Hold Eliquis  and Aspirin  for 5 days (OK to restart July 30, 2024)    ECHO 05/2024: NORMAL LEFT VENTRICULAR SYSTOLIC FUNCTION WITH MILD LVH  ESTIMATED EF: 55%  NORMAL LA PRESSURES WITH DIASTOLIC DYSFUNCTION (GRADE 1)  NORMAL RIGHT VENTRICULAR SYSTOLIC FUNCTION  VALVULAR REGURGITATION: No AR, MILD MR, No PR, MILD TR  ESTIMATED RVSP: 36 mmHg (Normal)  NO VALVULAR STENOSIS   TELEMETRY reviewed by me 08/08/2024: atrial fibrillation rate 100s  EKG reviewed by me: NSR rate 60 bpm  Data reviewed by me 08/08/2024: last 24h vitals tele labs imaging I/O hospitalist progress note, general surgery notes  Principal Problem:   SBO (small bowel obstruction) (HCC) Active Problems:   HTN (hypertension)   Acquired hypothyroidism   Coronary artery disease with history of NSTEMI   Status cardiac pacemaker 07/24/2024 for sinus pause with syncope   Paroxysmal atrial fibrillation (HCC)   Chronic anticoagulation   Small bowel obstruction (HCC)    ASSESSMENT AND PLAN:  Maria Trujillo is a 89 y.o. female  with a past medical history of  persistent atrial fibrillation (DCCV 02/2023, on Tikosyn ), hypertension, hyperlipidemia, history bradycardia, history NSTEMI (10/2020), coronary artery disease s/p stent to RCA (10/2020) who presented to the ED on 07/30/2024 for intractable vomiting and abdominal pain. Admitted for small bowel obstruction/ileus. 08/04/2024 HR elevated, AF RVR. Cardiology was consulted for further evaluation.   # Small bowel obstruction s/p ex lap and resection 08/03/24 # Atrial fibrillation RVR # Paroxysmal atrial fibrillation # Coronary artery disease # Hypertension Patient initially  presented with nausea/vomiting related to small bowel obstruction.  Underwent exploratory laparotomy yesterday for small bowel resection.  Has a history of atrial fibrillation, underwent cardioversion last year and had been maintaining sinus rhythm on amiodarone  at home.  Developed atrial fibrillation RVR this morning.  Remains NPO. -Continue IV amiodarone  for rhythm control. Transition to PO once able to tolerate. -Start metoprolol  tartrate 12.5 mg twice daily. -Eliquis  currently held given bloody BMs overnight. -As needed IV metoprolol  for elevated heart rate as well as as needed IV hydralazine  for elevated BP.  This patient's plan of care was discussed and created with Dr. Florencio and he is in agreement.  Signed: Danita Bloch, PA-C  08/08/2024, 9:37 AM [facility]

## 2024-08-08 NOTE — Plan of Care (Signed)
  Problem: Education: Goal: Knowledge of General Education information will improve Description: Including pain rating scale, medication(s)/side effects and non-pharmacologic comfort measures Outcome: Progressing   Problem: Health Behavior/Discharge Planning: Goal: Ability to manage health-related needs will improve Outcome: Progressing   Problem: Clinical Measurements: Goal: Ability to maintain clinical measurements within normal limits will improve Outcome: Progressing   Problem: Nutrition: Goal: Adequate nutrition will be maintained Outcome: Progressing   Problem: Coping: Goal: Level of anxiety will decrease Outcome: Progressing   Problem: Elimination: Goal: Will not experience complications related to bowel motility Outcome: Progressing   Problem: Pain Managment: Goal: General experience of comfort will improve and/or be controlled Outcome: Progressing   Problem: Safety: Goal: Ability to remain free from injury will improve Outcome: Progressing   Problem: Skin Integrity: Goal: Risk for impaired skin integrity will decrease Outcome: Progressing   Problem: Metabolic: Goal: Ability to maintain appropriate glucose levels will improve Outcome: Progressing   Problem: Tissue Perfusion: Goal: Adequacy of tissue perfusion will improve Outcome: Progressing

## 2024-08-08 NOTE — Progress Notes (Addendum)
 Progress Note    Maria Trujillo  FMW:[id number] DOB: 20-Sep-1934  DOA: 07/30/2024 PCP: Jeffie Cheryl BRAVO, MD      Brief Narrative:    Medical records reviewed and are as summarized below:  Maria Trujillo is a 89 y.o. female with medical history significant for CAD with prior NSTEMI, HTN, HLD, persistent A-fib on Eliquis  and amiodarone , complicated by post conversion pauses/bradycardia with syncope 04/2024 s/p pacemaker on 07/24/2024 -(Eliquis  held 9/26 to 9/30).  She presented to the hospital because of vomiting and abdominal pain.  She was admitted to the hospital for small bowel obstruction.  NG tube was placed and general surgery was consulted.  NG tube was placed for gastric decompression.  NG tube was subsequently removed and patient was started on clear liquid diet.  However, she failed Gastrografin challenge.  She was taken to the OR for exploratory laparotomy with reduction of internal hernia and resection of small bowel with primary anastomosis and lysis of adhesion on 08/03/2024.   Assessment/Plan:   Principal Problem:   SBO (small bowel obstruction) (HCC) Active Problems:   Coronary artery disease with history of NSTEMI   Status cardiac pacemaker 07/24/2024 for sinus pause with syncope   Paroxysmal atrial fibrillation (HCC)   Chronic anticoagulation   HTN (hypertension)   Acquired hypothyroidism   Small bowel obstruction (HCC)   Nutrition Problem: Inadequate oral intake Etiology: altered GI function  Signs/Symptoms: NPO status   Body mass index is 26.98 kg/m.   Small bowel obstruction: Initially failed conservative management.  S/p ex lap, reduction of internal hernia, small bowel resection with primary anastomosis and repair of ventral hernia on 08/03/2024. NG tube was removed on 08/07/2024. She is on full liquid diet.  She is also on TPN for nutritional support.   Maroon-colored stools, concern for acute GI bleed: Eliquis  has been held.  Discontinue Toradol.   Continue IV Protonix . Acute blood loss anemia: Hemoglobin repeated this morning.  Hemoglobin dropped from 10.1-8.9.  No indication for blood transfusion at this time.  Monitor H&H and transfuse as needed.   Paroxysmal atrial fibrillation with RVR: She is on IV amiodarone  drip.  Plan to switch to oral amiodarone  home if oral intake stabilizes. She has not been started on oral metoprolol .  Follow-up with cardiologist.   Eliquis  on hold.   CAD with previous NSTEMI, s/p cardiac pacemaker on 07/24/2024 for sinus pause with syncope   Mild hyponatremia: Improved   Comorbidities include hypertension, hypothyroidism   Case discussed with Dr. Jordis (surgeon) and Arthea, [place], with surgical team   Diet Order             Diet full liquid Room service appropriate? Yes; Fluid consistency: Thin  Diet effective 1400                                  Consultants: Cardiologist General Surgeon  Procedures:  S/p ex lap, reduction of internal hernia, small bowel resection with primary anastomosis and repair of ventral hernia on 08/03/2024. PICC line placement in the right brachial vein on 08/04/2024 by IR    Medications:    acetaminophen   1,000 mg Oral Q6H   Chlorhexidine  Gluconate Cloth  6 each Topical Q0600   feeding supplement  237 mL Oral TID BM   insulin aspart  0-9 Units Subcutaneous TID WC   latanoprost   1 drop Right Eye QHS   levothyroxine   100  mcg Oral QAC breakfast   metoprolol  tartrate  12.5 mg Oral BID   pantoprazole  (PROTONIX ) IV  40 mg Intravenous Q24H   thiamine (VITAMIN B1) injection  100 mg Intravenous Daily   timolol   1 drop Right Eye Daily   Continuous Infusions:  amiodarone  30 mg/hr (08/08/24 0959)   promethazine  (PHENERGAN ) injection (IM or IVPB) 150 mL/hr at 08/07/24 0547   TPN ADULT (ION) 70 mL/hr at 08/08/24 0012   TPN ADULT (ION)       Anti-infectives (From admission, onward)    Start     Dose/Rate Route Frequency Ordered Stop    08/03/24 2000  ciprofloxacin (CIPRO) IVPB 400 mg        400 mg 200 mL/hr over 60 Minutes Intravenous Every 12 hours 08/03/24 1059 08/04/24 1100   08/03/24 2000  metroNIDAZOLE (FLAGYL) IVPB 500 mg        500 mg 100 mL/hr over 60 Minutes Intravenous Every 12 hours 08/03/24 1059 08/04/24 1240   08/03/24 0700  ciprofloxacin (CIPRO) IVPB 400 mg        400 mg 200 mL/hr over 60 Minutes Intravenous  Once 08/02/24 1713 08/03/24 0734   08/03/24 0700  metroNIDAZOLE (FLAGYL) IVPB 500 mg        500 mg 100 mL/hr over 60 Minutes Intravenous  Once 08/02/24 1713 08/03/24 0734   07/30/24 2000  levofloxacin (LEVAQUIN) tablet 250 mg  Status:  Discontinued        250 mg Oral  Once 07/30/24 1958 07/30/24 1959   07/30/24 2000  levofloxacin (LEVAQUIN) tablet 750 mg        750 mg Oral  Once 07/30/24 1959 07/30/24 2021              Family Communication/Anticipated D/C date and plan/Code Status   DVT prophylaxis:      Code Status: Full Code  Family Communication: Plan discussed with Tracey, daughter, at the bedside Disposition Plan: Plan to discharge home versus SNF   Status is: Inpatient Remains inpatient appropriate because: Ex lap for small bowel obstruction       Subjective:   Interval events noted.  She feels worse today.  She vomited twice this morning.  Vomitus was greenish in color.  She also had a bowel for maroon-colored stools this morning.  No other complaints.  Daughter was at the bedside.  Objective:    Vitals:   08/08/24 0400 08/08/24 0500 08/08/24 0810 08/08/24 1208  BP:   109/78 93/64  Pulse:   (!) 103 81  Resp: 18     Temp:   98.3 F (36.8 C) 97.9 F (36.6 C)  TempSrc:      SpO2:   96% 97%  Weight:  71.3 kg    Height:       No data found.   Intake/Output Summary (Last 24 hours) at 08/08/2024 1302 Last data filed at 08/08/2024 0012 Gross per 24 hour  Intake 1946.84 ml  Output --  Net 1946.84 ml   Filed Weights   08/06/24 0449 08/07/24 0500 08/08/24  0500  Weight: 74.2 kg 72.3 kg 71.3 kg    Exam:  GEN: NAD SKIN: Warm and dry EYES: No pallor or icterus ENT: MMM CV: RRR PULM: CTA B ABD: soft, ND, NT, +BS, surgical incision is clean, dry and intact with staples CNS: AAO x 3, non focal EXT: No edema or tenderness       Data Reviewed:   I have personally reviewed following labs and imaging studies:  Labs: Labs show the following:   Basic Metabolic Panel: Recent Labs  Lab 08/03/24 0330 08/04/24 0504 08/05/24 0351 08/06/24 0134 08/06/24 0137 08/07/24 0519 08/08/24 0441  NA 135 132* 132* 134*  --  139 137  K 3.6 3.8 4.0 4.1  --  4.1 4.3  CL 102 103 102 104  --  107 109  CO2 23 22 19* 21*  --  23 21*  GLUCOSE 113* 112* 119* 105*  --  117* 131*  BUN 33* 32* 31* 27*  --  30* 34*  CREATININE 0.82 0.83 0.78 0.62  --  0.64 0.59  CALCIUM  8.4* 8.7* 8.6* 8.6*  --  8.7* 9.3  MG 2.1 1.8 2.2  --  2.3 2.2  --   PHOS 2.8 3.0 2.2* 2.9  --  2.6  --    GFR Estimated Creatinine Clearance: 47 mL/min (by C-G formula based on SCr of 0.59 mg/dL). Liver Function Tests: Recent Labs  Lab 08/06/24 0134 08/07/24 0519  AST  --  28  ALT  --  28  ALKPHOS  --  55  BILITOT  --  0.5  PROT  --  4.8*  ALBUMIN 2.5* 2.1*   No results for input(s): LIPASE, AMYLASE in the last 168 hours.  No results for input(s): AMMONIA in the last 168 hours. Coagulation profile Recent Labs  Lab 08/04/24 1328  INR 1.4*    CBC: Recent Labs  Lab 08/03/24 0330 08/05/24 0351 08/06/24 0137 08/07/24 0519 08/08/24 0547 08/08/24 1037  WBC 8.7 14.7* 12.7* 9.0 12.8*  --   HGB 13.0 11.9* 11.1* 9.5* 10.1* 8.9*  HCT 38.9 35.2* 32.2* 28.2* 30.6* 27.3*  MCV 90.5 90.0 89.2 91.3 94.2  --   PLT 167 137* 134* 153 197  --    Cardiac Enzymes: No results for input(s): CKTOTAL, CKMB, CKMBINDEX, TROPONINI in the last 168 hours. BNP (last 3 results) No results for input(s): PROBNP in the last 8760 hours. CBG: Recent Labs  Lab 08/07/24 1639  08/08/24 0016 08/08/24 0635 08/08/24 0806 08/08/24 1209  GLUCAP 101* 116* 126* 138* 125*   D-Dimer: No results for input(s): DDIMER in the last 72 hours. Hgb A1c: No results for input(s): HGBA1C in the last 72 hours.  Lipid Profile: Recent Labs    08/07/24 0519  TRIG 143   Thyroid  function studies: No results for input(s): TSH, T4TOTAL, T3FREE, THYROIDAB in the last 72 hours.  Invalid input(s): FREET3 Anemia work up: No results for input(s): VITAMINB12, FOLATE, FERRITIN, TIBC, IRON, RETICCTPCT in the last 72 hours. Sepsis Labs: Recent Labs  Lab 08/05/24 0351 08/06/24 0137 08/07/24 0519 08/08/24 0547  WBC 14.7* 12.7* 9.0 12.8*    Microbiology Recent Results (from the past 240 hours)  Resp panel by RT-PCR (RSV, Flu A&B, Covid) Anterior Nasal Swab     Status: None   Collection Time: 07/30/24  7:08 PM   Specimen: Anterior Nasal Swab  Result Value Ref Range Status   SARS Coronavirus 2 by RT PCR NEGATIVE NEGATIVE Final    Comment: (NOTE) SARS-CoV-2 target nucleic acids are NOT DETECTED.  The SARS-CoV-2 RNA is generally detectable in upper respiratory specimens during the acute phase of infection. The lowest concentration of SARS-CoV-2 viral copies this assay can detect is 138 copies/mL. A negative result does not preclude SARS-Cov-2 infection and should not be used as the sole basis for treatment or other patient management decisions. A negative result may occur with  improper specimen collection/handling, submission of specimen other than nasopharyngeal  swab, presence of viral mutation(s) within the areas targeted by this assay, and inadequate number of viral copies(<138 copies/mL). A negative result must be combined with clinical observations, patient history, and epidemiological information. The expected result is Negative.  Fact Sheet for Patients:  BloggerCourse.com  Fact Sheet for Healthcare Providers:   SeriousBroker.it  This test is no t yet approved or cleared by the [place]  FDA and  has been authorized for detection and/or diagnosis of SARS-CoV-2 by FDA under an Emergency Use Authorization (EUA). This EUA will remain  in effect (meaning this test can be used) for the duration of the COVID-19 declaration under Section 564(b)(1) of the Act, 21 U.S.C.section 360bbb-3(b)(1), unless the authorization is terminated  or revoked sooner.       Influenza A by PCR NEGATIVE NEGATIVE Final   Influenza B by PCR NEGATIVE NEGATIVE Final    Comment: (NOTE) The Xpert Xpress SARS-CoV-2/FLU/RSV plus assay is intended as an aid in the diagnosis of influenza from Nasopharyngeal swab specimens and should not be used as a sole basis for treatment. Nasal washings and aspirates are unacceptable for Xpert Xpress SARS-CoV-2/FLU/RSV testing.  Fact Sheet for Patients: BloggerCourse.com  Fact Sheet for Healthcare Providers: SeriousBroker.it  This test is not yet approved or cleared by the [place]  FDA and has been authorized for detection and/or diagnosis of SARS-CoV-2 by FDA under an Emergency Use Authorization (EUA). This EUA will remain in effect (meaning this test can be used) for the duration of the COVID-19 declaration under Section 564(b)(1) of the Act, 21 U.S.C. section 360bbb-3(b)(1), unless the authorization is terminated or revoked.     Resp Syncytial Virus by PCR NEGATIVE NEGATIVE Final    Comment: (NOTE) Fact Sheet for Patients: BloggerCourse.com  Fact Sheet for Healthcare Providers: SeriousBroker.it  This test is not yet approved or cleared by the [place]  FDA and has been authorized for detection and/or diagnosis of SARS-CoV-2 by FDA under an Emergency Use Authorization (EUA). This EUA will remain in effect (meaning this test can be used) for  the duration of the COVID-19 declaration under Section 564(b)(1) of the Act, 21 U.S.C. section 360bbb-3(b)(1), unless the authorization is terminated or revoked.  Performed at [facility], [address] [place], [place] [address]     Procedures and diagnostic studies:  No results found.              LOS: 8 days   Danzel Marszalek  [facility]   Pager on [email or web address]. If 7PM-7AM, please contact night-coverage at [email or web address]     08/08/2024, 1:02 PM

## 2024-08-08 NOTE — Progress Notes (Signed)
 PHARMACY - TOTAL PARENTERAL NUTRITION CONSULT NOTE   Indication: Small bowel obstruction  Patient Measurements: Height: 5' 4 (162.6 cm) Weight: 71.3 kg (157 lb 3 oz) IBW/kg (Calculated) : 54.7 TPN AdjBW (KG): 59.6 Body mass index is 26.98 kg/m. Usual Weight: UNK  Assessment: 89yo female admitted with intractable vomiting and small bowel obstruction. Patient has PMH cholecystectomy, CAD, NSTEMI, HTN, HLD, A. Fib, and recent pacemaker. She has been NPO/clear liquids since admission. Patient reports Hives allergy to Shellfish. Allergy was confirmed with family to be to ALL seafood.   Glucose / Insulin: BG 126-138 (1 unit of SSI used in the last 24 hours) Electrolytes: WNL Renal: Scr <1 Hepatic: LFTs WNL Intake / Output; MIVF: D5/1/2NS w/KCL @ 100 ml/hr - Stop time 10/9 @ 1714 GI Imaging:  10/8 CT/ABDOMEN PELVIS Complex small bowel obstruction with 2 points of obstruction  GI Surgeries / Procedures: S/P exploratory laparotomy, small bowel resection, reduction of internal hernia, and repair or ventral hernia on 10/9  Central access: PICC order Placed 10/10 AM TPN start date: 08/04/2024  Nutritional Goals: Goal TPN rate is 70 mL/hr (provides 84g of protein and 1696 kcals per day)  RD Assessment: Estimated Needs Total Energy Estimated Needs: 1600-1800kcal/day Total Protein Estimated Needs: 80-90g/day Total Fluid Estimated Needs: 1.4-1.6L/day  Current Nutrition:  NPO,   Plan:  Continue TPN to goal rate of 70 mL/hr at 1800.  Electrolytes in TPN: Na 75mEq/L, K 23mEq/L, Ca 63mEq/L, Mg 32mEq/L, and Phos 15mmol/L. Cl:Ac 1:1 Due to allergy to ALL Seafoods- INTRALIPIDS used instead of SMOFLIPIDS Add standard MVI and trace elements to TPN Thiamine 100mg  IV x 7 days ordered outside of bag.  Continue Sensitive q6h SSI and adjust as needed  Monitor TPN labs on Mon/Thurs. BMP, Phos, and Mag monitored daily until stable.   Lizann Edelman A Ajeet Casasola, PharmD Clinical  Pharmacist 08/08/2024 10:40 AM

## 2024-08-08 NOTE — Telephone Encounter (Signed)
 Last read by Recardo LITTIE Mayers at 8:10AM on 08/08/2024.

## 2024-08-08 NOTE — Progress Notes (Signed)
 [facility] SURGICAL PROGRESS NOTE  Hospital Day(s): 8.   Post op day(s): 5 Days Post-Op.   Interval History:  Patient seen and examined No acute events or new complaints overnight.  Patient reports she had a rough night Had multiple BM with dark in color; concern for bleeding  Did have some emesis overnight  Leukocytosis is fluctuating; WBC 12.8K Hgb to 10.1; this is improved (from 9.5) Renal function normal; sCr - 0.59; UO - unmeasured  No electrolyte derangements She is passing flatus and had BM; again these are loose and concerning for blood    Vital signs in last 24 hours: [min-max] current  Temp:  [97.4 F (36.3 C)-98.3 F (36.8 C)] 97.4 F (36.3 C) (10/14 0357) Pulse Rate:  [48-99] 48 (10/14 0357) Resp:  [16-18] 18 (10/14 0400) BP: (85-125)/(53-71) 125/71 (10/14 0357) SpO2:  [97 %-99 %] 99 % (10/14 0357) Weight:  [71.3 kg] 71.3 kg (10/14 0500)     Height: 5' 4 (162.6 cm) Weight: 71.3 kg BMI (Calculated): 26.97   Intake/Output last 2 shifts:  10/13 0701 - 10/14 0700 In: 1946.8 [P.O.:360; I.V.:1586.8] Out: -    Physical Exam:  Constitutional: alert, cooperative and no distress  Respiratory: breathing non-labored at rest  Cardiovascular: regular rate and irregular Gastrointestinal: soft, incisional soreness, and non-distended. No rebound/guarding Integumentary: Laparotomy is CDI with staples, no erythema   Labs:     Latest Ref Rng & Units 08/08/2024    5:47 AM 08/07/2024    5:19 AM 08/06/2024    1:37 AM  CBC  WBC 4.0 - 10.5 K/uL 12.8  9.0  12.7   Hemoglobin 12.0 - 15.0 g/dL 89.8  9.5  88.8   Hematocrit 36.0 - 46.0 % 30.6  28.2  32.2   Platelets 150 - 400 K/uL 197  153  134       Latest Ref Rng & Units 08/08/2024    4:41 AM 08/07/2024    5:19 AM 08/06/2024    1:34 AM  CMP  Glucose 70 - 99 mg/dL 868  882  894   BUN 8 - 23 mg/dL 34  30  27   Creatinine 0.44 - 1.00 mg/dL 9.40  9.35  9.37   Sodium 135 - 145 mmol/L 137  139  134    Potassium 3.5 - 5.1 mmol/L 4.3  4.1  4.1   Chloride 98 - 111 mmol/L 109  107  104   CO2 22 - 32 mmol/L 21  23  21    Calcium  8.9 - 10.3 mg/dL 9.3  8.7  8.6   Total Protein 6.5 - 8.1 g/dL  4.8    Total Bilirubin 0.0 - 1.2 mg/dL  0.5    Alkaline Phos 38 - 126 U/L  55    AST 15 - 41 U/L  28    ALT 0 - 44 U/L  28       Imaging studies: No new pertinent imaging studies   Assessment/Plan: 89 y.o. female 5 Days Post-Op s/p exploratory laparotomy, reduction of internal hernia, SBR, and repair of ventral hernia    - Would continue CLD for now given concern for nausea/emesis. If emesis recurs, may need to return to NPO  - Continue TPN for now  - Monitor H&H; improved - repeat this afternoon   - Appreciate cardiology assistance; DC Eliquis  given concern for anemia  - Monitor abdominal examination; on-going bowel function   - Pain control prn; antiemetics prn  - Mobilize; Okay to work with  PT   - Further management per primary service; we will follow    All of the above findings and recommendations were discussed with the patient, patient's family (daughter at bedside), and the medical team, and all of patient's and family's questions were answered to their expressed satisfaction.  -- Arthea Platt, PA-C [facility] 08/08/2024, 7:30 AM M-F: 7am - 4pm

## 2024-08-08 NOTE — Progress Notes (Addendum)
 Patients daughter report 3 bowel movements this shift. She said they started off dark/black, now they are a mixture of black and red. BP 125/71 (BP Location: Left Arm)   Pulse (!) 48   Temp (!) 97.4 F (36.3 C) (Oral)   Resp 18   Ht 5' 4 (1.626 m)   Wt 72.3 kg   SpO2 99%   BMI 27.36 kg/m  Dr Cleatus notified. CBC ordered

## 2024-08-09 DIAGNOSIS — K56609 Unspecified intestinal obstruction, unspecified as to partial versus complete obstruction: Secondary | ICD-10-CM | POA: Diagnosis not present

## 2024-08-09 LAB — COMPREHENSIVE METABOLIC PANEL WITH GFR
ALT: 79 U/L — ABNORMAL HIGH (ref 0–44)
AST: 53 U/L — ABNORMAL HIGH (ref 15–41)
Albumin: 2 g/dL — ABNORMAL LOW (ref 3.5–5.0)
Alkaline Phosphatase: 59 U/L (ref 38–126)
Anion gap: 6 (ref 5–15)
BUN: 23 mg/dL (ref 8–23)
CO2: 21 mmol/L — ABNORMAL LOW (ref 22–32)
Calcium: 8.6 mg/dL — ABNORMAL LOW (ref 8.9–10.3)
Chloride: 109 mmol/L (ref 98–111)
Creatinine, Ser: 0.67 mg/dL (ref 0.44–1.00)
GFR, Estimated: >60 mL/min (ref >60)
Glucose, Bld: 106 mg/dL — ABNORMAL HIGH (ref 70–99)
Potassium: 4.5 mmol/L (ref 3.5–5.1)
Sodium: 136 mmol/L (ref 135–145)
Total Bilirubin: 0.3 mg/dL (ref 0.0–1.2)
Total Protein: 4.6 g/dL — ABNORMAL LOW (ref 6.5–8.1)

## 2024-08-09 LAB — IRON AND TIBC
Iron: 22 ug/dL — ABNORMAL LOW (ref 28–170)
Saturation Ratios: 10 % — ABNORMAL LOW (ref 10.4–31.8)
TIBC: 216 ug/dL — ABNORMAL LOW (ref 250–450)
UIBC: 194 ug/dL

## 2024-08-09 LAB — CBC
HCT: 23.7 % — ABNORMAL LOW (ref 36.0–46.0)
Hemoglobin: 7.8 g/dL — ABNORMAL LOW (ref 12.0–15.0)
MCH: 30.8 pg (ref 26.0–34.0)
MCHC: 32.9 g/dL (ref 30.0–36.0)
MCV: 93.7 fL (ref 80.0–100.0)
Platelets: 188 K/uL (ref 150–400)
RBC: 2.53 MIL/uL — ABNORMAL LOW (ref 3.87–5.11)
RDW: 14.3 % (ref 11.5–15.5)
WBC: 8.8 K/uL (ref 4.0–10.5)
nRBC: 0 % (ref 0.0–0.2)

## 2024-08-09 LAB — GLUCOSE, CAPILLARY
Glucose-Capillary: 102 mg/dL — ABNORMAL HIGH (ref 70–99)
Glucose-Capillary: 106 mg/dL — ABNORMAL HIGH (ref 70–99)
Glucose-Capillary: 113 mg/dL — ABNORMAL HIGH (ref 70–99)
Glucose-Capillary: 115 mg/dL — ABNORMAL HIGH (ref 70–99)
Glucose-Capillary: 96 mg/dL (ref 70–99)

## 2024-08-09 LAB — FERRITIN: Ferritin: 215 ng/mL (ref 11–307)

## 2024-08-09 LAB — PHOSPHORUS: Phosphorus: 3 mg/dL (ref 2.5–4.6)

## 2024-08-09 LAB — MAGNESIUM: Magnesium: 1.9 mg/dL (ref 1.7–2.4)

## 2024-08-09 LAB — PREPARE RBC (CROSSMATCH)

## 2024-08-09 MED ORDER — SODIUM CHLORIDE 0.9% IV SOLUTION: Freq: Once | INTRAVENOUS | Status: AC

## 2024-08-09 MED ORDER — IRON SUCROSE 300 MG IVPB - SIMPLE MED
300.0000 mg | Freq: Once | Status: AC
  Administered 2024-08-09: 300 mg via INTRAVENOUS
  Filled 2024-08-09: qty 300

## 2024-08-09 MED ORDER — TRAVASOL 10 % IV SOLN
INTRAVENOUS | Status: AC
  Filled 2024-08-09: qty 840

## 2024-08-09 MED ORDER — METOPROLOL SUCCINATE ER 25 MG PO TB24
12.5000 mg | ORAL_TABLET | Freq: Every day | ORAL | Status: DC
  Administered 2024-08-10 – 2024-08-11 (×2): 12.5 mg via ORAL
  Filled 2024-08-09 (×3): qty 1

## 2024-08-09 NOTE — Progress Notes (Addendum)
 PT Cancellation Note  Patient Details Name: DARRIEN LAAKSO MRN: [id number] DOB: 04-05-1934   Cancelled Treatment:     Per discussion with OT and per chart review. Will hold PT this date. Pt currently getting blood, soft BP noted and not feeling very well. Will return at a later time and date and continue to follow per current POC.    Rankin KATHEE Essex 08/09/2024, 12:39 PM

## 2024-08-09 NOTE — Progress Notes (Signed)
 [facility] SURGICAL PROGRESS NOTE  Hospital Day(s): 9.   Post op day(s): 6 Days Post-Op.   Interval History:  Patient seen and examined No acute events or new complaints overnight.  Patient, and her daughter, report she had a much better night Expected abdominal soreness No fever, chills, nausea, emesis  Leukocytosis is fluctuating; now normal; WBC 8.8K Hgb to 7.8; continued down trend Renal function normal; sCr - 0.67; UO - unmeasured  No electrolyte derangements No longer with bloody bowel movements as of this AM CLD: no issue   Vital signs in last 24 hours: [min-max] current  Temp:  [97.5 F (36.4 C)-98.4 F (36.9 C)] 97.5 F (36.4 C) (10/15 0749) Pulse Rate:  [78-104] 82 (10/15 0749) Resp:  [16-20] 16 (10/15 0749) BP: (88-110)/(45-78) 91/57 (10/15 0749) SpO2:  [95 %-98 %] 98 % (10/15 0749) Weight:  [72.4 kg] 72.4 kg (10/15 0454)     Height: 5' 4 (162.6 cm) Weight: 72.4 kg BMI (Calculated): 27.38   Intake/Output last 2 shifts:  10/14 0701 - 10/15 0700 In: 2045.6 [I.V.:2045.6] Out: -    Physical Exam:  Constitutional: alert, cooperative and no distress  Respiratory: breathing non-labored at rest  Cardiovascular: regular rate and irregular Gastrointestinal: soft, incisional soreness, and non-distended. No rebound/guarding Integumentary: Laparotomy is CDI with staples, no erythema   Labs:     Latest Ref Rng & Units 08/09/2024    4:56 AM 08/08/2024   10:37 AM 08/08/2024    5:47 AM  CBC  WBC 4.0 - 10.5 K/uL 8.8   12.8   Hemoglobin 12.0 - 15.0 g/dL 7.8  8.9  89.8   Hematocrit 36.0 - 46.0 % 23.7  27.3  30.6   Platelets 150 - 400 K/uL 188   197       Latest Ref Rng & Units 08/09/2024    4:56 AM 08/08/2024    4:41 AM 08/07/2024    5:19 AM  CMP  Glucose 70 - 99 mg/dL 893  868  882   BUN 8 - 23 mg/dL 23  34  30   Creatinine 0.44 - 1.00 mg/dL 9.32  9.40  9.35   Sodium 135 - 145 mmol/L 136  137  139   Potassium 3.5 - 5.1 mmol/L 4.5  4.3  4.1    Chloride 98 - 111 mmol/L 109  109  107   CO2 22 - 32 mmol/L 21  21  23    Calcium  8.9 - 10.3 mg/dL 8.6  9.3  8.7   Total Protein 6.5 - 8.1 g/dL 4.6   4.8   Total Bilirubin 0.0 - 1.2 mg/dL 0.3   0.5   Alkaline Phos 38 - 126 U/L 59   55   AST 15 - 41 U/L 53   28   ALT 0 - 44 U/L 79   28      Imaging studies: No new pertinent imaging studies   Assessment/Plan: 89 y.o. female 6 Days Post-Op s/p exploratory laparotomy, reduction of internal hernia, SBR, and repair of ventral hernia    - She wishes to continue CLD this morning which is reasonable, We can consider FLD later today as tolerated  - Continue TPN for now; await further advancement of diet before weaning   - Monitor H&H; downtrend; transfuse as needed <7, okay if felt warrant from other services   - Appreciate cardiology assistance; Continue to hold Eliquis  given concern for anemia/history of melena  - Monitor abdominal examination; on-going bowel function   -  Pain control prn; antiemetics prn  - Mobilize; Okay to work with PT   - Further management per primary service; we will follow    All of the above findings and recommendations were discussed with the patient, patient's family (daughter at bedside), and the medical team, and all of patient's and family's questions were answered to their expressed satisfaction.  -- Arthea Platt, PA-C [facility] 08/09/2024, 7:52 AM M-F: 7am - 4pm

## 2024-08-09 NOTE — Progress Notes (Signed)
 PHARMACY - TOTAL PARENTERAL NUTRITION CONSULT NOTE   Indication: Small bowel obstruction  Patient Measurements: Height: 5' 4 (162.6 cm) Weight: 72.4 kg (159 lb 9.8 oz) IBW/kg (Calculated) : 54.7 TPN AdjBW (KG): 59.6 Body mass index is 27.4 kg/m. Usual Weight: UNK  Assessment: 89yo female admitted with intractable vomiting and small bowel obstruction. Patient has PMH cholecystectomy, CAD, NSTEMI, HTN, HLD, A. Fib, and recent pacemaker. She has been NPO/clear liquids since admission. Patient reports Hives allergy to Shellfish. Allergy was confirmed with family to be to ALL seafood.   Glucose / Insulin: BG 96-115 (0 units of SSI used in the last 24 hours) Electrolytes: WNL Renal: Scr <1 Hepatic: LFTs WNL Intake / Output; MIVF: net +4.5L GI Imaging:  10/8 CT/ABDOMEN PELVIS Complex small bowel obstruction with 2 points of obstruction  GI Surgeries / Procedures: S/P exploratory laparotomy, small bowel resection, reduction of internal hernia, and repair or ventral hernia on 10/9  Central access: PICC order Placed 10/10 AM TPN start date: 08/04/2024  Nutritional Goals: Goal TPN rate is 70 mL/hr (provides 84g of protein and 1696 kcals per day)  RD Assessment: Estimated Needs Total Energy Estimated Needs: 1600-1800kcal/day Total Protein Estimated Needs: 80-90g/day Total Fluid Estimated Needs: 1.4-1.6L/day  Current Nutrition:  NPO,   Plan:  Continue TPN to goal rate of 70 mL/hr at 1800.  Electrolytes in TPN: Na 79mEq/L, K 67mEq/L, Ca 13mEq/L, Mg 62mEq/L, and Phos 15mmol/L. Cl:Ac 1:1 Due to allergy to ALL Seafoods- INTRALIPIDS used instead of SMOFLIPIDS Add standard MVI and trace elements to TPN Thiamine 100mg  IV x 7 days ordered outside of bag.  Continue Sensitive q6h SSI and adjust as needed  Monitor TPN labs on Mon/Thurs. BMP, Phos, and Mag monitored daily until stable.   Shuntell Foody A Mychal Durio, PharmD Clinical Pharmacist 08/09/2024 12:48 PM

## 2024-08-09 NOTE — Plan of Care (Signed)

## 2024-08-09 NOTE — Progress Notes (Signed)
 Progress Note    Maria Trujillo  FMW:[id number] DOB: Mar 04, 1934  DOA: 07/30/2024 PCP: Jeffie Cheryl BRAVO, MD      Brief Narrative:    Medical records reviewed and are as summarized below:  Maria Trujillo is a 89 y.o. female with medical history significant for CAD with prior NSTEMI, HTN, HLD, persistent A-fib on Eliquis  and amiodarone , complicated by post conversion pauses/bradycardia with syncope 04/2024 s/p pacemaker on 07/24/2024 -(Eliquis  held 9/26 to 9/30).  She presented to the hospital because of vomiting and abdominal pain.  She was admitted to the hospital for small bowel obstruction.  NG tube was placed and general surgery was consulted.  NG tube was placed for gastric decompression.  NG tube was subsequently removed and patient was started on clear liquid diet.  However, she failed Gastrografin challenge.  She was taken to the OR for exploratory laparotomy with reduction of internal hernia and resection of small bowel with primary anastomosis and lysis of adhesion on 08/03/2024.   Assessment/Plan:   Principal Problem:   SBO (small bowel obstruction) (HCC) Active Problems:   Coronary artery disease with history of NSTEMI   Status cardiac pacemaker 07/24/2024 for sinus pause with syncope   Paroxysmal atrial fibrillation (HCC)   Chronic anticoagulation   HTN (hypertension)   Acquired hypothyroidism   Small bowel obstruction (HCC)   Nutrition Problem: Inadequate oral intake Etiology: altered GI function  Signs/Symptoms: NPO status   Body mass index is 27.4 kg/m.   Small bowel obstruction: Initially failed conservative management.  S/p ex lap, reduction of internal hernia, small bowel resection with primary anastomosis and repair of ventral hernia on 08/03/2024. NG tube was removed on 08/07/2024. She is on full liquid diet.  She is also on TPN for nutritional support.   Maroon-colored stools, concern for acute GI bleed: Appears to have resolved.  Eliquis  has been  held.  IV Protonix .  Acute blood loss anemia: Hemoglobin down from 10.1-8.9-7.8.   Given underlying CAD, may be reasonable to keep hemoglobin at 8 or more.  Discussed risks, benefits and alternatives to blood transfusion with patient and her daughter at the bedside.  They are both agreeable to blood transfusion.  She be transfused with 1 unit of PRBCs.  Repeat hemoglobin tomorrow.    Paroxysmal atrial fibrillation with RVR: Continue IV amiodarone  drip.  Plan to switch to oral amiodarone  home if oral intake stabilizes. Metoprolol  was held because of hypotension.  Follow-up with cardiologist.   Eliquis  on hold.   CAD with previous NSTEMI, s/p cardiac pacemaker on 07/24/2024 for sinus pause with syncope   Mild hyponatremia: Improved   Comorbidities include hypertension, hypothyroidism     Diet Order             Diet full liquid Room service appropriate? Yes; Fluid consistency: Thin  Diet effective 1400                                  Consultants: Cardiologist General Surgeon  Procedures:  S/p ex lap, reduction of internal hernia, small bowel resection with primary anastomosis and repair of ventral hernia on 08/03/2024. PICC line placement in the right brachial vein on 08/04/2024 by IR    Medications:    acetaminophen   1,000 mg Oral Q6H   Chlorhexidine  Gluconate Cloth  6 each Topical Q0600   feeding supplement  237 mL Oral TID BM   insulin aspart  0-9 Units Subcutaneous TID WC   latanoprost   1 drop Right Eye QHS   levothyroxine   100 mcg Oral QAC breakfast   [START ON 08/10/2024] metoprolol  succinate  12.5 mg Oral Daily   pantoprazole  (PROTONIX ) IV  40 mg Intravenous Q24H   timolol   1 drop Right Eye Daily   Continuous Infusions:  amiodarone  30 mg/hr (08/09/24 1403)   promethazine  (PHENERGAN ) injection (IM or IVPB) 150 mL/hr at 08/09/24 1403   TPN ADULT (ION) 70 mL/hr at 08/09/24 1403   TPN ADULT (ION)       Anti-infectives (From admission,  onward)    Start     Dose/Rate Route Frequency Ordered Stop   08/03/24 2000  ciprofloxacin (CIPRO) IVPB 400 mg        400 mg 200 mL/hr over 60 Minutes Intravenous Every 12 hours 08/03/24 1059 08/04/24 1100   08/03/24 2000  metroNIDAZOLE (FLAGYL) IVPB 500 mg        500 mg 100 mL/hr over 60 Minutes Intravenous Every 12 hours 08/03/24 1059 08/04/24 1240   08/03/24 0700  ciprofloxacin (CIPRO) IVPB 400 mg        400 mg 200 mL/hr over 60 Minutes Intravenous  Once 08/02/24 1713 08/03/24 0734   08/03/24 0700  metroNIDAZOLE (FLAGYL) IVPB 500 mg        500 mg 100 mL/hr over 60 Minutes Intravenous  Once 08/02/24 1713 08/03/24 0734   07/30/24 2000  levofloxacin (LEVAQUIN) tablet 250 mg  Status:  Discontinued        250 mg Oral  Once 07/30/24 1958 07/30/24 1959   07/30/24 2000  levofloxacin (LEVAQUIN) tablet 750 mg        750 mg Oral  Once 07/30/24 1959 07/30/24 2021              Family Communication/Anticipated D/C date and plan/Code Status   DVT prophylaxis:      Code Status: Full Code  Family Communication: Plan discussed with Tracey, daughter, at the bedside Disposition Plan: Plan to discharge home versus SNF   Status is: Inpatient Remains inpatient appropriate because: Ex lap for small bowel obstruction       Subjective:   Interval events noted.  No vomiting no diarrhea today.  No black stools.  No abdominal pain.  She feels better today.  Dwayne, daughter, was at the bedside  Objective:    Vitals:   08/09/24 0749 08/09/24 1155 08/09/24 1350 08/09/24 1414  BP: (!) 91/57 (!) 91/47 (!) 100/51 106/63  Pulse: 82 76 78 92  Resp: 16 17 20 18   Temp: (!) 97.5 F (36.4 C) (!) 97.5 F (36.4 C) 97.8 F (36.6 C) 97.9 F (36.6 C)  TempSrc:   Axillary   SpO2: 98% 100% 98%   Weight:      Height:       No data found.   Intake/Output Summary (Last 24 hours) at 08/09/2024 1458 Last data filed at 08/09/2024 1403 Gross per 24 hour  Intake 3777.83 ml  Output 300 ml   Net 3477.83 ml   Filed Weights   08/07/24 0500 08/08/24 0500 08/09/24 0454  Weight: 72.3 kg 71.3 kg 72.4 kg    Exam:  GEN: NAD SKIN: Warm and dry EYES: Pale but anicteric ENT: MMM CV: RRR PULM: CTA B ABD: soft, ND, NT, +BS, surgical incision is clean, dry and intact CNS: AAO x 3, non focal EXT: No edema or tenderness    Data Reviewed:   I have personally reviewed following labs  and imaging studies:  Labs: Labs show the following:   Basic Metabolic Panel: Recent Labs  Lab 08/04/24 0504 08/05/24 0351 08/06/24 0134 08/06/24 0137 08/07/24 0519 08/08/24 0441 08/09/24 0456  NA 132* 132* 134*  --  139 137 136  K 3.8 4.0 4.1  --  4.1 4.3 4.5  CL 103 102 104  --  107 109 109  CO2 22 19* 21*  --  23 21* 21*  GLUCOSE 112* 119* 105*  --  117* 131* 106*  BUN 32* 31* 27*  --  30* 34* 23  CREATININE 0.83 0.78 0.62  --  0.64 0.59 0.67  CALCIUM  8.7* 8.6* 8.6*  --  8.7* 9.3 8.6*  MG 1.8 2.2  --  2.3 2.2  --  1.9  PHOS 3.0 2.2* 2.9  --  2.6  --  3.0   GFR Estimated Creatinine Clearance: 47.4 mL/min (by C-G formula based on SCr of 0.67 mg/dL). Liver Function Tests: Recent Labs  Lab 08/06/24 0134 08/07/24 0519 08/09/24 0456  AST  --  28 53*  ALT  --  28 79*  ALKPHOS  --  55 59  BILITOT  --  0.5 0.3  PROT  --  4.8* 4.6*  ALBUMIN 2.5* 2.1* 2.0*   No results for input(s): LIPASE, AMYLASE in the last 168 hours.  No results for input(s): AMMONIA in the last 168 hours. Coagulation profile Recent Labs  Lab 08/04/24 1328  INR 1.4*    CBC: Recent Labs  Lab 08/05/24 0351 08/06/24 0137 08/07/24 0519 08/08/24 0547 08/08/24 1037 08/09/24 0456  WBC 14.7* 12.7* 9.0 12.8*  --  8.8  HGB 11.9* 11.1* 9.5* 10.1* 8.9* 7.8*  HCT 35.2* 32.2* 28.2* 30.6* 27.3* 23.7*  MCV 90.0 89.2 91.3 94.2  --  93.7  PLT 137* 134* 153 197  --  188   Cardiac Enzymes: No results for input(s): CKTOTAL, CKMB, CKMBINDEX, TROPONINI in the last 168 hours. BNP (last 3  results) No results for input(s): PROBNP in the last 8760 hours. CBG: Recent Labs  Lab 08/08/24 1209 08/08/24 1648 08/09/24 0022 08/09/24 0445 08/09/24 1156  GLUCAP 125* 110* 113* 115* 96   D-Dimer: No results for input(s): DDIMER in the last 72 hours. Hgb A1c: No results for input(s): HGBA1C in the last 72 hours.  Lipid Profile: Recent Labs    08/07/24 0519  TRIG 143   Thyroid  function studies: No results for input(s): TSH, T4TOTAL, T3FREE, THYROIDAB in the last 72 hours.  Invalid input(s): FREET3 Anemia work up: Recent Labs    08/09/24 0456  FERRITIN 215  TIBC 216*  IRON 22*   Sepsis Labs: Recent Labs  Lab 08/06/24 0137 08/07/24 0519 08/08/24 0547 08/09/24 0456  WBC 12.7* 9.0 12.8* 8.8    Microbiology Recent Results (from the past 240 hours)  Resp panel by RT-PCR (RSV, Flu A&B, Covid) Anterior Nasal Swab     Status: None   Collection Time: 07/30/24  7:08 PM   Specimen: Anterior Nasal Swab  Result Value Ref Range Status   SARS Coronavirus 2 by RT PCR NEGATIVE NEGATIVE Final    Comment: (NOTE) SARS-CoV-2 target nucleic acids are NOT DETECTED.  The SARS-CoV-2 RNA is generally detectable in upper respiratory specimens during the acute phase of infection. The lowest concentration of SARS-CoV-2 viral copies this assay can detect is 138 copies/mL. A negative result does not preclude SARS-Cov-2 infection and should not be used as the sole basis for treatment or other patient management decisions. A  negative result may occur with  improper specimen collection/handling, submission of specimen other than nasopharyngeal swab, presence of viral mutation(s) within the areas targeted by this assay, and inadequate number of viral copies(<138 copies/mL). A negative result must be combined with clinical observations, patient history, and epidemiological information. The expected result is Negative.  Fact Sheet for Patients:   BloggerCourse.com  Fact Sheet for Healthcare Providers:  SeriousBroker.it  This test is no t yet approved or cleared by the [place]  FDA and  has been authorized for detection and/or diagnosis of SARS-CoV-2 by FDA under an Emergency Use Authorization (EUA). This EUA will remain  in effect (meaning this test can be used) for the duration of the COVID-19 declaration under Section 564(b)(1) of the Act, 21 U.S.C.section 360bbb-3(b)(1), unless the authorization is terminated  or revoked sooner.       Influenza A by PCR NEGATIVE NEGATIVE Final   Influenza B by PCR NEGATIVE NEGATIVE Final    Comment: (NOTE) The Xpert Xpress SARS-CoV-2/FLU/RSV plus assay is intended as an aid in the diagnosis of influenza from Nasopharyngeal swab specimens and should not be used as a sole basis for treatment. Nasal washings and aspirates are unacceptable for Xpert Xpress SARS-CoV-2/FLU/RSV testing.  Fact Sheet for Patients: BloggerCourse.com  Fact Sheet for Healthcare Providers: SeriousBroker.it  This test is not yet approved or cleared by the [place]  FDA and has been authorized for detection and/or diagnosis of SARS-CoV-2 by FDA under an Emergency Use Authorization (EUA). This EUA will remain in effect (meaning this test can be used) for the duration of the COVID-19 declaration under Section 564(b)(1) of the Act, 21 U.S.C. section 360bbb-3(b)(1), unless the authorization is terminated or revoked.     Resp Syncytial Virus by PCR NEGATIVE NEGATIVE Final    Comment: (NOTE) Fact Sheet for Patients: BloggerCourse.com  Fact Sheet for Healthcare Providers: SeriousBroker.it  This test is not yet approved or cleared by the [place]  FDA and has been authorized for detection and/or diagnosis of SARS-CoV-2 by FDA under an Emergency Use  Authorization (EUA). This EUA will remain in effect (meaning this test can be used) for the duration of the COVID-19 declaration under Section 564(b)(1) of the Act, 21 U.S.C. section 360bbb-3(b)(1), unless the authorization is terminated or revoked.  Performed at [facility], [address] [place], [place] [address]     Procedures and diagnostic studies:  No results found.              LOS: 9 days   Donterrius Santucci  [employer] loss adjuster on [email or web address]. If 7PM-7AM, please contact night-coverage at [email or web address]     08/09/2024, 2:58 PM

## 2024-08-09 NOTE — Progress Notes (Addendum)
 [facility] CLINIC CARDIOLOGY PROGRESS NOTE       Patient ID: Maria Trujillo MRN: [id number] DOB/AGE: 88-Oct-1935 88 y.o.  Admit date: 07/30/2024 Referring Physician Dr. Amaryllis Dare Primary Physician Feldpausch, Cheryl BRAVO, MD  Primary Cardiologist Dr. Wilburn Reason for Consultation AF RVR  HPI: Maria Trujillo is a 89 y.o. female  with a past medical history of  persistent atrial fibrillation (DCCV 02/2023, on Tikosyn ), hypertension, hyperlipidemia, history bradycardia, history NSTEMI (10/2020), coronary artery disease s/p stent to RCA (10/2020) who presented to the ED on 07/30/2024 for intractable vomiting and abdominal pain. Admitted for small bowel obstruction/ileus. 08/04/2024 HR elevated, AF RVR. Cardiology was consulted for further evaluation.   Interval history:  - Patient seen and examined this AM, resting in bed with daughter at bedside. - Feeling better overall today compared to yesterday.  Denies any recurrence of bloody bowel movements and nausea and vomiting have improved. - Rate improved this morning in the ED timely.  BP borderline low.  Review of systems complete and found to be negative unless listed above    Past Medical History:  Diagnosis Date   Arthritis    Atrial fibrillation (HCC)    History of kidney stones    Hypothyroidism    Myocardial infarct (HCC)    PONV (postoperative nausea and vomiting)     Past Surgical History:  Procedure Laterality Date   BOWEL RESECTION N/A 08/03/2024   Procedure: EXCISION, SMALL INTESTINE;  Surgeon: Jordis Laneta FALCON, MD;  Location: [facility] ORS;  Service: General;  Laterality: N/A;   BREAST EXCISIONAL BIOPSY Bilateral 1984   benign   CARDIOVERSION N/A 03/04/2023   Procedure: CARDIOVERSION;  Surgeon: Ammon Blunt, MD;  Location: [facility] ORS;  Service: Cardiovascular;  Laterality: N/A;   CATARACT EXTRACTION, BILATERAL     CHOLECYSTECTOMY     EYE SURGERY     JOINT REPLACEMENT Left 12/2012   knee   KNEE ARTHROPLASTY Right 02/15/2017    Procedure: COMPUTER ASSISTED TOTAL KNEE ARTHROPLASTY;  Surgeon: Lynwood SHAUNNA Hue, MD;  Location: [facility] ORS;  Service: Orthopedics;  Laterality: Right;   LAPAROTOMY N/A 08/03/2024   Procedure: LAPAROTOMY, EXPLORATORY;  Surgeon: Jordis Laneta FALCON, MD;  Location: [facility];  Service: General;  Laterality: N/A;   LEFT HEART CATH AND CORONARY ANGIOGRAPHY N/A 11/04/2020   Procedure: LEFT HEART CATH AND CORONARY ANGIOGRAPHY;  Surgeon: Ammon Blunt, MD;  Location: [facility] INVASIVE CV LAB;  Service: Cardiovascular;  Laterality: N/A;   PACEMAKER IMPLANT N/A 07/24/2024   Procedure: PACEMAKER IMPLANT;  Surgeon: Cindie Ole DASEN, MD;  Location[facility] INVASIVE CV LAB;  Service: Cardiovascular;  Laterality: N/A;   PHOTOCOAGULATION WITH LASER Right 04/20/2018   Procedure: TRANSCLERAL DIODE CYCLOPHOTOCOAGULATION  RIGHT per Hope block;  Surgeon: Mittie Gaskin, MD;  Location: [facility];  Service: Ophthalmology;  Laterality: Right;   ROTATOR CUFF REPAIR Left 2010   TONSILLECTOMY     VENTRAL HERNIA REPAIR N/A 08/03/2024   Procedure: REPAIR, HERNIA, VENTRAL;  Surgeon: Jordis Laneta FALCON, MD;  Location: [facility] OR[facility];  Service: General;  Laterality: N/A;    Medications Prior to Admission  Medication Sig Dispense Refill Last Dose/Taking   acetaminophen  (TYLENOL ) 650 MG CR tablet Take 650 mg by mouth daily as needed for pain.   Taking As Needed   amiodarone  (PACERONE ) 200 MG tablet Take 2 tablets (400 mg total) by mouth 2 (two) times daily for 10 days, THEN 1 tablet (200 mg total) daily. (Patient taking differently:  (200 mg total) daily.) 70 tablet 0  07/30/2024 Morning   apixaban  (ELIQUIS ) 5 MG TABS tablet Take 1 tablet (5 mg total) by mouth 2 (two) times daily. 180 tablet 3 07/30/2024 Morning   atorvastatin  (LIPITOR ) 40 MG tablet Take 40 mg by mouth at bedtime.   07/29/2024 Bedtime   azelastine  (ASTELIN ) 0.1 % nasal spray Place 1 spray into both nostrils daily.   07/30/2024 Morning   bimatoprost (LUMIGAN) 0.03 %  ophthalmic solution Place 1 drop into the right eye at bedtime.   07/29/2024 Bedtime   Calcium  Carb-Cholecalciferol  (CALCIUM  CARBONATE-VITAMIN D3) 600-400 MG-UNIT TABS Take 1 tablet by mouth daily.   07/30/2024 Morning   Carboxymethylcellulose Sodium (THERATEARS) 0.25 % SOLN Place 1 drop into both eyes daily as needed (Dry eyes).   Taking As Needed   Cholecalciferol  (VITAMIN D ) 50 MCG (2000 UT) tablet Take 2,000 Units by mouth daily.   07/30/2024 Morning   Docusate Sodium  100 MG capsule Take 100 mg by mouth daily as needed for mild constipation or moderate constipation.   Taking As Needed   fexofenadine (ALLEGRA) 180 MG tablet Take 180 mg by mouth at bedtime.   07/29/2024 Bedtime   folic acid (FOLVITE) 400 MCG tablet Take 400 mcg by mouth daily.   Taking   ipratropium (ATROVENT) 0.06 % nasal spray Place 1 spray into both nostrils daily as needed for rhinitis.   Taking As Needed   isosorbide  mononitrate (IMDUR ) 30 MG 24 hr tablet Take 1 tablet (30 mg total) by mouth daily. 30 tablet 2 07/30/2024 Morning   levothyroxine  (SYNTHROID , LEVOTHROID) 100 MCG tablet Take 100 mcg by mouth daily before breakfast.   07/30/2024 Morning   lisinopril  (ZESTRIL ) 5 MG tablet Take 1 tablet (5 mg total) by mouth daily. 30 tablet 2 07/30/2024 Morning   LUMIGAN 0.01 % SOLN Place 1 drop into the right eye at bedtime.   Taking   metoprolol  tartrate (LOPRESSOR ) 25 MG tablet Take 0.5 tablets (12.5 mg total) by mouth daily as needed. for heart rate over 100.  Home med. 10 tablet 1 Unknown   Multiple Vitamin (MULTIVITAMIN WITH MINERALS) TABS tablet Take 1 tablet by mouth daily.   07/30/2024 Morning   Multiple Vitamins-Minerals (PRESERVISION AREDS 2 PO) Take 2 tablets by mouth 2 (two) times daily.   Taking   pantoprazole  (PROTONIX ) 40 MG tablet Take 40 mg by mouth daily.   07/30/2024 Morning   prednisoLONE acetate (PRED FORTE) 1 % ophthalmic suspension Place 1 drop into the right eye 4 (four) times daily.   Taking   timolol  (TIMOPTIC )  0.5 % ophthalmic solution Place 1 drop into the right eye in the morning.   07/30/2024 Morning   vitamin B-12 (CYANOCOBALAMIN ) 100 MCG tablet Take 100 mcg by mouth daily.   07/30/2024 Morning   furosemide  (LASIX ) 20 MG tablet Take 20 mg by mouth daily. prn (Patient not taking: Reported on 07/21/2024)      Social History   Socioeconomic History   Marital status: Widowed    Spouse name: Not on file   Number of children: Not on file   Years of education: Not on file   Highest education level: Not on file  Occupational History   Not on file  Tobacco Use   Smoking status: Never   Smokeless tobacco: Never  Vaping Use   Vaping status: Never Used  Substance and Sexual Activity   Alcohol  use: No   Drug use: No   Sexual activity: Not on file  Other Topics Concern   Not on file  Social History Narrative   Not on file   Social Drivers of Health   Financial Resource Strain: Low Risk  (01/20/2024)   Received from [facility]   Overall Financial Resource Strain (CARDIA)    Difficulty of Paying Living Expenses: Not hard at all  Food Insecurity: No Food Insecurity (07/30/2024)   Hunger Vital Sign    Worried About Running Out of Food in the Last Year: Never true    Ran Out of Food in the Last Year: Never true  Transportation Needs: No Transportation Needs (07/30/2024)   PRAPARE - Administrator, Civil Service (Medical): No    Lack of Transportation (Non-Medical): No  Physical Activity: Inactive (11/03/2018)   Received from [facility]   Exercise Vital Sign    Days of Exercise per Week: 0 days    Minutes of Exercise per Session: 0 min  Stress: No Stress Concern Present (11/03/2018)   Received from [facility] - Occupational Stress Questionnaire    Feeling of Stress : Not at all  Social Connections: Moderately Isolated (07/30/2024)   Social Connection and Isolation Panel    Frequency of Communication with Friends  and Family: Three times a week    Frequency of Social Gatherings with Friends and Family: Once a week    Attends Religious Services: More than 4 times per year    Active Member of [employer] or Organizations: No    Attends Banker Meetings: Never    Marital Status: Widowed  Intimate Partner Violence: Not At Risk (07/30/2024)   Humiliation, Afraid, Rape, and Kick questionnaire    Fear of Current or Ex-Partner: No    Emotionally Abused: No    Physically Abused: No    Sexually Abused: No    Family History  Problem Relation Age of Onset   Breast cancer Sister 62     Vitals:   08/09/24 0037 08/09/24 0357 08/09/24 0454 08/09/24 0749  BP: 110/60 (!) 99/54  (!) 91/57  Pulse: 80 98  82  Resp:  20  16  Temp:  97.7 F (36.5 C)  (!) 97.5 F (36.4 C)  TempSrc:  Oral    SpO2:  98%  98%  Weight:   72.4 kg   Height:        PHYSICAL EXAM General: Ill-appearing elderly female, well nourished, in no acute distress. HEENT: Normocephalic and atraumatic. NGT in place. Neck: No JVD.  Lungs: Normal respiratory effort on room air. Clear bilaterally to auscultation. No wheezes, crackles, rhonchi.  Heart: Irregularly irregular, controlled rate. Normal S1 and S2 without gallops or murmurs.  Abdomen: Non-distended appearing.  Msk: Normal strength and tone for age. Extremities: Warm and well perfused. No clubbing, cyanosis.  No edema.  Neuro: Alert and oriented X 3. Psych: Answers questions appropriately.   Labs: Basic Metabolic Panel: Recent Labs    08/07/24 0519 08/08/24 0441 08/09/24 0456  NA 139 137 136  K 4.1 4.3 4.5  CL 107 109 109  CO2 23 21* 21*  GLUCOSE 117* 131* 106*  BUN 30* 34* 23  CREATININE 0.64 0.59 0.67  CALCIUM  8.7* 9.3 8.6*  MG 2.2  --  1.9  PHOS 2.6  --  3.0   Liver Function Tests: Recent Labs    08/07/24 0519 08/09/24 0456  AST 28 53*  ALT 28 79*  ALKPHOS 55 59  BILITOT 0.5 0.3  PROT 4.8* 4.6*  ALBUMIN 2.1* 2.0*  No results for input(s):  LIPASE, AMYLASE in the last 72 hours. CBC: Recent Labs    08/08/24 0547 08/08/24 1037 08/09/24 0456  WBC 12.8*  --  8.8  HGB 10.1* 8.9* 7.8*  HCT 30.6* 27.3* 23.7*  MCV 94.2  --  93.7  PLT 197  --  188   Cardiac Enzymes: No results for input(s): CKTOTAL, CKMB, CKMBINDEX, TROPONINIHS in the last 72 hours. BNP: No results for input(s): BNP in the last 72 hours. D-Dimer: No results for input(s): DDIMER in the last 72 hours. Hemoglobin A1C: No results for input(s): HGBA1C in the last 72 hours.  Fasting Lipid Panel: Recent Labs    08/07/24 0519  TRIG 143   Thyroid  Function Tests: No results for input(s): TSH, T4TOTAL, T3FREE, THYROIDAB in the last 72 hours.  Invalid input(s): FREET3 Anemia Panel: Recent Labs    08/09/24 0456  FERRITIN 215  TIBC 216*  IRON 22*     Radiology: IR PICC PLACEMENT RIGHT >5 YRS INC IMG GUIDE Result Date: 08/04/2024 INDICATION: 89 year old female with recently placed left subclavian approach cardiac rhythm maintenance device. Unfortunately, she requires TPN and therefore IR is consulted for PICC placement. EXAM: PICC LINE PLACEMENT WITH ULTRASOUND AND FLUOROSCOPIC GUIDANCE MEDICATIONS: None. ANESTHESIA/SEDATION: None. FLUOROSCOPY TIME:  Radiation exposure index: 2.9 mGy, air kerma COMPLICATIONS: SIR Level A - No therapy, no consequence. Inadvertent arterial puncture by the APP initially performing the procedure. The complication was recognized immediately and pressure held for 25 minutes. No evidence of hematoma. Physician then took over and completed placement of PICC. PROCEDURE: The patient was advised of the possible risks and complications and agreed to undergo the procedure. The patient was then brought to the angiographic suite for the procedure. The right arm was prepped with chlorhexidine , draped in the usual sterile fashion using maximum barrier technique (cap and mask, sterile gown, sterile gloves, large sterile  sheet, hand hygiene and cutaneous antisepsis) and infiltrated locally with 1% Lidocaine . Ultrasound demonstrated patency of the right brachial vein, and this was documented with an image. Under real-time ultrasound guidance, this vein was accessed with a 21 gauge micropuncture needle and image documentation was performed. A 0.018 wire was introduced in to the vein. Over this, a 5 [place] dual lumen power-injectable PICC was advanced to the lower SVC. Fluoroscopy during the procedure and fluoro spot radiograph confirms appropriate catheter position. The catheter was flushed and covered with a sterile dressing. Catheter length: 32 cm IMPRESSION: Successful right arm Power PICC line placement with ultrasound and fluoroscopic guidance. The catheter is ready for use. Electronically Signed   By: Wilkie Lent M.D.   On: 08/04/2024 16:44   US  EKG SITE RITE Result Date: 08/03/2024 If Site Rite image not attached, placement could not be confirmed due to current cardiac rhythm.  DG Abd 1 View Result Date: 08/02/2024 CLINICAL DATA:  Nasogastric tube placement EXAM: ABDOMEN - 1 VIEW COMPARISON:  None Available. FINDINGS: No nasogastric tube is identified within visualized lung bases and upper abdomen. Dilated fluid-filled loops of bowel noted on prior CT examination is not well appreciated on this exam. No free intraperitoneal gas. Pacemaker in place. IMPRESSION: 1. No nasogastric tube identified. Correlate for malposition within the oropharynx is recommended. Electronically Signed   By: Dorethia Molt M.D.   On: 08/02/2024 23:33   CT ABDOMEN PELVIS W CONTRAST Result Date: 08/02/2024 EXAM: CT ABDOMEN AND PELVIS WITH CONTRAST 08/02/2024 07:14:10 PM TECHNIQUE: CT of the abdomen and pelvis was performed with the administration of intravenous  contrast. Multiplanar reformatted images are provided for review. Automated exposure control, iterative reconstruction, and/or weight-based adjustment of the mA/kV was utilized to  reduce the radiation dose to as low as reasonably achievable. COMPARISON: None available. CLINICAL HISTORY: Abdominal pain, acute, nonlocalized. 89 y.o. female with medical history significant for CAD with prior NSTEMI, HTN, HLD, persistent A-fib on Eliquis  and amiodarone , complicated by post conversion pauses/bradycardia with syncope 04/2024 s/p pacemaker on 07/24/2024 -(Eliquis  held 9/26 to 9/30), being admitted for small bowel obstruction. She presented with intractable nonbloody, nonbilious, non-coffee-ground vomiting starting on the night prior to arrival associated with lower abdominal pain. FINDINGS: LOWER CHEST: Pacemaker leads within the right heart. Cardiac size within normal limits. Moderate coronary artery calcification. LIVER: The liver is unremarkable. GALLBLADDER AND BILE DUCTS: Status post cholecystectomy. SPLEEN: No acute abnormality. PANCREAS: No acute abnormality. ADRENAL GLANDS: No acute abnormality. KIDNEYS, URETERS AND BLADDER: No stones in the kidneys or ureters. No hydronephrosis. No perinephric or periureteral stranding. Urinary bladder is unremarkable. GI AND BOWEL: There was a complex small bowel obstruction with 2 points of transitioning close proximity seen within the right hemipelvis on axial image 63, series 2 and coronal image 45 series 5. The proximal small bowel is dilated and fluid-filled. However, the intervening small bowel within the pelvis appears not only dilated and fluid-filled but demonstrates extensive mesenteric edema in keeping with a closed loop obstruction of this segment. The terminal small bowel is decompressed. The colon is unremarkable. The stomach is unremarkable. PERITONEUM AND RETROPERITONEUM: Trace free fluid within the pelvis. No free intraperitoneal gas. No ascites. VASCULATURE: Moderate aortoiliac atherosclerotic calcification. No aortic aneurysm. Aorta is normal in caliber. LYMPH NODES: No lymphadenopathy. REPRODUCTIVE ORGANS: Uterus absent. No adnexal  masses. BONES AND SOFT TISSUES: Osseous structures are age appropriate. No acute bone abnormality. No lytic or blastic bone lesion. IMPRESSION: 1. Complex small bowel obstruction with 2 points of obstruction in close proximity likely related to an underlying adhesion within the right hemipelvis. Resultant closed loop obstruction of the intervening small bowel in the right hemipelvis, evidenced by dilated, fluid-filled intervening small bowel with extensive mesenteric edema. No free intraperitoneal gas. 2. Trace free fluid within the pelvis. 3. These results will be called to the ordering clinician or representative by the radiologist assistant, and communication documented in the pacs or clario dashboard. Electronically signed by: Dorethia Molt MD 08/02/2024 07:40 PM EDT RP Workstation: [id number]   DG ABD ACUTE 2+V W 1V CHEST Result Date: 08/02/2024 CLINICAL DATA:  Left hand small bowel obstruction EXAM: DG ABDOMEN ACUTE WITH 1 VIEW CHEST COMPARISON:  August 01, 2024 FINDINGS: Interval removal of enteric tube. Similar appearance of dilated loops of small bowel in the upper abdomen with persistent air-fluid levels again identified. No free air. Cholecystectomy clips. Unchanged cardiac pacemaker.  No acute osseous findings. IMPRESSION: Interval removal of enteric tube. Similar appearance of dilated loop of small bowel in the upper abdomen with persistent air-fluid levels. Electronically Signed   By: Michaeline Blanch M.D.   On: 08/02/2024 11:23   DG ABD ACUTE 2+V W 1V CHEST Result Date: 08/01/2024 CLINICAL DATA:  Small bowel obstruction EXAM: DG ABDOMEN ACUTE WITH 1 VIEW CHEST COMPARISON:  July 30, 2024 FINDINGS: Unchanged enteric tube with tip in side hole projecting over the stomach. Scattered air-fluid levels with a few dilated loops of small bowel again identified in the upper abdomen, although there is paucity of visualized bowel gas limiting evaluation somewhat. No free air. No focal consolidations. No  pleural effusions or  pneumothorax. Left chest wall pacemaker. IMPRESSION: Enteric tube in place. Paucity of visualized bowel gas, limiting evaluation, although there appears to be persistent dilated loop of bowel in the upper abdomen with scattered air-fluid levels again identified. Electronically Signed   By: Michaeline Blanch M.D.   On: 08/01/2024 10:57   DG Abd 1 View Result Date: 07/30/2024 CLINICAL DATA:  Nasogastric tube placement. EXAM: ABDOMEN - 1 VIEW COMPARISON:  CT earlier today FINDINGS: Tip and side port of the enteric tube below the diaphragm in the stomach. Excreted IV contrast in the renal collecting systems from prior CT. Dilated small bowel in CT is fluid-filled and not well demonstrated by radiograph. IMPRESSION: Tip and side port of the enteric tube below the diaphragm in the stomach. Electronically Signed   By: Andrea Gasman M.D.   On: 07/30/2024 22:48   CT ABDOMEN PELVIS W CONTRAST Result Date: 07/30/2024 CLINICAL DATA:  Abdominal pain. Intractable vomiting. Recent pacemaker placement. Prior cholecystectomy. EXAM: CT ABDOMEN AND PELVIS WITH CONTRAST TECHNIQUE: Multidetector CT imaging of the abdomen and pelvis was performed using the standard protocol following bolus administration of intravenous contrast. RADIATION DOSE REDUCTION: This exam was performed according to the departmental dose-optimization program which includes automated exposure control, adjustment of the mA and/or kV according to patient size and/or use of iterative reconstruction technique. CONTRAST:  OMNIPAQUE  IOHEXOL  300 MG/ML  SOLN COMPARISON:  Noncontrast CT 12/11/2014 FINDINGS: Lower chest: Pacemaker leads overlie the right atrium and ventricle. No pericardial effusion. No basilar airspace disease or pleural effusion. Coronary artery calcifications. Hepatobiliary: No focal liver abnormality. Cholecystectomy. Mild central intrahepatic biliary ductal dilatation. No common bile duct dilatation. Pancreas: No ductal  dilatation or inflammation. Spleen: Normal in size without focal abnormality. Adrenals/Urinary Tract: No adrenal nodule. No hydronephrosis. No renal calculi. No suspicious renal abnormality. Partially distended urinary bladder, normal for degree of distension. Stomach/Bowel: Dilated fluid-filled stomach. Dilated fluid-filled small bowel. Suspected transition point in the right lower quadrant, series 2, image 61, the distal small bowel is decompressed. There is mesenteric edema and free fluid. No bowel pneumatosis. Small to moderate volume of stool in the colon. No colonic inflammation. Normal appendix is potentially visualized. Vascular/Lymphatic: Aortic atherosclerosis and tortuosity. No aortic aneurysm. Patent portal vein. No portal venous or mesenteric gas. No suspicious lymphadenopathy. Reproductive: Normal for age. Other: Generalized mesenteric edema. Free fluid in the mesentery, abdomen and pelvis. No free air or focal fluid collection. Musculoskeletal: Multilevel degenerative change in the spine. There are no acute or suspicious osseous abnormalities. IMPRESSION: 1. Small bowel obstruction with suspected transition point in the right lower quadrant. Mesenteric edema and free fluid. 2. Aortic Atherosclerosis (ICD10-I70.0). Electronically Signed   By: Andrea Gasman M.D.   On: 07/30/2024 20:27   DG Chest 2 View Result Date: 07/24/2024 CLINICAL DATA:  Pacemaker. EXAM: CHEST - 2 VIEW COMPARISON:  Radiograph 07/05/2024 FINDINGS: Dual lead left-sided pacemaker with lead tips projecting over the right atrium and ventricle.The cardiomediastinal contours are normal. Aortic atherosclerosis. Unchanged elevation of right hemidiaphragm. Pulmonary vasculature is normal. No consolidation, pleural effusion, or pneumothorax. Chronic distal left clavicle deformity. No acute osseous abnormalities are seen. IMPRESSION: Dual lead left-sided pacemaker with lead tips projecting over the right atrium and ventricle. No  pneumothorax. Electronically Signed   By: Andrea Gasman M.D.   On: 07/24/2024 18:38   EP PPM/ICD IMPLANT Result Date: 07/24/2024  CONCLUSIONS:  1. Symptomatic bradycardia due to tachycardia-bradycardia syndrome  2. Successful dual chamber permanent pacemaker implantation  3.  No early  apparent complications.  4. Hold Eliquis  and Aspirin  for 5 days (OK to restart July 30, 2024)    ECHO 05/2024: NORMAL LEFT VENTRICULAR SYSTOLIC FUNCTION WITH MILD LVH  ESTIMATED EF: 55%  NORMAL LA PRESSURES WITH DIASTOLIC DYSFUNCTION (GRADE 1)  NORMAL RIGHT VENTRICULAR SYSTOLIC FUNCTION  VALVULAR REGURGITATION: No AR, MILD MR, No PR, MILD TR  ESTIMATED RVSP: 36 mmHg (Normal)  NO VALVULAR STENOSIS   TELEMETRY reviewed by me 08/09/2024: atrial fibrillation rate 100s  EKG reviewed by me: NSR rate 60 bpm  Data reviewed by me 08/09/2024: last 24h vitals tele labs imaging I/O hospitalist progress note, general surgery notes  Principal Problem:   SBO (small bowel obstruction) (HCC) Active Problems:   HTN (hypertension)   Acquired hypothyroidism   Coronary artery disease with history of NSTEMI   Status cardiac pacemaker 07/24/2024 for sinus pause with syncope   Paroxysmal atrial fibrillation (HCC)   Chronic anticoagulation   Small bowel obstruction (HCC)    ASSESSMENT AND PLAN:  ZYKERA ABELLA is a 89 y.o. female  with a past medical history of  persistent atrial fibrillation (DCCV 02/2023, on Tikosyn ), hypertension, hyperlipidemia, history bradycardia, history NSTEMI (10/2020), coronary artery disease s/p stent to RCA (10/2020) who presented to the ED on 07/30/2024 for intractable vomiting and abdominal pain. Admitted for small bowel obstruction/ileus. 08/04/2024 HR elevated, AF RVR. Cardiology was consulted for further evaluation.   # Small bowel obstruction s/p ex lap and resection 08/03/24 # Atrial fibrillation RVR # Paroxysmal atrial fibrillation # Coronary artery disease # Hypertension Patient  initially presented with nausea/vomiting related to small bowel obstruction.  Underwent exploratory laparotomy yesterday for small bowel resection.  Has a history of atrial fibrillation, underwent cardioversion last year and had been maintaining sinus rhythm on amiodarone  at home.  Developed atrial fibrillation RVR this morning.  Remains NPO. -Continue IV amiodarone  for rhythm control. Transition to PO once able to tolerate, possibly tomorrow. - Continue metoprolol  tartrate 12.5 mg twice daily.  Will closely monitor patient's BP. -Eliquis  currently held given bloody BMs, anemia.  Ideally would like this resumed once anemia improves and has no recurrence of bloody BMs. -As needed IV metoprolol  for elevated heart rate as well as as needed IV hydralazine  for elevated BP.  This patient's plan of care was discussed and created with Dr. Florencio and he is in agreement.  Signed: Danita Bloch, PA-C  08/09/2024, 10:34 AM [facility]

## 2024-08-09 NOTE — Progress Notes (Signed)
 Occupational Therapy Treatment Patient Details Name: Maria Trujillo MRN: [id number] DOB: 1933-11-02 Today's Date: 08/09/2024   History of present illness 89 y.o. female with medical history significant for CAD with prior NSTEMI, HTN, HLD, persistent A-fib on Eliquis  and amiodarone , complicated by post conversion  pauses/bradycardia with syncope 04/2024 s/p pacemaker on 07/24/2024 -(Eliquis  held 9/26 to 9/30), being admitted for small bowel obstruction.   OT comments  Pt seen for OT tx this date, supportive daughter present. BP soft but MAP above 95. Pt with limited activity tolerance, requires up to MIN A for bed mobility with log roll technique for abdominal comfort, sits EOB for grooming tasks for extended period, and requires MIN - MOD A for squat pivot t/f BSC with daughter assisting. Cues for pacemaker precautions LUE. RN in room end of session. OT to follow acutely, discharge recommendation appropriate.       If plan is discharge home, recommend the following:  A lot of help with walking and/or transfers;A lot of help with bathing/dressing/bathroom;Assistance with cooking/housework;Assist for transportation;Help with stairs or ramp for entrance   Equipment Recommendations  Other (comment)       Precautions / Restrictions Precautions Precautions: Fall;ICD/Pacemaker Recall of Precautions/Restrictions: Intact Restrictions Weight Bearing Restrictions Per Provider Order: No Other Position/Activity Restrictions: L UE pacemaker precautions -limit pushing and pulling       Mobility Bed Mobility Overal bed mobility: Needs Assistance Bed Mobility: Sit to Sidelying, Sidelying to Sit, Rolling Rolling: Contact guard assist, Used rails Sidelying to sit: Min assist, HOB elevated     Sit to sidelying: Min assist, HOB elevated, Used rails      Transfers Overall transfer level: Needs assistance Equipment used: 1 person hand held assist Transfers: Sit to/from Stand Sit to Stand: Min  assist, Contact guard assist   Squat pivot transfers: Mod assist, Contact guard assist       General transfer comment: Min A to stand from lowest bed height using HHA x1--no pushing up with LUE during transfer     Balance Overall balance assessment: Needs assistance Sitting-balance support: Feet supported Sitting balance-Leahy Scale: Good Sitting balance - Comments: supervision seated EOB   Standing balance support: Single extremity supported Standing balance-Leahy Scale: Fair                             ADL either performed or assessed with clinical judgement   ADL Overall ADL's : Needs assistance/impaired     Grooming: Wash/dry hands;Wash/dry face;Set up;Sitting Grooming Details (indicate cue type and reason): sitting at EOB                 Toilet Transfer: Minimal assistance;Contact guard assist;BSC/3in1;Squat-pivot Toilet Transfer Details (indicate cue type and reason): cues for sequencing, daugther provides assistance for pericare and transfer Toileting- Clothing Manipulation and Hygiene: Minimal assistance;With caregiver independent assisting;Sit to/from stand       Functional mobility during ADLs: Minimal assistance General ADL Comments: pt with poor activity tolerance, generalized weakness, cues to avoid overuse of LUE     Communication Communication Communication: No apparent difficulties   Cognition Arousal: Alert Behavior During Therapy: WFL for tasks assessed/performed Cognition: No apparent impairments                               Following commands: Intact        Cueing   Cueing Techniques: Verbal cues  General Comments BP low, MAP above 65    Pertinent Vitals/ Pain       Pain Assessment Pain Assessment: No/denies pain   Frequency  Min 2X/week        Progress Toward Goals  OT Goals(current goals can now be found in the care plan section)  Progress towards OT goals: Progressing toward  goals  Acute Rehab OT Goals OT Goal Formulation: With patient/family Time For Goal Achievement: 08/18/24 Potential to Achieve Goals: Fair ADL Goals Pt Will Perform Upper Body Bathing: with supervision;sitting Pt Will Perform Lower Body Dressing: with supervision;sit to/from stand Pt Will Transfer to Toilet: with supervision;ambulating Pt Will Perform Toileting - Clothing Manipulation and hygiene: with supervision;sit to/from stand  Plan         AM-PAC OT 6 Clicks Daily Activity     Outcome Measure   Help from another person eating meals?: None Help from another person taking care of personal grooming?: A Little Help from another person toileting, which includes using toliet, bedpan, or urinal?: A Lot Help from another person bathing (including washing, rinsing, drying)?: A Lot Help from another person to put on and taking off regular upper body clothing?: A Little Help from another person to put on and taking off regular lower body clothing?: A Lot 6 Click Score: 16    End of Session    OT Visit Diagnosis: Unsteadiness on feet (R26.81);Repeated falls (R29.6);Muscle weakness (generalized) (M62.81)   Activity Tolerance Patient tolerated treatment well   Patient Left in bed;with call bell/phone within reach;with family/visitor present   Nurse Communication Mobility status        Time: 8895-8857 OT Time Calculation (min): 38 min  Charges: OT General Charges $OT Visit: 1 Visit OT Treatments $Self Care/Home Management : 23-37 mins  Mars Scheaffer L. Jamilyn Pigeon, OTR/L  08/09/24, 2:59 PM

## 2024-08-10 DIAGNOSIS — K56609 Unspecified intestinal obstruction, unspecified as to partial versus complete obstruction: Secondary | ICD-10-CM | POA: Diagnosis not present

## 2024-08-10 DIAGNOSIS — K921 Melena: Secondary | ICD-10-CM

## 2024-08-10 DIAGNOSIS — I4821 Permanent atrial fibrillation: Secondary | ICD-10-CM | POA: Diagnosis not present

## 2024-08-10 DIAGNOSIS — Z7901 Long term (current) use of anticoagulants: Secondary | ICD-10-CM | POA: Diagnosis not present

## 2024-08-10 DIAGNOSIS — Z950 Presence of cardiac pacemaker: Secondary | ICD-10-CM | POA: Diagnosis not present

## 2024-08-10 LAB — GLUCOSE, CAPILLARY
Glucose-Capillary: 109 mg/dL — ABNORMAL HIGH (ref 70–99)
Glucose-Capillary: 112 mg/dL — ABNORMAL HIGH (ref 70–99)
Glucose-Capillary: 114 mg/dL — ABNORMAL HIGH (ref 70–99)

## 2024-08-10 LAB — COMPREHENSIVE METABOLIC PANEL WITH GFR
ALT: 122 U/L — ABNORMAL HIGH (ref 0–44)
AST: 71 U/L — ABNORMAL HIGH (ref 15–41)
Albumin: 2.5 g/dL — ABNORMAL LOW (ref 3.5–5.0)
Alkaline Phosphatase: 89 U/L (ref 38–126)
Anion gap: 10 (ref 5–15)
BUN: 20 mg/dL (ref 8–23)
CO2: 22 mmol/L (ref 22–32)
Calcium: 9 mg/dL (ref 8.9–10.3)
Chloride: 105 mmol/L (ref 98–111)
Creatinine, Ser: 0.57 mg/dL (ref 0.44–1.00)
GFR, Estimated: >60 mL/min (ref >60)
Glucose, Bld: 106 mg/dL — ABNORMAL HIGH (ref 70–99)
Potassium: 4.6 mmol/L (ref 3.5–5.1)
Sodium: 137 mmol/L (ref 135–145)
Total Bilirubin: 0.4 mg/dL (ref 0.0–1.2)
Total Protein: 5.4 g/dL — ABNORMAL LOW (ref 6.5–8.1)

## 2024-08-10 LAB — BPAM RBC
Blood Product Expiration Date: 202511172359
ISSUE DATE / TIME: 202510151352
Unit Type and Rh: 5100

## 2024-08-10 LAB — TYPE AND SCREEN
ABO/RH(D): O POS
Antibody Screen: NEGATIVE
Unit division: 0

## 2024-08-10 LAB — CBC
HCT: 30 % — ABNORMAL LOW (ref 36.0–46.0)
Hemoglobin: 9.9 g/dL — ABNORMAL LOW (ref 12.0–15.0)
MCH: 30.6 pg (ref 26.0–34.0)
MCHC: 33 g/dL (ref 30.0–36.0)
MCV: 92.6 fL (ref 80.0–100.0)
Platelets: 247 K/uL (ref 150–400)
RBC: 3.24 MIL/uL — ABNORMAL LOW (ref 3.87–5.11)
RDW: 14.3 % (ref 11.5–15.5)
WBC: 13.5 K/uL — ABNORMAL HIGH (ref 4.0–10.5)
nRBC: 0 % (ref 0.0–0.2)

## 2024-08-10 LAB — PHOSPHORUS: Phosphorus: 2.9 mg/dL (ref 2.5–4.6)

## 2024-08-10 LAB — MAGNESIUM: Magnesium: 2.3 mg/dL (ref 1.7–2.4)

## 2024-08-10 MED ORDER — TRAVASOL 10 % IV SOLN
INTRAVENOUS | Status: AC
  Filled 2024-08-10: qty 420

## 2024-08-10 MED ORDER — AMIODARONE HCL 200 MG PO TABS
200.0000 mg | ORAL_TABLET | Freq: Every day | ORAL | Status: AC
Start: 2024-08-17 — End: ?

## 2024-08-10 MED ORDER — AMIODARONE HCL 200 MG PO TABS
400.0000 mg | ORAL_TABLET | Freq: Two times a day (BID) | ORAL | Status: DC
  Administered 2024-08-10 – 2024-08-12 (×5): 400 mg via ORAL
  Filled 2024-08-10 (×5): qty 2

## 2024-08-10 NOTE — Progress Notes (Signed)
 Physical Therapy Treatment Patient Details Name: Maria Trujillo MRN: [id number] DOB: [birth year] Today's Date: 08/10/2024   History of Present Illness 89 y.o. female with medical history significant for CAD with prior NSTEMI, HTN, HLD, persistent A-fib on Eliquis  and amiodarone , complicated by post conversion  pauses/bradycardia with syncope 04/2024 s/p pacemaker on 07/24/2024 -(Eliquis  held 9/26 to 9/30), being admitted for small bowel obstruction.    PT Comments  Pt demos improved acitivity tolerance/strength and is able to amb with CGA for 39ft using SPC. Pt requires min A for supine<>sit for LE management. Assistance provided for transfer onto [facility]. Pt able to tolerate prolonged stand without LOB. Will continue to follow pt acutely to continue working on strength/activity tolerance/balance to promote safety and return to optimal level of function.     If plan is discharge home, recommend the following: A lot of help with walking and/or transfers;A lot of help with bathing/dressing/bathroom;Help with stairs or ramp for entrance   Can travel by private vehicle     No  Equipment Recommendations  Other (comment) (TBD at next venue)    Recommendations for Other Services       Precautions / Restrictions Precautions Precautions: Fall;ICD/Pacemaker Recall of Precautions/Restrictions: Intact Restrictions Weight Bearing Restrictions Per Provider Order: No Other Position/Activity Restrictions: L UE pacemaker precautions -limit pushing and pulling     Mobility  Bed Mobility Overal bed mobility: Needs Assistance Bed Mobility: Supine to Sit, Sit to Supine     Supine to sit: Min assist, HOB elevated Sit to supine: Min assist   General bed mobility comments: min A for LE assist    Transfers Overall transfer level: Needs assistance Equipment used: Straight cane Transfers: Sit to/from Stand, Bed to chair/wheelchair/BSC Sit to Stand: Min assist   Step pivot transfers: Min assist        General transfer comment: min A to stand from lowest bed height. Cuing for sequencing.    Ambulation/Gait Ambulation/Gait assistance: Contact guard assist Gait Distance (Feet): 40 Feet Assistive device: Straight cane Gait Pattern/deviations: Step-through pattern, Decreased stride length, Trunk flexed, Narrow base of support Gait velocity: decreased     General Gait Details: No LOB. Seems to have a good understanding of her activity tolerance, which was monitored throughout amb.   Stairs             Wheelchair Mobility     Tilt Bed    Modified Rankin (Stroke Patients Only)       Balance Overall balance assessment: Needs assistance Sitting-balance support: Feet supported Sitting balance-Leahy Scale: Good Sitting balance - Comments: supervision seated EOB   Standing balance support: Single extremity supported, During functional activity, Reliant on assistive device for balance Standing balance-Leahy Scale: Fair Standing balance comment: no LOB with static stand. Able to perform standing peri-care with CGA                            Communication Communication Communication: No apparent difficulties  Cognition Arousal: Alert Behavior During Therapy: WFL for tasks assessed/performed   PT - Cognitive impairments: No apparent impairments                       PT - Cognition Comments: pleasant and agreeable to PT session Following commands: Intact      Cueing Cueing Techniques: Verbal cues  Exercises Other Exercises Other Exercises: Assistance onto [facility]. cuing for sequencing.    General Comments  Pertinent Vitals/Pain Pain Assessment Pain Assessment: No/denies pain    Home Living                          Prior Function            PT Goals (current goals can now be found in the care plan section) Acute Rehab PT Goals PT Goal Formulation: With patient Time For Goal Achievement: 08/19/24 Potential to Achieve  Goals: Fair Progress towards PT goals: Progressing toward goals    Frequency    Min 2X/week      PT Plan      Co-evaluation              AM-PAC PT 6 Clicks Mobility   Outcome Measure  Help needed turning from your back to your side while in a flat bed without using bedrails?: A Little Help needed moving from lying on your back to sitting on the side of a flat bed without using bedrails?: A Little Help needed moving to and from a bed to a chair (including a wheelchair)?: A Little Help needed standing up from a chair using your arms (e.g., wheelchair or bedside chair)?: A Little Help needed to walk in hospital room?: A Little Help needed climbing 3-5 steps with a railing? : Total 6 Click Score: 16    End of Session Equipment Utilized During Treatment: Gait belt Activity Tolerance: Patient tolerated treatment well Patient left: in bed;with call bell/phone within reach;with family/visitor present;with SCD's reapplied Nurse Communication: Mobility status PT Visit Diagnosis: Unsteadiness on feet (R26.81);Muscle weakness (generalized) (M62.81);Difficulty in walking, not elsewhere classified (R26.2)     Time: 1348-1410 PT Time Calculation (min) (ACUTE ONLY): 22 min  Charges:                            Jewett Mcgann, SPT    Marjoria Mancillas 08/10/2024, 2:39 PM

## 2024-08-10 NOTE — TOC Progression Note (Addendum)
 Transition of Care [facility]) - Progression Note    Patient Details  Name: Maria Trujillo MRN: [id number] Date of Birth: 1934-07-26  [facility] CM/SW Contact  Maria JAYSON Carpen, LCSW Phone Number: 08/10/2024, 10:30 AM  Clinical Narrative:   Per surgery note, start weaning TPN today. FL2 updated and sent out to local SNF's. Per TOC note from the weekend, preferences are [facility] and [facility].  2:51 pm: [facility] offered a bed. [facility] declined. CSW went by room and updated patient and daughter. They are considering taking her home with home health based on her improvements so far if the team feels like that is a safe plan. Sent secure chat to PT and OT to notify. Patient has a cane, BSC, ramp, raised toilet, walk-in shower, and shower seat. They also have access to a wheelchair if needed. Patient does not have a walker at home. Discussed home health agencies. Will send out referral to determine who can accept her.  Expected Discharge Plan and Services                                               Social Drivers of Health (SDOH) Interventions SDOH Screenings   Food Insecurity: No Food Insecurity (07/30/2024)  Housing: Low Risk  (07/30/2024)  Transportation Needs: No Transportation Needs (07/30/2024)  Utilities: Not At Risk (07/30/2024)  Depression (PHQ2-9): Low Risk  (04/29/2021)  Financial Resource Strain: Low Risk  (01/20/2024)   Received from [facility]  Physical Activity: Inactive (11/03/2018)   Received from [facility]  Social Connections: Moderately Isolated (07/30/2024)  Stress: No Stress Concern Present (11/03/2018)   Received from [facility]  Tobacco Use: Low Risk  (08/03/2024)  Health Literacy: Low Risk  (02/03/2021)   Received from [facility]    Readmission Risk Interventions     No data to display

## 2024-08-10 NOTE — Progress Notes (Signed)
 Nutrition Follow-up  DOCUMENTATION CODES:   Not applicable  INTERVENTION:   -TPN management per pharmacy -Continue Ensure Plus High Protein po TID, each supplement provides 350 kcal and 20 grams of protein -MVI po once TPN discontinued  -Continue daily weights  -RD will follow for diet advancement and adjust supplement regimen as appropriate  NUTRITION DIAGNOSIS:   Inadequate oral intake related to altered GI function as evidenced by NPO status.  Ongoing  GOAL:   Patient will meet greater than or equal to 90% of their needs  Met with TPN  MONITOR:   PO intake, Supplement acceptance, Diet advancement, Labs, Weight trends, Skin, I & O's  REASON FOR ASSESSMENT:   Consult New TPN/TNA  ASSESSMENT:   89 y/o female with h/o HTN, CAD, NSTEMI, MI, cholecystectomy, hypothyroidism, bradycardia/syncope s/p pacemaker (9/29), Afib s/p cardioversion (2024), GERD, HLD, RLS and kidney stones who is admitted with SBO now s/p exploratory laparotomy, reduction of internal hernia, small bowel resection (~15cm) with primary anastomosis and repair ventral hernia 10/9.  Reviewed I/O's: +2.6 L x 24 hours and +7 L since admission  UOP: 800 ml x 24 hours  Spoke with Ms Tailor and daughter at bedside. Pt is pleasant and in good spirits today; feels good and slept well last night. Daughter states she is back to being herself; smiling and joking with this RD throughout visit. She shares that she has been a little fearful with eating and decided to stay on clear liquids yesterday because I didn't want to rush my diet advancement. She feels ready for full liquids today and ordered tray with pudding, grits, coffee, and yogurt. She shares that she has been drinking Ensure; consumed 90% of an Ensure yesterday and this morning. Noted meal completions 25-100%.   She remains on TPN at 70 ml/hr, which provides 1697 kcals and 84 grams protein, meeting 100% of estimated kcal and protein needs.   Discussed  importance of good meal and supplement intake to promote healing. RD also discussed parameters to wean TPN; encouraged consumption of meals and supplements. Reviewed plan of care; Ms Diamond and daughter agreeable.   Weight has been stable over the past week.   Medications reviewed and include protonix .   Labs reviewed: CBGS: 96-115 (inpatient orders for glycemic control are 0-9 units insulin aspart TID with meals).    Diet Order:   Diet Order             Diet full liquid Room service appropriate? Yes; Fluid consistency: Thin  Diet effective 1400                   EDUCATION NEEDS:   Education needs have been addressed  Skin:  Skin Assessment: Skin Integrity Issues: Skin Integrity Issues:: Incisions Incisions: closed abdomen  Last BM:  08/08/24  Height:   Ht Readings from Last 1 Encounters:  07/30/24 5' 4 (1.626 m)    Weight:   Wt Readings from Last 1 Encounters:  08/10/24 74.6 kg    Ideal Body Weight:  54.5 kg  BMI:  Body mass index is 28.23 kg/m.  Estimated Nutritional Needs:   Kcal:  1600-1800kcal/day  Protein:  80-90g/day  Fluid:  1.4-1.6L/day    Margery ORN, RD, LDN, CDCES Registered Dietitian III Certified Diabetes Care and Education Specialist If unable to reach this RD, please use RD Inpatient group chat on secure chat between hours of 8am-4 pm daily

## 2024-08-10 NOTE — Progress Notes (Signed)
 PT Cancellation Note  Patient Details Name: Maria Trujillo MRN: [id number] DOB: 10/24/1934   Cancelled Treatment:    Reason Eval/Treat Not Completed: Fatigue/lethargy limiting ability to participate Patient sleeping on arrival. Daughter states she has had a busy morning and requests that I return later today.  Lakaisha Danish 08/10/2024, 10:56 AM

## 2024-08-10 NOTE — Plan of Care (Signed)

## 2024-08-10 NOTE — Progress Notes (Signed)
 Progress Note   Patient: Maria Trujillo FMW:[id number] DOB: [birth year] DOA: 07/30/2024     10 DOS: the patient was seen and examined on 08/10/2024   Brief hospital course: Partly taken from H&P.  Maria Trujillo is a 89 y.o. female with medical history significant for CAD with prior NSTEMI, HTN, HLD, persistent A-fib on Eliquis  and amiodarone , complicated by post conversion  pauses/bradycardia with syncope 04/2024 s/p pacemaker on 07/24/2024 -(Eliquis  held 9/26 to 9/30), being admitted for small bowel obstruction.  She presented with intractable nonbloody, nonbilious, non-coffee-ground vomiting starting on the night prior to arrival associated with lower abdominal pain.   On presentation stable vitals, labs with leukocytosis of 12.4 and UA with some ketones otherwise stable labs.  CT abdomen and pelvis showed SBO with suspected transition point in the right lower quadrant and mesenteric edema with free fluid.  NG tube was placed and general surgery was consulted.  10/6: Vital stable, improving leukocytosis now at 11.3, Significant drainage of about 1700 cc with NG tube. Continuing conservative management and general surgery might do Gastrografin challenge tomorrow if remained unchanged.  10/7: Vital stable, still no bowel movement or flatus, repeat KUB with persistently dilated loops of bowel with scattered air-fluid levels.  Continuing with conservative management, general surgery might try clamping NG tube today.  10/8: Hemodynamically stable, NG tube was removed and patient was started on clear liquid diet, still no BM or flatus, KUB with persistently dilated loops of bowel.  Surgery would like to give her a trial of some p.o. intake before doing Gastrografin challenge.  Surgery later decided to take her to the OR tomorrow morning, failed Gastrografin challenge.  10/9: Vital stable, s/p exploratory laparotomy with reduction of internal hernia and resection of small bowel with primary anastomosis  and adhesion lysis. General surgery wants her to be started on TPN as expecting prolonged ileus, patient recently had a pacemaker in place and cardiology is little hesitant to use a central line.  IR was consulted to avoid pacemaker leads.  10/10: Hemodynamically stable but still no bowel function.  IR was consulted for central line placement because of her recent pacemaker, followed by starting TPN.  Foley catheter was removed to give her a voiding trial.  Patient is very frail, consulting palliative care to clarify goals of care.  Currently full code.  10/16: Vital stable, started getting bowel function and NG tube was removed on 10/13.  Patient was slowly started on clear liquid diet and advance to soft.  Started tapering TPN today.  Patient received 1 unit of PRBC on 10/15 for concern of melena and Eliquis  was held.  IV amiodarone  is being transition to p.o. today.  PT is recommending SNF but patient would like to go home with daughter, maximum home health services ordered.  Assessment and Plan: * SBO (small bowel obstruction) (HCC) S/p laparotomy with some bowel resection and adhesion of lysis along with internal hernia reduction on 08/03/2024.  NG tube eventually removed on 10/31 patient was started on diet, currently tolerating soft diet with bowel function. TPN is being tapered off. - Continue supportive care  Coronary artery disease with history of NSTEMI Blood pressure currently borderline soft. - Holding home meds -Continue metoprolol   Status cardiac pacemaker 07/24/2024 for sinus pause with syncope History of sinus pauses on antiarrhythmics No acute issues suspected Patient denies chest pain, shortness of breath or lightheadedness Troponin negative. Central line was placed by IR for concern of recent pacemaker leads  Melena Patient developed  melena requiring 1 unit of PRBC on 10/15, hemoglobin improved to 8.9 today. - Keep holding Eliquis  - Monitor CBC - Transfuse if  below 8  Paroxysmal atrial fibrillation (HCC) Chronic anticoagulation Patient developed A-fib with RVR during current hospitalization and was placed on amiodarone  infusion by cardiology 4 days.  - IV amiodarone  is being switched with p.o. today -Continue with metoprolol  -Eliquis  is being held for concern of melena requiring blood transfusion.  HTN (hypertension) Blood pressure currently soft Keep holding home antihypertensives.  Acquired hypothyroidism Holding home Synthroid  while she is n.p.o. Will resume once able to take by mouth   Subjective: Patient was sitting in chair comfortably when seen today.  Had a bowel movement and tolerating soft diet.  Feeling much improved and wants to go home, daughter will help.  Physical Exam: Vitals:   08/10/24 0500 08/10/24 0733 08/10/24 1212 08/10/24 1613  BP:  (!) 95/54 (!) 104/52 (!) 95/57  Pulse:  95 97 95  Resp:    16  Temp:  97.9 F (36.6 C) 97.8 F (36.6 C) 97.7 F (36.5 C)  TempSrc:  Oral  Oral  SpO2:  97% 100% 100%  Weight: 74.6 kg     Height:       General.  Frail and malnourished elderly lady, in no acute distress. Pulmonary.  Lungs clear bilaterally, normal respiratory effort. CV.  Regular rate and rhythm, no JVD, rub or murmur. Abdomen.  Soft, nontender, nondistended, BS positive. CNS.  Alert and oriented .  No focal neurologic deficit. Extremities.  No edema,  pulses intact and symmetrical. Psychiatry.  Judgment and insight appears normal.    Data Reviewed: Prior data reviewed  Family Communication: Discussed with daughter at bedside  Disposition: Status is: Inpatient Remains inpatient appropriate because: Severity of illness  Planned Discharge Destination: Home  DVT prophylaxis.  Lovenox  Time spent: 50 minutes  This record has been created using Conservation officer, historic buildings. Errors have been sought and corrected,but may not always be located. Such creation errors do not reflect on the standard of care.    Author: Amaryllis Dare, MD 08/10/2024 4:20 PM  For on call review [email or web address].

## 2024-08-10 NOTE — Progress Notes (Signed)
 [facility] SURGICAL PROGRESS NOTE  Hospital Day(s): 10.   Post op day(s): 7 Days Post-Op.   Interval History:  Patient seen and examined No acute events or new complaints overnight.  She is doing well Expected abdominal soreness No fever, chills, nausea, emesis  Leukocytosis continues to fluctuate; WBC 13.5K Hgb to 9.9; responded to transfusion Renal function normal; sCr - 0.57; UO - 800 ccs + unmeasured No electrolyte derangements No longer with bloody bowel movements FLD: no issue  Vital signs in last 24 hours: [min-max] current  Temp:  [97.5 F (36.4 C)-98.4 F (36.9 C)] 98 F (36.7 C) (10/16 0410) Pulse Rate:  [76-107] 107 (10/16 0410) Resp:  [16-20] 17 (10/16 0410) BP: (90-106)/(45-67) 102/67 (10/16 0410) SpO2:  [98 %-100 %] 100 % (10/16 0410) Weight:  [74.6 kg] 74.6 kg (10/16 0500)     Height: 5' 4 (162.6 cm) Weight: 74.6 kg BMI (Calculated): 28.22   Intake/Output last 2 shifts:  10/15 0701 - 10/16 0700 In: 3352.1 [P.O.:420; I.V.:2332.7; Blood:332; IV Piggyback:267.4] Out: 800 [Urine:800]   Physical Exam:  Constitutional: alert, cooperative and no distress  Respiratory: breathing non-labored at rest  Cardiovascular: regular rate and irregular Gastrointestinal: soft, incisional soreness, and non-distended. No rebound/guarding Integumentary: Laparotomy is CDI with staples, no erythema   Labs:     Latest Ref Rng & Units 08/10/2024    5:35 AM 08/09/2024    4:56 AM 08/08/2024   10:37 AM  CBC  WBC 4.0 - 10.5 K/uL 13.5  8.8    Hemoglobin 12.0 - 15.0 g/dL 9.9  7.8  8.9   Hematocrit 36.0 - 46.0 % 30.0  23.7  27.3   Platelets 150 - 400 K/uL 247  188        Latest Ref Rng & Units 08/10/2024    5:35 AM 08/09/2024    4:56 AM 08/08/2024    4:41 AM  CMP  Glucose 70 - 99 mg/dL 893  893  868   BUN 8 - 23 mg/dL 20  23  34   Creatinine 0.44 - 1.00 mg/dL 9.42  9.32  9.40   Sodium 135 - 145 mmol/L 137  136  137   Potassium 3.5 - 5.1 mmol/L 4.6  4.5   4.3   Chloride 98 - 111 mmol/L 105  109  109   CO2 22 - 32 mmol/L 22  21  21    Calcium  8.9 - 10.3 mg/dL 9.0  8.6  9.3   Total Protein 6.5 - 8.1 g/dL 5.4  4.6    Total Bilirubin 0.0 - 1.2 mg/dL 0.4  0.3    Alkaline Phos 38 - 126 U/L 89  59    AST 15 - 41 U/L 71  53    ALT 0 - 44 U/L 122  79       Imaging studies: No new pertinent imaging studies   Assessment/Plan: 89 y.o. female 7 Days Post-Op s/p exploratory laparotomy, reduction of internal hernia, SBR, and repair of ventral hernia    - We can do soft diet for lunch today   - Begin to wean TPN this morning; Hopefully DC this tomorrow (10/17)  - Monitor H&H; improved after 1 unit pRBC yesterday   - Appreciate cardiology assistance; Continue to hold Eliquis  given concern for anemia/history of melena - potentially restart in next 24-48 hours   - Monitor abdominal examination; on-going bowel function   - Pain control prn; antiemetics prn  - Mobilize; Okay to work with PT   -  Further management per primary service; we will follow     - Discharge Planning: She is doing better; Continued bowel function no without melena. Hgb remains stable. We will begin to advance diet and wean from TPN in next 24-48 hours.   All of the above findings and recommendations were discussed with the patient, patient's family (daughter at bedside), and the medical team, and all of patient's and family's questions were answered to their expressed satisfaction.  -- Arthea Platt, PA-C [facility] 08/10/2024, 7:27 AM M-F: 7am - 4pm

## 2024-08-10 NOTE — NC FL2 (Signed)
 [place]  MEDICAID [id number] LEVEL OF CARE FORM     IDENTIFICATION  Patient Name: Maria Trujillo Birthdate: 1933-12-08 Sex: female Admission Date (Current Location): 07/30/2024  [facility] and [place] Number:  Chiropodist and Address:  [facility], [address] [place], [place] [address]      Provider Number: [id number]  Attending Physician Name and Address:  Caleen Qualia, MD  Relative Name and Phone Number:       Current Level of Care: Hospital Recommended Level of Care: Skilled Nursing Facility Prior Approval Number:    Date Approved/Denied:   PASRR Number: [id number]  Discharge Plan: SNF    Current Diagnoses: Patient Active Problem List   Diagnosis Date Noted   Small bowel obstruction (HCC) 07/31/2024   SBO (small bowel obstruction) (HCC) 07/30/2024   Coronary artery disease with history of NSTEMI 07/30/2024   Status cardiac pacemaker 07/24/2024 for sinus pause with syncope 07/30/2024   Paroxysmal atrial fibrillation (HCC) 07/30/2024   Chronic anticoagulation 07/30/2024   Atrial fibrillation with rapid ventricular response (HCC) 05/23/2024   Hypercoagulable state due to persistent atrial fibrillation (HCC) 06/04/2023   Persistent atrial fibrillation (HCC) 05/24/2023   Acquired hypothyroidism 12/25/2020   Nontraumatic complete tear of right rotator cuff 12/25/2020   NSTEMI (non-ST elevated myocardial infarction) (HCC) 11/03/2020   Primary open angle glaucoma (POAG) of both eyes, severe stage 11/01/2018   Primary osteoarthritis involving multiple joints 02/18/2017   Esophageal reflux 02/15/2017   HTN (hypertension) 02/15/2017   S/P total knee arthroplasty 02/15/2017   Hyperlipidemia, unspecified 03/06/2014   Restless leg syndrome 03/06/2014    Orientation RESPIRATION BLADDER Height & Weight     Self, Time, Situation, Place  Normal Continent Weight: 164 lb 7.4 oz (74.6 kg) Height:  5' 4 (162.6 cm)  BEHAVIORAL SYMPTOMS/MOOD  NEUROLOGICAL BOWEL NUTRITION STATUS   (None)  (None) Continent Diet (Soft diet. Advancing as tolerated. Will start weaning TPN today (10/16) and hopefully discontinue it tomorrow.)  AMBULATORY STATUS COMMUNICATION OF NEEDS Skin   Limited Assist Verbally Bruising, Surgical wounds, Other (Comment) (Erythema/redness. Incision on abdomen: Staples, open to air.)                       Personal Care Assistance Level of Assistance  Bathing, Feeding, Dressing Bathing Assistance: Limited assistance Feeding assistance: Limited assistance Dressing Assistance: Limited assistance     Functional Limitations Info  Sight, Hearing, Speech Sight Info: Adequate Hearing Info: Adequate Speech Info: Adequate    SPECIAL CARE FACTORS FREQUENCY  PT (By licensed PT), OT (By licensed OT)     PT Frequency: 5 x week OT Frequency: 5 x week            Contractures Contractures Info: Not present    Additional Factors Info  Code Status, Allergies Code Status Info: Full code Allergies Info: Celecoxib, Dorzolamide, Penicillins, Shellfish Allergy, Timolol  Maleate, Sulfa Antibiotics, Amoxicillin, Amoxicillin-pot Clavulanate, Brimonidine, Cefuroxime Axetil, Dipivefrin, Oxycodone            Current Medications (08/10/2024):  This is the current hospital active medication list Current Facility-Administered Medications  Medication Dose Route Frequency Provider Last Rate Last Admin   acetaminophen  (TYLENOL ) tablet 1,000 mg  1,000 mg Oral Q6H Pabon, Diego F, MD   1,000 mg at 08/10/24 9075   amiodarone  (NEXTERONE  PREMIX) 360-4.14 MG/200ML-% (1.8 mg/mL) IV infusion  30 mg/hr Intravenous Continuous Perley Lum DEL, RPH 16.67 mL/hr at 08/10/24 0543 30 mg/hr at 08/10/24 0543   Chlorhexidine  Gluconate  Cloth 2 % PADS 6 each  6 each Topical Q0600 Jordis Laneta FALCON, MD   6 each at 08/10/24 0520   diphenhydrAMINE  (BENADRYL ) injection 25 mg  25 mg Intravenous Q6H PRN Schulz, Zachary R, PA-C       feeding supplement  (ENSURE PLUS HIGH PROTEIN) liquid 237 mL  237 mL Oral TID BM Jens Durand, MD   237 mL at 08/09/24 2110   hydrALAZINE  (APRESOLINE ) injection 5 mg  5 mg Intravenous Q4H PRN Pabon, Diego F, MD       insulin aspart (novoLOG) injection 0-9 Units  0-9 Units Subcutaneous TID WC Jens Durand, MD   2 Units at 08/08/24 1229   latanoprost  (XALATAN ) 0.005 % ophthalmic solution 1 drop  1 drop Right Eye QHS Pabon, [place] F, MD   1 drop at 08/09/24 2111   levothyroxine  (SYNTHROID ) tablet 100 mcg  100 mcg Oral QAC breakfast Pabon, Laneta F, MD   100 mcg at 08/10/24 9480   metoprolol  succinate (TOPROL -XL) 24 hr tablet 12.5 mg  12.5 mg Oral Daily Hudson, Caralyn, PA-C   12.5 mg at 08/10/24 9075   metoprolol  tartrate (LOPRESSOR ) injection 2.5 mg  2.5 mg Intravenous Q5 min PRN Jordis Laneta F, MD   1 mg at 08/03/24 9257   morphine  (PF) 2 MG/ML injection 2 mg  2 mg Intravenous Q2H PRN Pabon, Diego F, MD       ondansetron  (ZOFRAN ) tablet 4 mg  4 mg Oral Q6H PRN Pabon, Diego F, MD   4 mg at 08/02/24 1156   Or   ondansetron  (ZOFRAN ) injection 4 mg  4 mg Intravenous Q6H PRN Pabon, Laneta F, MD   4 mg at 08/08/24 0946   pantoprazole  (PROTONIX ) injection 40 mg  40 mg Intravenous Q24H Pabon, Diego F, MD   40 mg at 08/10/24 0924   phenol (CHLORASEPTIC) mouth spray 1 spray  1 spray Mouth/Throat PRN Jens Durand, MD   1 spray at 08/05/24 1259   promethazine  (PHENERGAN ) 12.5 mg in sodium chloride  0.9 % 50 mL IVPB  12.5 mg Intravenous Q6H PRN Jordis Laneta F, MD 150 mL/hr at 08/10/24 0543 Infusion Verify at 08/10/24 0543   timolol  (TIMOPTIC ) 0.5 % ophthalmic solution 1 drop  1 drop Right Eye Daily Jordis Laneta F, MD   1 drop at 08/10/24 0925   TPN ADULT (ION)   Intravenous Continuous TPN Nazari, Walid A, RPH 70 mL/hr at 08/10/24 0543 Infusion Verify at 08/10/24 0543     Discharge Medications: Please see discharge summary for a list of discharge medications.  Relevant Imaging Results:  Relevant Lab Results:   Additional  Information SS#: [id number]  [name] JAYSON Carpen, LCSW

## 2024-08-10 NOTE — Progress Notes (Signed)
 PHARMACY - TOTAL PARENTERAL NUTRITION CONSULT NOTE   Indication: Small bowel obstruction  Patient Measurements: Height: 5' 4 (162.6 cm) Weight: 74.6 kg (164 lb 7.4 oz) IBW/kg (Calculated) : 54.7 TPN AdjBW (KG): 59.6 Body mass index is 28.23 kg/m. Usual Weight: UNK  Assessment: 89yo female admitted with intractable vomiting and small bowel obstruction. Patient has PMH cholecystectomy, CAD, NSTEMI, HTN, HLD, A. Fib, and recent pacemaker. She has been NPO/clear liquids since admission. Patient reports Hives allergy to Shellfish. Allergy was confirmed with family to be to ALL seafood.   Glucose / Insulin: BG 96-109 (0 units of SSI used in the last 24 hours) Electrolytes: WNL Renal: Scr <1 Hepatic: LFTs WNL Intake / Output; MIVF: net +7.2L GI Imaging:  10/8 CT/ABDOMEN PELVIS Complex small bowel obstruction with 2 points of obstruction  GI Surgeries / Procedures: S/P exploratory laparotomy, small bowel resection, reduction of internal hernia, and repair or ventral hernia on 10/9  Central access: PICC order Placed 10/10 AM TPN start date: 08/04/2024  Nutritional Goals: Goal TPN rate is 70 mL/hr (provides 84g of protein and 1696 kcals per day)  RD Assessment: Estimated Needs Total Energy Estimated Needs: 1600-1800kcal/day Total Protein Estimated Needs: 80-90g/day Total Fluid Estimated Needs: 1.4-1.6L/day  Current Nutrition:  NPO,   Plan:  Reduce TPN rate to 35 mL/hr at 1800, in hope to wean to off tomorrow.  Electrolytes in TPN: Na 3mEq/L, K 52mEq/L, Ca 64mEq/L, Mg 81mEq/L, and Phos 15mmol/L. Cl:Ac 1:1 Due to allergy to ALL Seafoods- INTRALIPIDS used instead of SMOFLIPIDS Add standard MVI and trace elements to TPN Thiamine 100mg  IV x 7 days ordered outside of bag.  Continue Sensitive q6h SSI and adjust as needed  Monitor TPN labs on Mon/Thurs. BMP, Phos, and Mag monitored daily until stable.   Dhanya Bogle A Riyansh Gerstner, PharmD Clinical Pharmacist 08/10/2024 10:50 AM

## 2024-08-10 NOTE — Assessment & Plan Note (Addendum)
 Patient developed melena requiring 1 unit of PRBC on 10/15, hemoglobin improved to 8.9 today. - Keep holding Eliquis  - Monitor CBC - Transfuse if below 8

## 2024-08-10 NOTE — Progress Notes (Signed)
 [facility] CLINIC CARDIOLOGY PROGRESS NOTE       Patient ID: Maria Trujillo MRN: [id number] DOB/AGE: November 14, 1933 89 y.o.  Admit date: 07/30/2024 Referring Physician Dr. Amaryllis Dare Primary Physician Feldpausch, Cheryl BRAVO, MD  Primary Cardiologist Dr. Wilburn Reason for Consultation AF RVR  HPI: Maria Trujillo is a 89 y.o. female  with a past medical history of  persistent atrial fibrillation (DCCV 02/2023, on Tikosyn ), hypertension, hyperlipidemia, history bradycardia, history NSTEMI (10/2020), coronary artery disease s/p stent to RCA (10/2020) who presented to the ED on 07/30/2024 for intractable vomiting and abdominal pain. Admitted for small bowel obstruction/ileus. 08/04/2024 HR elevated, AF RVR. Cardiology was consulted for further evaluation.   Interval history:  - Patient seen and examined this AM, resting in bed with daughter at bedside. - Feeling better overall today compared to yesterday.  Denies any recurrence of bloody bowel movements and nausea and vomiting have improved. Planning for FLD this AM.  - HR around 100 this AM before metoprolol . She is asymptomatic. BP borderline.   Review of systems complete and found to be negative unless listed above    Past Medical History:  Diagnosis Date   Arthritis    Atrial fibrillation (HCC)    History of kidney stones    Hypothyroidism    Myocardial infarct (HCC)    PONV (postoperative nausea and vomiting)     Past Surgical History:  Procedure Laterality Date   BOWEL RESECTION N/A 08/03/2024   Procedure: EXCISION, SMALL INTESTINE;  Surgeon: Jordis Laneta FALCON, MD;  Location: [facility] ORS;  Service: General;  Laterality: N/A;   BREAST EXCISIONAL BIOPSY Bilateral 1984   benign   CARDIOVERSION N/A 03/04/2023   Procedure: CARDIOVERSION;  Surgeon: Ammon Blunt, MD;  Location: [facility] ORS;  Service: Cardiovascular;  Laterality: N/A;   CATARACT EXTRACTION, BILATERAL     CHOLECYSTECTOMY     EYE SURGERY     JOINT REPLACEMENT Left 12/2012   knee    KNEE ARTHROPLASTY Right 02/15/2017   Procedure: COMPUTER ASSISTED TOTAL KNEE ARTHROPLASTY;  Surgeon: Lynwood SHAUNNA Hue, MD;  Location: [facility] ORS;  Service: Orthopedics;  Laterality: Right;   LAPAROTOMY N/A 08/03/2024   Procedure: LAPAROTOMY, EXPLORATORY;  Surgeon: Jordis Laneta FALCON, MD;  Location: [facility] ORS;  Service: General;  Laterality: N/A;   LEFT HEART CATH AND CORONARY ANGIOGRAPHY N/A 11/04/2020   Procedure: LEFT HEART CATH AND CORONARY ANGIOGRAPHY;  Surgeon: Ammon Blunt, MD;  Location: [facility] INVASIVE CV LAB;  Service: Cardiovascular;  Laterality: N/A;   PACEMAKER IMPLANT N/A 07/24/2024   Procedure: PACEMAKER IMPLANT;  Surgeon: Cindie Ole DASEN, MD;  Location: [facility] INVASIVE CV LAB;  Service: Cardiovascular;  Laterality: N/A;   PHOTOCOAGULATION WITH LASER Right 04/20/2018   Procedure: TRANSCLERAL DIODE CYCLOPHOTOCOAGULATION  RIGHT per Hope block;  Surgeon: Mittie Gaskin, MD;  Location: [facility];  Service: Ophthalmology;  Laterality: Right;   ROTATOR CUFF REPAIR Left 2010   TONSILLECTOMY     VENTRAL HERNIA REPAIR N/A 08/03/2024   Procedure: REPAIR, HERNIA, VENTRAL;  Surgeon: Jordis Laneta FALCON, MD;  Location: [facility] ORS;  Service: General;  Laterality: N/A;    Medications Prior to Admission  Medication Sig Dispense Refill Last Dose/Taking   acetaminophen  (TYLENOL ) 650 MG CR tablet Take 650 mg by mouth daily as needed for pain.   Taking As Needed   amiodarone  (PACERONE ) 200 MG tablet Take 2 tablets (400 mg total) by mouth 2 (two) times daily for 10 days, THEN 1 tablet (200 mg total) daily. (Patient taking differently:  (  200 mg total) daily.) 70 tablet 0 07/30/2024 Morning   apixaban  (ELIQUIS ) 5 MG TABS tablet Take 1 tablet (5 mg total) by mouth 2 (two) times daily. 180 tablet 3 07/30/2024 Morning   atorvastatin  (LIPITOR ) 40 MG tablet Take 40 mg by mouth at bedtime.   07/29/2024 Bedtime   azelastine  (ASTELIN ) 0.1 % nasal spray Place 1 spray into both nostrils daily.   07/30/2024  Morning   bimatoprost (LUMIGAN) 0.03 % ophthalmic solution Place 1 drop into the right eye at bedtime.   07/29/2024 Bedtime   Calcium  Carb-Cholecalciferol  (CALCIUM  CARBONATE-VITAMIN D3) 600-400 MG-UNIT TABS Take 1 tablet by mouth daily.   07/30/2024 Morning   Carboxymethylcellulose Sodium (THERATEARS) 0.25 % SOLN Place 1 drop into both eyes daily as needed (Dry eyes).   Taking As Needed   Cholecalciferol  (VITAMIN D ) 50 MCG (2000 UT) tablet Take 2,000 Units by mouth daily.   07/30/2024 Morning   Docusate Sodium  100 MG capsule Take 100 mg by mouth daily as needed for mild constipation or moderate constipation.   Taking As Needed   fexofenadine (ALLEGRA) 180 MG tablet Take 180 mg by mouth at bedtime.   07/29/2024 Bedtime   folic acid (FOLVITE) 400 MCG tablet Take 400 mcg by mouth daily.   Taking   ipratropium (ATROVENT) 0.06 % nasal spray Place 1 spray into both nostrils daily as needed for rhinitis.   Taking As Needed   isosorbide  mononitrate (IMDUR ) 30 MG 24 hr tablet Take 1 tablet (30 mg total) by mouth daily. 30 tablet 2 07/30/2024 Morning   levothyroxine  (SYNTHROID , LEVOTHROID) 100 MCG tablet Take 100 mcg by mouth daily before breakfast.   07/30/2024 Morning   lisinopril  (ZESTRIL ) 5 MG tablet Take 1 tablet (5 mg total) by mouth daily. 30 tablet 2 07/30/2024 Morning   LUMIGAN 0.01 % SOLN Place 1 drop into the right eye at bedtime.   Taking   metoprolol  tartrate (LOPRESSOR ) 25 MG tablet Take 0.5 tablets (12.5 mg total) by mouth daily as needed. for heart rate over 100.  Home med. 10 tablet 1 Unknown   Multiple Vitamin (MULTIVITAMIN WITH MINERALS) TABS tablet Take 1 tablet by mouth daily.   07/30/2024 Morning   Multiple Vitamins-Minerals (PRESERVISION AREDS 2 PO) Take 2 tablets by mouth 2 (two) times daily.   Taking   pantoprazole  (PROTONIX ) 40 MG tablet Take 40 mg by mouth daily.   07/30/2024 Morning   prednisoLONE acetate (PRED FORTE) 1 % ophthalmic suspension Place 1 drop into the right eye 4 (four) times  daily.   Taking   timolol  (TIMOPTIC ) 0.5 % ophthalmic solution Place 1 drop into the right eye in the morning.   07/30/2024 Morning   vitamin B-12 (CYANOCOBALAMIN ) 100 MCG tablet Take 100 mcg by mouth daily.   07/30/2024 Morning   furosemide  (LASIX ) 20 MG tablet Take 20 mg by mouth daily. prn (Patient not taking: Reported on 07/21/2024)      Social History   Socioeconomic History   Marital status: Widowed    Spouse name: Not on file   Number of children: Not on file   Years of education: Not on file   Highest education level: Not on file  Occupational History   Not on file  Tobacco Use   Smoking status: Never   Smokeless tobacco: Never  Vaping Use   Vaping status: Never Used  Substance and Sexual Activity   Alcohol  use: No   Drug use: No   Sexual activity: Not on file  Other Topics  Concern   Not on file  Social History Narrative   Not on file   Social Drivers of Health   Financial Resource Strain: Low Risk  (01/20/2024)   Received from [facility]   Overall Financial Resource Strain (CARDIA)    Difficulty of Paying Living Expenses: Not hard at all  Food Insecurity: No Food Insecurity (07/30/2024)   Hunger Vital Sign    Worried About Running Out of Food in the Last Year: Never true    Ran Out of Food in the Last Year: Never true  Transportation Needs: No Transportation Needs (07/30/2024)   PRAPARE - Administrator, Civil Service (Medical): No    Lack of Transportation (Non-Medical): No  Physical Activity: Inactive (11/03/2018)   Received from [facility]   Exercise Vital Sign    Days of Exercise per Week: 0 days    Minutes of Exercise per Session: 0 min  Stress: No Stress Concern Present (11/03/2018)   Received from [facility] - Occupational Stress Questionnaire    Feeling of Stress : Not at all  Social Connections: Moderately Isolated (07/30/2024)   Social Connection and Isolation Panel     Frequency of Communication with Friends and Family: Three times a week    Frequency of Social Gatherings with Friends and Family: Once a week    Attends Religious Services: More than 4 times per year    Active Member of [employer] or Organizations: No    Attends Banker Meetings: Never    Marital Status: Widowed  Intimate Partner Violence: Not At Risk (07/30/2024)   Humiliation, Afraid, Rape, and Kick questionnaire    Fear of Current or Ex-Partner: No    Emotionally Abused: No    Physically Abused: No    Sexually Abused: No    Family History  Problem Relation Age of Onset   Breast cancer Sister 34     Vitals:   08/09/24 2321 08/10/24 0410 08/10/24 0500 08/10/24 0733  BP: (!) 95/53 102/67  (!) 95/54  Pulse: 84 (!) 107  95  Resp: 17 17    Temp: 98.4 F (36.9 C) 98 F (36.7 C)  97.9 F (36.6 C)  TempSrc:    Oral  SpO2: 98% 100%  97%  Weight:   74.6 kg   Height:        PHYSICAL EXAM General: Ill-appearing elderly female, well nourished, in no acute distress. HEENT: Normocephalic and atraumatic. NGT in place. Neck: No JVD.  Lungs: Normal respiratory effort on room air. Clear bilaterally to auscultation. No wheezes, crackles, rhonchi.  Heart: Irregularly irregular, controlled rate. Normal S1 and S2 without gallops or murmurs.  Abdomen: Non-distended appearing.  Msk: Normal strength and tone for age. Extremities: Warm and well perfused. No clubbing, cyanosis.  No edema.  Neuro: Alert and oriented X 3. Psych: Answers questions appropriately.   Labs: Basic Metabolic Panel: Recent Labs    08/09/24 0456 08/10/24 0535  NA 136 137  K 4.5 4.6  CL 109 105  CO2 21* 22  GLUCOSE 106* 106*  BUN 23 20  CREATININE 0.67 0.57  CALCIUM  8.6* 9.0  MG 1.9 2.3  PHOS 3.0 2.9   Liver Function Tests: Recent Labs    08/09/24 0456 08/10/24 0535  AST 53* 71*  ALT 79* 122*  ALKPHOS 59 89  BILITOT 0.3 0.4  PROT 4.6* 5.4*  ALBUMIN 2.0* 2.5*   No results for input(s):  LIPASE, AMYLASE in the last 72 hours. CBC: Recent Labs    08/09/24 0456 08/10/24 0535  WBC 8.8 13.5*  HGB 7.8* 9.9*  HCT 23.7* 30.0*  MCV 93.7 92.6  PLT 188 247   Cardiac Enzymes: No results for input(s): CKTOTAL, CKMB, CKMBINDEX, TROPONINIHS in the last 72 hours. BNP: No results for input(s): BNP in the last 72 hours. D-Dimer: No results for input(s): DDIMER in the last 72 hours. Hemoglobin A1C: No results for input(s): HGBA1C in the last 72 hours.  Fasting Lipid Panel: No results for input(s): CHOL, HDL, LDLCALC, TRIG, CHOLHDL, LDLDIRECT in the last 72 hours.  Thyroid  Function Tests: No results for input(s): TSH, T4TOTAL, T3FREE, THYROIDAB in the last 72 hours.  Invalid input(s): FREET3 Anemia Panel: Recent Labs    08/09/24 0456  FERRITIN 215  TIBC 216*  IRON 22*     Radiology: IR PICC PLACEMENT RIGHT >5 YRS INC IMG GUIDE Result Date: 08/04/2024 INDICATION: 89 year old female with recently placed left subclavian approach cardiac rhythm maintenance device. Unfortunately, she requires TPN and therefore IR is consulted for PICC placement. EXAM: PICC LINE PLACEMENT WITH ULTRASOUND AND FLUOROSCOPIC GUIDANCE MEDICATIONS: None. ANESTHESIA/SEDATION: None. FLUOROSCOPY TIME:  Radiation exposure index: 2.9 mGy, air kerma COMPLICATIONS: SIR Level A - No therapy, no consequence. Inadvertent arterial puncture by the APP initially performing the procedure. The complication was recognized immediately and pressure held for 25 minutes. No evidence of hematoma. Physician then took over and completed placement of PICC. PROCEDURE: The patient was advised of the possible risks and complications and agreed to undergo the procedure. The patient was then brought to the angiographic suite for the procedure. The right arm was prepped with chlorhexidine , draped in the usual sterile fashion using maximum barrier technique (cap and mask, sterile gown, sterile  gloves, large sterile sheet, hand hygiene and cutaneous antisepsis) and infiltrated locally with 1% Lidocaine . Ultrasound demonstrated patency of the right brachial vein, and this was documented with an image. Under real-time ultrasound guidance, this vein was accessed with a 21 gauge micropuncture needle and image documentation was performed. A 0.018 wire was introduced in to the vein. Over this, a 5 [place] dual lumen power-injectable PICC was advanced to the lower SVC. Fluoroscopy during the procedure and fluoro spot radiograph confirms appropriate catheter position. The catheter was flushed and covered with a sterile dressing. Catheter length: 32 cm IMPRESSION: Successful right arm Power PICC line placement with ultrasound and fluoroscopic guidance. The catheter is ready for use. Electronically Signed   By: Wilkie Lent M.D.   On: 08/04/2024 16:44   US  EKG SITE RITE Result Date: 08/03/2024 If Site Rite image not attached, placement could not be confirmed due to current cardiac rhythm.  DG Abd 1 View Result Date: 08/02/2024 CLINICAL DATA:  Nasogastric tube placement EXAM: ABDOMEN - 1 VIEW COMPARISON:  None Available. FINDINGS: No nasogastric tube is identified within visualized lung bases and upper abdomen. Dilated fluid-filled loops of bowel noted on prior CT examination is not well appreciated on this exam. No free intraperitoneal gas. Pacemaker in place. IMPRESSION: 1. No nasogastric tube identified. Correlate for malposition within the oropharynx is recommended. Electronically Signed   By: Dorethia Molt M.D.   On: 08/02/2024 23:33   CT ABDOMEN PELVIS W CONTRAST Result Date: 08/02/2024 EXAM: CT ABDOMEN AND PELVIS WITH CONTRAST 08/02/2024 07:14:10 PM TECHNIQUE: CT of the abdomen and pelvis was performed with the administration of intravenous contrast. Multiplanar reformatted images are provided for review. Automated exposure control, iterative reconstruction, and/or  weight-based adjustment of the  mA/kV was utilized to reduce the radiation dose to as low as reasonably achievable. COMPARISON: None available. CLINICAL HISTORY: Abdominal pain, acute, nonlocalized. 89 y.o. female with medical history significant for CAD with prior NSTEMI, HTN, HLD, persistent A-fib on Eliquis  and amiodarone , complicated by post conversion pauses/bradycardia with syncope 04/2024 s/p pacemaker on 07/24/2024 -(Eliquis  held 9/26 to 9/30), being admitted for small bowel obstruction. She presented with intractable nonbloody, nonbilious, non-coffee-ground vomiting starting on the night prior to arrival associated with lower abdominal pain. FINDINGS: LOWER CHEST: Pacemaker leads within the right heart. Cardiac size within normal limits. Moderate coronary artery calcification. LIVER: The liver is unremarkable. GALLBLADDER AND BILE DUCTS: Status post cholecystectomy. SPLEEN: No acute abnormality. PANCREAS: No acute abnormality. ADRENAL GLANDS: No acute abnormality. KIDNEYS, URETERS AND BLADDER: No stones in the kidneys or ureters. No hydronephrosis. No perinephric or periureteral stranding. Urinary bladder is unremarkable. GI AND BOWEL: There was a complex small bowel obstruction with 2 points of transitioning close proximity seen within the right hemipelvis on axial image 63, series 2 and coronal image 45 series 5. The proximal small bowel is dilated and fluid-filled. However, the intervening small bowel within the pelvis appears not only dilated and fluid-filled but demonstrates extensive mesenteric edema in keeping with a closed loop obstruction of this segment. The terminal small bowel is decompressed. The colon is unremarkable. The stomach is unremarkable. PERITONEUM AND RETROPERITONEUM: Trace free fluid within the pelvis. No free intraperitoneal gas. No ascites. VASCULATURE: Moderate aortoiliac atherosclerotic calcification. No aortic aneurysm. Aorta is normal in caliber. LYMPH NODES: No lymphadenopathy. REPRODUCTIVE ORGANS: Uterus  absent. No adnexal masses. BONES AND SOFT TISSUES: Osseous structures are age appropriate. No acute bone abnormality. No lytic or blastic bone lesion. IMPRESSION: 1. Complex small bowel obstruction with 2 points of obstruction in close proximity likely related to an underlying adhesion within the right hemipelvis. Resultant closed loop obstruction of the intervening small bowel in the right hemipelvis, evidenced by dilated, fluid-filled intervening small bowel with extensive mesenteric edema. No free intraperitoneal gas. 2. Trace free fluid within the pelvis. 3. These results will be called to the ordering clinician or representative by the radiologist assistant, and communication documented in the pacs or clario dashboard. Electronically signed by: Dorethia Molt MD 08/02/2024 07:40 PM EDT RP Workstation: [id number]   DG ABD ACUTE 2+V W 1V CHEST Result Date: 08/02/2024 CLINICAL DATA:  Left hand small bowel obstruction EXAM: DG ABDOMEN ACUTE WITH 1 VIEW CHEST COMPARISON:  August 01, 2024 FINDINGS: Interval removal of enteric tube. Similar appearance of dilated loops of small bowel in the upper abdomen with persistent air-fluid levels again identified. No free air. Cholecystectomy clips. Unchanged cardiac pacemaker.  No acute osseous findings. IMPRESSION: Interval removal of enteric tube. Similar appearance of dilated loop of small bowel in the upper abdomen with persistent air-fluid levels. Electronically Signed   By: Michaeline Blanch M.D.   On: 08/02/2024 11:23   DG ABD ACUTE 2+V W 1V CHEST Result Date: 08/01/2024 CLINICAL DATA:  Small bowel obstruction EXAM: DG ABDOMEN ACUTE WITH 1 VIEW CHEST COMPARISON:  July 30, 2024 FINDINGS: Unchanged enteric tube with tip in side hole projecting over the stomach. Scattered air-fluid levels with a few dilated loops of small bowel again identified in the upper abdomen, although there is paucity of visualized bowel gas limiting evaluation somewhat. No free air. No focal  consolidations. No pleural effusions or pneumothorax. Left chest wall pacemaker. IMPRESSION: Enteric tube in place. Paucity of visualized bowel  gas, limiting evaluation, although there appears to be persistent dilated loop of bowel in the upper abdomen with scattered air-fluid levels again identified. Electronically Signed   By: Michaeline Blanch M.D.   On: 08/01/2024 10:57   DG Abd 1 View Result Date: 07/30/2024 CLINICAL DATA:  Nasogastric tube placement. EXAM: ABDOMEN - 1 VIEW COMPARISON:  CT earlier today FINDINGS: Tip and side port of the enteric tube below the diaphragm in the stomach. Excreted IV contrast in the renal collecting systems from prior CT. Dilated small bowel in CT is fluid-filled and not well demonstrated by radiograph. IMPRESSION: Tip and side port of the enteric tube below the diaphragm in the stomach. Electronically Signed   By: Andrea Gasman M.D.   On: 07/30/2024 22:48   CT ABDOMEN PELVIS W CONTRAST Result Date: 07/30/2024 CLINICAL DATA:  Abdominal pain. Intractable vomiting. Recent pacemaker placement. Prior cholecystectomy. EXAM: CT ABDOMEN AND PELVIS WITH CONTRAST TECHNIQUE: Multidetector CT imaging of the abdomen and pelvis was performed using the standard protocol following bolus administration of intravenous contrast. RADIATION DOSE REDUCTION: This exam was performed according to the departmental dose-optimization program which includes automated exposure control, adjustment of the mA and/or kV according to patient size and/or use of iterative reconstruction technique. CONTRAST:  OMNIPAQUE  IOHEXOL  300 MG/ML  SOLN COMPARISON:  Noncontrast CT 12/11/2014 FINDINGS: Lower chest: Pacemaker leads overlie the right atrium and ventricle. No pericardial effusion. No basilar airspace disease or pleural effusion. Coronary artery calcifications. Hepatobiliary: No focal liver abnormality. Cholecystectomy. Mild central intrahepatic biliary ductal dilatation. No common bile duct dilatation.  Pancreas: No ductal dilatation or inflammation. Spleen: Normal in size without focal abnormality. Adrenals/Urinary Tract: No adrenal nodule. No hydronephrosis. No renal calculi. No suspicious renal abnormality. Partially distended urinary bladder, normal for degree of distension. Stomach/Bowel: Dilated fluid-filled stomach. Dilated fluid-filled small bowel. Suspected transition point in the right lower quadrant, series 2, image 61, the distal small bowel is decompressed. There is mesenteric edema and free fluid. No bowel pneumatosis. Small to moderate volume of stool in the colon. No colonic inflammation. Normal appendix is potentially visualized. Vascular/Lymphatic: Aortic atherosclerosis and tortuosity. No aortic aneurysm. Patent portal vein. No portal venous or mesenteric gas. No suspicious lymphadenopathy. Reproductive: Normal for age. Other: Generalized mesenteric edema. Free fluid in the mesentery, abdomen and pelvis. No free air or focal fluid collection. Musculoskeletal: Multilevel degenerative change in the spine. There are no acute or suspicious osseous abnormalities. IMPRESSION: 1. Small bowel obstruction with suspected transition point in the right lower quadrant. Mesenteric edema and free fluid. 2. Aortic Atherosclerosis (ICD10-I70.0). Electronically Signed   By: Andrea Gasman M.D.   On: 07/30/2024 20:27   DG Chest 2 View Result Date: 07/24/2024 CLINICAL DATA:  Pacemaker. EXAM: CHEST - 2 VIEW COMPARISON:  Radiograph 07/05/2024 FINDINGS: Dual lead left-sided pacemaker with lead tips projecting over the right atrium and ventricle.The cardiomediastinal contours are normal. Aortic atherosclerosis. Unchanged elevation of right hemidiaphragm. Pulmonary vasculature is normal. No consolidation, pleural effusion, or pneumothorax. Chronic distal left clavicle deformity. No acute osseous abnormalities are seen. IMPRESSION: Dual lead left-sided pacemaker with lead tips projecting over the right atrium and  ventricle. No pneumothorax. Electronically Signed   By: Andrea Gasman M.D.   On: 07/24/2024 18:38   EP PPM/ICD IMPLANT Result Date: 07/24/2024  CONCLUSIONS:  1. Symptomatic bradycardia due to tachycardia-bradycardia syndrome  2. Successful dual chamber permanent pacemaker implantation  3.  No early apparent complications.  4. Hold Eliquis  and Aspirin  for 5 days (OK to restart  July 30, 2024)    ECHO 05/2024: NORMAL LEFT VENTRICULAR SYSTOLIC FUNCTION WITH MILD LVH  ESTIMATED EF: 55%  NORMAL LA PRESSURES WITH DIASTOLIC DYSFUNCTION (GRADE 1)  NORMAL RIGHT VENTRICULAR SYSTOLIC FUNCTION  VALVULAR REGURGITATION: No AR, MILD MR, No PR, MILD TR  ESTIMATED RVSP: 36 mmHg (Normal)  NO VALVULAR STENOSIS   TELEMETRY reviewed by me 08/10/2024: atrial fibrillation rate 100s  EKG reviewed by me: NSR rate 60 bpm  Data reviewed by me 08/10/2024: last 24h vitals tele labs imaging I/O hospitalist progress note, general surgery notes  Principal Problem:   SBO (small bowel obstruction) (HCC) Active Problems:   HTN (hypertension)   Acquired hypothyroidism   Coronary artery disease with history of NSTEMI   Status cardiac pacemaker 07/24/2024 for sinus pause with syncope   Paroxysmal atrial fibrillation (HCC)   Chronic anticoagulation   Small bowel obstruction (HCC)    ASSESSMENT AND PLAN:  EMYA PICADO is a 89 y.o. female  with a past medical history of  persistent atrial fibrillation (DCCV 02/2023, on Tikosyn ), hypertension, hyperlipidemia, history bradycardia, history NSTEMI (10/2020), coronary artery disease s/p stent to RCA (10/2020) who presented to the ED on 07/30/2024 for intractable vomiting and abdominal pain. Admitted for small bowel obstruction/ileus. 08/04/2024 HR elevated, AF RVR. Cardiology was consulted for further evaluation.   # Small bowel obstruction s/p ex lap and resection 08/03/24 # Atrial fibrillation RVR # Paroxysmal atrial fibrillation # Coronary artery disease #  Hypertension Patient initially presented with nausea/vomiting related to small bowel obstruction.  Underwent exploratory laparotomy yesterday for small bowel resection.  Has a history of atrial fibrillation, underwent cardioversion last year and had been maintaining sinus rhythm on amiodarone  at home.  Developed atrial fibrillation RVR this morning.  Remains NPO. -Continue IV amiodarone  for rhythm control. Transition to PO this afternoon.  - Continue metoprolol  succiante 12.5 mg daily.  Will closely monitor patient's BP. -Eliquis  currently held given bloody BMs, anemia.  Ideally would like this resumed once anemia improves and has no recurrence of bloody BMs. Per surgery note 08/10/24 - consider resuming in next 24-48 hours.  -As needed IV metoprolol  for elevated heart rate as well as as needed IV hydralazine  for elevated BP.  This patient's plan of care was discussed and created with Dr. Florencio and he is in agreement.  Signed: Danita Bloch, PA-C  08/10/2024, 9:34 AM [facility]

## 2024-08-11 ENCOUNTER — Inpatient Hospital Stay

## 2024-08-11 ENCOUNTER — Ambulatory Visit: Admitting: Cardiology

## 2024-08-11 DIAGNOSIS — K429 Umbilical hernia without obstruction or gangrene: Secondary | ICD-10-CM | POA: Diagnosis not present

## 2024-08-11 DIAGNOSIS — Z7901 Long term (current) use of anticoagulants: Secondary | ICD-10-CM | POA: Diagnosis not present

## 2024-08-11 DIAGNOSIS — K56609 Unspecified intestinal obstruction, unspecified as to partial versus complete obstruction: Secondary | ICD-10-CM | POA: Diagnosis not present

## 2024-08-11 DIAGNOSIS — K529 Noninfective gastroenteritis and colitis, unspecified: Secondary | ICD-10-CM | POA: Diagnosis not present

## 2024-08-11 DIAGNOSIS — Z950 Presence of cardiac pacemaker: Secondary | ICD-10-CM | POA: Diagnosis not present

## 2024-08-11 DIAGNOSIS — I4821 Permanent atrial fibrillation: Secondary | ICD-10-CM | POA: Diagnosis not present

## 2024-08-11 LAB — CBC
HCT: 27.8 % — ABNORMAL LOW (ref 36.0–46.0)
Hemoglobin: 9.1 g/dL — ABNORMAL LOW (ref 12.0–15.0)
MCH: 30.5 pg (ref 26.0–34.0)
MCHC: 32.7 g/dL (ref 30.0–36.0)
MCV: 93.3 fL (ref 80.0–100.0)
Platelets: 255 K/uL (ref 150–400)
RBC: 2.98 MIL/uL — ABNORMAL LOW (ref 3.87–5.11)
RDW: 14.4 % (ref 11.5–15.5)
WBC: 15 K/uL — ABNORMAL HIGH (ref 4.0–10.5)
nRBC: 0 % (ref 0.0–0.2)

## 2024-08-11 LAB — BASIC METABOLIC PANEL WITH GFR
Anion gap: 6 (ref 5–15)
BUN: 21 mg/dL (ref 8–23)
CO2: 24 mmol/L (ref 22–32)
Calcium: 8.8 mg/dL — ABNORMAL LOW (ref 8.9–10.3)
Chloride: 106 mmol/L (ref 98–111)
Creatinine, Ser: 0.61 mg/dL (ref 0.44–1.00)
GFR, Estimated: >60 mL/min (ref >60)
Glucose, Bld: 96 mg/dL (ref 70–99)
Potassium: 4.6 mmol/L (ref 3.5–5.1)
Sodium: 136 mmol/L (ref 135–145)

## 2024-08-11 LAB — PHOSPHORUS: Phosphorus: 3.7 mg/dL (ref 2.5–4.6)

## 2024-08-11 LAB — MAGNESIUM: Magnesium: 2 mg/dL (ref 1.7–2.4)

## 2024-08-11 LAB — GLUCOSE, CAPILLARY
Glucose-Capillary: 99 mg/dL (ref 70–99)
Glucose-Capillary: 111 mg/dL — ABNORMAL HIGH (ref 70–99)

## 2024-08-11 MED ORDER — IOHEXOL 300 MG/ML  SOLN
80.0000 mL | Freq: Once | INTRAMUSCULAR | Status: AC | PRN
  Administered 2024-08-11: 80 mL via INTRAVENOUS

## 2024-08-11 MED ORDER — IOHEXOL 9 MG/ML PO SOLN
500.0000 mL | ORAL | Status: AC
  Administered 2024-08-11 (×2): 500 mL via ORAL

## 2024-08-11 NOTE — Progress Notes (Signed)
 Occupational Therapy Treatment Patient Details Name: Maria Trujillo MRN: [id number] DOB: [birth year] Today's Date: 08/11/2024   History of present illness 89 y.o. female with medical history significant for CAD with prior NSTEMI, HTN, HLD, persistent A-fib on Eliquis  and amiodarone , complicated by post conversion  pauses/bradycardia with syncope 04/2024 s/p pacemaker on 07/24/2024 -(Eliquis  held 9/26 to 9/30), being admitted for small bowel obstruction.   OT comments  Pt seen for OT treatment session this date. Pt making gradual progress towards goals, limited in ambulation and ADL activity due to fatigue. Pt performs bed mobility with MIN A, cues to limit use of LUE during all mobility and STS transfers. Squat pivot bed > BSC for continent urine void, MAX A for pericare from STS, and pt uses personal SPC to ambulate short distance 12 ft in room MIN A. Pt fatigued after activity but grateful for the opportunity to mobilize. Discharge recommendation appropriate, OT will continue to follow. If pt and daughter decline STR, pt would benefit from [facility] OT services.       If plan is discharge home, recommend the following:  A lot of help with walking and/or transfers;A lot of help with bathing/dressing/bathroom;Assistance with cooking/housework;Assist for transportation;Help with stairs or ramp for entrance   Equipment Recommendations  Other (comment)       Precautions / Restrictions Precautions Precautions: Fall;ICD/Pacemaker Recall of Precautions/Restrictions: Intact Precaution/Restrictions Comments: PICC Restrictions Weight Bearing Restrictions Per Provider Order: No Other Position/Activity Restrictions: L UE pacemaker precautions -limit pushing and pulling       Mobility Bed Mobility Overal bed mobility: Needs Assistance Bed Mobility: Supine to Sit, Sit to Supine     Supine to sit: Min assist, HOB elevated Sit to supine: Min assist   General bed mobility comments: min A for LE assist     Transfers Overall transfer level: Needs assistance Equipment used: Straight cane Transfers: Sit to/from Stand, Bed to chair/wheelchair/BSC Sit to Stand: Contact guard assist   Squat pivot transfers: Min assist       General transfer comment: CGA to perform STS from bed, squat pivot to BSC, cues to not use LUE to push up     Balance Overall balance assessment: Needs assistance Sitting-balance support: Feet supported Sitting balance-Leahy Scale: Good     Standing balance support: Single extremity supported, During functional activity, Reliant on assistive device for balance Standing balance-Leahy Scale: Fair                             ADL either performed or assessed with clinical judgement   ADL Overall ADL's : Needs assistance/impaired                         Toilet Transfer: Minimal assistance;BSC/3in1;Squat-pivot;Cueing for sequencing;Cueing for safety Toilet Transfer Details (indicate cue type and reason): cues for sequencing, hand placement and to avoid pushing using LUE for transfers. bed > BSC squat pivot Toileting- Clothing Manipulation and Hygiene: Maximal assistance;Sit to/from stand Toileting - Clothing Manipulation Details (indicate cue type and reason): from STS     Functional mobility during ADLs: Minimal assistance General ADL Comments: pt completes bed > BSC for urine void, functional mobility 12 ft in room using SPC and up to MIN A steadying assist     Communication Communication Communication: No apparent difficulties   Cognition Arousal: Alert Behavior During Therapy: WFL for tasks assessed/performed Cognition: No apparent impairments  Following commands: Intact        Cueing   Cueing Techniques: Verbal cues             Pertinent Vitals/ Pain       Pain Assessment Pain Assessment: No/denies pain   Frequency  Min 2X/week        Progress Toward Goals  OT  Goals(current goals can now be found in the care plan section)  Progress towards OT goals: Progressing toward goals  Acute Rehab OT Goals OT Goal Formulation: With patient/family Time For Goal Achievement: 08/18/24 Potential to Achieve Goals: Fair ADL Goals Pt Will Perform Upper Body Bathing: with supervision;sitting Pt Will Perform Lower Body Dressing: with supervision;sit to/from stand Pt Will Transfer to Toilet: with supervision;ambulating Pt Will Perform Toileting - Clothing Manipulation and hygiene: with supervision;sit to/from stand  Plan         AM-PAC OT 6 Clicks Daily Activity     Outcome Measure   Help from another person eating meals?: None Help from another person taking care of personal grooming?: A Little Help from another person toileting, which includes using toliet, bedpan, or urinal?: A Lot Help from another person bathing (including washing, rinsing, drying)?: A Lot Help from another person to put on and taking off regular upper body clothing?: A Little Help from another person to put on and taking off regular lower body clothing?: A Lot 6 Click Score: 16    End of Session Equipment Utilized During Treatment:  (SPC)  OT Visit Diagnosis: Unsteadiness on feet (R26.81);Repeated falls (R29.6);Muscle weakness (generalized) (M62.81)   Activity Tolerance Patient limited by fatigue   Patient Left in bed;with call bell/phone within reach;with family/visitor present;with bed alarm set   Nurse Communication Mobility status        Time: 8566-8546 OT Time Calculation (min): 20 min  Charges: OT General Charges $OT Visit: 1 Visit OT Treatments $Self Care/Home Management : 8-22 mins  Hazelynn Mckenny L. Noni Stonesifer, OTR/L  08/11/24, 3:39 PM

## 2024-08-11 NOTE — Progress Notes (Signed)
 [facility] CLINIC CARDIOLOGY PROGRESS NOTE       Patient ID: Maria Trujillo MRN: [id number] DOB/AGE: [birth year] 88 y.o.  Admit date: 07/30/2024 Referring Physician Dr. Amaryllis Dare Primary Physician Feldpausch, Cheryl BRAVO, MD  Primary Cardiologist Dr. Wilburn Reason for Consultation AF RVR  HPI: ZARIYAH STEPHENS is a 89 y.o. female  with a past medical history of  persistent atrial fibrillation (DCCV 02/2023, on Tikosyn ), hypertension, hyperlipidemia, history bradycardia, history NSTEMI (10/2020), coronary artery disease s/p stent to RCA (10/2020) who presented to the ED on 07/30/2024 for intractable vomiting and abdominal pain. Admitted for small bowel obstruction/ileus. 08/04/2024 HR elevated, AF RVR. Cardiology was consulted for further evaluation.   Interval history:  - Patient seen and examined this AM, resting in bed with daughter at bedside. - Feeling well this AM, tolerating diet.  - HR improved to 80-90s this AM, BP stable without significant dizziness/ligtheadedness.   Review of systems complete and found to be negative unless listed above    Past Medical History:  Diagnosis Date   Arthritis    Atrial fibrillation (HCC)    History of kidney stones    Hypothyroidism    Myocardial infarct (HCC)    PONV (postoperative nausea and vomiting)     Past Surgical History:  Procedure Laterality Date   BOWEL RESECTION N/A 08/03/2024   Procedure: EXCISION, SMALL INTESTINE;  Surgeon: Jordis Laneta FALCON, MD;  Location: [facility] ORS;  Service: General;  Laterality: N/A;   BREAST EXCISIONAL BIOPSY Bilateral 1984   benign   CARDIOVERSION N/A 03/04/2023   Procedure: CARDIOVERSION;  Surgeon: Ammon Blunt, MD;  Location: [facility] ORS;  Service: Cardiovascular;  Laterality: N/A;   CATARACT EXTRACTION, BILATERAL     CHOLECYSTECTOMY     EYE SURGERY     JOINT REPLACEMENT Left 12/2012   knee   KNEE ARTHROPLASTY Right 02/15/2017   Procedure: COMPUTER ASSISTED TOTAL KNEE ARTHROPLASTY;  Surgeon: Lynwood SHAUNNA Hue, MD;  Location: [facility] ORS;  Service: Orthopedics;  Laterality: Right;   LAPAROTOMY N/A 08/03/2024   Procedure: LAPAROTOMY, EXPLORATORY;  Surgeon: Jordis Laneta FALCON, MD;  Location: [facility] ORS;  Service: General;  Laterality: N/A;   LEFT HEART CATH AND CORONARY ANGIOGRAPHY N/A 11/04/2020   Procedure: LEFT HEART CATH AND CORONARY ANGIOGRAPHY;  Surgeon: Ammon Blunt, MD;  Location: [facility] INVASIVE CV LAB;  Service: Cardiovascular;  Laterality: N/A;   PACEMAKER IMPLANT N/A 07/24/2024   Procedure: PACEMAKER IMPLANT;  Surgeon: Cindie Ole DASEN, MD;  Location: [facility] INVASIVE CV LAB;  Service: Cardiovascular;  Laterality: N/A;   PHOTOCOAGULATION WITH LASER Right 04/20/2018   Procedure: TRANSCLERAL DIODE CYCLOPHOTOCOAGULATION  RIGHT per Hope block;  Surgeon: Mittie Gaskin, MD;  Location: [facility];  Service: Ophthalmology;  Laterality: Right;   ROTATOR CUFF REPAIR Left 2010   TONSILLECTOMY     VENTRAL HERNIA REPAIR N/A 08/03/2024   Procedure: REPAIR, HERNIA, VENTRAL;  Surgeon: Jordis Laneta FALCON, MD;  Location: [facility] ORS;  Service: General;  Laterality: N/A;    Medications Prior to Admission  Medication Sig Dispense Refill Last Dose/Taking   acetaminophen  (TYLENOL ) 650 MG CR tablet Take 650 mg by mouth daily as needed for pain.   Taking As Needed   amiodarone  (PACERONE ) 200 MG tablet Take 2 tablets (400 mg total) by mouth 2 (two) times daily for 10 days, THEN 1 tablet (200 mg total) daily. (Patient taking differently:  (200 mg total) daily.) 70 tablet 0 07/30/2024 Morning   apixaban  (ELIQUIS ) 5 MG TABS tablet Take 1 tablet (5  mg total) by mouth 2 (two) times daily. 180 tablet 3 07/30/2024 Morning   atorvastatin  (LIPITOR ) 40 MG tablet Take 40 mg by mouth at bedtime.   07/29/2024 Bedtime   azelastine  (ASTELIN ) 0.1 % nasal spray Place 1 spray into both nostrils daily.   07/30/2024 Morning   bimatoprost (LUMIGAN) 0.03 % ophthalmic solution Place 1 drop into the right eye at bedtime.   07/29/2024  Bedtime   Calcium  Carb-Cholecalciferol  (CALCIUM  CARBONATE-VITAMIN D3) 600-400 MG-UNIT TABS Take 1 tablet by mouth daily.   07/30/2024 Morning   Carboxymethylcellulose Sodium (THERATEARS) 0.25 % SOLN Place 1 drop into both eyes daily as needed (Dry eyes).   Taking As Needed   Cholecalciferol  (VITAMIN D ) 50 MCG (2000 UT) tablet Take 2,000 Units by mouth daily.   07/30/2024 Morning   Docusate Sodium  100 MG capsule Take 100 mg by mouth daily as needed for mild constipation or moderate constipation.   Taking As Needed   fexofenadine (ALLEGRA) 180 MG tablet Take 180 mg by mouth at bedtime.   07/29/2024 Bedtime   folic acid (FOLVITE) 400 MCG tablet Take 400 mcg by mouth daily.   Taking   ipratropium (ATROVENT) 0.06 % nasal spray Place 1 spray into both nostrils daily as needed for rhinitis.   Taking As Needed   isosorbide  mononitrate (IMDUR ) 30 MG 24 hr tablet Take 1 tablet (30 mg total) by mouth daily. 30 tablet 2 07/30/2024 Morning   levothyroxine  (SYNTHROID , LEVOTHROID) 100 MCG tablet Take 100 mcg by mouth daily before breakfast.   07/30/2024 Morning   lisinopril  (ZESTRIL ) 5 MG tablet Take 1 tablet (5 mg total) by mouth daily. 30 tablet 2 07/30/2024 Morning   LUMIGAN 0.01 % SOLN Place 1 drop into the right eye at bedtime.   Taking   metoprolol  tartrate (LOPRESSOR ) 25 MG tablet Take 0.5 tablets (12.5 mg total) by mouth daily as needed. for heart rate over 100.  Home med. 10 tablet 1 Unknown   Multiple Vitamin (MULTIVITAMIN WITH MINERALS) TABS tablet Take 1 tablet by mouth daily.   07/30/2024 Morning   Multiple Vitamins-Minerals (PRESERVISION AREDS 2 PO) Take 2 tablets by mouth 2 (two) times daily.   Taking   pantoprazole  (PROTONIX ) 40 MG tablet Take 40 mg by mouth daily.   07/30/2024 Morning   prednisoLONE acetate (PRED FORTE) 1 % ophthalmic suspension Place 1 drop into the right eye 4 (four) times daily.   Taking   timolol  (TIMOPTIC ) 0.5 % ophthalmic solution Place 1 drop into the right eye in the morning.    07/30/2024 Morning   vitamin B-12 (CYANOCOBALAMIN ) 100 MCG tablet Take 100 mcg by mouth daily.   07/30/2024 Morning   furosemide  (LASIX ) 20 MG tablet Take 20 mg by mouth daily. prn (Patient not taking: Reported on 07/21/2024)      Social History   Socioeconomic History   Marital status: Widowed    Spouse name: Not on file   Number of children: Not on file   Years of education: Not on file   Highest education level: Not on file  Occupational History   Not on file  Tobacco Use   Smoking status: Never   Smokeless tobacco: Never  Vaping Use   Vaping status: Never Used  Substance and Sexual Activity   Alcohol  use: No   Drug use: No   Sexual activity: Not on file  Other Topics Concern   Not on file  Social History Narrative   Not on file   Social Drivers of Health  Financial Resource Strain: Low Risk  (01/20/2024)   Received from [facility]   Overall Financial Resource Strain (CARDIA)    Difficulty of Paying Living Expenses: Not hard at all  Food Insecurity: No Food Insecurity (07/30/2024)   Hunger Vital Sign    Worried About Running Out of Food in the Last Year: Never true    Ran Out of Food in the Last Year: Never true  Transportation Needs: No Transportation Needs (07/30/2024)   PRAPARE - Administrator, Civil Service (Medical): No    Lack of Transportation (Non-Medical): No  Physical Activity: Inactive (11/03/2018)   [facility]   Exercise Vital Sign    Days of Exercise per Week: 0 days    Minutes of Exercise per Session: 0 min  Stress: No Stress Concern Present (11/03/2018)   Received from [facility] of Occupational Health - Occupational Stress Questionnaire    Feeling of Stress : Not at all  Social Connections: Moderately Isolated (07/30/2024)   Social Connection and Isolation Panel    Frequency of Communication with Friends and Family: Three times a week    Frequency of Social Gatherings with Friends  and Family: Once a week    Attends Religious Services: More than 4 times per year    Active Member of [employer] or Organizations: No    Attends Banker Meetings: Never    Marital Status: Widowed  Intimate Partner Violence: Not At Risk (07/30/2024)   Humiliation, Afraid, Rape, and Kick questionnaire    Fear of Current or Ex-Partner: No    Emotionally Abused: No    Physically Abused: No    Sexually Abused: No    Family History  Problem Relation Age of Onset   Breast cancer Sister 53     Vitals:   08/11/24 0019 08/11/24 0500 08/11/24 0508 08/11/24 0811  BP: (!) 92/57  (!) 105/59 91/65  Pulse:   95 92  Resp: 17  17 18   Temp: 97.6 F (36.4 C)  (!) 97.5 F (36.4 C) 97.7 F (36.5 C)  TempSrc: Oral  Oral   SpO2: 100%  99% 98%  Weight:  76.6 kg    Height:        PHYSICAL EXAM General: Ill-appearing elderly female, well nourished, in no acute distress. HEENT: Normocephalic and atraumatic. NGT in place. Neck: No JVD.  Lungs: Normal respiratory effort on room air. Clear bilaterally to auscultation. No wheezes, crackles, rhonchi.  Heart: Irregularly irregular, controlled rate. Normal S1 and S2 without gallops or murmurs.  Abdomen: Non-distended appearing.  Msk: Normal strength and tone for age. Extremities: Warm and well perfused. No clubbing, cyanosis.  No edema.  Neuro: Alert and oriented X 3. Psych: Answers questions appropriately.   Labs: Basic Metabolic Panel: Recent Labs    08/10/24 0535 08/11/24 0454  NA 137 136  K 4.6 4.6  CL 105 106  CO2 22 24  GLUCOSE 106* 96  BUN 20 21  CREATININE 0.57 0.61  CALCIUM  9.0 8.8*  MG 2.3 2.0  PHOS 2.9 3.7   Liver Function Tests: Recent Labs    08/09/24 0456 08/10/24 0535  AST 53* 71*  ALT 79* 122*  ALKPHOS 59 89  BILITOT 0.3 0.4  PROT 4.6* 5.4*  ALBUMIN 2.0* 2.5*   No results for input(s): LIPASE, AMYLASE in the last 72 hours. CBC: Recent Labs    08/10/24 0535 08/11/24 0454  WBC 13.5* 15.0*  HGB  9.9* 9.1*  HCT 30.0* 27.8*  MCV 92.6 93.3  PLT 247 255   Cardiac Enzymes: No results for input(s): CKTOTAL, CKMB, CKMBINDEX, TROPONINIHS in the last 72 hours. BNP: No results for input(s): BNP in the last 72 hours. D-Dimer: No results for input(s): DDIMER in the last 72 hours. Hemoglobin A1C: No results for input(s): HGBA1C in the last 72 hours.  Fasting Lipid Panel: No results for input(s): CHOL, HDL, LDLCALC, TRIG, CHOLHDL, LDLDIRECT in the last 72 hours.  Thyroid  Function Tests: No results for input(s): TSH, T4TOTAL, T3FREE, THYROIDAB in the last 72 hours.  Invalid input(s): FREET3 Anemia Panel: Recent Labs    08/09/24 0456  FERRITIN 215  TIBC 216*  IRON 22*     Radiology: IR PICC PLACEMENT RIGHT >5 YRS INC IMG GUIDE Result Date: 08/04/2024 INDICATION: 89 year old female with recently placed left subclavian approach cardiac rhythm maintenance device. Unfortunately, she requires TPN and therefore IR is consulted for PICC placement. EXAM: PICC LINE PLACEMENT WITH ULTRASOUND AND FLUOROSCOPIC GUIDANCE MEDICATIONS: None. ANESTHESIA/SEDATION: None. FLUOROSCOPY TIME:  Radiation exposure index: 2.9 mGy, air kerma COMPLICATIONS: SIR Level A - No therapy, no consequence. Inadvertent arterial puncture by the APP initially performing the procedure. The complication was recognized immediately and pressure held for 25 minutes. No evidence of hematoma. Physician then took over and completed placement of PICC. PROCEDURE: The patient was advised of the possible risks and complications and agreed to undergo the procedure. The patient was then brought to the angiographic suite for the procedure. The right arm was prepped with chlorhexidine , draped in the usual sterile fashion using maximum barrier technique (cap and mask, sterile gown, sterile gloves, large sterile sheet, hand hygiene and cutaneous antisepsis) and infiltrated locally with 1% Lidocaine . Ultrasound  demonstrated patency of the right brachial vein, and this was documented with an image. Under real-time ultrasound guidance, this vein was accessed with a 21 gauge micropuncture needle and image documentation was performed. A 0.018 wire was introduced in to the vein. Over this, a 5 [place] dual lumen power-injectable PICC was advanced to the lower SVC. Fluoroscopy during the procedure and fluoro spot radiograph confirms appropriate catheter position. The catheter was flushed and covered with a sterile dressing. Catheter length: 32 cm IMPRESSION: Successful right arm Power PICC line placement with ultrasound and fluoroscopic guidance. The catheter is ready for use. Electronically Signed   By: Wilkie Lent M.D.   On: 08/04/2024 16:44   US  EKG SITE RITE Result Date: 08/03/2024 If Site Rite image not attached, placement could not be confirmed due to current cardiac rhythm.  DG Abd 1 View Result Date: 08/02/2024 CLINICAL DATA:  Nasogastric tube placement EXAM: ABDOMEN - 1 VIEW COMPARISON:  None Available. FINDINGS: No nasogastric tube is identified within visualized lung bases and upper abdomen. Dilated fluid-filled loops of bowel noted on prior CT examination is not well appreciated on this exam. No free intraperitoneal gas. Pacemaker in place. IMPRESSION: 1. No nasogastric tube identified. Correlate for malposition within the oropharynx is recommended. Electronically Signed   By: Dorethia Molt M.D.   On: 08/02/2024 23:33   CT ABDOMEN PELVIS W CONTRAST Result Date: 08/02/2024 EXAM: CT ABDOMEN AND PELVIS WITH CONTRAST 08/02/2024 07:14:10 PM TECHNIQUE: CT of the abdomen and pelvis was performed with the administration of intravenous contrast. Multiplanar reformatted images are provided for review. Automated exposure control, iterative reconstruction, and/or weight-based adjustment of the mA/kV was utilized to reduce the radiation dose to as low as reasonably achievable. COMPARISON: None available. CLINICAL  HISTORY: Abdominal pain, acute, nonlocalized. 89 y.o. female with medical history significant for CAD with prior NSTEMI, HTN, HLD, persistent A-fib on Eliquis  and amiodarone , complicated by post conversion pauses/bradycardia with syncope 04/2024 s/p pacemaker on 07/24/2024 -(Eliquis  held 9/26 to 9/30), being admitted for small bowel obstruction. She presented with intractable nonbloody, nonbilious, non-coffee-ground vomiting starting on the night prior to arrival associated with lower abdominal pain. FINDINGS: LOWER CHEST: Pacemaker leads within the right heart. Cardiac size within normal limits. Moderate coronary artery calcification. LIVER: The liver is unremarkable. GALLBLADDER AND BILE DUCTS: Status post cholecystectomy. SPLEEN: No acute abnormality. PANCREAS: No acute abnormality. ADRENAL GLANDS: No acute abnormality. KIDNEYS, URETERS AND BLADDER: No stones in the kidneys or ureters. No hydronephrosis. No perinephric or periureteral stranding. Urinary bladder is unremarkable. GI AND BOWEL: There was a complex small bowel obstruction with 2 points of transitioning close proximity seen within the right hemipelvis on axial image 63, series 2 and coronal image 45 series 5. The proximal small bowel is dilated and fluid-filled. However, the intervening small bowel within the pelvis appears not only dilated and fluid-filled but demonstrates extensive mesenteric edema in keeping with a closed loop obstruction of this segment. The terminal small bowel is decompressed. The colon is unremarkable. The stomach is unremarkable. PERITONEUM AND RETROPERITONEUM: Trace free fluid within the pelvis. No free intraperitoneal gas. No ascites. VASCULATURE: Moderate aortoiliac atherosclerotic calcification. No aortic aneurysm. Aorta is normal in caliber. LYMPH NODES: No lymphadenopathy. REPRODUCTIVE ORGANS: Uterus absent. No adnexal masses. BONES AND SOFT TISSUES: Osseous structures are age appropriate. No acute bone abnormality. No  lytic or blastic bone lesion. IMPRESSION: 1. Complex small bowel obstruction with 2 points of obstruction in close proximity likely related to an underlying adhesion within the right hemipelvis. Resultant closed loop obstruction of the intervening small bowel in the right hemipelvis, evidenced by dilated, fluid-filled intervening small bowel with extensive mesenteric edema. No free intraperitoneal gas. 2. Trace free fluid within the pelvis. 3. These results will be called to the ordering clinician or representative by the radiologist assistant, and communication documented in the pacs or clario dashboard. Electronically signed by: Dorethia Molt MD 08/02/2024 07:40 PM EDT RP Workstation: [id number]   DG ABD ACUTE 2+V W 1V CHEST Result Date: 08/02/2024 CLINICAL DATA:  Left hand small bowel obstruction EXAM: DG ABDOMEN ACUTE WITH 1 VIEW CHEST COMPARISON:  August 01, 2024 FINDINGS: Interval removal of enteric tube. Similar appearance of dilated loops of small bowel in the upper abdomen with persistent air-fluid levels again identified. No free air. Cholecystectomy clips. Unchanged cardiac pacemaker.  No acute osseous findings. IMPRESSION: Interval removal of enteric tube. Similar appearance of dilated loop of small bowel in the upper abdomen with persistent air-fluid levels. Electronically Signed   By: Michaeline Blanch M.D.   On: 08/02/2024 11:23   DG ABD ACUTE 2+V W 1V CHEST Result Date: 08/01/2024 CLINICAL DATA:  Small bowel obstruction EXAM: DG ABDOMEN ACUTE WITH 1 VIEW CHEST COMPARISON:  July 30, 2024 FINDINGS: Unchanged enteric tube with tip in side hole projecting over the stomach. Scattered air-fluid levels with a few dilated loops of small bowel again identified in the upper abdomen, although there is paucity of visualized bowel gas limiting evaluation somewhat. No free air. No focal consolidations. No pleural effusions or pneumothorax. Left chest wall pacemaker. IMPRESSION: Enteric tube in place. Paucity of  visualized bowel gas, limiting evaluation, although there appears to be persistent dilated loop of bowel in the upper abdomen with scattered air-fluid levels again identified.  Electronically Signed   By: Michaeline Blanch M.D.   On: 08/01/2024 10:57   DG Abd 1 View Result Date: 07/30/2024 CLINICAL DATA:  Nasogastric tube placement. EXAM: ABDOMEN - 1 VIEW COMPARISON:  CT earlier today FINDINGS: Tip and side port of the enteric tube below the diaphragm in the stomach. Excreted IV contrast in the renal collecting systems from prior CT. Dilated small bowel in CT is fluid-filled and not well demonstrated by radiograph. IMPRESSION: Tip and side port of the enteric tube below the diaphragm in the stomach. Electronically Signed   By: Andrea Gasman M.D.   On: 07/30/2024 22:48   CT ABDOMEN PELVIS W CONTRAST Result Date: 07/30/2024 CLINICAL DATA:  Abdominal pain. Intractable vomiting. Recent pacemaker placement. Prior cholecystectomy. EXAM: CT ABDOMEN AND PELVIS WITH CONTRAST TECHNIQUE: Multidetector CT imaging of the abdomen and pelvis was performed using the standard protocol following bolus administration of intravenous contrast. RADIATION DOSE REDUCTION: This exam was performed according to the departmental dose-optimization program which includes automated exposure control, adjustment of the mA and/or kV according to patient size and/or use of iterative reconstruction technique. CONTRAST:  OMNIPAQUE  IOHEXOL  300 MG/ML  SOLN COMPARISON:  Noncontrast CT 12/11/2014 FINDINGS: Lower chest: Pacemaker leads overlie the right atrium and ventricle. No pericardial effusion. No basilar airspace disease or pleural effusion. Coronary artery calcifications. Hepatobiliary: No focal liver abnormality. Cholecystectomy. Mild central intrahepatic biliary ductal dilatation. No common bile duct dilatation. Pancreas: No ductal dilatation or inflammation. Spleen: Normal in size without focal abnormality. Adrenals/Urinary Tract: No  adrenal nodule. No hydronephrosis. No renal calculi. No suspicious renal abnormality. Partially distended urinary bladder, normal for degree of distension. Stomach/Bowel: Dilated fluid-filled stomach. Dilated fluid-filled small bowel. Suspected transition point in the right lower quadrant, series 2, image 61, the distal small bowel is decompressed. There is mesenteric edema and free fluid. No bowel pneumatosis. Small to moderate volume of stool in the colon. No colonic inflammation. Normal appendix is potentially visualized. Vascular/Lymphatic: Aortic atherosclerosis and tortuosity. No aortic aneurysm. Patent portal vein. No portal venous or mesenteric gas. No suspicious lymphadenopathy. Reproductive: Normal for age. Other: Generalized mesenteric edema. Free fluid in the mesentery, abdomen and pelvis. No free air or focal fluid collection. Musculoskeletal: Multilevel degenerative change in the spine. There are no acute or suspicious osseous abnormalities. IMPRESSION: 1. Small bowel obstruction with suspected transition point in the right lower quadrant. Mesenteric edema and free fluid. 2. Aortic Atherosclerosis (ICD10-I70.0). Electronically Signed   By: Andrea Gasman M.D.   On: 07/30/2024 20:27   DG Chest 2 View Result Date: 07/24/2024 CLINICAL DATA:  Pacemaker. EXAM: CHEST - 2 VIEW COMPARISON:  Radiograph 07/05/2024 FINDINGS: Dual lead left-sided pacemaker with lead tips projecting over the right atrium and ventricle.The cardiomediastinal contours are normal. Aortic atherosclerosis. Unchanged elevation of right hemidiaphragm. Pulmonary vasculature is normal. No consolidation, pleural effusion, or pneumothorax. Chronic distal left clavicle deformity. No acute osseous abnormalities are seen. IMPRESSION: Dual lead left-sided pacemaker with lead tips projecting over the right atrium and ventricle. No pneumothorax. Electronically Signed   By: Andrea Gasman M.D.   On: 07/24/2024 18:38   EP PPM/ICD  IMPLANT Result Date: 07/24/2024  CONCLUSIONS:  1. Symptomatic bradycardia due to tachycardia-bradycardia syndrome  2. Successful dual chamber permanent pacemaker implantation  3.  No early apparent complications.  4. Hold Eliquis  and Aspirin  for 5 days (OK to restart July 30, 2024)    ECHO 05/2024: NORMAL LEFT VENTRICULAR SYSTOLIC FUNCTION WITH MILD LVH  ESTIMATED EF: 55%  NORMAL LA  PRESSURES WITH DIASTOLIC DYSFUNCTION (GRADE 1)  NORMAL RIGHT VENTRICULAR SYSTOLIC FUNCTION  VALVULAR REGURGITATION: No AR, MILD MR, No PR, MILD TR  ESTIMATED RVSP: 36 mmHg (Normal)  NO VALVULAR STENOSIS   TELEMETRY reviewed by me 08/11/2024: atrial fibrillation rate 100s  EKG reviewed by me: NSR rate 60 bpm  Data reviewed by me 08/11/2024: last 24h vitals tele labs imaging I/O hospitalist progress note, general surgery notes  Principal Problem:   SBO (small bowel obstruction) (HCC) Active Problems:   HTN (hypertension)   Acquired hypothyroidism   Coronary artery disease with history of NSTEMI   Status cardiac pacemaker 07/24/2024 for sinus pause with syncope   Paroxysmal atrial fibrillation (HCC)   Chronic anticoagulation   Small bowel obstruction (HCC)   Melena    ASSESSMENT AND PLAN:  ROSINE SOLECKI is a 89 y.o. female  with a past medical history of  persistent atrial fibrillation (DCCV 02/2023, on Tikosyn ), hypertension, hyperlipidemia, history bradycardia, history NSTEMI (10/2020), coronary artery disease s/p stent to RCA (10/2020) who presented to the ED on 07/30/2024 for intractable vomiting and abdominal pain. Admitted for small bowel obstruction/ileus. 08/04/2024 HR elevated, AF RVR. Cardiology was consulted for further evaluation.   # Small bowel obstruction s/p ex lap and resection 08/03/24 # Atrial fibrillation RVR # Paroxysmal atrial fibrillation # Coronary artery disease # Hypertension Patient initially presented with nausea/vomiting related to small bowel obstruction.  Underwent  exploratory laparotomy yesterday for small bowel resection.  Has a history of atrial fibrillation, underwent cardioversion last year and had been maintaining sinus rhythm on amiodarone  at home.  Developed atrial fibrillation RVR this morning.  Remains NPO. -Continue PO amiodarone  load. -Continue metoprolol  succiante 12.5 mg daily.  Will closely monitor patient's BP. -Eliquis  currently held given bloody BMs, anemia.  Ideally would like this resumed once anemia improves and has no recurrence of bloody BMs. Per surgery note 08/11/24 - hold Eliquis  for 2 weeks - can resume 08/21/24. -As needed IV metoprolol  for elevated heart rate as well as as needed IV hydralazine  for elevated BP.  This patient's plan of care was discussed and created with Dr. Florencio and he is in agreement.  Signed: Danita Bloch, PA-C  08/11/2024, 10:35 AM [facility]

## 2024-08-11 NOTE — Progress Notes (Signed)
 Progress Note   Patient: Maria Trujillo FMW:[id number] DOB: 1934/07/07 DOA: 07/30/2024     11 DOS: the patient was seen and examined on 08/11/2024   Brief hospital course: Partly taken from H&P.  Maria Trujillo is a 89 y.o. female with medical history significant for CAD with prior NSTEMI, HTN, HLD, persistent A-fib on Eliquis  and amiodarone , complicated by post conversion  pauses/bradycardia with syncope 04/2024 s/p pacemaker on 07/24/2024 -(Eliquis  held 9/26 to 9/30), being admitted for small bowel obstruction.  She presented with intractable nonbloody, nonbilious, non-coffee-ground vomiting starting on the night prior to arrival associated with lower abdominal pain.   On presentation stable vitals, labs with leukocytosis of 12.4 and UA with some ketones otherwise stable labs.  CT abdomen and pelvis showed SBO with suspected transition point in the right lower quadrant and mesenteric edema with free fluid.  NG tube was placed and general surgery was consulted.  10/6: Vital stable, improving leukocytosis now at 11.3, Significant drainage of about 1700 cc with NG tube. Continuing conservative management and general surgery might do Gastrografin challenge tomorrow if remained unchanged.  10/7: Vital stable, still no bowel movement or flatus, repeat KUB with persistently dilated loops of bowel with scattered air-fluid levels.  Continuing with conservative management, general surgery might try clamping NG tube today.  10/8: Hemodynamically stable, NG tube was removed and patient was started on clear liquid diet, still no BM or flatus, KUB with persistently dilated loops of bowel.  Surgery would like to give her a trial of some p.o. intake before doing Gastrografin challenge.  Surgery later decided to take her to the OR tomorrow morning, failed Gastrografin challenge.  10/9: Vital stable, s/p exploratory laparotomy with reduction of internal hernia and resection of small bowel with primary anastomosis  and adhesion lysis. General surgery wants her to be started on TPN as expecting prolonged ileus, patient recently had a pacemaker in place and cardiology is little hesitant to use a central line.  IR was consulted to avoid pacemaker leads.  10/10: Hemodynamically stable but still no bowel function.  IR was consulted for central line placement because of her recent pacemaker, followed by starting TPN.  Foley catheter was removed to give her a voiding trial.  Patient is very frail, consulting palliative care to clarify goals of care.  Currently full code.  10/16: Vital stable, started getting bowel function and NG tube was removed on 10/13.  Patient was slowly started on clear liquid diet and advance to soft.  Started tapering TPN today.  Patient received 1 unit of PRBC on 10/15 for concern of melena and Eliquis  was held.  IV amiodarone  is being transition to p.o. today.  PT is recommending SNF but patient would like to go home with daughter, maximum home health services ordered.  10/17: Hemodynamically stable, had a bowel movement and tolerating diet.  Worsening leukocytosis so repeat CT abdomen was obtained with pending results, no obvious abscess seen on preliminary.  Surgery would like to keep holding anticoagulation for 2 weeks, cardiology is aware and they will restart as outpatient.  Assessment and Plan: * SBO (small bowel obstruction) (HCC) S/p laparotomy with some bowel resection and adhesion of lysis along with internal hernia reduction on 08/03/2024.  NG tube eventually removed on 10/31 patient was started on diet, currently tolerating soft diet with bowel function. TPN is being tapered off. - Continue supportive care  Coronary artery disease with history of NSTEMI Blood pressure currently borderline soft. - Holding home meds -  Continue metoprolol   Status cardiac pacemaker 07/24/2024 for sinus pause with syncope History of sinus pauses on antiarrhythmics No acute issues  suspected Patient denies chest pain, shortness of breath or lightheadedness Troponin negative. Central line was placed by IR for concern of recent pacemaker leads  Melena Patient developed melena requiring 1 unit of PRBC on 10/15, hemoglobin improved to 8.9 today. - Keep holding Eliquis  - Monitor CBC - Transfuse if below 8  Paroxysmal atrial fibrillation (HCC) Chronic anticoagulation Patient developed A-fib with RVR during current hospitalization and was placed on amiodarone  infusion by cardiology 4 days.  - IV amiodarone  is being switched with p.o. today -Continue with metoprolol  -Eliquis  is being held for concern of melena requiring blood transfusion.  Surgery would like to keep holding for 2 weeks.  Cardiology will restart as outpatient.  HTN (hypertension) Blood pressure currently soft Keep holding home antihypertensives.  Acquired hypothyroidism Holding home Synthroid  while she is n.p.o. Will resume once able to take by mouth   Subjective: Patient was seen and examined today.  Tolerating diet and having bowel movement.  Overall seems improving and asking about going home.  Physical Exam: Vitals:   08/11/24 0500 08/11/24 0508 08/11/24 0811 08/11/24 1417  BP:  (!) 105/59 91/65 101/60  Pulse:  95 92 (!) 103  Resp:  17 18 18   Temp:  (!) 97.5 F (36.4 C) 97.7 F (36.5 C) 97.7 F (36.5 C)  TempSrc:  Oral  Oral  SpO2:  99% 98% 100%  Weight: 76.6 kg     [place]:       General.  Frail and malnourished elderly lady, in no acute distress. Pulmonary.  Lungs clear bilaterally, normal respiratory effort. CV.  Regular rate and rhythm, no JVD, rub or murmur. Abdomen.  Soft, nontender, nondistended, BS positive. CNS.  Alert and oriented .  No focal neurologic deficit. Extremities.  No edema, no cyanosis, pulses intact and symmetrical. Psychiatry.  Judgment and insight appears normal.     Data Reviewed: Prior data reviewed  Family Communication: Discussed with daughter at  bedside  Disposition: Status is: Inpatient Remains inpatient appropriate because: Severity of illness  Planned Discharge Destination: Home  DVT prophylaxis.  Lovenox  Time spent: 50 minutes  This record has been created using Conservation officer, historic buildings. Errors have been sought and corrected,but may not always be located. Such creation errors do not reflect on the standard of care.   Author: Amaryllis Dare, MD 08/11/2024 2:52 PM  For on call review [email or web address].

## 2024-08-11 NOTE — Progress Notes (Addendum)
 [facility] PROGRESS NOTE  Hospital Day(s): 11.   Post op day(s): 8 Days Post-Op.   Interval History:  Patient seen and examined No acute events or new complaints overnight.  Sitting up at side of bed No significant complaints  No fever, chills, nausea, emesis  She did have increasing WBC to 15.0K this morning Hgb to 9.1 Labs otherwise reassuring No longer with bloody bowel movements Soft diet: no issue  Vital signs in last 24 hours: [min-max] current  Temp:  [97.5 F (36.4 C)-97.8 F (36.6 C)] 97.5 F (36.4 C) (10/17 0508) Pulse Rate:  [95-97] 95 (10/17 0508) Resp:  [16-24] 17 (10/17 0508) BP: (92-110)/(49-59) 105/59 (10/17 0508) SpO2:  [99 %-100 %] 99 % (10/17 0508) Weight:  [76.6 kg] 76.6 kg (10/17 0500)     Height: 5' 4 (162.6 cm) Weight: 76.6 kg BMI (Calculated): 28.97   Intake/Output last 2 shifts:  10/16 0701 - 10/17 0700 In: 1251.3 [P.O.:240; I.V.:1011.3] Out: -    Physical Exam:  Constitutional: alert, cooperative and no distress  Respiratory: breathing non-labored at rest  Cardiovascular: regular rate and irregular Gastrointestinal: soft, non-tender, and non-distended. No rebound/guarding Integumentary: Laparotomy is CDI with staples, no erythema, no drainage   Labs:     Latest Ref Rng & Units 08/10/2024    5:35 AM 08/09/2024    4:56 AM 08/08/2024   10:37 AM  CBC  WBC 4.0 - 10.5 K/uL 13.5  8.8    Hemoglobin 12.0 - 15.0 g/dL 9.9  7.8  8.9   Hematocrit 36.0 - 46.0 % 30.0  23.7  27.3   Platelets 150 - 400 K/uL 247  188        Latest Ref Rng & Units 08/11/2024    4:54 AM 08/10/2024    5:35 AM 08/09/2024    4:56 AM  CMP  Glucose 70 - 99 mg/dL 96  893  893   BUN 8 - 23 mg/dL 21  20  23    Creatinine 0.44 - 1.00 mg/dL 9.38  9.42  9.32   Sodium 135 - 145 mmol/L 136  137  136   Potassium 3.5 - 5.1 mmol/L 4.6  4.6  4.5   Chloride 98 - 111 mmol/L 106  105  109   CO2 22 - 32 mmol/L 24  22  21    Calcium  8.9 - 10.3 mg/dL 8.8   9.0  8.6   Total Protein 6.5 - 8.1 g/dL  5.4  4.6   Total Bilirubin 0.0 - 1.2 mg/dL  0.4  0.3   Alkaline Phos 38 - 126 U/L  89  59   AST 15 - 41 U/L  71  53   ALT 0 - 44 U/L  122  79      Imaging studies: No new pertinent imaging studies   Assessment/Plan: 89 y.o. female 8 Days Post-Op s/p exploratory laparotomy, reduction of internal hernia, SBR, and repair of ventral hernia    - Given climbing leukocytosis, we will plan for CT Abdomen/Pelvis today with PO/IV contrast to ensure no missed intra-abdominal process anticipating DC in the next 24-48 hours. She clinically looks good and suspicion for complication/abscess remains low. Daughter in agreement with imaging today.   - I will advance to heart health diet to liberalize her options  - Okay to discontinue TPN today  - Monitor H&H; relatively stable; no signs of further bleeding   - Appreciate cardiology assistance; We will hold Eliquis  for total of 2 weeks given bleed  -  Monitor abdominal examination; on-going bowel function   - Pain control prn; antiemetics prn  - Mobilize; Okay to work with PT   - Further management per primary service; we will follow     - Discharge Planning: We will repeat CT Abdomen/Pelvis today for reassurance given leukocytosis. If this is reassuring, okay to proceed with DC over the weekend. We will follow up next week for staple removal.   All of the above findings and recommendations were discussed with the patient, patient's family (daughter at bedside), and the medical team, and all of patient's and family's questions were answered to their expressed satisfaction.  -- Arthea Platt, PA-C [facility] 08/11/2024, 7:45 AM M-F: 7am - 4pm

## 2024-08-11 NOTE — Progress Notes (Signed)
 OT Cancellation Note  Patient Details Name: Maria Trujillo MRN: [id number] DOB: 21-Sep-1934   Cancelled Treatment:    Reason Eval/Treat Not Completed: Fatigue/lethargy limiting ability to participate;Other (comment). Pt received with daughter present. Pt has been up multiple times already this morning, just got back in bed after using BSC and is requesting to rest. OT will return after lunch as able.  Hansika Leaming L. Ian Cavey, OTR/L  08/11/24, 10:24 AM

## 2024-08-11 NOTE — Plan of Care (Signed)
  Problem: Clinical Measurements: Goal: Will remain free from infection Outcome: Progressing Goal: Diagnostic test results will improve Outcome: Progressing Goal: Respiratory complications will improve Outcome: Progressing   Problem: Activity: Goal: Risk for activity intolerance will decrease Outcome: Progressing   Problem: Nutrition: Goal: Adequate nutrition will be maintained Outcome: Progressing   

## 2024-08-12 ENCOUNTER — Other Ambulatory Visit: Payer: Self-pay

## 2024-08-12 DIAGNOSIS — K921 Melena: Secondary | ICD-10-CM | POA: Diagnosis not present

## 2024-08-12 DIAGNOSIS — N390 Urinary tract infection, site not specified: Secondary | ICD-10-CM | POA: Diagnosis not present

## 2024-08-12 DIAGNOSIS — Z515 Encounter for palliative care: Secondary | ICD-10-CM | POA: Diagnosis not present

## 2024-08-12 DIAGNOSIS — I480 Paroxysmal atrial fibrillation: Secondary | ICD-10-CM | POA: Diagnosis not present

## 2024-08-12 DIAGNOSIS — K56609 Unspecified intestinal obstruction, unspecified as to partial versus complete obstruction: Secondary | ICD-10-CM | POA: Diagnosis not present

## 2024-08-12 DIAGNOSIS — Z950 Presence of cardiac pacemaker: Secondary | ICD-10-CM | POA: Diagnosis not present

## 2024-08-12 LAB — BASIC METABOLIC PANEL WITH GFR
Anion gap: 8 (ref 5–15)
BUN: 20 mg/dL (ref 8–23)
CO2: 26 mmol/L (ref 22–32)
Calcium: 8.9 mg/dL (ref 8.9–10.3)
Chloride: 103 mmol/L (ref 98–111)
Creatinine, Ser: 0.63 mg/dL (ref 0.44–1.00)
GFR, Estimated: >60 mL/min (ref >60)
Glucose, Bld: 72 mg/dL (ref 70–99)
Potassium: 4.1 mmol/L (ref 3.5–5.1)
Sodium: 137 mmol/L (ref 135–145)

## 2024-08-12 LAB — CBC
HCT: 26.7 % — ABNORMAL LOW (ref 36.0–46.0)
Hemoglobin: 8.8 g/dL — ABNORMAL LOW (ref 12.0–15.0)
MCH: 30.7 pg (ref 26.0–34.0)
MCHC: 33 g/dL (ref 30.0–36.0)
MCV: 93 fL (ref 80.0–100.0)
Platelets: 253 K/uL (ref 150–400)
RBC: 2.87 MIL/uL — ABNORMAL LOW (ref 3.87–5.11)
RDW: 14.5 % (ref 11.5–15.5)
WBC: 14.7 K/uL — ABNORMAL HIGH (ref 4.0–10.5)
nRBC: 0 % (ref 0.0–0.2)

## 2024-08-12 LAB — GLUCOSE, CAPILLARY
Glucose-Capillary: 85 mg/dL (ref 70–99)
Glucose-Capillary: 108 mg/dL — ABNORMAL HIGH (ref 70–99)

## 2024-08-12 MED ORDER — FE FUM-VIT C-VIT B12-FA 460-60-0.01-1 MG PO CAPS
1.0000 | ORAL_CAPSULE | Freq: Two times a day (BID) | ORAL | Status: DC
  Administered 2024-08-12: 1 via ORAL
  Filled 2024-08-12: qty 1

## 2024-08-12 MED ORDER — FE FUM-VIT C-VIT B12-FA 460-60-0.01-1 MG PO CAPS
1.0000 | ORAL_CAPSULE | Freq: Two times a day (BID) | ORAL | 0 refills | Status: AC
  Filled 2024-08-12: qty 60, 30d supply, fill #0

## 2024-08-12 MED ORDER — AMIODARONE HCL 200 MG PO TABS
ORAL_TABLET | ORAL | 0 refills | Status: AC
  Filled 2024-08-12 (×2): qty 50, 35d supply, fill #0

## 2024-08-12 MED ORDER — ENSURE PLUS HIGH PROTEIN PO LIQD
237.0000 mL | Freq: Three times a day (TID) | ORAL | 2 refills | Status: AC
  Filled 2024-08-12: qty 20000, 28d supply, fill #0

## 2024-08-12 NOTE — Progress Notes (Signed)
 [place] [facility] PROGRESS NOTE  Hospital Day(s): 12.   Post op day(s): 9 Days Post-Op.   Interval History:  Patient seen and examined No acute events or new complaints overnight.  Sitting up, daughter present in room assisting with discharge prep. No significant complaints  No fever, chills, nausea, emesis  She did have downward trend in  WBC to 14.7K this morning Hgb to 8.8 Labs otherwise reassuring No bloody bowel movements Soft diet: no issue  Vital signs in last 24 hours: [min-max] current  Temp:  [97.7 F (36.5 C)-98.7 F (37.1 C)] 98 F (36.7 C) (10/18 0859) Pulse Rate:  [84-122] 111 (10/18 0859) Resp:  [17-19] 17 (10/18 0859) BP: (80-102)/(50-60) 80/58 (10/18 0859) SpO2:  [95 %-100 %] 99 % (10/18 0821) Weight:  [76.4 kg] 76.4 kg (10/18 0500)     Height: 5' 4 (162.6 cm) Weight: 76.4 kg BMI (Calculated): 28.9   Intake/Output last 2 shifts:  10/17 0701 - 10/18 0700 In: 340 [P.O.:340] Out: 600 [Urine:600]   Physical Exam:  Constitutional: alert, cooperative and no distress  Respiratory: breathing non-labored at rest  Cardiovascular: regular rate and irregular Gastrointestinal: soft, non-tender, and non-distended. No rebound/guarding Integumentary: Laparotomy is CDI with staples, no erythema, no drainage   Labs:     Latest Ref Rng & Units 08/12/2024    5:36 AM 08/11/2024    4:54 AM 08/10/2024    5:35 AM  CBC  WBC 4.0 - 10.5 K/uL 14.7  15.0  13.5   Hemoglobin 12.0 - 15.0 g/dL 8.8  9.1  9.9   Hematocrit 36.0 - 46.0 % 26.7  27.8  30.0   Platelets 150 - 400 K/uL 253  255  247       Latest Ref Rng & Units 08/12/2024    5:36 AM 08/11/2024    4:54 AM 08/10/2024    5:35 AM  CMP  Glucose 70 - 99 mg/dL 72  96  893   BUN 8 - 23 mg/dL 20  21  20    Creatinine 0.44 - 1.00 mg/dL 9.36  9.38  9.42   Sodium 135 - 145 mmol/L 137  136  137   Potassium 3.5 - 5.1 mmol/L 4.1  4.6  4.6   Chloride 98 - 111 mmol/L 103  106  105   CO2 22 - 32 mmol/L 26   24  22    Calcium  8.9 - 10.3 mg/dL 8.9  8.8  9.0   Total Protein 6.5 - 8.1 g/dL   5.4   Total Bilirubin 0.0 - 1.2 mg/dL   0.4   Alkaline Phos 38 - 126 U/L   89   AST 15 - 41 U/L   71   ALT 0 - 44 U/L   122      Imaging studies: CT ABDOMEN AND PELVIS WITH CONTRAST 08/11/2024 01:08:30 PM   TECHNIQUE: CT of the abdomen and pelvis was performed with the administration of 80 mL iohexol  (OMNIPAQUE ) 300 MG/ML solution. Multiplanar reformatted images are provided for review. Automated exposure control, iterative reconstruction, and/or weight-based adjustment of the mA/kV was utilized to reduce the radiation dose to as low as reasonably achievable.   COMPARISON: None available.   CLINICAL HISTORY: Leukocytosis; s/p ex lap and small bowel resection.   FINDINGS:   LOWER CHEST: Cardiac pacemaker. Coronary artery calcifications. Mitral annulus calcifications.   LIVER: The liver is unremarkable.   GALLBLADDER AND BILE DUCTS: Status post cholecystectomy. No biliary ductal dilatation.   SPLEEN: No acute abnormality.  PANCREAS: No acute abnormality.   ADRENAL GLANDS: No acute abnormality.   KIDNEYS, URETERS AND BLADDER: No stones in the kidneys or ureters. No hydronephrosis. No perinephric or periureteral stranding. Urinary bladder is unremarkable.   GI AND BOWEL: Stomach demonstrates no acute abnormality. There is no bowel obstruction. PO contrast reaches the hepatic flexure. No extravasation of oral contrast from the bowel lumen noted. The transverse, descending, and rectosigmoid colon are decompressed with mild bowel wall thickening, colonic fat stranding, and mucosal hyperemia. No large bowel dilatation. Small bowel resection surgical changes are noted within the right lower quadrant. No small bowel wall thickening or dilatation.   PERITONEUM AND RETROPERITONEUM: Possibly organizing fluid within the pelvis in the setting of trace free fluid with mild peritoneal  enhancement (2:71). Small fat-containing and fluid-containing tiny umbilical hernia with an abdominal wall defect of 0.6 cm (2:61). No free air.   VASCULATURE: Aorta is normal in caliber.   LYMPH NODES: No lymphadenopathy.   REPRODUCTIVE ORGANS: The uterus and bilateral adnexal regions are unremarkable.   BONES AND SOFT TISSUES: No acute osseous abnormality.   IMPRESSION: 1. Colitis involving the transverse,  descending, rectosigmoid colon. 2. Trace free fluid with mild peritoneal enhancement and possibly organizing fluid within the pelvis. No definite drainable abscess. 3. Postsurgical changes of small bowel resection in the right lower quadrant.   Electronically signed by: Morgane Naveau MD 08/12/2024 01:34 AM EDT RP Workstation: [id number]   Assessment/Plan: 89 y.o. female 9 Days Post-Op s/p exploratory laparotomy, reduction of internal hernia, SBR, and repair of ventral hernia    - She clinically looks good and suspicion for complication remains low.   - H&H relatively stable; no signs of further bleeding   - Appreciate cardiology assistance; We will hold Eliquis  for total of 2 weeks given bleed  - Further management per primary service; she has outpatient follow-up this coming Wednesday with us . -As CT scan is reassuring, okay to proceed with DC today. We will follow up next week for staple removal.   Honor Leghorn, M.D., [facility]  08/12/2024 ; 9:06 AM

## 2024-08-12 NOTE — TOC Transition Note (Signed)
 Transition of Care [facility]) - Discharge Note   Patient Details  Name: Maria Trujillo MRN: [id number] Date of Birth: 1934-07-05  [facility] CM/SW Contact:  [place] L Malayah Demuro, LCSW Phone Number: 08/12/2024, 11:15 AM   Clinical Narrative:     CSW spoke with Ms. Vicci, daughter of patient. Ms. Vicci advised of discharge. CSW reviewed HHA offers. Ms. Vicci chose [facility]. CSW accepted offer in the portal and confirmed with the liaison, Shaun.   DME was discussed. Ms. Vicci is hesitant in getting a RW for her mother. She advised that the flooring in patient home is a concern. She advised that if she felt that her mother could use a walker she will get her one. Family will provide transportation.   No further TOC needs. TOC signing off.         Patient Goals and CMS Choice            Discharge Placement                       Discharge Plan and Services Additional resources added to the After Visit Summary for                                       Social Drivers of Health (SDOH) Interventions SDOH Screenings   Food Insecurity: No Food Insecurity (07/30/2024)  Housing: Low Risk  (07/30/2024)  Transportation Needs: No Transportation Needs (07/30/2024)  Utilities: Not At Risk (07/30/2024)  Depression (PHQ2-9): Low Risk  (04/29/2021)  Financial Resource Strain: Low Risk  (01/20/2024)   Received from [facility]  Physical Activity: Inactive (11/03/2018)   Received from [facility]  Social Connections: Moderately Isolated (07/30/2024)  Stress: No Stress Concern Present (11/03/2018)   Received from [facility]  Tobacco Use: Low Risk  (08/03/2024)  Health Literacy: Low Risk  (02/03/2021)   Received from [facility]     Readmission Risk Interventions     No data to display

## 2024-08-12 NOTE — Discharge Instructions (Signed)
 [facility] will be providing services in the home. Start of care will be 24 to 48 hours after discharge.

## 2024-08-12 NOTE — Discharge Summary (Signed)
 Physician Discharge Summary   Patient: Maria Trujillo MRN: [id number] DOB: 11-Mar-1934  Admit date:     07/30/2024  Discharge date: 08/12/24  Discharge Physician: Amaryllis Dare   PCP: Maria Cheryl BRAVO, MD   Recommendations at discharge:  Please obtain CBC and CMP on follow-up Follow-up with cardiology Follow-up with general surgery Follow-up with primary care provider  Discharge Diagnoses: Principal Problem:   SBO (small bowel obstruction) (HCC) Active Problems:   Coronary artery disease with history of NSTEMI   Status cardiac pacemaker 07/24/2024 for sinus pause with syncope   Paroxysmal atrial fibrillation (HCC)   Chronic anticoagulation   Melena   HTN (hypertension)   Acquired hypothyroidism   Small bowel obstruction (HCC)   Urinary tract infection without hematuria   Hospital Course: Partly taken from H&P.  Maria Trujillo is a 89 y.o. female with medical history significant for CAD with prior NSTEMI, HTN, HLD, persistent A-fib on Eliquis  and amiodarone , complicated by post conversion  pauses/bradycardia with syncope 04/2024 s/p pacemaker on 07/24/2024 -(Eliquis  held 9/26 to 9/30), being admitted for small bowel obstruction.  She presented with intractable nonbloody, nonbilious, non-coffee-ground vomiting starting on the night prior to arrival associated with lower abdominal pain.   On presentation stable vitals, labs with leukocytosis of 12.4 and UA with some ketones otherwise stable labs.  CT abdomen and pelvis showed SBO with suspected transition point in the right lower quadrant and mesenteric edema with free fluid.  NG tube was placed and general surgery was consulted.  10/6: Vital stable, improving leukocytosis now at 11.3, Significant drainage of about 1700 cc with NG tube. Continuing conservative management and general surgery might do Gastrografin challenge tomorrow if remained unchanged.  10/7: Vital stable, still no bowel movement or flatus, repeat KUB with  persistently dilated loops of bowel with scattered air-fluid levels.  Continuing with conservative management, general surgery might try clamping NG tube today.  10/8: Hemodynamically stable, NG tube was removed and patient was started on clear liquid diet, still no BM or flatus, KUB with persistently dilated loops of bowel.  Surgery would like to give her a trial of some p.o. intake before doing Gastrografin challenge.  Surgery later decided to take her to the OR tomorrow morning, failed Gastrografin challenge.  10/9: Vital stable, s/p exploratory laparotomy with reduction of internal hernia and resection of small bowel with primary anastomosis and adhesion lysis. General surgery wants her to be started on TPN as expecting prolonged ileus, patient recently had a pacemaker in place and cardiology is little hesitant to use a central line.  IR was consulted to avoid pacemaker leads.  10/10: Hemodynamically stable but still no bowel function.  IR was consulted for central line placement because of her recent pacemaker, followed by starting TPN.  Foley catheter was removed to give her a voiding trial.  Patient is very frail, consulting palliative care to clarify goals of care.  Currently full code.  10/16: Vital stable, started getting bowel function and NG tube was removed on 10/13.  Patient was slowly started on clear liquid diet and advance to soft.  Started tapering TPN today.  Patient received 1 unit of PRBC on 10/15 for concern of melena and Eliquis  was held.  IV amiodarone  is being transition to p.o. today.  PT is recommending SNF but patient would like to go home with daughter, maximum home health services ordered.  10/17: Hemodynamically stable, had a bowel movement and tolerating diet.  Worsening leukocytosis so repeat CT abdomen was  obtained with pending results, no obvious abscess seen on preliminary.  Surgery would like to keep holding anticoagulation for 2 weeks, cardiology is aware  and they will restart as outpatient.  10/18: Hemodynamically stable, had a BM, persistent leukocytosis at 14.7.  Repeat abdominal CT yesterday with post operative changes, some free and organizing fluid in pelvis but no discrete abscesses.  Did show some concern of colitis involving the transverse, descending and rectosigmoid colon.  Discussed with general surgery and according to him low concern of any infection so they will hold off to any further antibiotics at this time.  Likely reactive after the surgery.  Patient is tolerating diet and requesting to go home.  Patient has an history of low tolerance of metoprolol  and was feeling funny after taking it so it was held.  Case was also discussed with cardiology.  She will continue with amiodarone  as directed which include loading dose.  We are holding Eliquis  for another 10 days as advised by general surgery.  Her home lisinopril , Lasix  and Imdur  was discontinued due to borderline soft blood pressure and her outpatient provider can restart when appropriate.  Patient will continue on current medications and need to have a close follow-up with her providers for further assistance.  Assessment and Plan: * SBO (small bowel obstruction) (HCC) S/p laparotomy with some bowel resection and adhesion of lysis along with internal hernia reduction on 08/03/2024.  NG tube eventually removed on 10/31 patient was started on diet, currently tolerating soft diet with bowel function. TPN is being tapered off. - Continue supportive care  Coronary artery disease with history of NSTEMI Blood pressure currently borderline soft. - Holding home meds  Status cardiac pacemaker 07/24/2024 for sinus pause with syncope History of sinus pauses on antiarrhythmics No acute issues suspected Patient denies chest pain, shortness of breath or lightheadedness Troponin negative. Central line was placed by IR for concern of recent pacemaker leads  Melena Patient developed  melena requiring 1 unit of PRBC on 10/15, hemoglobin improved to 8.8 today. - Keep holding Eliquis  - Monitor CBC  Paroxysmal atrial fibrillation (HCC) Chronic anticoagulation Patient developed A-fib with RVR during current hospitalization and was placed on amiodarone  infusion by cardiology 4 days.  - IV amiodarone  is being switched with p.o. -Holding metoprolol  due to soft blood pressure -Eliquis  is being held for concern of melena requiring blood transfusion.  Surgery would like to keep holding for 2 weeks.  Cardiology will restart as outpatient.  HTN (hypertension) Blood pressure currently soft Keep holding home antihypertensives.  Acquired hypothyroidism Holding home Synthroid  while she is n.p.o. Will resume once able to take by mouth      Consultants: General surgery.  Cardiology Procedures performed: Laparotomy Disposition: Home health Diet recommendation:  Discharge Diet Orders (From admission, onward)     Start     Ordered   08/12/24 0000  Diet - low sodium heart healthy        08/12/24 1032           Regular diet DISCHARGE MEDICATION: Allergies as of 08/12/2024       Reactions   Celecoxib Hives   Dorzolamide Shortness Of Breath   Other Reaction(s): Other (See Comments) Pt stated had reactions but unable to provide information   Penicillins Other (See Comments), Hives, Rash   Redness (sunburn type rash with peeling) Has patient had a PCN reaction causing immediate rash, facial/tongue/throat swelling, SOB or lightheadedness with hypotension: No Has patient had a PCN reaction causing severe rash  involving mucus membranes or skin necrosis: No Has patient had a PCN reaction that required hospitalization No Has patient had a PCN reaction occurring within the last 10 years: Yes If all of the above answers are NO, then may proceed with Cephalosporin use. Other Reaction(s): Other (See Comments) Redness (sunburn type rash with peeling)  Has patient had a PCN  reaction causing immediate rash, facial/tongue/throat swelling, SOB or lightheadedness with hypotension: No  Has patient had a PCN reaction causing severe rash involving mucus membranes or skin necrosis: No  Has patient had a PCN reaction that required hospitalization No  Has patient had a PCN reaction occurring within the last 10 years: Yes  If all of the above answers are NO, then may proceed with Cephalosporin use.  Redness and flushing  Redness and flushing  Redness (sunburn type rash with peeling)   Shellfish Allergy Hives, Itching   All seafood   Timolol  Maleate Other (See Comments), Shortness Of Breath   Redness Other Reaction(s): Other (See Comments) Shortness of breath  Redness  Redness  Higher Doses  Shortness of breath    Redness   Sulfa Antibiotics Other (See Comments)   Redness in eyes   Amoxicillin Rash   Amoxicillin-pot Clavulanate Other (See Comments)   Redness and flushing   Brimonidine Other (See Comments)   blisters   Cefuroxime Axetil Other (See Comments)   redness   Dipivefrin Other (See Comments)   sleepiness   Oxycodone     BP dropped Other Reaction(s): Other (See Comments) Dropped BP down too low Dropped blood pressure   BP dropped  Dropped BP down too low        Medication List     PAUSE taking these medications    apixaban  5 MG Tabs tablet Wait to take this until your doctor or other care provider tells you to start again. Commonly known as: ELIQUIS  Take 1 tablet (5 mg total) by mouth 2 (two) times daily.   metoprolol  tartrate 25 MG tablet Wait to take this until your doctor or other care provider tells you to start again. Commonly known as: LOPRESSOR  Take 0.5 tablets (12.5 mg total) by mouth daily as needed. for heart rate over 100.  Home med.       STOP taking these medications    folic acid 400 MCG tablet Commonly known as: FOLVITE   furosemide  20 MG tablet Commonly known as: LASIX    isosorbide  mononitrate 30 MG 24 hr  tablet Commonly known as: IMDUR    lisinopril  5 MG tablet Commonly known as: ZESTRIL    vitamin B-12 100 MCG tablet Commonly known as: CYANOCOBALAMIN        TAKE these medications    acetaminophen  650 MG CR tablet Commonly known as: TYLENOL  Take 650 mg by mouth daily as needed for pain.   amiodarone  200 MG tablet Commonly known as: PACERONE  Take 2 tablets (400 mg total) by mouth 2 (two) times daily for 5 days, THEN 1 tablet (200 mg total) daily. Start taking on: August 12, 2024 What changed: See the new instructions.   atorvastatin  40 MG tablet Commonly known as: LIPITOR  Take 40 mg by mouth at bedtime.   azelastine  0.1 % nasal spray Commonly known as: ASTELIN  Place 1 spray into both nostrils daily.   bimatoprost 0.03 % ophthalmic solution Commonly known as: LUMIGAN Place 1 drop into the right eye at bedtime.   Lumigan 0.01 % Soln Generic drug: bimatoprost Place 1 drop into the right eye at bedtime.   Calcium   Carbonate-Vitamin D3 600-400 MG-UNIT Tabs Take 1 tablet by mouth daily.   Docusate Sodium  100 MG capsule Take 100 mg by mouth daily as needed for mild constipation or moderate constipation.   Fe Fum-Vit C-Vit B12-FA Caps capsule Commonly known as: TRIGELS-F FORTE Take 1 capsule by mouth 2 (two) times daily.   feeding supplement Liqd Take 237 mLs by mouth 3 (three) times daily between meals.   fexofenadine 180 MG tablet Commonly known as: ALLEGRA Take 180 mg by mouth at bedtime.   ipratropium 0.06 % nasal spray Commonly known as: ATROVENT Place 1 spray into both nostrils daily as needed for rhinitis.   levothyroxine  100 MCG tablet Commonly known as: SYNTHROID  Take 100 mcg by mouth daily before breakfast.   multivitamin with minerals Tabs tablet Take 1 tablet by mouth daily.   pantoprazole  40 MG tablet Commonly known as: PROTONIX  Take 40 mg by mouth daily.   prednisoLONE acetate 1 % ophthalmic suspension Commonly known as: PRED FORTE Place 1  drop into the right eye 4 (four) times daily.   PRESERVISION AREDS 2 PO Take 2 tablets by mouth 2 (two) times daily.   Theratears 0.25 % Soln Generic drug: Carboxymethylcellulose Sodium Place 1 drop into both eyes daily as needed (Dry eyes).   timolol  0.5 % ophthalmic solution Commonly known as: TIMOPTIC  Place 1 drop into the right eye in the morning.   Vitamin D  50 MCG (2000 UT) tablet Take 2,000 Units by mouth daily.               Discharge Care Instructions  (From admission, onward)           Start     Ordered   08/12/24 0000  Leave dressing on - Keep it clean, dry, and intact until clinic visit        08/12/24 1032            Follow-up Information     Alluri, Keller BROCKS, MD. Go in 1 week(s).   Specialty: Cardiology Contact information: [address] [place] [address] [phone]         Maria Cheryl BRAVO, MD. Schedule an appointment as soon as possible for a visit in 1 week(s).   Specialty: Family Medicine Contact information: [address] [place] [address] [phone]         Jordis Laneta FALCON, MD. Schedule an appointment as soon as possible for a visit in 1 week(s).   Specialty: General Surgery Contact information: [address] [place] [address] [phone]                Discharge Exam: Maria Trujillo   08/10/24 0500 08/11/24 0500 08/12/24 0500  Weight: 74.6 kg 76.6 kg 76.4 kg   General.  Frail and malnourished elderly lady, in no acute distress. Pulmonary.  Lungs clear bilaterally, normal respiratory effort. CV.  Irregularly irregular Abdomen.  Soft, nontender, nondistended, BS positive. CNS.  Alert and oriented .  No focal neurologic deficit. Extremities.  No edema, no cyanosis, pulses intact and symmetrical. Psychiatry.  Judgment and insight appears normal.   Condition at discharge: stable  The results of significant diagnostics from this hospitalization (including imaging,  microbiology, ancillary and laboratory) are listed below for reference.   Imaging Studies: CT ABDOMEN PELVIS W CONTRAST Result Date: 08/12/2024 EXAM: CT ABDOMEN AND PELVIS WITH CONTRAST 08/11/2024 01:08:30 PM TECHNIQUE: CT of the abdomen and pelvis was performed with the administration of 80 mL iohexol  (OMNIPAQUE ) 300 MG/ML  solution. Multiplanar reformatted images are provided for review. Automated exposure control, iterative reconstruction, and/or weight-based adjustment of the mA/kV was utilized to reduce the radiation dose to as low as reasonably achievable. COMPARISON: None available. CLINICAL HISTORY: Leukocytosis; s/p ex lap and small bowel resection. FINDINGS: LOWER CHEST: Cardiac pacemaker. Coronary artery calcifications. Mitral annulus calcifications. LIVER: The liver is unremarkable. GALLBLADDER AND BILE DUCTS: Status post cholecystectomy. No biliary ductal dilatation. SPLEEN: No acute abnormality. PANCREAS: No acute abnormality. ADRENAL GLANDS: No acute abnormality. KIDNEYS, URETERS AND BLADDER: No stones in the kidneys or ureters. No hydronephrosis. No perinephric or periureteral stranding. Urinary bladder is unremarkable. GI AND BOWEL: Stomach demonstrates no acute abnormality. There is no bowel obstruction. PO contrast reaches the hepatic flexure. No extravasation of oral contrast from the bowel lumen noted. The transverse, descending, and rectosigmoid colon are decompressed with mild bowel wall thickening, colonic fat stranding, and mucosal hyperemia. No large bowel dilatation. Small bowel resection surgical changes are noted within the right lower quadrant. No small bowel wall thickening or dilatation. PERITONEUM AND RETROPERITONEUM: Possibly organizing fluid within the pelvis in the setting of trace free fluid with mild peritoneal enhancement (2:71). Small fat-containing and fluid-containing tiny umbilical hernia with an abdominal wall defect of 0.6 cm (2:61). No free air. VASCULATURE: Aorta is  normal in caliber. LYMPH NODES: No lymphadenopathy. REPRODUCTIVE ORGANS: The uterus and bilateral adnexal regions are unremarkable. BONES AND SOFT TISSUES: No acute osseous abnormality. IMPRESSION: 1. Colitis involving the transverse,  descending, rectosigmoid colon. 2. Trace free fluid with mild peritoneal enhancement and possibly organizing fluid within the pelvis. No definite drainable abscess. 3. Postsurgical changes of small bowel resection in the right lower quadrant. Electronically signed by: Morgane Naveau MD 08/12/2024 01:34 AM EDT RP Workstation: [id number]   IR PICC PLACEMENT RIGHT >5 YRS INC IMG GUIDE Result Date: 08/04/2024 INDICATION: 89 year old female with recently placed left subclavian approach cardiac rhythm maintenance device. Unfortunately, she requires TPN and therefore IR is consulted for PICC placement. EXAM: PICC LINE PLACEMENT WITH ULTRASOUND AND FLUOROSCOPIC GUIDANCE MEDICATIONS: None. ANESTHESIA/SEDATION: None. FLUOROSCOPY TIME:  Radiation exposure index: 2.9 mGy, air kerma COMPLICATIONS: SIR Level A - No therapy, no consequence. Inadvertent arterial puncture by the APP initially performing the procedure. The complication was recognized immediately and pressure held for 25 minutes. No evidence of hematoma. Physician then took over and completed placement of PICC. PROCEDURE: The patient was advised of the possible risks and complications and agreed to undergo the procedure. The patient was then brought to the angiographic suite for the procedure. The right arm was prepped with chlorhexidine , draped in the usual sterile fashion using maximum barrier technique (cap and mask, sterile gown, sterile gloves, large sterile sheet, hand hygiene and cutaneous antisepsis) and infiltrated locally with 1% Lidocaine . Ultrasound demonstrated patency of the right brachial vein, and this was documented with an image. Under real-time ultrasound guidance, this vein was accessed with a 21 gauge  micropuncture needle and image documentation was performed. A 0.018 wire was introduced in to the vein. Over this, a 5 [place] dual lumen power-injectable PICC was advanced to the lower SVC. Fluoroscopy during the procedure and fluoro spot radiograph confirms appropriate catheter position. The catheter was flushed and covered with a sterile dressing. Catheter length: 32 cm IMPRESSION: Successful right arm Power PICC line placement with ultrasound and fluoroscopic guidance. The catheter is ready for use. Electronically Signed   By: Wilkie Lent M.D.   On: 08/04/2024 16:44   US  EKG SITE RITE Result Date: 08/03/2024  If [employer] not attached, placement could not be confirmed due to current cardiac rhythm.  DG Abd 1 View Result Date: 08/02/2024 CLINICAL DATA:  Nasogastric tube placement EXAM: ABDOMEN - 1 VIEW COMPARISON:  None Available. FINDINGS: No nasogastric tube is identified within visualized lung bases and upper abdomen. Dilated fluid-filled loops of bowel noted on prior CT examination is not well appreciated on this exam. No free intraperitoneal gas. Pacemaker in place. IMPRESSION: 1. No nasogastric tube identified. Correlate for malposition within the oropharynx is recommended. Electronically Signed   By: Dorethia Molt M.D.   On: 08/02/2024 23:33   CT ABDOMEN PELVIS W CONTRAST Result Date: 08/02/2024 EXAM: CT ABDOMEN AND PELVIS WITH CONTRAST 08/02/2024 07:14:10 PM TECHNIQUE: CT of the abdomen and pelvis was performed with the administration of intravenous contrast. Multiplanar reformatted images are provided for review. Automated exposure control, iterative reconstruction, and/or weight-based adjustment of the mA/kV was utilized to reduce the radiation dose to as low as reasonably achievable. COMPARISON: None available. CLINICAL HISTORY: Abdominal pain, acute, nonlocalized. 89 y.o. female with medical history significant for CAD with prior NSTEMI, HTN, HLD, persistent A-fib on Eliquis  and  amiodarone , complicated by post conversion pauses/bradycardia with syncope 04/2024 s/p pacemaker on 07/24/2024 -(Eliquis  held 9/26 to 9/30), being admitted for small bowel obstruction. She presented with intractable nonbloody, nonbilious, non-coffee-ground vomiting starting on the night prior to arrival associated with lower abdominal pain. FINDINGS: LOWER CHEST: Pacemaker leads within the right heart. Cardiac size within normal limits. Moderate coronary artery calcification. LIVER: The liver is unremarkable. GALLBLADDER AND BILE DUCTS: Status post cholecystectomy. SPLEEN: No acute abnormality. PANCREAS: No acute abnormality. ADRENAL GLANDS: No acute abnormality. KIDNEYS, URETERS AND BLADDER: No stones in the kidneys or ureters. No hydronephrosis. No perinephric or periureteral stranding. Urinary bladder is unremarkable. GI AND BOWEL: There was a complex small bowel obstruction with 2 points of transitioning close proximity seen within the right hemipelvis on axial image 63, series 2 and coronal image 45 series 5. The proximal small bowel is dilated and fluid-filled. However, the intervening small bowel within the pelvis appears not only dilated and fluid-filled but demonstrates extensive mesenteric edema in keeping with a closed loop obstruction of this segment. The terminal small bowel is decompressed. The colon is unremarkable. The stomach is unremarkable. PERITONEUM AND RETROPERITONEUM: Trace free fluid within the pelvis. No free intraperitoneal gas. No ascites. VASCULATURE: Moderate aortoiliac atherosclerotic calcification. No aortic aneurysm. Aorta is normal in caliber. LYMPH NODES: No lymphadenopathy. REPRODUCTIVE ORGANS: Uterus absent. No adnexal masses. BONES AND SOFT TISSUES: Osseous structures are age appropriate. No acute bone abnormality. No lytic or blastic bone lesion. IMPRESSION: 1. Complex small bowel obstruction with 2 points of obstruction in close proximity likely related to an underlying adhesion  within the right hemipelvis. Resultant closed loop obstruction of the intervening small bowel in the right hemipelvis, evidenced by dilated, fluid-filled intervening small bowel with extensive mesenteric edema. No free intraperitoneal gas. 2. Trace free fluid within the pelvis. 3. These results will be called to the ordering clinician or representative by the radiologist assistant, and communication documented in the pacs or clario dashboard. Electronically signed by: Dorethia Molt MD 08/02/2024 07:40 PM EDT RP Workstation: [id number]   DG ABD ACUTE 2+V W 1V CHEST Result Date: 08/02/2024 CLINICAL DATA:  Left hand small bowel obstruction EXAM: DG ABDOMEN ACUTE WITH 1 VIEW CHEST COMPARISON:  August 01, 2024 FINDINGS: Interval removal of enteric tube. Similar appearance of dilated loops of small bowel in the upper abdomen  with persistent air-fluid levels again identified. No free air. Cholecystectomy clips. Unchanged cardiac pacemaker.  No acute osseous findings. IMPRESSION: Interval removal of enteric tube. Similar appearance of dilated loop of small bowel in the upper abdomen with persistent air-fluid levels. Electronically Signed   By: Michaeline Blanch M.D.   On: 08/02/2024 11:23   DG ABD ACUTE 2+V W 1V CHEST Result Date: 08/01/2024 CLINICAL DATA:  Small bowel obstruction EXAM: DG ABDOMEN ACUTE WITH 1 VIEW CHEST COMPARISON:  July 30, 2024 FINDINGS: Unchanged enteric tube with tip in side hole projecting over the stomach. Scattered air-fluid levels with a few dilated loops of small bowel again identified in the upper abdomen, although there is paucity of visualized bowel gas limiting evaluation somewhat. No free air. No focal consolidations. No pleural effusions or pneumothorax. Left chest wall pacemaker. IMPRESSION: Enteric tube in place. Paucity of visualized bowel gas, limiting evaluation, although there appears to be persistent dilated loop of bowel in the upper abdomen with scattered air-fluid levels again  identified. Electronically Signed   By: Michaeline Blanch M.D.   On: 08/01/2024 10:57   DG Abd 1 View Result Date: 07/30/2024 CLINICAL DATA:  Nasogastric tube placement. EXAM: ABDOMEN - 1 VIEW COMPARISON:  CT earlier today FINDINGS: Tip and side port of the enteric tube below the diaphragm in the stomach. Excreted IV contrast in the renal collecting systems from prior CT. Dilated small bowel in CT is fluid-filled and not well demonstrated by radiograph. IMPRESSION: Tip and side port of the enteric tube below the diaphragm in the stomach. Electronically Signed   By: Andrea Gasman M.D.   On: 07/30/2024 22:48   CT ABDOMEN PELVIS W CONTRAST Result Date: 07/30/2024 CLINICAL DATA:  Abdominal pain. Intractable vomiting. Recent pacemaker placement. Prior cholecystectomy. EXAM: CT ABDOMEN AND PELVIS WITH CONTRAST TECHNIQUE: Multidetector CT imaging of the abdomen and pelvis was performed using the standard protocol following bolus administration of intravenous contrast. RADIATION DOSE REDUCTION: This exam was performed according to the departmental dose-optimization program which includes automated exposure control, adjustment of the mA and/or kV according to patient size and/or use of iterative reconstruction technique. CONTRAST:  OMNIPAQUE  IOHEXOL  300 MG/ML  SOLN COMPARISON:  Noncontrast CT 12/11/2014 FINDINGS: Lower chest: Pacemaker leads overlie the right atrium and ventricle. No pericardial effusion. No basilar airspace disease or pleural effusion. Coronary artery calcifications. Hepatobiliary: No focal liver abnormality. Cholecystectomy. Mild central intrahepatic biliary ductal dilatation. No common bile duct dilatation. Pancreas: No ductal dilatation or inflammation. Spleen: Normal in size without focal abnormality. Adrenals/Urinary Tract: No adrenal nodule. No hydronephrosis. No renal calculi. No suspicious renal abnormality. Partially distended urinary bladder, normal for degree of distension.  Stomach/Bowel: Dilated fluid-filled stomach. Dilated fluid-filled small bowel. Suspected transition point in the right lower quadrant, series 2, image 61, the distal small bowel is decompressed. There is mesenteric edema and free fluid. No bowel pneumatosis. Small to moderate volume of stool in the colon. No colonic inflammation. Normal appendix is potentially visualized. Vascular/Lymphatic: Aortic atherosclerosis and tortuosity. No aortic aneurysm. Patent portal vein. No portal venous or mesenteric gas. No suspicious lymphadenopathy. Reproductive: Normal for age. Other: Generalized mesenteric edema. Free fluid in the mesentery, abdomen and pelvis. No free air or focal fluid collection. Musculoskeletal: Multilevel degenerative change in the spine. There are no acute or suspicious osseous abnormalities. IMPRESSION: 1. Small bowel obstruction with suspected transition point in the right lower quadrant. Mesenteric edema and free fluid. 2. Aortic Atherosclerosis (ICD10-I70.0). Electronically Signed   By: Andrea Gasman  M.D.   On: 07/30/2024 20:27   DG Chest 2 View Result Date: 07/24/2024 CLINICAL DATA:  Pacemaker. EXAM: CHEST - 2 VIEW COMPARISON:  Radiograph 07/05/2024 FINDINGS: Dual lead left-sided pacemaker with lead tips projecting over the right atrium and ventricle.The cardiomediastinal contours are normal. Aortic atherosclerosis. Unchanged elevation of right hemidiaphragm. Pulmonary vasculature is normal. No consolidation, pleural effusion, or pneumothorax. Chronic distal left clavicle deformity. No acute osseous abnormalities are seen. IMPRESSION: Dual lead left-sided pacemaker with lead tips projecting over the right atrium and ventricle. No pneumothorax. Electronically Signed   By: Andrea Gasman M.D.   On: 07/24/2024 18:38   EP PPM/ICD IMPLANT Result Date: 07/24/2024  CONCLUSIONS:  1. Symptomatic bradycardia due to tachycardia-bradycardia syndrome  2. Successful dual chamber permanent pacemaker  implantation  3.  No early apparent complications.  4. Hold Eliquis  and Aspirin  for 5 days (OK to restart July 30, 2024)    Microbiology: Results for orders placed or performed during the hospital encounter of 07/30/24  Resp panel by RT-PCR (RSV, Flu A&B, Covid) Anterior Nasal Swab     Status: None   Collection Time: 07/30/24  7:08 PM   Specimen: Anterior Nasal Swab  Result Value Ref Range Status   SARS Coronavirus 2 by RT PCR NEGATIVE NEGATIVE Final    Comment: (NOTE) SARS-CoV-2 target nucleic acids are NOT DETECTED.  The SARS-CoV-2 RNA is generally detectable in upper respiratory specimens during the acute phase of infection. The lowest concentration of SARS-CoV-2 viral copies this assay can detect is 138 copies/mL. A negative result does not preclude SARS-Cov-2 infection and should not be used as the sole basis for treatment or other patient management decisions. A negative result may occur with  improper specimen collection/handling, submission of specimen other than nasopharyngeal swab, presence of viral mutation(s) within the areas targeted by this assay, and inadequate number of viral copies(<138 copies/mL). A negative result must be combined with clinical observations, patient history, and epidemiological information. The expected result is Negative.  Fact Sheet for Patients:  BloggerCourse.com  Fact Sheet for Healthcare Providers:  SeriousBroker.it  This test is no t yet approved or cleared by the [place]  FDA and  has been authorized for detection and/or diagnosis of SARS-CoV-2 by FDA under an Emergency Use Authorization (EUA). This EUA will remain  in effect (meaning this test can be used) for the duration of the COVID-19 declaration under Section 564(b)(1) of the Act, 21 U.S.C.section 360bbb-3(b)(1), unless the authorization is terminated  or revoked sooner.       Influenza A by PCR NEGATIVE NEGATIVE Final    Influenza B by PCR NEGATIVE NEGATIVE Final    Comment: (NOTE) The Xpert Xpress SARS-CoV-2/FLU/RSV plus assay is intended as an aid in the diagnosis of influenza from Nasopharyngeal swab specimens and should not be used as a sole basis for treatment. Nasal washings and aspirates are unacceptable for Xpert Xpress SARS-CoV-2/FLU/RSV testing.  Fact Sheet for Patients: BloggerCourse.com  Fact Sheet for Healthcare Providers: SeriousBroker.it  This test is not yet approved or cleared by the [place]  FDA and has been authorized for detection and/or diagnosis of SARS-CoV-2 by FDA under an Emergency Use Authorization (EUA). This EUA will remain in effect (meaning this test can be used) for the duration of the COVID-19 declaration under Section 564(b)(1) of the Act, 21 U.S.C. section 360bbb-3(b)(1), unless the authorization is terminated or revoked.     Resp Syncytial Virus by PCR NEGATIVE NEGATIVE Final    Comment: (NOTE) Fact Sheet for Patients:  BloggerCourse.com  Fact Sheet for Healthcare Providers: SeriousBroker.it  This test is not yet approved or cleared by the [place]  FDA and has been authorized for detection and/or diagnosis of SARS-CoV-2 by FDA under an Emergency Use Authorization (EUA). This EUA will remain in effect (meaning this test can be used) for the duration of the COVID-19 declaration under Section 564(b)(1) of the Act, 21 U.S.C. section 360bbb-3(b)(1), unless the authorization is terminated or revoked.  Performed at [facility], [address] [place], [place] [address]     Labs: CBC: Recent Labs  Lab 08/08/24 [id number] 08/08/24 1037 08/09/24 0456 08/10/24 0535 08/11/24 0454 08/12/24 0536  WBC 12.8*  --  8.8 13.5* 15.0* 14.7*  HGB 10.1* 8.9* 7.8* 9.9* 9.1* 8.8*  HCT 30.6* 27.3* 23.7* 30.0* 27.8* 26.7*  MCV 94.2  --  93.7 92.6 93.3 93.0   PLT 197  --  188 247 255 253   Basic Metabolic Panel: Recent Labs  Lab 08/06/24 0134 08/06/24 0137 08/07/24 0519 08/08/24 0441 08/09/24 0456 08/10/24 0535 08/11/24 0454 08/12/24 0536  NA 134*  --  139 137 136 137 136 137  K 4.1  --  4.1 4.3 4.5 4.6 4.6 4.1  CL 104  --  107 109 109 105 106 103  CO2 21*  --  23 21* 21* 22 24 26   GLUCOSE 105*  --  117* 131* 106* 106* 96 72  BUN 27*  --  30* 34* 23 20 21 20   CREATININE 0.62  --  0.64 0.59 0.67 0.57 0.61 0.63  CALCIUM  8.6*  --  8.7* 9.3 8.6* 9.0 8.8* 8.9  MG  --  2.3 2.2  --  1.9 2.3 2.0  --   PHOS 2.9  --  2.6  --  3.0 2.9 3.7  --    Liver Function Tests: Recent Labs  Lab 08/06/24 0134 08/07/24 0519 08/09/24 0456 08/10/24 0535  AST  --  28 53* 71*  ALT  --  28 79* 122*  ALKPHOS  --  55 59 89  BILITOT  --  0.5 0.3 0.4  PROT  --  4.8* 4.6* 5.4*  ALBUMIN 2.5* 2.1* 2.0* 2.5*   CBG: Recent Labs  Lab 08/10/24 1214 08/10/24 1644 08/11/24 0813 08/11/24 1640 08/12/24 0735  GLUCAP 112* 114* 99 111* 85    Discharge time spent: greater than 30 minutes.  This record has been created using Conservation officer, historic buildings. Errors have been sought and corrected,but may not always be located. Such creation errors do not reflect on the standard of care.   Signed: Amaryllis Dare, MD [facility] 08/12/2024

## 2024-08-12 NOTE — Progress Notes (Signed)
 Daily Progress Note   Patient Name: Maria Trujillo      Date: 08/12/2024 DOB: 11-Jan-1934  Age: 89 y.o. MRN#: [id number] Attending Physician: No att. providers found Primary Care Physician: Maria Cheryl BRAVO, MD Admit Date: 07/30/2024  Reason for Consultation/Follow-up: Establishing goals of care  HPI/Brief Hospital Review: 89 y.o. female  with past medical history of CAD, history of NSTEMI, hypertension, hyperlipidemia, A-fib on Eliquis  and amiodarone , complications status post cardioversion with sinus pauses and significant bradycardia with syncope in July 2025 now s/p PPM on 07/24/2024 admitted from home on 07/30/2024 with significant abdominal pain with associated nausea and vomiting.   In ED found to have small bowel obstruction with suspected transition point in the right lower quadrant and mesenteric edema and free fluid   Admitted for small bowel obstruction, general surgery consulted, NG tube placed to suction, s/p exploratory lap with small bowel resection and hernia repair on 10/9 NG tube remains and has been started on TPN via PICC line  Concern over the week due to leukocytosis, repeat CT abdomen reassuring per general surgery, leukocytosis slightly improved 10/18 at 14.7   Palliative medicine was consulted for assisting with goals of care conversations.  Subjective: Extensive chart review has been completed prior to meeting patient including labs, vital signs, imaging, progress notes, orders, and available advanced directive documents from current and previous encounters.    Visited with Ms. Maria Trujillo at her bedside. Daughter at bedside assisting with Ms. Maria Trujillo on toilet and changing clothes. Per daughter, plan for discharge home today. Ms. Maria Trujillo reports feeling well today without acute  complaints and is eager to return home.  Passed along daughters questions regarding PICC line to nurse--likely to be removed prior to d/c. Per TOC notes, support in the home has been arranged.   Objective:  Physical Exam Constitutional:      General: She is not in acute distress.    Appearance: She is not ill-appearing.  HENT:     Head: Normocephalic.  Pulmonary:     Effort: Pulmonary effort is normal. No respiratory distress.  Neurological:     Mental Status: She is alert and oriented to person, place, and time.             Vital Signs: BP 92/63 (BP Location: Left Arm)   Pulse 80  Temp 97.6 F (36.4 C) (Axillary)   Resp 18   Ht 5' 4 (1.626 m)   Wt 76.4 kg   SpO2 100%   BMI 28.91 kg/m  SpO2: SpO2: 100 % O2 Device: O2 Device: Room Air O2 Flow Rate: O2 Flow Rate (L/min): 2.5 L/min   Palliative Care Assessment & Plan   Assessment/Recommendation/Plan  Plan to d/c home today  Thank you for allowing the Palliative Medicine Team to assist in the care of this patient.  Visit includes: Detailed review of medical records (labs, imaging, vital signs), medically appropriate exam (mental status, respiratory, cardiac, skin), discussed with treatment team, counseling and educating patient, family and staff, documenting clinical information, medication management and coordination of care.  Maria Lesches, DNP, AGNP-C Palliative Medicine   Please contact Palliative Medicine Team phone at [phone] for questions and concerns.

## 2024-08-12 NOTE — Plan of Care (Signed)

## 2024-08-13 NOTE — Progress Notes (Signed)
 Removed PICC line intact. Held pressure for 30 mins. Site without problems

## 2024-08-16 ENCOUNTER — Encounter: Admitting: Physician Assistant

## 2024-08-17 ENCOUNTER — Encounter: Payer: Self-pay | Admitting: Physician Assistant

## 2024-08-17 ENCOUNTER — Ambulatory Visit: Admitting: Physician Assistant

## 2024-08-17 VITALS — BP 113/62 | HR 76 | Ht 64.0 in | Wt 161.0 lb

## 2024-08-17 DIAGNOSIS — K219 Gastro-esophageal reflux disease without esophagitis: Secondary | ICD-10-CM | POA: Diagnosis not present

## 2024-08-17 DIAGNOSIS — R1084 Generalized abdominal pain: Secondary | ICD-10-CM | POA: Diagnosis not present

## 2024-08-17 DIAGNOSIS — K56609 Unspecified intestinal obstruction, unspecified as to partial versus complete obstruction: Secondary | ICD-10-CM

## 2024-08-17 DIAGNOSIS — I4891 Unspecified atrial fibrillation: Secondary | ICD-10-CM | POA: Diagnosis not present

## 2024-08-17 DIAGNOSIS — I10 Essential (primary) hypertension: Secondary | ICD-10-CM | POA: Diagnosis not present

## 2024-08-17 DIAGNOSIS — Z09 Encounter for follow-up examination after completed treatment for conditions other than malignant neoplasm: Secondary | ICD-10-CM

## 2024-08-17 DIAGNOSIS — Z8719 Personal history of other diseases of the digestive system: Secondary | ICD-10-CM | POA: Diagnosis not present

## 2024-08-17 DIAGNOSIS — Z1331 Encounter for screening for depression: Secondary | ICD-10-CM | POA: Diagnosis not present

## 2024-08-17 NOTE — Progress Notes (Signed)
 [facility] POST-OP OFFICE VISIT  08/17/2024  HPI: Maria Trujillo is a 89 y.o. female 14 days s/p exploratory laparotomy, reduction of internal hernia, SBR, and repair of ventral hernia   She is doing well given the circumstance and surgery No significant abdominal pain; soreness with movements No fever, chills, nausea, emesis She is tolerating PO without issue; Good appetite Normal bowel function - no blood  She has continued to hold Eliquis  as instructed No other complaints   Vital signs: BP 113/62   Pulse 76   Ht 5' 4 (1.626 m)   Wt 161 lb (73 kg)   SpO2 100%   BMI 27.64 kg/m    Physical Exam: Constitutional: Well appearing female, NAD Abdomen: Soft, non-tender, non-distended, no rebound/guarding Skin: Laparotomy is healing well with staples, very scant serous drainage to superior corner, no erythema, healing ecchymosis   Assessment/Plan: This is a 89 y.o. female 14 days s/p exploratory laparotomy, reduction of internal hernia, SBR, and repair of ventral hernia    - Staples removed without issue; Steri-strips place  - She may resume Eliquis  tomorrow (10/24)  - Pain control prn  - Reviewed wound care recommendation  - Reviewed lifting restrictions; 6 weeks total  - I will see her again in ~2 weeks for recheck; She, and her daughter, understand to call with questions/concerns in the interim   -- Arthea Platt, PA-C [facility] 08/17/2024, 11:23 AM M-F: 7am - 4pm

## 2024-08-21 ENCOUNTER — Ambulatory Visit: Admitting: Cardiology

## 2024-08-21 DIAGNOSIS — R1084 Generalized abdominal pain: Secondary | ICD-10-CM | POA: Diagnosis not present

## 2024-08-22 ENCOUNTER — Ambulatory Visit: Attending: Cardiology | Admitting: Cardiology

## 2024-08-22 DIAGNOSIS — I4819 Other persistent atrial fibrillation: Secondary | ICD-10-CM | POA: Diagnosis not present

## 2024-08-22 DIAGNOSIS — I495 Sick sinus syndrome: Secondary | ICD-10-CM | POA: Diagnosis not present

## 2024-08-22 DIAGNOSIS — D6869 Other thrombophilia: Secondary | ICD-10-CM | POA: Diagnosis not present

## 2024-08-22 LAB — CUP PACEART INCLINIC DEVICE CHECK
Date Time Interrogation Session: 20251028113939
Implantable Lead Connection Status: 753985
Implantable Lead Connection Status: 753985
Implantable Lead Implant Date: 20250929
Implantable Lead Implant Date: 20250929
Implantable Lead Location: 753859
Implantable Lead Location: 753860
Implantable Lead Model: 7840
Implantable Lead Model: 7841
Implantable Lead Serial Number: 1124235
Implantable Lead Serial Number: 1652463
Implantable Pulse Generator Implant Date: 20250929
Lead Channel Impedance Value: 484 Ohm
Lead Channel Impedance Value: 590 Ohm
Lead Channel Pacing Threshold Amplitude: 0.7 V
Lead Channel Pacing Threshold Amplitude: 1.1 V
Lead Channel Pacing Threshold Pulse Width: 0.4 ms
Lead Channel Pacing Threshold Pulse Width: 0.4 ms
Lead Channel Sensing Intrinsic Amplitude: 12.8 mV
Lead Channel Sensing Intrinsic Amplitude: 2.7 mV
Lead Channel Setting Pacing Amplitude: 3.5 V
Lead Channel Setting Pacing Amplitude: 3.5 V
Lead Channel Setting Pacing Pulse Width: 0.4 ms
Lead Channel Setting Sensing Sensitivity: 2.5 mV
Pulse Gen Serial Number: 193698
Zone Setting Status: 755011

## 2024-08-22 NOTE — Patient Instructions (Signed)
 Medication Instructions:  Your physician recommends that you continue on your current medications as directed. Please refer to the Current Medication list given to you today.  *If you need a refill on your cardiac medications before your next appointment, please call your pharmacy*  Lab Work: No labs ordered today  If you have labs (blood work) drawn today and your tests are completely normal, you will receive your results only by: MyChart Message (if you have MyChart) OR A paper copy in the mail If you have any lab test that is abnormal or we need to change your treatment, we will call you to review the results.  Testing/Procedures: No test ordered today   Follow-Up: At [facility], you and your health needs are our priority.  As part of our continuing mission to provide you with exceptional heart care, our providers are all part of one team.  This team includes your primary Cardiologist (physician) and Advanced Practice Providers or APPs (Physician Assistants and Nurse Practitioners) who all work together to provide you with the care you need, when you need it.  Your next appointment:    10/24/24 @ 1:30 pm with Suzann Riddle, NP  We recommend signing up for the patient portal called MyChart.  Sign up information is provided on this After Visit Summary.  MyChart is used to connect with patients for Virtual Visits (Telemedicine).  Patients are able to view lab/test results, encounter notes, upcoming appointments, etc.  Non-urgent messages can be sent to your provider as well.   To learn more about what you can do with MyChart, go to [email or web address].   Other Instructions Wound Care:       OK to shower Let warm soapy water run down incision Do not scrub incision Pat dry with towel  Keep incision open to air - no ointments, creams, salves, or bandages.  Call device clinic if incision opens, has drainage, or begins to hurt more.

## 2024-08-22 NOTE — Progress Notes (Signed)
 Wound check appointment.  Steri-strips previously removed. Wound without redness or edema. Incision edges approximated, wound well healed. Normal device function. Thresholds, sensing, and impedances consistent with implant measurements. Device programmed at 3.5V programmed on for extra safety margin until 3 month visit. Histogram distribution appropriate for patient and level of activity. 65% AF burden, two high ventricular rates noted - appear AF w RVR. These episodes correspond to patient's recent hospitalization w SBO.  Patient educated about wound care, arm mobility, lifting restrictions. ROV in 3 months with implanting physician or APP

## 2024-08-28 DIAGNOSIS — I2510 Atherosclerotic heart disease of native coronary artery without angina pectoris: Secondary | ICD-10-CM | POA: Diagnosis not present

## 2024-08-28 DIAGNOSIS — Z950 Presence of cardiac pacemaker: Secondary | ICD-10-CM | POA: Diagnosis not present

## 2024-08-28 DIAGNOSIS — E7800 Pure hypercholesterolemia, unspecified: Secondary | ICD-10-CM | POA: Diagnosis not present

## 2024-08-28 DIAGNOSIS — I4819 Other persistent atrial fibrillation: Secondary | ICD-10-CM | POA: Diagnosis not present

## 2024-08-28 DIAGNOSIS — I10 Essential (primary) hypertension: Secondary | ICD-10-CM | POA: Diagnosis not present

## 2024-08-29 DIAGNOSIS — D649 Anemia, unspecified: Secondary | ICD-10-CM | POA: Diagnosis not present

## 2024-08-30 ENCOUNTER — Encounter: Payer: Self-pay | Admitting: Physician Assistant

## 2024-08-30 ENCOUNTER — Ambulatory Visit: Admitting: Physician Assistant

## 2024-08-30 VITALS — BP 130/73 | HR 71 | Ht 64.0 in | Wt 174.0 lb

## 2024-08-30 DIAGNOSIS — K56609 Unspecified intestinal obstruction, unspecified as to partial versus complete obstruction: Secondary | ICD-10-CM

## 2024-08-30 DIAGNOSIS — Z09 Encounter for follow-up examination after completed treatment for conditions other than malignant neoplasm: Secondary | ICD-10-CM

## 2024-08-30 NOTE — Patient Instructions (Addendum)
 We will see you back in 3 months, our schedule is not yet available, so we placed you in our recall system and will send you a letter with appointment information     GENERAL POST-OPERATIVE PATIENT INSTRUCTIONS   WOUND CARE INSTRUCTIONS:  Keep a dry clean dressing on the wound if there is drainage. The initial bandage may be removed after 24 hours.  Once the wound has quit draining you may leave it open to air.  If clothing rubs against the wound or causes irritation and the wound is not draining you may cover it with a dry dressing during the daytime.  Try to keep the wound dry and avoid ointments on the wound unless directed to do so.  If the wound becomes bright red and painful or starts to drain infected material that is not clear, please contact your physician immediately.  If the wound is mildly pink and has a thick firm ridge underneath it, this is normal, and is referred to as a healing ridge.  This will resolve over the next 4-6 weeks.  BATHING: You may shower if you have been informed of this by your surgeon. However, Please do not submerge in a tub, hot tub, or pool until incisions are completely sealed or have been told by your surgeon that you may do so.  DIET:  You may eat any foods that you can tolerate.  It is a good idea to eat a high fiber diet and take in plenty of fluids to prevent constipation.  If you do become constipated you may want to take a mild laxative or take ducolax tablets on a daily basis until your bowel habits are regular.  Constipation can be very uncomfortable, along with straining, after recent surgery.  ACTIVITY:  You are encouraged to cough and deep breath or use your incentive spirometer if you were given one, every 15-30 minutes when awake.  This will help prevent respiratory complications and low grade fevers post-operatively if you had a general anesthetic.  You may want to hug a pillow when coughing and sneezing to add additional support to the surgical  area, if you had abdominal or chest surgery, which will decrease pain during these times.  You are encouraged to walk and engage in light activity for the next two weeks.  You should not lift more than 20 pounds for 6 weeks total after surgery as it could put you at increased risk for complications.  Twenty pounds is roughly equivalent to a plastic bag of groceries. At that time- Listen to your body when lifting, if you have pain when lifting, stop and then try again in a few days. Soreness after doing exercises or activities of daily living is normal as you get back in to your normal routine.  MEDICATIONS:  Try to take narcotic medications and anti-inflammatory medications, such as tylenol , ibuprofen, naprosyn, etc., with food.  This will minimize stomach upset from the medication.  Should you develop nausea and vomiting from the pain medication, or develop a rash, please discontinue the medication and contact your physician.  You should not drive, make important decisions, or operate machinery when taking narcotic pain medication.  SUNBLOCK Use sun block to incision area over the next year if this area will be exposed to sun. This helps decrease scarring and will allow you avoid a permanent darkened area over your incision.  QUESTIONS:  Please feel free to call our office if you have any questions, and we will be glad  to assist you. [phone]

## 2024-08-30 NOTE — Progress Notes (Signed)
 [facility] POST-OP OFFICE VISIT  08/30/2024  HPI: Maria Trujillo is a 89 y.o. female s/p exploratory laparotomy, reduction of internal hernia, SBR, and repair of ventral hernia on 10/09  She continues to do well No abdominal pain No fever, chills, nausea, emesis She is tolerating PO; good appetite Some constipation with iron supplementation; improved with Miralax  Incision doing well Ambulatory Back on Eliquis  No other complaints  Vital signs: BP 130/73   Pulse 71   Ht 5' 4 (1.626 m)   Wt 174 lb (78.9 kg)   SpO2 97%   BMI 29.87 kg/m    Physical Exam: Constitutional: Well appearing female, NAD Abdomen: Soft, non-tender, non-distended, no rebound/guarding Skin: Laparotomy is healing well, no erythema or drainage   Assessment/Plan: This is a 89 y.o. female s/p exploratory laparotomy, reduction of internal hernia, SBR, and repair of ventral hernia on 10/09   - Okay to continue Eliquis   - Pain control prn; doing well   - Reviewed wound care recommendation  - Reviewed lifting restrictions; 6 weeks total  - We will follow up in ~3 months; She, and her daughter,  understand to call with questions/concerns in the interim  -- Arthea Platt, PA-C [facility] 08/30/2024, 2:28 PM M-F: 7am - 4pm

## 2024-09-01 NOTE — Progress Notes (Signed)
 CCM time for [facility] orders: 20 min's   08/28/24 Leodis Sawyer, RMA TL   08/28/24  2:09 PM Unsigned Note [facility] orders for OT on desk for signing- order number [id number]- 1 page Ccm- 5 minutes    Leodis Sawyer, RMA TL   08/28/24  4:24 PM Unsigned Note [facility] report for signing off on- visit date 08/18/24   Ccm- 5 minutes    Leodis Sawyer, RMA TL   08/28/24  4:48 PM Unsigned Note [facility] change in med for signing on desk/ Amiodarone  200mg  qday from bid   Ccm 10 minutes

## 2024-09-01 NOTE — Progress Notes (Signed)
 0. S/p Ahmed/explantation of old implant right eye 06/22/2024 0. S/p Ahmed glaucoma valve, right eye (superonasal) 06/22/2024   8/29: POD1. IOP 6, seidel negative, AC deep, sulcus tube, no choroidals. 07/07/24: POW2. Doing well.  07/28/2024: POM1. Doing well. IOP 12. Will start PF taper once Vicryl sutures removed, probably next appointment. 09/01/24: POW10. 3-4 mm tube exposure. Plan for tube removal and CPC.  -- Stop Prednisolone   -- Continue Timolol  AM OD  -- Continue Lumigan QHS OD  -- Start Oxifloxacin QID OD -- RT OR for removal and CPC+/-ECP  1. Glaucoma evaluation -- Age: 89 y.o. -- Race: White -- Family history: + sister -- Trauma: denies -- Refraction:  Sphere Cylinder Axis Add Horz Prism   Right -1.25 +1.50 015 +3.00 4.0 in  Left -0.75 +0.25 015 +3.00 4.0 in  -- Medical/Medications:   - AMI 10/2020 without significant blockage; med managed  -- Treatment history:   - Glaucoma rx: Lumigan qhs RIGHT EYE; timolol  qam OD     Allergy/intolerance: Brimonidine (red eyes), Trusopt (red eyes), Timolol  (SOB), Rhopressa (no response)  - s/p micropulse 2000 mW 31.3% duty cycle, 120 seconds; OD 04/20/2018 --developed VH POD1  - Hx of vitrectomy right eye for non-clearing [facility] 07/27/18 Dr Laurita  - s/p Ahmed FP7 OD 11/03/2018  - s/p SLT OS 05/27/20 s/p trab with [facility] left eye  01/15/22  S/p CPC right eye 01/15/2022  -- Color plates:   -- TMax: 30 RIGHT EYE  -- IOP: 13:10 -- CCT: 535:503 -- Gonioscopy:   - OD: SS with PAS temp/sup  - OS: SS -- Optic Nerves:   - OD: 0.8  - OS: 0.9 -- OCT RNFL: 04/2024 - OD: AT: 87 (BDL); inf thin, sup BDL, remainder WNL [DA: 1.58] - FLUX - OS: AT: 55 (thin); inf/sup thin, nas/temp WNL [DA: 1.58] - stable vs poss prog  -- HVF: 10/2023  - OD: MD: -21.33; SAS>IAS; GHT ONL (VFI: 35%) - stabilizing  - OS: MD: -16.12; SAS>inf depression; GHT ONL (VFI: 54%) - flux 04/30/2023  - OD: MD -21.54, GHT ONL, VFI 36% - progression  - OS: MD -15.10, GHT ONL, VFI 59% -  stable/flux 12/29/22  - OD: MD -20.61, SAS>IAS, GHT ONL, VFI 39% - progression  - OS: MD -15.18, SAS, GHT ONL, VFI 57% - stable/flux 08/2022  - OD: MD: -20.25; SAS>IAS; GHT ONL (VFI: 41%) - progression  - OS: MD: -15.88; SAS; GHT ONL (VFI: 57%) - stable -- Impression: - Primary Open Angle Glaucoma, OS>OD, severe stage - Initial visit assessment: has multiple drug intolerances and only tolerates lumigan qhs. hx of micropulse resulting in POD1 VH requiring vitrectomy 07/2018. IOP too high for severity of glaucoma. Angle has scattered PAS OD so SLT not a good option. With transillumination, no evidence of ciliary body mass - 11/01/2018: IOP still too high (and even higher than before). Recommend Ahmed FP7 OD. - 11/11/2018: doing well postop. - 11/23/2018: IOP high OD today  - 12/07/2018: see above  - 05/24/2019: IOP doing well.  - 09/27/2019: IOP elevated right eye, has been off timolol  for almost a month.  - 01/31/2020: IOP still up in this eye. HVF may have progressed right eye. IOP too high. Escalate therapy.  Recommend SLT, left eye first.  - 05/27/20: Progression both eyes. left eye>OD. SLT left eye today. RIGHT EYE IOP too high also. Attempted CPC in past, had Ahmed, so if IOP remains high may need second tube.  - 06/07/20: IOP good OU 16:13.  Continue drops, if IOP OD creeps up, consider 2nd tube  - 07/30/2020: IOP doing reasonably well. CPM  - 12/31/2020: Had minor AMI in Jan, no sig blockage though. IOP reasonable. OCT poss prog. CPM - 10/22: IOP too high both eyes. HVF with progression both eyes. Limited options had SLT within last year with brief response, CPC in the past c/b VH. Options: 2nd tube right eye. Primary tube or trab left eye. Bring back in 3 months for repeat HVF. If progression, would aim for CPC right eye and trab left eye (fleischman/sauerzopf) - 12/22/21: IOP way up. HVF with progression both eyes. Long d/w pt re CPC right eye and trab left eye. Very tight orbit for trab and gaze palsy  chronic. Hesitant on CPC right eye.   - 06/12/2022: IOP doing well. Trab doing well.   - 09/15/2022: IOP doing well. HVF with progression right eye, stable left eye. Repeat HVF in 3 months.  - 12/29/2022: IOP doing well. HVF OD with progression. Hx of MI 2 years ago. Recently had severe respiratory infection causing hospitalization and patient went into Afib. Takes lisinopril  and metoprolol , one dose of metoprolol  is at night time. I do think she is stabilizing in both eyes. Repeat HVF in 4 m. - 04/30/2023: IOP doing well, HVF infratemporal progression OD, stable OS. The progression velocity shows continued progression RIGHT EYE, but stable LEFT EYE. Recommend follow up with me in six months, Dr. Laurita in 3-4 months for AMD.  - 11/02/2023: IOP excellent. HVF stabilizing. CPM  - 05/02/2024: IOP excellent. OCT with flux. Significant exposure (4 mm) up to hub of plate. Need to remove tube and replace with SN tube.  - 07/28/2024: IOP excellent.   - 09/01/24: IOP great. -- Plan:  - cont lumigan qhs RIGHT eye  - cont timolol  0.25% qam RIGHT EYE   ~ Surgery for tube removal and replacement, right eye. Risk of surgery includes risk of infection, bleeding, decreased vision or need for further surgery. Not repairing this will significantly increase the risk of infection of the eye. There is the possibility of double vision. Patient understands and elects to proceed.  2. Macular degeneration OU - on AREDS vitamins - follows with Dr. Laurita, last visit 2019  3. Pseudophakia OU -- stable  4. R lateral gaze palsy - chronic  - reports x years - hx of double vision    I saw and evaluated the patient, participating in the key portions of the service.  I reviewed the resident's note.  I agree with the resident's findings and plan. ALM DANAS, MD, MS, FACS

## 2024-09-05 ENCOUNTER — Encounter

## 2024-09-05 NOTE — Progress Notes (Signed)
 Leodis Sawyer, RMA to Me  (Selected Message) TL   09/04/24 11:01 AM Result Note Patient's daughter notified, verbalized understanding. No questions or concerns   CCM 3 mins Dr Jeffie 3 mins   Iron Panel; Vitamin B12; Ferritin; CBC w/auto Differential (3 Part)

## 2024-09-08 DIAGNOSIS — D649 Anemia, unspecified: Secondary | ICD-10-CM | POA: Diagnosis not present

## 2024-09-08 DIAGNOSIS — Z1211 Encounter for screening for malignant neoplasm of colon: Secondary | ICD-10-CM | POA: Diagnosis not present

## 2024-09-19 DIAGNOSIS — E039 Hypothyroidism, unspecified: Secondary | ICD-10-CM | POA: Diagnosis not present

## 2024-09-19 DIAGNOSIS — I10 Essential (primary) hypertension: Secondary | ICD-10-CM | POA: Diagnosis not present

## 2024-09-19 DIAGNOSIS — I4891 Unspecified atrial fibrillation: Secondary | ICD-10-CM | POA: Diagnosis not present

## 2024-09-19 DIAGNOSIS — K219 Gastro-esophageal reflux disease without esophagitis: Secondary | ICD-10-CM | POA: Diagnosis not present

## 2024-09-19 DIAGNOSIS — E7800 Pure hypercholesterolemia, unspecified: Secondary | ICD-10-CM | POA: Diagnosis not present

## 2024-09-19 DIAGNOSIS — J301 Allergic rhinitis due to pollen: Secondary | ICD-10-CM | POA: Diagnosis not present

## 2024-09-19 DIAGNOSIS — D649 Anemia, unspecified: Secondary | ICD-10-CM | POA: Diagnosis not present

## 2024-09-19 DIAGNOSIS — I2510 Atherosclerotic heart disease of native coronary artery without angina pectoris: Secondary | ICD-10-CM | POA: Diagnosis not present

## 2024-09-19 DIAGNOSIS — Z1331 Encounter for screening for depression: Secondary | ICD-10-CM | POA: Diagnosis not present

## 2024-09-27 DIAGNOSIS — E7800 Pure hypercholesterolemia, unspecified: Secondary | ICD-10-CM | POA: Diagnosis not present

## 2024-09-27 DIAGNOSIS — D649 Anemia, unspecified: Secondary | ICD-10-CM | POA: Diagnosis not present

## 2024-10-23 NOTE — Progress Notes (Unsigned)
 "     Electrophysiology Clinic Note    Date:  10/24/2024  Patient ID:  Maria Trujillo, Maria Trujillo [birth year], MRN [id number] PCP:  Jeffie Cheryl BRAVO, MD  Cardiologist:  Marsa Dooms, MD  Electrophysiologist:  Fonda Kitty, MD  Electrophysiology APP:  Shepherd Finnan, NP    Discussed the use of AI scribe software for clinical note transcription with the patient, who gave verbal consent to proceed.   Patient Profile    Chief Complaint: PPM follow-up  History of Present Illness: Maria Trujillo is a 89 y.o. female with PMH notable for persis AFib, SND s/p PPM, PAC, non-obs CAD, HTN, hypothyroid seen today for Fonda Kitty, MD (previously Dr. Cindie) for routine electrophysiology follow-up s/p Pacemaker implant.  She was noted to have syncopal episode with ventricular pauses and is now s/p dual chamber PPM 06/2024. Recovery somewhat complicated by SBO requiring hospitalization, discharged 08/12/2024  On follow-up today, patient states that she almost feels normal after the rough year. She denies chest pain, chest pressure. She believes she had one brief AFib episode in mid-November where she used a heading pad on her shoulder and felt palpitations. She estimates the episode lasted for a couple minutes, no where near as long as her previous aFib episodes in the past.  She has no concerns related to her PPM. She denies dizziness, LH, syncope since PPM implanted.    Arrhythmia/Device History Bos Sci Dual chamber PPM, imp 07/2024; dx SND   AAD Tikosyn  - ineffective Amiodarone  - current    ROS:  Please see the history of present illness. All other systems are reviewed and otherwise negative.    Physical Exam    VS:  BP (!) 120/58 (BP Location: Left Arm, Patient Position: Sitting, Cuff Size: Normal)   Pulse 60   Ht 5' 4 (1.626 m)   Wt 169 lb 6.4 oz (76.8 kg)   SpO2 99%   BMI 29.08 kg/m  BMI: Body mass index is 29.08 kg/m.           Wt Readings from Last 3 Encounters:   10/24/24 169 lb 6.4 oz (76.8 kg)  08/30/24 174 lb (78.9 kg)  08/17/24 161 lb (73 kg)      GEN- The patient is well appearing, alert and oriented x 3 today.   Lungs- Clear to ausculation bilaterally, normal work of breathing.  Heart- Regular rate and rhythm, no murmurs, rubs or gallops Extremities- Trace peripheral edema, warm, dry Skin-  device pocket well-healed, no tethering   Device interrogation done today and reviewed by myself:  Battery 15 years Lead thresholds, impedence, sensing stable  Low VP, 62% AP Brief AF episode 11/15, burden <1% Blunted HR histograms at [facility] Adjusted accelerometer settings Turned on auto capture in atrial lead   Studies Reviewed   Previous EP, cardiology notes.    EKG is ordered. Personal review of EKG from today shows:    EKG Interpretation Date/Time:  Tuesday October 24 2024 13:23:17 EST Ventricular Rate:  60 PR Interval:  254 QRS Duration:  88 QT Interval:  452 QTC Calculation: 452 R Axis:   -9  Text Interpretation: Atrial-paced rhythm with prolonged AV conduction Confirmed by Trusten Hume [phone]) on 10/24/2024 1:30:00 PM    TTE, 06/08/2024 NORMAL LEFT VENTRICULAR SYSTOLIC FUNCTION WITH MILD LVH  ESTIMATED EF: 55%  NORMAL LA PRESSURES WITH DIASTOLIC DYSFUNCTION (GRADE 1)  NORMAL RIGHT VENTRICULAR SYSTOLIC FUNCTION  VALVULAR REGURGITATION: No AR, MILD MR, No PR, MILD TR  ESTIMATED RVSP: 36 mmHg (  Normal)  NO VALVULAR STENOSIS  Left atrium severely dilated.      LHC, 11/04/2020 3rd RPL lesion is 100% stenosed. 1.  Non-ST elevation myocardial infarction 2.  Occluded small caliber RPL 3 3.  Normal left ventricular function   TTE, 11/03/2020  1. Left ventricular ejection fraction, by estimation, is 55 to 60%. The left ventricle has normal function. The left ventricle has no regional wall motion abnormalities. Left ventricular diastolic parameters were normal.   2. Right ventricular systolic function is normal. The right  ventricular size is normal.   3. The mitral valve is normal in structure. Mild to moderate mitral valve regurgitation. No evidence of mitral stenosis.   4. Tricuspid valve regurgitation is mild to moderate.   5. The aortic valve is normal in structure. Aortic valve regurgitation is not visualized. No aortic stenosis is present.   6. The inferior vena cava is normal in size with greater than 50% respiratory variability, suggesting right atrial pressure of 3 mmHg.     Assessment and Plan     #) SND s/p PPM Device functioning well, see paceart for details Battery good Lead measurements stable Low VP HR histograms blunted, adjusted accelerometer settings slightly Discussed long-term follow-up. Patient and daughter requesting [facility] to follow PPM remote monitoring, will enroll her in remote monitoring  #) persis Afib #) amiodarone  Follows with [facility] cardiology Significantly improved AFib burden as of late, <1% burden Tolerating amiodarone  well Recent labs stable         Current medicines are reviewed at length with the patient today.   The patient does not have concerns regarding her medicines.  The following changes were made today:  none  Labs/ tests ordered today include:  Orders Placed This Encounter  Procedures   EKG 12-Lead     Disposition: Follow up with Dr. Kennyth or EP APP in 12 months   Signed, Meigan Pates, NP  10/24/2024  3:53 PM  Electrophysiology [facility] "

## 2024-10-24 ENCOUNTER — Encounter: Payer: Self-pay | Admitting: Cardiology

## 2024-10-24 ENCOUNTER — Ambulatory Visit: Attending: Cardiology | Admitting: Cardiology

## 2024-10-24 VITALS — BP 120/58 | HR 60 | Ht 64.0 in | Wt 169.4 lb

## 2024-10-24 DIAGNOSIS — Z950 Presence of cardiac pacemaker: Secondary | ICD-10-CM | POA: Diagnosis not present

## 2024-10-24 DIAGNOSIS — I495 Sick sinus syndrome: Secondary | ICD-10-CM

## 2024-10-24 LAB — CUP PACEART INCLINIC DEVICE CHECK
Date Time Interrogation Session: 20251230160209
Implantable Lead Connection Status: 753985
Implantable Lead Connection Status: 753985
Implantable Lead Implant Date: 20250929
Implantable Lead Implant Date: 20250929
Implantable Lead Location: 753859
Implantable Lead Location: 753860
Implantable Lead Model: 7840
Implantable Lead Model: 7841
Implantable Lead Serial Number: 1124235
Implantable Lead Serial Number: 1652463
Implantable Pulse Generator Implant Date: 20250929
Lead Channel Pacing Threshold Amplitude: 0.8 V
Lead Channel Pacing Threshold Amplitude: 1 V
Lead Channel Pacing Threshold Pulse Width: 0.4 ms
Lead Channel Pacing Threshold Pulse Width: 0.4 ms
Lead Channel Sensing Intrinsic Amplitude: 15 mV
Lead Channel Sensing Intrinsic Amplitude: 3.4 mV
Lead Channel Setting Pacing Amplitude: 2 V
Lead Channel Setting Pacing Amplitude: 2.5 V
Lead Channel Setting Pacing Pulse Width: 0.4 ms
Lead Channel Setting Sensing Sensitivity: 2.5 mV
Pulse Gen Serial Number: 193698
Zone Setting Status: 755011

## 2024-10-24 NOTE — Patient Instructions (Signed)
 Medication Instructions:  Your physician recommends that you continue on your current medications as directed. Please refer to the Current Medication list given to you today.  *If you need a refill on your cardiac medications before your next appointment, please call your pharmacy*  Lab Work: No labs ordered today    Testing/Procedures: No test ordered today   Follow-Up: At [facility], you and your health needs are our priority.  As part of our continuing mission to provide you with exceptional heart care, our providers are all part of one team.  This team includes your primary Cardiologist (physician) and Advanced Practice Providers or APPs (Physician Assistants and Nurse Practitioners) who all work together to provide you with the care you need, when you need it.  Your next appointment:   1 year(s)  Provider:   Suzann Riddle, NP

## 2024-10-28 ENCOUNTER — Ambulatory Visit: Payer: Self-pay | Admitting: Cardiology

## 2024-12-04 ENCOUNTER — Ambulatory Visit: Admitting: Surgery

## 2024-12-05 ENCOUNTER — Encounter

## 2025-01-24 ENCOUNTER — Ambulatory Visit

## 2025-03-06 ENCOUNTER — Encounter

## 2025-04-25 ENCOUNTER — Ambulatory Visit

## 2025-06-05 ENCOUNTER — Encounter

## 2025-09-04 ENCOUNTER — Encounter
